# Patient Record
Sex: Male | Born: 1960
Health system: Southern US, Community
[De-identification: ages and names within clinical notes are randomized; demographics above are authoritative.]

## PROBLEM LIST (undated history)

## (undated) ENCOUNTER — Emergency Department (HOSPITAL_BASED_OUTPATIENT_CLINIC_OR_DEPARTMENT_OTHER): Payer: Self-pay | Source: Home / Self Care

## (undated) DIAGNOSIS — E119 Type 2 diabetes mellitus without complications: Secondary | ICD-10-CM

## (undated) DIAGNOSIS — I5022 Chronic systolic (congestive) heart failure: Secondary | ICD-10-CM

## (undated) DIAGNOSIS — I214 Non-ST elevation (NSTEMI) myocardial infarction: Secondary | ICD-10-CM

## (undated) DIAGNOSIS — K219 Gastro-esophageal reflux disease without esophagitis: Secondary | ICD-10-CM

## (undated) DIAGNOSIS — Z95810 Presence of automatic (implantable) cardiac defibrillator: Secondary | ICD-10-CM

## (undated) DIAGNOSIS — J189 Pneumonia, unspecified organism: Secondary | ICD-10-CM

## (undated) DIAGNOSIS — K8020 Calculus of gallbladder without cholecystitis without obstruction: Secondary | ICD-10-CM

## (undated) DIAGNOSIS — I10 Essential (primary) hypertension: Secondary | ICD-10-CM

## (undated) DIAGNOSIS — E7800 Pure hypercholesterolemia, unspecified: Secondary | ICD-10-CM

## (undated) DIAGNOSIS — I2510 Atherosclerotic heart disease of native coronary artery without angina pectoris: Secondary | ICD-10-CM

## (undated) DIAGNOSIS — N200 Calculus of kidney: Secondary | ICD-10-CM

---

## 1998-03-21 ENCOUNTER — Emergency Department (HOSPITAL_COMMUNITY): Admission: EM | Admit: 1998-03-21 | Discharge: 1998-03-21 | Payer: Self-pay | Admitting: Emergency Medicine

## 1998-03-22 ENCOUNTER — Emergency Department (HOSPITAL_COMMUNITY): Admission: EM | Admit: 1998-03-22 | Discharge: 1998-03-22 | Payer: Self-pay | Admitting: Emergency Medicine

## 1998-03-23 ENCOUNTER — Emergency Department (HOSPITAL_COMMUNITY): Admission: EM | Admit: 1998-03-23 | Discharge: 1998-03-23 | Payer: Self-pay | Admitting: Emergency Medicine

## 1999-10-04 ENCOUNTER — Emergency Department (HOSPITAL_COMMUNITY): Admission: EM | Admit: 1999-10-04 | Discharge: 1999-10-04 | Payer: Self-pay | Admitting: *Deleted

## 1999-10-13 ENCOUNTER — Emergency Department (HOSPITAL_COMMUNITY): Admission: EM | Admit: 1999-10-13 | Discharge: 1999-10-14 | Payer: Self-pay | Admitting: Emergency Medicine

## 1999-10-13 ENCOUNTER — Encounter: Payer: Self-pay | Admitting: Emergency Medicine

## 1999-10-27 ENCOUNTER — Encounter: Admission: RE | Admit: 1999-10-27 | Discharge: 1999-11-17 | Payer: Self-pay | Admitting: Family Medicine

## 2000-06-20 ENCOUNTER — Encounter: Payer: Self-pay | Admitting: Family Medicine

## 2000-06-20 ENCOUNTER — Ambulatory Visit (HOSPITAL_COMMUNITY): Admission: RE | Admit: 2000-06-20 | Discharge: 2000-06-20 | Payer: Self-pay | Admitting: Family Medicine

## 2000-08-02 DIAGNOSIS — J189 Pneumonia, unspecified organism: Secondary | ICD-10-CM

## 2000-08-02 HISTORY — DX: Pneumonia, unspecified organism: J18.9

## 2000-10-12 ENCOUNTER — Inpatient Hospital Stay (HOSPITAL_COMMUNITY): Admission: EM | Admit: 2000-10-12 | Discharge: 2000-10-14 | Payer: Self-pay | Admitting: Emergency Medicine

## 2000-10-12 ENCOUNTER — Encounter: Payer: Self-pay | Admitting: Emergency Medicine

## 2000-10-14 ENCOUNTER — Encounter: Payer: Self-pay | Admitting: Internal Medicine

## 2001-08-24 ENCOUNTER — Emergency Department (HOSPITAL_COMMUNITY): Admission: EM | Admit: 2001-08-24 | Discharge: 2001-08-24 | Payer: Self-pay

## 2003-04-21 ENCOUNTER — Emergency Department (HOSPITAL_COMMUNITY): Admission: EM | Admit: 2003-04-21 | Discharge: 2003-04-21 | Payer: Self-pay | Admitting: Emergency Medicine

## 2005-07-04 ENCOUNTER — Emergency Department (HOSPITAL_COMMUNITY): Admission: EM | Admit: 2005-07-04 | Discharge: 2005-07-04 | Payer: Self-pay | Admitting: Diagnostic Radiology

## 2008-05-21 ENCOUNTER — Emergency Department (HOSPITAL_COMMUNITY): Admission: EM | Admit: 2008-05-21 | Discharge: 2008-05-21 | Payer: Self-pay | Admitting: Emergency Medicine

## 2011-05-03 LAB — SEDIMENTATION RATE: Sed Rate: 3

## 2011-05-03 LAB — BASIC METABOLIC PANEL
BUN: 22
Chloride: 101
Creatinine, Ser: 0.85
GFR calc Af Amer: >60
GFR calc non Af Amer: >60
Potassium: 5.2 — ABNORMAL HIGH

## 2011-05-03 LAB — GLUCOSE, CAPILLARY
Glucose-Capillary: 251 — ABNORMAL HIGH
Glucose-Capillary: 261 — ABNORMAL HIGH

## 2012-12-30 ENCOUNTER — Emergency Department (HOSPITAL_BASED_OUTPATIENT_CLINIC_OR_DEPARTMENT_OTHER): Payer: Self-pay

## 2012-12-30 ENCOUNTER — Emergency Department (HOSPITAL_BASED_OUTPATIENT_CLINIC_OR_DEPARTMENT_OTHER)
Admission: EM | Admit: 2012-12-30 | Discharge: 2012-12-30 | Disposition: A | Discharge status: Home / Self Care | Payer: Self-pay | Attending: Emergency Medicine | Admitting: Emergency Medicine

## 2012-12-30 ENCOUNTER — Encounter (HOSPITAL_BASED_OUTPATIENT_CLINIC_OR_DEPARTMENT_OTHER): Payer: Self-pay | Admitting: Emergency Medicine

## 2012-12-30 DIAGNOSIS — J189 Pneumonia, unspecified organism: Secondary | ICD-10-CM

## 2012-12-30 DIAGNOSIS — R066 Hiccough: Secondary | ICD-10-CM

## 2012-12-30 DIAGNOSIS — R059 Cough, unspecified: Secondary | ICD-10-CM | POA: Insufficient documentation

## 2012-12-30 DIAGNOSIS — J3489 Other specified disorders of nose and nasal sinuses: Secondary | ICD-10-CM | POA: Insufficient documentation

## 2012-12-30 DIAGNOSIS — E119 Type 2 diabetes mellitus without complications: Secondary | ICD-10-CM | POA: Insufficient documentation

## 2012-12-30 DIAGNOSIS — J309 Allergic rhinitis, unspecified: Secondary | ICD-10-CM | POA: Insufficient documentation

## 2012-12-30 DIAGNOSIS — E7800 Pure hypercholesterolemia, unspecified: Secondary | ICD-10-CM | POA: Insufficient documentation

## 2012-12-30 DIAGNOSIS — I10 Essential (primary) hypertension: Secondary | ICD-10-CM | POA: Insufficient documentation

## 2012-12-30 DIAGNOSIS — R05 Cough: Secondary | ICD-10-CM | POA: Insufficient documentation

## 2012-12-30 DIAGNOSIS — Z79899 Other long term (current) drug therapy: Secondary | ICD-10-CM | POA: Insufficient documentation

## 2012-12-30 DIAGNOSIS — Z7982 Long term (current) use of aspirin: Secondary | ICD-10-CM | POA: Insufficient documentation

## 2012-12-30 HISTORY — DX: Pure hypercholesterolemia, unspecified: E78.00

## 2012-12-30 HISTORY — DX: Essential (primary) hypertension: I10

## 2012-12-30 MED ORDER — AZITHROMYCIN 250 MG PO TABS: 250.0000 mg | ORAL_TABLET | Freq: Every day | ORAL | Status: DC

## 2012-12-30 MED ORDER — CHLORPROMAZINE HCL 25 MG PO TABS: 25.0000 mg | ORAL_TABLET | Freq: Three times a day (TID) | ORAL | Status: DC

## 2012-12-30 NOTE — ED Notes (Signed)
Pt c/o hiccups x 4 days with episodes lasting 5+ hours at a time. Pt reports saw Dr. Lovell Sheehan and was told to come here for X-ray.

## 2012-12-30 NOTE — ED Provider Notes (Addendum)
History  This chart was scribed for Benjamin Lyons, MD by Ardelia Mems, ED Scribe. This patient was seen in room MH07/MH07 and the patient's care was started at 4:25 PM.   CSN: [id number]  Arrival date & time 12/30/12  1519      Chief Complaint  Patient presents with  . Hiccups     The history is provided by the patient. No language interpreter was used.    HPI Comments: Benjamin [name] is a 52 y.o. male with a h/o DM and HTN who presents to the Emergency Department complaining of 4 days of intermittent, moderate episodes of hiccups which have worsened in the past 24 hours. Episodes occur constantly for about 4-5 hours and go away for 6-7 hours. Pt states that the hiccups wake him in the morning on occasion. Pt states that eating and exercising have caused the hiccups to go away temporarily. Pt states that his food which he ate earlier today feels like it is still in his thoat. Pt states that he has also been having a moderate cough and seasonal allergies. Pt denies any past history of smoking or drinking. Pt reports saw Dr. Lovell Sheehan and was told to come here for X-ray.     Past Medical History  Diagnosis Date  . Hypertension   . Diabetes mellitus without complication   . High cholesterol     History reviewed. No pertinent past surgical history.  No family history on file.  History  Substance Use Topics  . Smoking status: Never Smoker   . Smokeless tobacco: Not on file  . Alcohol Use: No      Review of Systems  Constitutional: Negative for fever and chills.  HENT: Positive for congestion and rhinorrhea.   Respiratory: Positive for cough. Negative for choking and shortness of breath.   Cardiovascular: Negative for chest pain.  Gastrointestinal: Negative for vomiting and diarrhea.  All other systems reviewed and are negative.    Allergies  Review of patient's allergies indicates no known allergies.  Home Medications   Current Outpatient Rx  Name  Route  Sig  Dispense   Refill  . Albuterol (PROVENTIL IN)   Inhalation   Inhale into the lungs.         Marland Kitchen aspirin 81 MG tablet   Oral   Take 81 mg by mouth daily.         Marland Kitchen atorvastatin (LIPITOR) 20 MG tablet   Oral   Take 20 mg by mouth daily.         . carvedilol (COREG) 25 MG tablet   Oral   Take 25 mg by mouth 2 (two) times daily with a meal.         . desloratadine (CLARINEX) 5 MG tablet   Oral   Take 5 mg by mouth daily.         Marland Kitchen gabapentin (NEURONTIN) 300 MG capsule   Oral   Take 300 mg by mouth at bedtime.         Marland Kitchen ibuprofen (ADVIL,MOTRIN) 800 MG tablet   Oral   Take 800 mg by mouth every 8 (eight) hours as needed for pain.         . Investigational everolimus (RAD001) 5 MG tablet Novartis [id number]   Oral   Take 2 tablets by mouth daily. Take with a glass of water.         . valsartan (DIOVAN) 160 MG tablet   Oral   Take 160 mg by mouth  2 (two) times daily.           There were no vitals taken for this visit.  Physical Exam  Nursing note and vitals reviewed. Constitutional: He is oriented to person, place, and time. He appears well-developed and well-nourished.  HENT:  Head: Normocephalic and atraumatic.  Mouth/Throat: Oropharynx is clear and moist.  Eyes: EOM are normal. Pupils are equal, round, and reactive to light.  Neck: Normal range of motion. No tracheal deviation present.  Cardiovascular: Normal rate, regular rhythm and normal heart sounds.   Pulmonary/Chest: Effort normal and breath sounds normal. No respiratory distress.  Abdominal: Soft. There is no tenderness.  Musculoskeletal: Normal range of motion. He exhibits no tenderness.  Neurological: He is alert and oriented to person, place, and time.  Skin: Skin is warm. No rash noted.  Psychiatric: He has a normal mood and affect.    ED Course  Procedures (including critical care time)  DIAGNOSTIC STUDIES:  COORDINATION OF CARE: 4:31 PM- Pt advised of plan for treatment and pt  agrees.     Labs Reviewed - No data to display Dg Chest 2 View  12/30/2012   *RADIOLOGY REPORT*  Clinical Data: Hiccups  CHEST - 2 VIEW  Comparison: None.  Findings: Mild patchy opacity in the left mid lung, pneumonia not excluded.  No pleural effusion or pneumothorax.  Cardiomegaly.  Mild degenerative changes of the visualized thoracolumbar spine.  IMPRESSION: Mild patchy opacity in the left mid lung, pneumonia not excluded.   Original Report Authenticated By: Charline Bills, M.D.     1. Hiccups   2. Community acquired pneumonia       MDM  Patient with persistent hiccups for four days.  Chest xray is suggestive of pneumonia and will treat with antibiotics and trazodone.  If he does not improve he is to follow up with his pcp (Dr. Lovell Sheehan) to discuss a chest ct.  To return to the ED prn.          I personally performed the services described in this documentation, which was scribed in my presence. The recorded information has been reviewed and is accurate.      Benjamin Lyons, MD 12/31/12 [id number]  Benjamin Lyons, MD 12/31/12 [phone]

## 2013-04-09 ENCOUNTER — Ambulatory Visit: Payer: No Typology Code available for payment source | Attending: Family Medicine | Admitting: Internal Medicine

## 2013-04-09 ENCOUNTER — Encounter: Payer: Self-pay | Admitting: Internal Medicine

## 2013-04-09 VITALS — BP 166/108 | HR 88 | Temp 98.7°F | Resp 16 | Ht 67.72 in | Wt 246.0 lb

## 2013-04-09 DIAGNOSIS — E785 Hyperlipidemia, unspecified: Secondary | ICD-10-CM

## 2013-04-09 DIAGNOSIS — Z0000 Encounter for general adult medical examination without abnormal findings: Secondary | ICD-10-CM | POA: Insufficient documentation

## 2013-04-09 DIAGNOSIS — E119 Type 2 diabetes mellitus without complications: Secondary | ICD-10-CM | POA: Insufficient documentation

## 2013-04-09 DIAGNOSIS — K5900 Constipation, unspecified: Secondary | ICD-10-CM | POA: Insufficient documentation

## 2013-04-09 DIAGNOSIS — I10 Essential (primary) hypertension: Secondary | ICD-10-CM

## 2013-04-09 DIAGNOSIS — Z23 Encounter for immunization: Secondary | ICD-10-CM

## 2013-04-09 MED ORDER — SENNA 8.6 MG PO TABS: 2.0000 | ORAL_TABLET | Freq: Every day | ORAL | Status: DC

## 2013-04-09 MED ORDER — INSULIN ASPART 100 UNIT/ML ~~LOC~~ SOLN: 15.0000 [IU] | Freq: Two times a day (BID) | SUBCUTANEOUS | Status: DC

## 2013-04-09 MED ORDER — VALSARTAN 160 MG PO TABS: 320.0000 mg | ORAL_TABLET | Freq: Every day | ORAL | Status: DC

## 2013-04-09 MED ORDER — POLYETHYLENE GLYCOL 3350 17 GM/SCOOP PO POWD: 17.0000 g | Freq: Two times a day (BID) | ORAL | Status: DC | PRN

## 2013-04-09 MED ORDER — INSULIN GLARGINE 100 UNIT/ML ~~LOC~~ SOLN: 25.0000 [IU] | Freq: Every day | SUBCUTANEOUS | Status: DC

## 2013-04-09 MED ORDER — CARVEDILOL 25 MG PO TABS: 25.0000 mg | ORAL_TABLET | Freq: Two times a day (BID) | ORAL | Status: DC

## 2013-04-09 MED ORDER — GABAPENTIN 300 MG PO CAPS: 300.0000 mg | ORAL_CAPSULE | Freq: Every day | ORAL | Status: DC

## 2013-04-09 MED ORDER — ATORVASTATIN CALCIUM 20 MG PO TABS: 20.0000 mg | ORAL_TABLET | Freq: Every day | ORAL | Status: DC

## 2013-04-09 NOTE — Progress Notes (Signed)
Patient Demographics  Benjamin [name], is a 52 y.o. male  HQI:[id number]  XLK:[id number]  DOB - 06/11/1961  Chief Complaint  Patient presents with  . Establish Care        Subjective:   [name] today is here to establish primary care. Patient has a past medical history of hypertension, diabetes and dyslipidemia. He is here to establish medical care. His main complaint here today is of constipation but he has been having for the past 10 days. Currently patient has no other complaints complaints. Patient has also has No headache, No chest pain, No abdominal pain,No Nausea, No new weakness tingling or numbness, No Cough or SOB.  Objective:    Filed Vitals:   04/09/13 1747  BP: 166/108  Pulse: 88  Temp: 98.7 F (37.1 C)  TempSrc: Oral  Resp: 16  Height: 5' 7.72" (1.72 m)  Weight: 246 lb (111.585 kg)  SpO2: 96%     ALLERGIES:  No Known Allergies  PAST MEDICAL HISTORY: Past Medical History  Diagnosis Date  . Hypertension   . Diabetes mellitus without complication   . High cholesterol     PAST SURGICAL HISTORY: History reviewed. No pertinent past surgical history.  FAMILY HISTORY: No family history on file.  MEDICATIONS AT HOME: Prior to Admission medications   Medication Sig Start Date End Date Taking? Authorizing Provider  Albuterol (PROVENTIL IN) Inhale into the lungs.    Historical Provider, MD  aspirin 81 MG tablet Take 81 mg by mouth daily.    Historical Provider, MD  atorvastatin (LIPITOR) 20 MG tablet Take 1 tablet (20 mg total) by mouth daily. 04/09/13   Carrine Kroboth Levora Dredge, MD  azithromycin (ZITHROMAX) 250 MG tablet Take 1 tablet (250 mg total) by mouth daily. Take first 2 tablets together, then 1 every day until finished. 12/30/12   [name], MD  carvedilol (COREG) 25 MG tablet Take 1 tablet (25 mg total) by mouth 2 (two) times daily with a meal. 04/09/13   Maretta Bees, MD  gabapentin (NEURONTIN) 300 MG capsule Take 1 capsule (300 mg total) by mouth  at bedtime. 04/09/13   Carrson Lightcap Levora Dredge, MD  insulin aspart (NOVOLOG) 100 UNIT/ML injection Inject 15 Units into the skin 2 (two) times daily with a meal. 04/09/13   Maretta Bees, MD  insulin glargine (LANTUS) 100 UNIT/ML injection Inject 0.25 mLs (25 Units total) into the skin at bedtime. 04/09/13   Gailene Youkhana Levora Dredge, MD  valsartan (DIOVAN) 160 MG tablet Take 2 tablets (320 mg total) by mouth daily. 04/09/13   Shakeitha Umbaugh Levora Dredge, MD    SOCIAL HISTORY:   reports that he has never smoked. He does not have any smokeless tobacco history on file. He reports that he does not drink alcohol or use illicit drugs.  REVIEW OF SYSTEMS:  Constitutional:   No   Fevers, chills, fatigue.  HEENT:    No headaches, Sore throat,   Cardio-vascular: No chest pain,  Orthopnea, swelling in lower extremities, anasarca, palpitations  GI:  No abdominal pain, nausea, vomiting, diarrhea  Resp: No shortness of breath,  No coughing up of blood.No cough.No wheezing.  Skin:  no rash or lesions.  GU:  no dysuria, change in color of urine, no urgency or frequency.  No flank pain.  Musculoskeletal: No joint pain or swelling.  No decreased range of motion.  No back pain.  Psych: No change in mood or affect. No depression or anxiety.  No memory loss.   Exam  General appearance :Awake, alert, not in any distress. Speech Clear. Not toxic Looking HEENT: Atraumatic and Normocephalic, pupils equally reactive to light and accomodation Neck: supple, no JVD. No cervical lymphadenopathy.  Chest:Good air entry bilaterally, no added sounds  CVS: S1 S2 regular, no murmurs.  Abdomen: Bowel sounds present, Non tender and not distended with no gaurding, rigidity or rebound. Extremities: B/L Lower Ext shows no edema, both legs are warm to touch Neurology: Awake alert, and oriented X 3, CN II-XII intact, Non focal Skin:No Rash Wounds:N/A    Data Review   CBC No results found for this basename: WBC, HGB, HCT, PLT,  MCV, MCH, MCHC, RDW, NEUTRABS, LYMPHSABS, MONOABS, EOSABS, BASOSABS, BANDABS, BANDSABD,  in the last 168 hours  Chemistries   No results found for this basename: NA, K, CL, CO2, GLUCOSE, BUN, CREATININE, GFRCGP, CALCIUM, MG, AST, ALT, ALKPHOS, BILITOT,  in the last 168 hours ------------------------------------------------------------------------------------------------------------------ No results found for this basename: HGBA1C,  in the last 72 hours ------------------------------------------------------------------------------------------------------------------ No results found for this basename: CHOL, HDL, LDLCALC, TRIG, CHOLHDL, LDLDIRECT,  in the last 72 hours ------------------------------------------------------------------------------------------------------------------ No results found for this basename: TSH, T4TOTAL, FREET3, T3FREE, THYROIDAB,  in the last 72 hours ------------------------------------------------------------------------------------------------------------------ No results found for this basename: VITAMINB12, FOLATE, FERRITIN, TIBC, IRON, RETICCTPCT,  in the last 72 hours  Coagulation profile  No results found for this basename: INR, PROTIME,  in the last 168 hours    Assessment & Plan   Hypertension - Uncontrolled - Change Diovan to 320 mg daily, change to 25 mg twice a day, continue HCTZ 25 mg daily - Reassess at next visit  Diabetes - Continue with Lantus 25 units each bedtime, and 15 units twice a day with meals - Check A1c -  patient to bring CBG diary next visit  Dyslipidemia - Check lipid panel, continue Lipitor  Constipation - Senokot each bedtime, and MiraLax when necessary - Reassess next visit  Health Maintenance -Colonoscopy: Ordered today -Vaccinations: Flu vaccine today  Follow in 2 weeks-please followup labs-CBC, cemented, TSH, A1c and lipid panel  The patient was given clear instructions to go to ER or return to medical center  if symptoms don't improve, worsen or new problems develop. The patient verbalized understanding. The patient was told to call to get lab results if they haven't heard anything in the next week.

## 2013-04-09 NOTE — Progress Notes (Signed)
PT IS TO COME BACK IN A WEEK FOR LABS AND A WEEK AFTER TO REVIEW THE LAB RESULTS.

## 2013-04-09 NOTE — Progress Notes (Signed)
Pt is here to establish care. For 10 days pt is having trouble having bowl moments. Pt reports not having a feeling to go to the bathroom. When he thinks he needs to go he just passes gas. Pt is taking magnesium to help but does not help much.

## 2013-04-10 ENCOUNTER — Ambulatory Visit: Payer: No Typology Code available for payment source | Attending: Internal Medicine

## 2013-04-10 ENCOUNTER — Other Ambulatory Visit: Payer: No Typology Code available for payment source

## 2013-04-10 VITALS — BP 137/87 | HR 67 | Temp 98.4°F | Resp 16 | Ht 67.32 in | Wt 242.0 lb

## 2013-04-10 DIAGNOSIS — E119 Type 2 diabetes mellitus without complications: Secondary | ICD-10-CM | POA: Insufficient documentation

## 2013-04-10 LAB — CBC
MCH: 26.8 pg (ref 26.0–34.0)
MCHC: 34.7 g/dL (ref 30.0–36.0)
Platelets: 193 10*3/uL (ref 150–400)

## 2013-04-10 LAB — HEMOGLOBIN A1C
Hgb A1c MFr Bld: 8.7 % — ABNORMAL HIGH (ref <5.7)
Mean Plasma Glucose: 203 mg/dL — ABNORMAL HIGH (ref <117)

## 2013-04-10 NOTE — Progress Notes (Unsigned)
Pt is here for labs only.

## 2013-04-11 LAB — COMPREHENSIVE METABOLIC PANEL
ALT: 27 U/L (ref 0–53)
AST: 18 U/L (ref 0–37)
Albumin: 4.1 g/dL (ref 3.5–5.2)
CO2: 28 mEq/L (ref 19–32)
Calcium: 9.5 mg/dL (ref 8.4–10.5)
Chloride: 103 mEq/L (ref 96–112)
Creat: 1 mg/dL (ref 0.50–1.35)
Potassium: 4.7 mEq/L (ref 3.5–5.3)
Sodium: 141 mEq/L (ref 135–145)
Total Protein: 6.5 g/dL (ref 6.0–8.3)

## 2013-04-11 LAB — LIPID PANEL
Cholesterol: 150 mg/dL (ref 0–200)
Triglycerides: 64 mg/dL (ref <150)

## 2013-04-16 ENCOUNTER — Other Ambulatory Visit: Payer: No Typology Code available for payment source

## 2013-04-18 ENCOUNTER — Encounter: Payer: Self-pay | Admitting: Cardiology

## 2013-04-18 ENCOUNTER — Ambulatory Visit: Payer: No Typology Code available for payment source | Attending: Cardiology | Admitting: Cardiology

## 2013-04-18 VITALS — BP 153/93 | HR 74 | Temp 98.7°F | Resp 16 | Ht 67.0 in | Wt 243.0 lb

## 2013-04-18 DIAGNOSIS — I10 Essential (primary) hypertension: Secondary | ICD-10-CM | POA: Insufficient documentation

## 2013-04-18 DIAGNOSIS — E119 Type 2 diabetes mellitus without complications: Secondary | ICD-10-CM | POA: Insufficient documentation

## 2013-04-18 DIAGNOSIS — E785 Hyperlipidemia, unspecified: Secondary | ICD-10-CM | POA: Insufficient documentation

## 2013-04-18 DIAGNOSIS — K5900 Constipation, unspecified: Secondary | ICD-10-CM

## 2013-04-18 DIAGNOSIS — Z794 Long term (current) use of insulin: Secondary | ICD-10-CM | POA: Insufficient documentation

## 2013-04-18 DIAGNOSIS — Z79899 Other long term (current) drug therapy: Secondary | ICD-10-CM | POA: Insufficient documentation

## 2013-04-18 DIAGNOSIS — E1165 Type 2 diabetes mellitus with hyperglycemia: Secondary | ICD-10-CM | POA: Insufficient documentation

## 2013-04-18 MED ORDER — METFORMIN HCL 1000 MG PO TABS: 1000.0000 mg | ORAL_TABLET | Freq: Two times a day (BID) | ORAL | Status: DC

## 2013-04-18 NOTE — Assessment & Plan Note (Signed)
Continue stool softener. Continue exercise. Better blood sugar control will improve bowel function. We will schedule for screening colonoscopy since he's 52 years of age.

## 2013-04-18 NOTE — Progress Notes (Signed)
PT ORDERED METFORMIN  3 MNTH F/U WITH PCP GIVEN COLONOSCOPY SCHEDULED

## 2013-04-18 NOTE — Progress Notes (Signed)
Mr Cundari returns today for followup of his inadequately controlled blood pressure, insulin-dependent diabetes, hyperlipidemia. Please see previous note for adjustments made.  His blood work was remarkable for a hemoglobin A1c of 8.7%. Lipids were at goal. Other blood work was unremarkable.  He has been  taking his medications for blood pressure the way he wants to take them as opposed to whether prescribed. Specifically, he is dividing his Diovan dose into twice a day. He is also taking only a half a tablet of carvedilol instead of a full tablet. He is also taking his hydrochlorothiazide at night instead of the morning.  His diet is very high in carbohydrates with rice and bread. He does exercise 3 times a week. He denies any symptoms of angina or ischemia.  His physical examination is unchanged.  No other ancillary studies ordered today.

## 2013-04-18 NOTE — Progress Notes (Signed)
Patient here f/u HTN,DIABETES TAKING PRESCRIBED MEDS AND INSULIN DENIES CP OR SOB CONSTIPATION

## 2013-04-18 NOTE — Assessment & Plan Note (Signed)
I have reviewed at length how he should appropriately take his medications. His blood pressures better today but still not adequate control. I think this is because he's not taking them as prescribed. He will continue exercise 3 times a week and try to lose weight by decreasing his carbohydrate intake. This hopefully will also help his hemoglobin A1c which we'll recheck in 3 months.

## 2013-04-18 NOTE — Assessment & Plan Note (Signed)
Continue Lipitor as prescribed. Repeat labs in a year. He is at goal with his LDL.

## 2013-04-19 ENCOUNTER — Telehealth: Payer: Self-pay | Admitting: Emergency Medicine

## 2013-04-19 NOTE — Telephone Encounter (Signed)
PT LEFT MESSAGE ON VOICEMAIL THAT NORA WILL CALL WITH APPT FOR COLONOSCOPY.

## 2013-04-23 ENCOUNTER — Ambulatory Visit: Payer: No Typology Code available for payment source | Admitting: Internal Medicine

## 2013-04-27 ENCOUNTER — Telehealth: Payer: Self-pay | Admitting: Internal Medicine

## 2013-04-27 NOTE — Telephone Encounter (Signed)
Medication not on pt file. Will need to schedule appt.

## 2013-07-02 ENCOUNTER — Encounter: Payer: Self-pay | Admitting: Internal Medicine

## 2013-07-10 ENCOUNTER — Encounter: Payer: Self-pay | Admitting: Cardiology

## 2013-07-11 ENCOUNTER — Other Ambulatory Visit: Payer: Self-pay | Admitting: Gastroenterology

## 2013-07-11 NOTE — Addendum Note (Signed)
Addended by: Charlott Rakes on: 07/11/2013 04:06 PM   Modules accepted: Orders

## 2013-07-12 ENCOUNTER — Encounter (HOSPITAL_COMMUNITY)
Admission: RE | Disposition: A | Discharge status: Home / Self Care | Payer: Self-pay | Source: Ambulatory Visit | Attending: Gastroenterology

## 2013-07-12 ENCOUNTER — Encounter (HOSPITAL_COMMUNITY): Payer: Self-pay

## 2013-07-12 ENCOUNTER — Ambulatory Visit (HOSPITAL_COMMUNITY)
Admission: RE | Admit: 2013-07-12 | Discharge: 2013-07-12 | Disposition: A | Discharge status: Home / Self Care | Payer: No Typology Code available for payment source | Source: Ambulatory Visit | Attending: Gastroenterology | Admitting: Gastroenterology

## 2013-07-12 DIAGNOSIS — K648 Other hemorrhoids: Secondary | ICD-10-CM | POA: Insufficient documentation

## 2013-07-12 DIAGNOSIS — R194 Change in bowel habit: Secondary | ICD-10-CM | POA: Diagnosis present

## 2013-07-12 DIAGNOSIS — K5900 Constipation, unspecified: Secondary | ICD-10-CM | POA: Insufficient documentation

## 2013-07-12 DIAGNOSIS — R198 Other specified symptoms and signs involving the digestive system and abdomen: Secondary | ICD-10-CM | POA: Insufficient documentation

## 2013-07-12 HISTORY — PX: COLONOSCOPY: SHX5424

## 2013-07-12 LAB — GLUCOSE, CAPILLARY: Glucose-Capillary: 93 mg/dL (ref 70–99)

## 2013-07-12 SURGERY — COLONOSCOPY
Anesthesia: Moderate Sedation

## 2013-07-12 MED ORDER — MIDAZOLAM HCL 10 MG/2ML IJ SOLN
INTRAMUSCULAR | Status: DC | PRN
  Administered 2013-07-12 (×2): 2 mg via INTRAVENOUS

## 2013-07-12 MED ORDER — FENTANYL CITRATE 0.05 MG/ML IJ SOLN
INTRAMUSCULAR | Status: AC
  Filled 2013-07-12: qty 2

## 2013-07-12 MED ORDER — MIDAZOLAM HCL 10 MG/2ML IJ SOLN
INTRAMUSCULAR | Status: AC
  Filled 2013-07-12: qty 2

## 2013-07-12 MED ORDER — DIPHENHYDRAMINE HCL 50 MG/ML IJ SOLN
INTRAMUSCULAR | Status: AC
  Filled 2013-07-12: qty 1

## 2013-07-12 MED ORDER — SODIUM CHLORIDE 0.9 % IV SOLN
INTRAVENOUS | Status: DC
  Administered 2013-07-12: 09:00:00 via INTRAVENOUS

## 2013-07-12 MED ORDER — FENTANYL CITRATE 0.05 MG/ML IJ SOLN
INTRAMUSCULAR | Status: DC | PRN
  Administered 2013-07-12 (×2): 25 ug via INTRAVENOUS

## 2013-07-12 NOTE — Op Note (Signed)
[facility] [address] [place], [address]   COLONOSCOPY PROCEDURE REPORT  PATIENT: Benjamin, [name]  MR#: [id number] BIRTHDATE: 10-12-1960 , 52  yrs. old GENDER: Male ENDOSCOPIST: Charlott Rakes, MD REFERRED BY: PROCEDURE DATE:  07/12/2013 PROCEDURE:   Colonoscopy, diagnostic ASA CLASS:   Class II INDICATIONS:Change in bowel habits and Constipation. MEDICATIONS: Fentanyl 50 mcg IV and Versed 5 mg IV  DESCRIPTION OF PROCEDURE:   After the risks benefits and alternatives of the procedure were thoroughly explained, informed consent was obtained.  The Pentax Ped Colon [id number]  endoscope was introduced through the anus and advanced to the cecum, which was identified by both the appendix and ileocecal valve , limited by No adverse events experienced.   The quality of the prep was    .  The instrument was then slowly withdrawn as the colon was fully examined.     FINDINGS:  Rectal exam unremarkable.  Pediatric colonoscope inserted into the colon and advanced to the cecum, where the appendiceal orifice and ileocecal valve were identified.  The terminal ileum was intubated and was normal in appearance.  On careful withdrawal of the colonoscope no mucosal abnormalities were seen. Retroflexion revealed small internal hemorrhoids.  COMPLICATIONS: None  IMPRESSION:     Small internal hemorrhoids o/w normal colonoscopy   RECOMMENDATIONS: Repeat colonoscopy in 10 years; Miralax QD prn    ______________________________ eSignedCharlott Rakes, MD 07/12/2013 10:15 AM   CC:

## 2013-07-12 NOTE — Interval H&P Note (Signed)
History and Physical Interval Note:  07/12/2013 9:47 AM  Benjamin [name]  has presented today for surgery, with the diagnosis of change in bowel  The various methods of treatment have been discussed with the patient and family. After consideration of risks, benefits and other options for treatment, the patient has consented to  Procedure(s): COLONOSCOPY (N/A) as a surgical intervention .  The patient's history has been reviewed, patient examined, no change in status, stable for surgery.  I have reviewed the patient's chart and labs.  Questions were answered to the patient's satisfaction.     [name] C.

## 2013-07-12 NOTE — Interval H&P Note (Signed)
History and Physical Interval Note:  07/12/2013 9:47 AM  Benjamin [name]  has presented today for surgery, with the diagnosis of change in bowel  The various methods of treatment have been discussed with the patient and family. After consideration of risks, benefits and other options for treatment, the patient has consented to  Procedure(s): COLONOSCOPY (N/A) as a surgical intervention .  The patient's history has been reviewed, patient examined, no change in status, stable for surgery.  I have reviewed the patient's chart and labs.  Questions were answered to the patient's satisfaction.     King Pinzon C.   

## 2013-07-12 NOTE — H&P (Signed)
  Date of Initial H&P: 07/06/13  History reviewed, patient examined, no change in status, stable for surgery. 

## 2013-07-13 ENCOUNTER — Encounter (HOSPITAL_COMMUNITY): Payer: Self-pay | Admitting: Gastroenterology

## 2013-07-31 ENCOUNTER — Ambulatory Visit: Payer: No Typology Code available for payment source | Attending: Internal Medicine

## 2013-07-31 DIAGNOSIS — E1110 Type 2 diabetes mellitus with ketoacidosis without coma: Secondary | ICD-10-CM

## 2013-07-31 DIAGNOSIS — E1310 Other specified diabetes mellitus with ketoacidosis without coma: Secondary | ICD-10-CM

## 2013-07-31 LAB — LIPID PANEL
Cholesterol: 153 mg/dL (ref 0–200)
HDL: 32 mg/dL — ABNORMAL LOW (ref >39)
LDL Cholesterol: 99 mg/dL (ref 0–99)
VLDL: 22 mg/dL (ref 0–40)

## 2013-07-31 LAB — POCT GLYCOSYLATED HEMOGLOBIN (HGB A1C): Hemoglobin A1C: 7.2

## 2013-07-31 NOTE — Progress Notes (Signed)
Pt here for INR

## 2013-08-07 ENCOUNTER — Ambulatory Visit: Payer: No Typology Code available for payment source | Attending: Internal Medicine | Admitting: Internal Medicine

## 2013-08-07 ENCOUNTER — Encounter: Payer: Self-pay | Admitting: Internal Medicine

## 2013-08-07 VITALS — BP 124/82 | HR 74 | Temp 98.4°F | Resp 16 | Ht 68.0 in | Wt 241.0 lb

## 2013-08-07 DIAGNOSIS — E785 Hyperlipidemia, unspecified: Secondary | ICD-10-CM | POA: Insufficient documentation

## 2013-08-07 DIAGNOSIS — K219 Gastro-esophageal reflux disease without esophagitis: Secondary | ICD-10-CM

## 2013-08-07 DIAGNOSIS — E1110 Type 2 diabetes mellitus with ketoacidosis without coma: Secondary | ICD-10-CM | POA: Insufficient documentation

## 2013-08-07 DIAGNOSIS — I10 Essential (primary) hypertension: Secondary | ICD-10-CM | POA: Insufficient documentation

## 2013-08-07 DIAGNOSIS — E1310 Other specified diabetes mellitus with ketoacidosis without coma: Secondary | ICD-10-CM

## 2013-08-07 DIAGNOSIS — E119 Type 2 diabetes mellitus without complications: Secondary | ICD-10-CM | POA: Insufficient documentation

## 2013-08-07 LAB — GLUCOSE, POCT (MANUAL RESULT ENTRY): POC Glucose: 231 mg/dl — AB (ref 70–99)

## 2013-08-07 MED ORDER — ESOMEPRAZOLE MAGNESIUM 40 MG PO CPDR: 40.0000 mg | DELAYED_RELEASE_CAPSULE | Freq: Every day | ORAL | Status: DC

## 2013-08-07 NOTE — Progress Notes (Signed)
Patient ID: Benjamin [name], male   DOB: 1960-11-07, 53 y.o.   MRN: [id number] Patient Demographics  Benjamin [name], is a 53 y.o. male  FGH:[id number]  CVE:[id number]  DOB - 10/08/1960  Chief Complaint  Patient presents with  . Follow-up        Subjective:   Benjamin [name] is a 53 y.o. male here today for a follow up visit. Patient has no complaint. He has all his medications but he takes the medications the way he wants and not the way they're prescribed, for example he takes his Benicar half tablet in the morning and half in the evening, and carvedilol he takes occasionally once a day and sometimes he said he does not take it because it think he doesn't need it especially when his blood pressure is normal at home. His lipid profile is at goal, his blood pressure is controlled today, blood sugar is high above 200. Patient does not smoke cigarette, he does not drink alcohol. He claims he does regular exercise at least 3 times a week. Patient has No headache, No chest pain, No abdominal pain - No Nausea, No new weakness tingling or numbness, No Cough - SOB.  ALLERGIES: No Known Allergies  PAST MEDICAL HISTORY: Past Medical History  Diagnosis Date  . Hypertension   . Diabetes mellitus without complication   . High cholesterol     MEDICATIONS AT HOME: Prior to Admission medications   Medication Sig Start Date End Date Taking? Authorizing Provider  aspirin 81 MG tablet Take 81 mg by mouth daily.   Yes Historical Provider, MD  atorvastatin (LIPITOR) 20 MG tablet Take 1 tablet (20 mg total) by mouth daily. 04/09/13  Yes Shanker Levora Dredge, MD  carvedilol (COREG) 25 MG tablet Take 1 tablet (25 mg total) by mouth 2 (two) times daily with a meal. 04/09/13  Yes Shanker Levora Dredge, MD  gabapentin (NEURONTIN) 300 MG capsule Take 1 capsule (300 mg total) by mouth at bedtime. 04/09/13  Yes Shanker Levora Dredge, MD  insulin aspart (NOVOLOG) 100 UNIT/ML injection Inject 15 Units into the skin 2 (two) times daily with a  meal. 04/09/13  Yes Shanker Levora Dredge, MD  insulin glargine (LANTUS) 100 UNIT/ML injection Inject 0.25 mLs (25 Units total) into the skin at bedtime. 04/09/13  Yes Shanker Levora Dredge, MD  metFORMIN (GLUCOPHAGE) 1000 MG tablet Take 1 tablet (1,000 mg total) by mouth 2 (two) times daily with a meal. 04/18/13  Yes Gaylord Shih, MD  olmesartan (BENICAR) 40 MG tablet Take 40 mg by mouth daily.   Yes Historical Provider, MD  pregabalin (LYRICA) 50 MG capsule Take 50 mg by mouth 3 (three) times daily.   Yes Historical Provider, MD  Albuterol (PROVENTIL IN) Inhale into the lungs.    Historical Provider, MD  esomeprazole (NEXIUM) 40 MG capsule Take 1 capsule (40 mg total) by mouth daily. 08/07/13   Jeanann Lewandowsky, MD  polyethylene glycol powder (GLYCOLAX/MIRALAX) powder Take 17 g by mouth 2 (two) times daily as needed. 04/09/13   Shanker Levora Dredge, MD     Objective:   Filed Vitals:   08/07/13 1042  BP: 124/82  Pulse: 74  Temp: 98.4 F (36.9 C)  TempSrc: Oral  Resp: 16  Height: 5\' 8"  (1.727 m)  Weight: 241 lb (109.317 kg)  SpO2: 97%    Exam General appearance : Awake, alert, not in any distress. Speech Clear. Not toxic looking HEENT: Atraumatic and Normocephalic, pupils equally reactive to light and accomodation  Neck: supple, no JVD. No cervical lymphadenopathy.  Chest:Good air entry bilaterally, no added sounds  CVS: S1 S2 regular, no murmurs.  Abdomen: Bowel sounds present, Non tender and not distended with no gaurding, rigidity or rebound. Extremities: B/L Lower Ext shows no edema, both legs are warm to touch Neurology: Awake alert, and oriented X 3, CN II-XII intact, Non focal Skin:No Rash Wounds:N/A   Data Review   CBC No results found for this basename: WBC, HGB, HCT, PLT, MCV, MCH, MCHC, RDW, NEUTRABS, LYMPHSABS, MONOABS, EOSABS, BASOSABS, BANDABS, BANDSABD,  in the last 168 hours  Chemistries   No results found for this basename: NA, K, CL, CO2, GLUCOSE, BUN, CREATININE, GFRCGP,  CALCIUM, MG, AST, ALT, ALKPHOS, BILITOT,  in the last 168 hours ------------------------------------------------------------------------------------------------------------------ No results found for this basename: HGBA1C,  in the last 72 hours ------------------------------------------------------------------------------------------------------------------ No results found for this basename: CHOL, HDL, LDLCALC, TRIG, CHOLHDL, LDLDIRECT,  in the last 72 hours ------------------------------------------------------------------------------------------------------------------ No results found for this basename: TSH, T4TOTAL, FREET3, T3FREE, THYROIDAB,  in the last 72 hours ------------------------------------------------------------------------------------------------------------------ No results found for this basename: VITAMINB12, FOLATE, FERRITIN, TIBC, IRON, RETICCTPCT,  in the last 72 hours  Coagulation profile  No results found for this basename: INR, PROTIME,  in the last 168 hours    Assessment & Plan   1. Diabetes Hemoglobin A1c at next visit - Glucose (CBG)  2. DM (diabetes mellitus) type 2, uncontrolled, with ketoacidosis Continue metformin 1000 mg tablet by mouth twice a day Continue insulin regimen  3. Essential hypertension Continue carvedilol 25 mg tablet by mouth twice a day Continue Olmesartan 40 mg tablet by mouth daily  4. Dyslipidemia Patient was counseled extensively about nutrition and exercise Continue atorvastatin 20 mg tablet by mouth daily  5. GERD (gastroesophageal reflux disease) Refill - esomeprazole (NEXIUM) 40 MG capsule; Take 1 capsule (40 mg total) by mouth daily.  Dispense: 90 capsule; Refill: 3   Follow up in 3 months or when necessary   The patient was given clear instructions to go to ER or return to medical center if symptoms don't improve, worsen or new problems develop. The patient verbalized understanding. The patient was told to call to  get lab results if they haven't heard anything in the next week.    Jeanann LewandowskyJEGEDE, Allanna Bresee, MD, MHA, FACP, FAAP [facility] [place], [place] [phone]   08/07/2013, 5:27 PM

## 2013-08-07 NOTE — Progress Notes (Signed)
Pt is here following up on his HTN and diabetes. 

## 2013-09-05 ENCOUNTER — Ambulatory Visit: Payer: Self-pay | Attending: Internal Medicine

## 2013-09-05 ENCOUNTER — Telehealth: Payer: Self-pay | Admitting: Internal Medicine

## 2013-09-05 DIAGNOSIS — I10 Essential (primary) hypertension: Secondary | ICD-10-CM

## 2013-09-05 MED ORDER — OLMESARTAN MEDOXOMIL 40 MG PO TABS: 40.0000 mg | ORAL_TABLET | Freq: Every day | ORAL | Status: DC

## 2013-09-07 ENCOUNTER — Ambulatory Visit: Payer: No Typology Code available for payment source | Attending: Internal Medicine

## 2013-09-07 NOTE — Telephone Encounter (Signed)
Patient presented to clinic requesting a refill on his Benicar.  Also stated that he was feeling a little dizzy.  BP checked and was WNL (see vital signs).    I contacted Dr. Hyman Hopes who gave me a verbal order to refill it for 30 days.  Patient also stated that he was started on Coreg at his last OV and when he took the first dose, he experienced SOB on exertion.  Discussed this with Dr. Daleen Squibb who advised that he stop taking this medication.  Instructed patient to discontinue medication.

## 2013-10-02 ENCOUNTER — Other Ambulatory Visit: Payer: Self-pay | Admitting: Emergency Medicine

## 2013-10-02 DIAGNOSIS — I10 Essential (primary) hypertension: Secondary | ICD-10-CM

## 2013-10-02 MED ORDER — OLMESARTAN MEDOXOMIL 40 MG PO TABS
40.0000 mg | ORAL_TABLET | Freq: Every day | ORAL | Status: DC
Start: 2013-10-02 — End: 2013-10-03

## 2013-10-03 ENCOUNTER — Telehealth: Payer: Self-pay | Admitting: Emergency Medicine

## 2013-10-03 ENCOUNTER — Telehealth: Payer: Self-pay | Admitting: Internal Medicine

## 2013-10-03 DIAGNOSIS — I10 Essential (primary) hypertension: Secondary | ICD-10-CM

## 2013-10-03 MED ORDER — OLMESARTAN MEDOXOMIL 40 MG PO TABS: 40.0000 mg | ORAL_TABLET | Freq: Every day | ORAL | Status: DC

## 2013-10-03 NOTE — Telephone Encounter (Signed)
Pt aware medication faxed to [facility] for pick up

## 2013-10-03 NOTE — Telephone Encounter (Signed)
Pt called regarding a refill of his medication olmesartan (BENICAR) 40 MG, Please contact pt

## 2013-10-09 ENCOUNTER — Other Ambulatory Visit: Payer: Self-pay

## 2013-10-09 MED ORDER — INSULIN ASPART 100 UNIT/ML ~~LOC~~ SOLN: 15.0000 [IU] | Freq: Two times a day (BID) | SUBCUTANEOUS | Status: DC

## 2013-10-09 MED ORDER — INSULIN GLARGINE 100 UNIT/ML ~~LOC~~ SOLN: 25.0000 [IU] | Freq: Every day | SUBCUTANEOUS | Status: DC

## 2013-10-29 ENCOUNTER — Ambulatory Visit: Payer: No Typology Code available for payment source | Attending: Internal Medicine

## 2013-10-29 DIAGNOSIS — E119 Type 2 diabetes mellitus without complications: Secondary | ICD-10-CM

## 2013-10-29 LAB — CBC WITH DIFFERENTIAL/PLATELET
Basophils Absolute: 0.1 10*3/uL (ref 0.0–0.1)
Basophils Relative: 1 % (ref 0–1)
EOS ABS: 0.3 10*3/uL (ref 0.0–0.7)
Eosinophils Relative: 4 % (ref 0–5)
HCT: 43.7 % (ref 39.0–52.0)
HEMOGLOBIN: 14.6 g/dL (ref 13.0–17.0)
LYMPHS ABS: 2.2 10*3/uL (ref 0.7–4.0)
Lymphocytes Relative: 28 % (ref 12–46)
MCH: 26.1 pg (ref 26.0–34.0)
MCHC: 33.4 g/dL (ref 30.0–36.0)
MCV: 78 fL (ref 78.0–100.0)
MONOS PCT: 8 % (ref 3–12)
Monocytes Absolute: 0.6 10*3/uL (ref 0.1–1.0)
Neutro Abs: 4.7 10*3/uL (ref 1.7–7.7)
Neutrophils Relative %: 59 % (ref 43–77)
Platelets: 180 10*3/uL (ref 150–400)
RBC: 5.6 MIL/uL (ref 4.22–5.81)
RDW: 15.2 % (ref 11.5–15.5)
WBC: 8 10*3/uL (ref 4.0–10.5)

## 2013-10-29 LAB — COMPLETE METABOLIC PANEL WITH GFR
ALT: 27 U/L (ref 0–53)
AST: 22 U/L (ref 0–37)
Albumin: 4.2 g/dL (ref 3.5–5.2)
Alkaline Phosphatase: 49 U/L (ref 39–117)
BILIRUBIN TOTAL: 0.6 mg/dL (ref 0.2–1.2)
BUN: 21 mg/dL (ref 6–23)
CO2: 30 meq/L (ref 19–32)
CREATININE: 1.04 mg/dL (ref 0.50–1.35)
Calcium: 9.5 mg/dL (ref 8.4–10.5)
Chloride: 100 mEq/L (ref 96–112)
GFR, EST NON AFRICAN AMERICAN: 83 mL/min
GLUCOSE: 100 mg/dL — AB (ref 70–99)
Potassium: 4.8 mEq/L (ref 3.5–5.3)
Sodium: 138 mEq/L (ref 135–145)
Total Protein: 6.7 g/dL (ref 6.0–8.3)

## 2013-10-29 LAB — LIPID PANEL
Cholesterol: 177 mg/dL (ref 0–200)
HDL: 42 mg/dL (ref >39)
LDL Cholesterol: 118 mg/dL — ABNORMAL HIGH (ref 0–99)
TRIGLYCERIDES: 83 mg/dL (ref <150)
Total CHOL/HDL Ratio: 4.2 Ratio
VLDL: 17 mg/dL (ref 0–40)

## 2013-10-30 ENCOUNTER — Telehealth: Payer: Self-pay | Admitting: Emergency Medicine

## 2013-10-30 NOTE — Telephone Encounter (Signed)
Message copied by Darlis Loan on Tue Oct 30, 2013  6:30 PM ------      Message from: Quentin Angst      Created: Tue Oct 30, 2013  6:17 PM       Please inform patient that his laboratory tests results are within normal limit except for slightly high cholesterol level. Will encourage her to continue her atorvastatin and to engage in regular physical exercise as well as nutritional control. We recommend low cholesterol low fat diet ------

## 2013-10-30 NOTE — Telephone Encounter (Signed)
Pt given lab results states he stopped taking Atorvastatin due to diet/exercise control several months ago. LDH 118 I instructed pt to hold off from taking medication for now until discussed with Dr. Hyman Hopes

## 2013-11-05 ENCOUNTER — Ambulatory Visit: Payer: No Typology Code available for payment source | Attending: Internal Medicine | Admitting: Internal Medicine

## 2013-11-05 ENCOUNTER — Encounter: Payer: Self-pay | Admitting: Internal Medicine

## 2013-11-05 VITALS — BP 135/88 | HR 71 | Temp 98.7°F | Resp 16 | Ht 68.0 in | Wt 245.0 lb

## 2013-11-05 DIAGNOSIS — Z760 Encounter for issue of repeat prescription: Secondary | ICD-10-CM | POA: Insufficient documentation

## 2013-11-05 DIAGNOSIS — N521 Erectile dysfunction due to diseases classified elsewhere: Secondary | ICD-10-CM

## 2013-11-05 DIAGNOSIS — N529 Male erectile dysfunction, unspecified: Secondary | ICD-10-CM | POA: Insufficient documentation

## 2013-11-05 DIAGNOSIS — IMO0002 Reserved for concepts with insufficient information to code with codable children: Secondary | ICD-10-CM | POA: Insufficient documentation

## 2013-11-05 DIAGNOSIS — Z09 Encounter for follow-up examination after completed treatment for conditions other than malignant neoplasm: Secondary | ICD-10-CM | POA: Insufficient documentation

## 2013-11-05 DIAGNOSIS — Z794 Long term (current) use of insulin: Secondary | ICD-10-CM | POA: Insufficient documentation

## 2013-11-05 DIAGNOSIS — I10 Essential (primary) hypertension: Secondary | ICD-10-CM | POA: Insufficient documentation

## 2013-11-05 DIAGNOSIS — E1110 Type 2 diabetes mellitus with ketoacidosis without coma: Secondary | ICD-10-CM

## 2013-11-05 DIAGNOSIS — E1310 Other specified diabetes mellitus with ketoacidosis without coma: Secondary | ICD-10-CM | POA: Insufficient documentation

## 2013-11-05 DIAGNOSIS — J309 Allergic rhinitis, unspecified: Secondary | ICD-10-CM

## 2013-11-05 DIAGNOSIS — J301 Allergic rhinitis due to pollen: Secondary | ICD-10-CM | POA: Insufficient documentation

## 2013-11-05 DIAGNOSIS — E1169 Type 2 diabetes mellitus with other specified complication: Secondary | ICD-10-CM | POA: Insufficient documentation

## 2013-11-05 DIAGNOSIS — J302 Other seasonal allergic rhinitis: Secondary | ICD-10-CM | POA: Insufficient documentation

## 2013-11-05 DIAGNOSIS — E1165 Type 2 diabetes mellitus with hyperglycemia: Secondary | ICD-10-CM | POA: Insufficient documentation

## 2013-11-05 LAB — GLUCOSE, POCT (MANUAL RESULT ENTRY): POC Glucose: 169 mg/dl — AB (ref 70–99)

## 2013-11-05 LAB — POCT GLYCOSYLATED HEMOGLOBIN (HGB A1C): Hemoglobin A1C: 7.9

## 2013-11-05 MED ORDER — INSULIN GLARGINE 100 UNIT/ML ~~LOC~~ SOLN: 30.0000 [IU] | Freq: Every day | SUBCUTANEOUS | Status: DC

## 2013-11-05 MED ORDER — LORATADINE 10 MG PO TABS: 10.0000 mg | ORAL_TABLET | Freq: Every day | ORAL | Status: DC

## 2013-11-05 MED ORDER — TADALAFIL 20 MG PO TABS: 10.0000 mg | ORAL_TABLET | ORAL | Status: DC | PRN

## 2013-11-05 MED ORDER — OLMESARTAN MEDOXOMIL 40 MG PO TABS: 40.0000 mg | ORAL_TABLET | Freq: Every day | ORAL | Status: DC

## 2013-11-05 MED ORDER — HYDROCHLOROTHIAZIDE 25 MG PO TABS: 25.0000 mg | ORAL_TABLET | Freq: Every day | ORAL | Status: DC

## 2013-11-05 MED ORDER — INSULIN ASPART 100 UNIT/ML ~~LOC~~ SOLN: 10.0000 [IU] | Freq: Two times a day (BID) | SUBCUTANEOUS | Status: DC

## 2013-11-05 NOTE — Progress Notes (Signed)
Pt is here today following up on his diabetes.

## 2013-11-05 NOTE — Progress Notes (Signed)
Patient ID: Benjamin [name], male   DOB: 25-May-1961, 53 y.o.   MRN: [id number]   Ival Phegley, is a 53 y.o. male  [id number]  [id number]  DOB - 1961/06/01  Chief Complaint  Patient presents with  . Follow-up        Subjective:   Benjamin [name] is a 53 y.o. male here today for a follow up visit. Patient has history of hypertension, diabetes mellitus, dyslipidemia, and erectile dysfunction. He is here today for medication refill. She still taking his medications the way he likes to take them not following prescription. Blood pressure is controlled. No complaint today. He does not smoke cigarette. Patient has No headache, No chest pain, No abdominal pain - No Nausea, No new weakness tingling or numbness, No Cough - SOB.  Problem  Htn (Hypertension)  Erectile Dysfunction Associated With Type 2 Diabetes Mellitus  Seasonal Allergies    ALLERGIES: No Known Allergies  PAST MEDICAL HISTORY: Past Medical History  Diagnosis Date  . Hypertension   . Diabetes mellitus without complication   . High cholesterol     MEDICATIONS AT HOME: Prior to Admission medications   Medication Sig Start Date End Date Taking? Authorizing Provider  aspirin 81 MG tablet Take 81 mg by mouth daily.   Yes Historical Provider, MD  carvedilol (COREG) 25 MG tablet Take 1 tablet (25 mg total) by mouth 2 (two) times daily with a meal. 04/09/13  Yes Shanker Levora Dredge, MD  esomeprazole (NEXIUM) 40 MG capsule Take 1 capsule (40 mg total) by mouth daily. 08/07/13  Yes Jeanann Lewandowsky, MD  hydrochlorothiazide (HYDRODIURIL) 25 MG tablet Take 1 tablet (25 mg total) by mouth daily. 11/05/13  Yes Jeanann Lewandowsky, MD  insulin aspart (NOVOLOG) 100 UNIT/ML injection Inject 10 Units into the skin 2 (two) times daily with a meal. 11/05/13  Yes Jeanann Lewandowsky, MD  insulin glargine (LANTUS) 100 UNIT/ML injection Inject 0.3 mLs (30 Units total) into the skin at bedtime. 11/05/13  Yes Jeanann Lewandowsky, MD  metFORMIN (GLUCOPHAGE) 1000 MG  tablet Take 1 tablet (1,000 mg total) by mouth 2 (two) times daily with a meal. 04/18/13  Yes Gaylord Shih, MD  olmesartan (BENICAR) 40 MG tablet Take 1 tablet (40 mg total) by mouth daily. 11/05/13  Yes Jeanann Lewandowsky, MD  pregabalin (LYRICA) 50 MG capsule Take 50 mg by mouth 3 (three) times daily.   Yes Historical Provider, MD  Albuterol (PROVENTIL IN) Inhale into the lungs.    Historical Provider, MD  atorvastatin (LIPITOR) 20 MG tablet Take 1 tablet (20 mg total) by mouth daily. 04/09/13   Shanker Levora Dredge, MD  gabapentin (NEURONTIN) 300 MG capsule Take 1 capsule (300 mg total) by mouth at bedtime. 04/09/13   Shanker Levora Dredge, MD  loratadine (CLARITIN) 10 MG tablet Take 1 tablet (10 mg total) by mouth daily. 11/05/13   Jeanann Lewandowsky, MD  polyethylene glycol powder (GLYCOLAX/MIRALAX) powder Take 17 g by mouth 2 (two) times daily as needed. 04/09/13   Shanker Levora Dredge, MD  tadalafil (CIALIS) 20 MG tablet Take 0.5-1 tablets (10-20 mg total) by mouth every other day as needed for erectile dysfunction. 11/05/13   Jeanann Lewandowsky, MD     Objective:   Filed Vitals:   11/05/13 1205  BP: 135/88  Pulse: 71  Temp: 98.7 F (37.1 C)  TempSrc: Oral  Resp: 16  Height: 5\' 8"  (1.727 m)  Weight: 245 lb (111.131 kg)  SpO2: 96%    Exam General appearance : Awake,  alert, not in any distress. Speech Clear. Not toxic looking HEENT: Atraumatic and Normocephalic, pupils equally reactive to light and accomodation Neck: supple, no JVD. No cervical lymphadenopathy.  Chest:Good air entry bilaterally, no added sounds  CVS: S1 S2 regular, no murmurs.  Abdomen: Bowel sounds present, Non tender and not distended with no gaurding, rigidity or rebound. Extremities: B/L Lower Ext shows no edema, both legs are warm to touch Neurology: Awake alert, and oriented X 3, CN II-XII intact, Non focal Skin:No Rash Wounds:N/A  Data Review Lab Results  Component Value Date   HGBA1C 7.9 11/05/2013   HGBA1C 7.2  07/31/2013   HGBA1C 8.7* 04/10/2013     Assessment & Plan   1. DM (diabetes mellitus) type 2, uncontrolled, with ketoacidosis  - Glucose (CBG) - HgB A1c is 7.9% today - insulin glargine (LANTUS) 100 UNIT/ML injection; Inject 0.3 mLs (30 Units total) into the skin at bedtime.  Dispense: 3 vial; Refill: 3 - insulin aspart (NOVOLOG) 100 UNIT/ML injection; Inject 10 Units into the skin 2 (two) times daily with a meal.  Dispense: 3 vial; Refill: 3  2. HTN (hypertension)  - hydrochlorothiazide (HYDRODIURIL) 25 MG tablet; Take 1 tablet (25 mg total) by mouth daily.  Dispense: 90 tablet; Refill: 3 - olmesartan (BENICAR) 40 MG tablet; Take 1 tablet (40 mg total) by mouth daily.  Dispense: 90 tablet; Refill: 3  3. Erectile dysfunction associated with type 2 diabetes mellitus  - tadalafil (CIALIS) 20 MG tablet; Take 0.5-1 tablets (10-20 mg total) by mouth every other day as needed for erectile dysfunction.  Dispense: 90 tablet; Refill: 3  - Testosterone  4. Seasonal allergies - loratadine (CLARITIN) 10 MG tablet; Take 1 tablet (10 mg total) by mouth daily.  Dispense: 30 tablet; Refill: 3   Return in about 3 months (around 02/04/2014) for Hemoglobin A1C and Follow up, DM, Follow up HTN.  The patient was given clear instructions to go to ER or return to medical center if symptoms don't improve, worsen or new problems develop. The patient verbalized understanding. The patient was told to call to get lab results if they haven't heard anything in the next week.   This note has been created with Education officer, environmentalDragon speech recognition software and smart phrase technology. Any transcriptional errors are unintentional.    Jeanann LewandowskyJEGEDE, Deborahann Poteat, MD, MHA, FACP, FAAP [facility] [place], [place] [phone]   11/05/2013, 12:54 PM

## 2013-11-06 LAB — TESTOSTERONE: Testosterone: 362 ng/dL (ref 300–890)

## 2013-11-07 ENCOUNTER — Telehealth: Payer: Self-pay | Admitting: *Deleted

## 2013-11-07 NOTE — Telephone Encounter (Signed)
Pt is aware of his lab results. 

## 2014-01-07 ENCOUNTER — Telehealth: Payer: Self-pay | Admitting: Internal Medicine

## 2014-01-07 NOTE — Telephone Encounter (Signed)
Pt calling regarding refill for carvedilol (COREG) 25 MG tablet.  Pt says [facility] has sent over a refill request but have yet to receive response. Please f/u with pt when refill has been sent.

## 2014-01-08 ENCOUNTER — Other Ambulatory Visit: Payer: Self-pay | Admitting: Emergency Medicine

## 2014-01-08 ENCOUNTER — Telehealth: Payer: Self-pay | Admitting: Emergency Medicine

## 2014-01-08 MED ORDER — CARVEDILOL 25 MG PO TABS: 25.0000 mg | ORAL_TABLET | Freq: Two times a day (BID) | ORAL | Status: DC

## 2014-01-08 NOTE — Telephone Encounter (Signed)
Left message for pt to pick medication Coreg at [facility]

## 2014-02-11 ENCOUNTER — Ambulatory Visit: Payer: No Typology Code available for payment source | Admitting: Internal Medicine

## 2014-02-14 ENCOUNTER — Other Ambulatory Visit: Payer: Self-pay | Admitting: *Deleted

## 2014-04-16 ENCOUNTER — Telehealth: Payer: Self-pay | Admitting: Internal Medicine

## 2014-04-16 NOTE — Telephone Encounter (Signed)
Pt. Came in due to pain on foot, pt. Is a diabetic. We do not have appt's available in the next five days,pt. Was advised to go to urgent care and then f/u with our clinic.

## 2014-04-18 ENCOUNTER — Encounter: Payer: Self-pay | Admitting: Internal Medicine

## 2014-04-18 ENCOUNTER — Ambulatory Visit: Payer: Self-pay | Attending: Internal Medicine | Admitting: Internal Medicine

## 2014-04-18 VITALS — BP 122/86 | HR 85 | Temp 98.2°F | Resp 16 | Ht 67.0 in | Wt 250.0 lb

## 2014-04-18 DIAGNOSIS — L97409 Non-pressure chronic ulcer of unspecified heel and midfoot with unspecified severity: Secondary | ICD-10-CM | POA: Insufficient documentation

## 2014-04-18 DIAGNOSIS — E119 Type 2 diabetes mellitus without complications: Secondary | ICD-10-CM

## 2014-04-18 DIAGNOSIS — E1169 Type 2 diabetes mellitus with other specified complication: Secondary | ICD-10-CM

## 2014-04-18 DIAGNOSIS — E7800 Pure hypercholesterolemia, unspecified: Secondary | ICD-10-CM | POA: Insufficient documentation

## 2014-04-18 DIAGNOSIS — Z7982 Long term (current) use of aspirin: Secondary | ICD-10-CM | POA: Insufficient documentation

## 2014-04-18 DIAGNOSIS — E118 Type 2 diabetes mellitus with unspecified complications: Secondary | ICD-10-CM

## 2014-04-18 DIAGNOSIS — I10 Essential (primary) hypertension: Secondary | ICD-10-CM | POA: Insufficient documentation

## 2014-04-18 DIAGNOSIS — Z79899 Other long term (current) drug therapy: Secondary | ICD-10-CM | POA: Insufficient documentation

## 2014-04-18 DIAGNOSIS — Z794 Long term (current) use of insulin: Secondary | ICD-10-CM | POA: Insufficient documentation

## 2014-04-18 LAB — POCT GLYCOSYLATED HEMOGLOBIN (HGB A1C): HEMOGLOBIN A1C: 7.5

## 2014-04-18 MED ORDER — ACETAMINOPHEN-CODEINE #3 300-30 MG PO TABS: 1.0000 | ORAL_TABLET | ORAL | Status: DC | PRN

## 2014-04-18 MED ORDER — CIPROFLOXACIN HCL 500 MG PO TABS: 500.0000 mg | ORAL_TABLET | Freq: Two times a day (BID) | ORAL | Status: DC

## 2014-04-18 MED ORDER — CLINDAMYCIN HCL 300 MG PO CAPS: 300.0000 mg | ORAL_CAPSULE | Freq: Three times a day (TID) | ORAL | Status: DC

## 2014-04-18 NOTE — Progress Notes (Signed)
Pt is here following up on his HTN and diabetes. Pt injured his feet workout on a treadmill and he is now in pain when he walks.

## 2014-04-18 NOTE — Patient Instructions (Signed)
Diabetes and Foot Care Diabetes may cause you to have problems because of poor blood supply (circulation) to your feet and legs. This may cause the skin on your feet to become thinner, break easier, and heal more slowly. Your skin may become dry, and the skin may peel and crack. You may also have nerve damage in your legs and feet causing decreased feeling in them. You may not notice minor injuries to your feet that could lead to infections or more serious problems. Taking care of your feet is one of the most important things you can do for yourself.  HOME CARE INSTRUCTIONS  Wear shoes at all times, even in the house. Do not go barefoot. Bare feet are easily injured.  Check your feet daily for blisters, cuts, and redness. If you cannot see the bottom of your feet, use a mirror or ask someone for help.  Wash your feet with warm water (do not use hot water) and mild soap. Then pat your feet and the areas between your toes until they are completely dry. Do not soak your feet as this can dry your skin.  Apply a moisturizing lotion or petroleum jelly (that does not contain alcohol and is unscented) to the skin on your feet and to dry, brittle toenails. Do not apply lotion between your toes.  Trim your toenails straight across. Do not dig under them or around the cuticle. File the edges of your nails with an emery board or nail file.  Do not cut corns or calluses or try to remove them with medicine.  Wear clean socks or stockings every day. Make sure they are not too tight. Do not wear knee-high stockings since they may decrease blood flow to your legs.  Wear shoes that fit properly and have enough cushioning. To break in new shoes, wear them for just a few hours a day. This prevents you from injuring your feet. Always look in your shoes before you put them on to be sure there are no objects inside.  Do not cross your legs. This may decrease the blood flow to your feet.  If you find a minor scrape,  cut, or break in the skin on your feet, keep it and the skin around it clean and dry. These areas may be cleansed with mild soap and water. Do not cleanse the area with peroxide, alcohol, or iodine.  When you remove an adhesive bandage, be sure not to damage the skin around it.  If you have a wound, look at it several times a day to make sure it is healing.  Do not use heating pads or hot water bottles. They may burn your skin. If you have lost feeling in your feet or legs, you may not know it is happening until it is too late.  Make sure your health care provider performs a complete foot exam at least annually or more often if you have foot problems. Report any cuts, sores, or bruises to your health care provider immediately. SEEK MEDICAL CARE IF:   You have an injury that is not healing.  You have cuts or breaks in the skin.  You have an ingrown nail.  You notice redness on your legs or feet.  You feel burning or tingling in your legs or feet.  You have pain or cramps in your legs and feet.  Your legs or feet are numb.  Your feet always feel cold. SEEK IMMEDIATE MEDICAL CARE IF:   There is increasing redness,   swelling, or pain in or around a wound.  There is a red line that goes up your leg.  Pus is coming from a wound.  You develop a fever or as directed by your health care provider.  You notice a bad smell coming from an ulcer or wound. Document Released: 07/16/2000 Document Revised: 03/21/2013 Document Reviewed: 12/26/2012 ExitCare Patient Information 2015 [employer]. This information is not intended to replace advice given to you by your health care provider. Make sure you discuss any questions you have with your health care provider.  

## 2014-04-18 NOTE — Progress Notes (Signed)
Patient ID: Benjamin [name], male   DOB: [birth year], 53 y.o.   MRN: [id number]   Benjamin [name], is a 53 y.o. male  [id number]  [id number]  DOB - 11-Jan-1961  Chief Complaint  Patient presents with  . Follow-up        Subjective:   Benjamin [name] is a 53 y.o. male here today for a follow up visit. Patient has history of diabetes mellitus, hypertension, and dyslipidemia. He was riding a treadmill using a very tight tennis shoes about a week ago, 2 days later he discovered that both his plantar surface are sloughing, slightly swollen, and painful. He came in today to make sure he has no infection, and to know what to do next step. When asked why he stayed this long before coming to the clinic, he said he was trying to see if it will get better, he has been taking amoxicillin [place]. He has no fever. He still wears socks and shoes. No pus discharge. He claims blood sugar has been within acceptable range at home, blood pressure is controlled. He claims compliant with medications. Patient has No headache, No chest pain, No abdominal pain - No Nausea, No new weakness tingling or numbness, No Cough - SOB.  Problem  Diabetic Foot    ALLERGIES: No Known Allergies  PAST MEDICAL HISTORY: Past Medical History  Diagnosis Date  . Hypertension   . Diabetes mellitus without complication   . High cholesterol     MEDICATIONS AT HOME: Prior to Admission medications   Medication Sig Start Date End Date Taking? Authorizing Provider  Albuterol (PROVENTIL IN) Inhale into the lungs.   Yes Historical Provider, MD  aspirin 81 MG tablet Take 81 mg by mouth daily.   Yes Historical Provider, MD  atorvastatin (LIPITOR) 20 MG tablet Take 1 tablet (20 mg total) by mouth daily. 04/09/13  Yes Shanker Levora Dredge, MD  carvedilol (COREG) 25 MG tablet Take 1 tablet (25 mg total) by mouth 2 (two) times daily with a meal. 01/08/14  Yes [place] Folden E Hyman Hopes, MD  esomeprazole (NEXIUM) 40 MG capsule Take  1 capsule (40 mg total) by mouth daily. 08/07/13  Yes Quentin Angst, MD  gabapentin (NEURONTIN) 300 MG capsule Take 1 capsule (300 mg total) by mouth at bedtime. 04/09/13  Yes Shanker Levora Dredge, MD  hydrochlorothiazide (HYDRODIURIL) 25 MG tablet Take 1 tablet (25 mg total) by mouth daily. 11/05/13  Yes Quentin Angst, MD  insulin aspart (NOVOLOG) 100 UNIT/ML injection Inject 10 Units into the skin 2 (two) times daily with a meal. 11/05/13  Yes [name] E Hyman Hopes, MD  insulin glargine (LANTUS) 100 UNIT/ML injection Inject 0.3 mLs (30 Units total) into the skin at bedtime. 11/05/13  Yes Quentin Angst, MD  loratadine (CLARITIN) 10 MG tablet Take 1 tablet (10 mg total) by mouth daily. 11/05/13  Yes Quentin Angst, MD  metFORMIN (GLUCOPHAGE) 1000 MG tablet Take 1 tablet (1,000 mg total) by mouth 2 (two) times daily with a meal. 04/18/13  Yes Gaylord Shih, MD  olmesartan (BENICAR) 40 MG tablet Take 1 tablet (40 mg total) by mouth daily. 11/05/13  Yes Quentin Angst, MD  pregabalin (LYRICA) 50 MG capsule Take 50 mg by mouth 3 (three) times daily.   Yes Historical Provider, MD  acetaminophen-codeine (TYLENOL #3) 300-30 MG per tablet Take 1 tablet by mouth every 4 (four) hours as needed. 04/18/14   Quentin Angst, MD  ciprofloxacin (CIPRO) 500  MG tablet Take 1 tablet (500 mg total) by mouth 2 (two) times daily. 04/18/14   Quentin Angst, MD  clindamycin (CLEOCIN) 300 MG capsule Take 1 capsule (300 mg total) by mouth 3 (three) times daily. 04/18/14   Quentin Angst, MD  polyethylene glycol powder (GLYCOLAX/MIRALAX) powder Take 17 g by mouth 2 (two) times daily as needed. 04/09/13   Shanker Levora Dredge, MD  tadalafil (CIALIS) 20 MG tablet Take 0.5-1 tablets (10-20 mg total) by mouth every other day as needed for erectile dysfunction. 11/05/13   Quentin Angst, MD     Objective:   Filed Vitals:   04/18/14 0917  BP: 122/86  Pulse: 85  Temp: 98.2 F (36.8 C)  TempSrc: Oral  Resp:  16  Height: 5\' 7"  (1.702 m)  Weight: 250 lb (113.399 kg)  SpO2: 99%    Exam General appearance : Awake, alert, not in any distress. Speech Clear. Not toxic looking HEENT: Atraumatic and Normocephalic, pupils equally reactive to light and accomodation Neck: supple, no JVD. No cervical lymphadenopathy.  Chest:Good air entry bilaterally, no added sounds  CVS: S1 S2 regular, no murmurs.  Abdomen: Bowel sounds present, Non tender and not distended with no gaurding, rigidity or rebound. Extremities: B/L Lower Ext shows no edema, both legs are warm to touch Neurology: Awake alert, and oriented X 3, CN II-XII intact, Non focal Skin:No Rash Wounds: Bilateral plantar surface blister, no pus discharge, looks mostly like sloughing from overuse. No swelling, no redness, mild to moderate tenderness. No foul-smelling.  Data Review Lab Results  Component Value Date   HGBA1C 7.5 04/18/2014   HGBA1C 7.9 11/05/2013   HGBA1C 7.2 07/31/2013     Assessment & Plan   1. Type 2 diabetes mellitus without complication  - Glucose (CBG) - HgB A1c is 7.5% today, slightly better than some 0.9% in April  2. Diabetic foot  - ciprofloxacin (CIPRO) 500 MG tablet; Take 1 tablet (500 mg total) by mouth 2 (two) times daily.  Dispense: 30 tablet; Refill: 0 - clindamycin (CLEOCIN) 300 MG capsule; Take 1 capsule (300 mg total) by mouth 3 (three) times daily.  Dispense: 45 capsule; Refill: 0 - acetaminophen-codeine (TYLENOL #3) 300-30 MG per tablet; Take 1 tablet by mouth every 4 (four) hours as needed.  Dispense: 60 tablet; Refill: 0 - CBC with Differential - Basic Metabolic Panel - C-reactive protein - Sedimentation rate  Patient has been given extensive education about foot care, he was specifically instructed to go to the ER immediately if there is redness or swelling around the foot, wound discharge, excessive pain, or fever. Patient is also given instruction to start antibiotic immediately and to report back to  clinic in 4 days. He has been educated on how to dress the wound.  Return in about 4 days (around 04/22/2014), or if symptoms worsen or fail to improve, for Wound Care.  The patient was given clear instructions to go to ER or return to medical center if symptoms don't improve, worsen or new problems develop. The patient verbalized understanding. The patient was told to call to get lab results if they haven't heard anything in the next week.   This note has been created with Education officer, environmental. Any transcriptional errors are unintentional.    Jeanann Lewandowsky, MD, MHA, FACP, FAAP [facility] [place], [place] [phone]   04/18/2014, 10:12 AM

## 2014-04-19 ENCOUNTER — Ambulatory Visit: Payer: Self-pay | Attending: Internal Medicine

## 2014-04-19 LAB — CBC WITH DIFFERENTIAL/PLATELET
Basophils Absolute: 0.1 10*3/uL (ref 0.0–0.1)
Basophils Relative: 1 % (ref 0–1)
Eosinophils Absolute: 0.4 10*3/uL (ref 0.0–0.7)
Eosinophils Relative: 4 % (ref 0–5)
HEMATOCRIT: 47.4 % (ref 39.0–52.0)
HEMOGLOBIN: 16 g/dL (ref 13.0–17.0)
Lymphocytes Relative: 25 % (ref 12–46)
Lymphs Abs: 2.4 10*3/uL (ref 0.7–4.0)
MCH: 26 pg (ref 26.0–34.0)
MCHC: 33.8 g/dL (ref 30.0–36.0)
MCV: 76.9 fL — ABNORMAL LOW (ref 78.0–100.0)
MONO ABS: 1 10*3/uL (ref 0.1–1.0)
MONOS PCT: 11 % (ref 3–12)
NEUTROS ABS: 5.6 10*3/uL (ref 1.7–7.7)
Neutrophils Relative %: 59 % (ref 43–77)
Platelets: 233 10*3/uL (ref 150–400)
RBC: 6.16 MIL/uL — ABNORMAL HIGH (ref 4.22–5.81)
RDW: 14.8 % (ref 11.5–15.5)
WBC: 9.5 10*3/uL (ref 4.0–10.5)

## 2014-04-19 LAB — BASIC METABOLIC PANEL
BUN: 21 mg/dL (ref 6–23)
CO2: 24 mEq/L (ref 19–32)
Calcium: 10 mg/dL (ref 8.4–10.5)
Chloride: 100 mEq/L (ref 96–112)
Creat: 1.02 mg/dL (ref 0.50–1.35)
Glucose, Bld: 149 mg/dL — ABNORMAL HIGH (ref 70–99)
POTASSIUM: 5.3 meq/L (ref 3.5–5.3)
SODIUM: 138 meq/L (ref 135–145)

## 2014-04-19 LAB — C-REACTIVE PROTEIN: CRP: 0.8 mg/dL — AB (ref <0.60)

## 2014-04-20 LAB — SEDIMENTATION RATE: SED RATE: 1 mm/h (ref 0–16)

## 2014-04-22 ENCOUNTER — Ambulatory Visit: Payer: Self-pay | Attending: Internal Medicine | Admitting: Internal Medicine

## 2014-04-25 ENCOUNTER — Telehealth: Payer: Self-pay | Admitting: Emergency Medicine

## 2014-04-25 NOTE — Telephone Encounter (Signed)
Pt given lab results with instructions to continue monitoring carbohydrates,with taking medications. Pt also informed to continue prescribed antibiotics and return to clinic if s/o infections

## 2014-04-25 NOTE — Telephone Encounter (Signed)
Message copied by Darlis Loan on Thu Apr 25, 2014  5:22 PM ------      Message from: Jeanann Lewandowsky E      Created: Mon Apr 22, 2014  5:51 PM       Please inform patient that his laboratory tests results are mostly within normal limit except for his blood sugar. Encouraged patient to continue blood sugar control, continue antibiotics, return to the clinic if there is any evidence of infection in the foot such as redness, swelling, discharge, fever, excessive pain. In case the clinic is closed, patient should immediately go to the ED if any of these symptoms develop ------

## 2014-04-29 ENCOUNTER — Other Ambulatory Visit: Payer: Self-pay | Admitting: Internal Medicine

## 2014-04-29 ENCOUNTER — Telehealth: Payer: Self-pay | Admitting: Internal Medicine

## 2014-04-29 DIAGNOSIS — E118 Type 2 diabetes mellitus with unspecified complications: Secondary | ICD-10-CM

## 2014-04-29 MED ORDER — CIPRO 500 MG PO TABS: 500.0000 mg | ORAL_TABLET | Freq: Two times a day (BID) | ORAL | Status: DC

## 2014-04-29 MED ORDER — CLINDAMYCIN HCL 300 MG PO CAPS: 300.0000 mg | ORAL_CAPSULE | Freq: Three times a day (TID) | ORAL | Status: DC

## 2014-04-29 NOTE — Telephone Encounter (Signed)
Patient has come in today saying that he was told by PCP that he could walk in for an appointment to see about his foot; patient is in the lobby, please f/u with patient

## 2014-05-06 ENCOUNTER — Telehealth: Payer: Self-pay | Admitting: *Deleted

## 2014-05-06 NOTE — Telephone Encounter (Signed)
I'm supposed to get an appointment for my foot.  I'm being referred by the [facility].  Could you please give me a call to schedule an appointment.

## 2014-05-14 ENCOUNTER — Other Ambulatory Visit: Payer: Self-pay | Admitting: Emergency Medicine

## 2014-05-14 MED ORDER — METFORMIN HCL 1000 MG PO TABS: 1000.0000 mg | ORAL_TABLET | Freq: Two times a day (BID) | ORAL | Status: DC

## 2014-05-15 ENCOUNTER — Ambulatory Visit: Payer: Self-pay

## 2014-06-29 ENCOUNTER — Emergency Department (HOSPITAL_BASED_OUTPATIENT_CLINIC_OR_DEPARTMENT_OTHER): Payer: Self-pay

## 2014-06-29 ENCOUNTER — Emergency Department (HOSPITAL_BASED_OUTPATIENT_CLINIC_OR_DEPARTMENT_OTHER)
Admission: EM | Admit: 2014-06-29 | Discharge: 2014-06-29 | Disposition: A | Discharge status: Home / Self Care | Payer: Self-pay | Attending: Emergency Medicine | Admitting: Emergency Medicine

## 2014-06-29 ENCOUNTER — Encounter (HOSPITAL_BASED_OUTPATIENT_CLINIC_OR_DEPARTMENT_OTHER): Payer: Self-pay

## 2014-06-29 DIAGNOSIS — I10 Essential (primary) hypertension: Secondary | ICD-10-CM | POA: Insufficient documentation

## 2014-06-29 DIAGNOSIS — Z792 Long term (current) use of antibiotics: Secondary | ICD-10-CM | POA: Insufficient documentation

## 2014-06-29 DIAGNOSIS — Z7982 Long term (current) use of aspirin: Secondary | ICD-10-CM | POA: Insufficient documentation

## 2014-06-29 DIAGNOSIS — Z794 Long term (current) use of insulin: Secondary | ICD-10-CM | POA: Insufficient documentation

## 2014-06-29 DIAGNOSIS — M545 Low back pain: Secondary | ICD-10-CM | POA: Insufficient documentation

## 2014-06-29 DIAGNOSIS — E780 Pure hypercholesterolemia: Secondary | ICD-10-CM | POA: Insufficient documentation

## 2014-06-29 DIAGNOSIS — Z79891 Long term (current) use of opiate analgesic: Secondary | ICD-10-CM | POA: Insufficient documentation

## 2014-06-29 DIAGNOSIS — Z79899 Other long term (current) drug therapy: Secondary | ICD-10-CM | POA: Insufficient documentation

## 2014-06-29 DIAGNOSIS — N200 Calculus of kidney: Secondary | ICD-10-CM | POA: Insufficient documentation

## 2014-06-29 DIAGNOSIS — E119 Type 2 diabetes mellitus without complications: Secondary | ICD-10-CM | POA: Insufficient documentation

## 2014-06-29 HISTORY — DX: Calculus of kidney: N20.0

## 2014-06-29 LAB — BASIC METABOLIC PANEL
Anion gap: 14 (ref 5–15)
BUN: 26 mg/dL — ABNORMAL HIGH (ref 6–23)
CO2: 25 mEq/L (ref 19–32)
Calcium: 9.3 mg/dL (ref 8.4–10.5)
Chloride: 102 mEq/L (ref 96–112)
Creatinine, Ser: 1.1 mg/dL (ref 0.50–1.35)
GFR calc Af Amer: 87 mL/min — ABNORMAL LOW (ref >90)
GFR calc non Af Amer: 75 mL/min — ABNORMAL LOW (ref >90)
Glucose, Bld: 144 mg/dL — ABNORMAL HIGH (ref 70–99)
Potassium: 4.4 mEq/L (ref 3.7–5.3)
Sodium: 141 mEq/L (ref 137–147)

## 2014-06-29 LAB — URINALYSIS, ROUTINE W REFLEX MICROSCOPIC
BILIRUBIN URINE: NEGATIVE
Glucose, UA: NEGATIVE mg/dL
Ketones, ur: NEGATIVE mg/dL
LEUKOCYTES UA: NEGATIVE
NITRITE: NEGATIVE
Protein, ur: 30 mg/dL — AB
SPECIFIC GRAVITY, URINE: 1.019 (ref 1.005–1.030)
UROBILINOGEN UA: 0.2 mg/dL (ref 0.0–1.0)
pH: 5 (ref 5.0–8.0)

## 2014-06-29 LAB — URINE MICROSCOPIC-ADD ON

## 2014-06-29 MED ORDER — FENTANYL CITRATE 0.05 MG/ML IJ SOLN
50.0000 ug | Freq: Once | INTRAMUSCULAR | Status: AC
Start: 2014-06-29 — End: 2014-06-29
  Administered 2014-06-29: 50 ug via INTRAVENOUS
  Filled 2014-06-29: qty 2

## 2014-06-29 MED ORDER — OXYCODONE-ACETAMINOPHEN 5-325 MG PO TABS: 1.0000 | ORAL_TABLET | ORAL | Status: DC | PRN

## 2014-06-29 MED ORDER — ONDANSETRON HCL 4 MG/2ML IJ SOLN
4.0000 mg | Freq: Once | INTRAMUSCULAR | Status: AC
  Administered 2014-06-29: 4 mg via INTRAVENOUS
  Filled 2014-06-29: qty 2

## 2014-06-29 MED ORDER — ONDANSETRON 4 MG PO TBDP: ORAL_TABLET | ORAL | Status: DC

## 2014-06-29 MED ORDER — TAMSULOSIN HCL 0.4 MG PO CAPS: 0.4000 mg | ORAL_CAPSULE | Freq: Every day | ORAL | Status: DC

## 2014-06-29 NOTE — ED Notes (Signed)
Patient here with left flank pain x 2 days that has worsened the past 2 hours, pale on assessment, no nausea or vomiting. Remote hx of stones

## 2014-06-29 NOTE — ED Provider Notes (Signed)
CSN: [id number]     Arrival date & time 06/29/14  1044 History   First MD Initiated Contact with Patient 06/29/14 1149     Chief Complaint  Patient presents with  . Flank Pain     (Consider location/radiation/quality/duration/timing/severity/associated sxs/prior Treatment) HPI Comments: Patient presents with left flank pain. He states over the last 2 days he's had some intermittent pain to his left back. He states this morning it was more intense and persistent. It is radiating to his left midabdomen. He has no testicular pain. He has no difficulty urinating. He denies any nausea or vomiting. There is no fevers or chills. He has a remote history of kidney stones with the last one being about 22 years ago. He does not have a urologist. [facility].  Patient is a 53 y.o. male presenting with flank pain.  Flank Pain Associated symptoms include abdominal pain. Pertinent negatives include no chest pain, no headaches and no shortness of breath.    Past Medical History  Diagnosis Date  . Hypertension   . Diabetes mellitus without complication   . High cholesterol   . Kidney stone    Past Surgical History  Procedure Laterality Date  . Colonoscopy N/A 07/12/2013    Procedure: COLONOSCOPY;  Surgeon: Shirley FriarVincent C. Schooler, MD;  Location: [facility];  Service: Endoscopy;  Laterality: N/A;   No family history on file. History  Substance Use Topics  . Smoking status: Never Smoker   . Smokeless tobacco: Not on file  . Alcohol Use: No    Review of Systems  Constitutional: Negative for fever, chills, diaphoresis and fatigue.  HENT: Negative for congestion, rhinorrhea and sneezing.   Eyes: Negative.   Respiratory: Negative for cough, chest tightness and shortness of breath.   Cardiovascular: Negative for chest pain and leg swelling.  Gastrointestinal: Positive for abdominal pain. Negative for nausea, vomiting, diarrhea and blood in  stool.  Genitourinary: Positive for flank pain. Negative for frequency, hematuria and difficulty urinating.  Musculoskeletal: Positive for back pain. Negative for arthralgias.  Skin: Negative for rash.  Neurological: Negative for dizziness, speech difficulty, weakness, numbness and headaches.      Allergies  Review of patient's allergies indicates no known allergies.  Home Medications   Prior to Admission medications   Medication Sig Start Date End Date Taking? Authorizing Provider  acetaminophen-codeine (TYLENOL #3) 300-30 MG per tablet Take 1 tablet by mouth every 4 (four) hours as needed. 04/18/14   Quentin Angstlugbemiga E Jegede, MD  Albuterol (PROVENTIL IN) Inhale into the lungs.    Historical Provider, MD  aspirin 81 MG tablet Take 81 mg by mouth daily.    Historical Provider, MD  atorvastatin (LIPITOR) 20 MG tablet Take 1 tablet (20 mg total) by mouth daily. 04/09/13   Shanker Levora DredgeM Ghimire, MD  carvedilol (COREG) 25 MG tablet Take 1 tablet (25 mg total) by mouth 2 (two) times daily with a meal. 01/08/14   Quentin Angstlugbemiga E Jegede, MD  clindamycin (CLEOCIN) 300 MG capsule Take 1 capsule (300 mg total) by mouth 3 (three) times daily. 04/29/14   Quentin Angstlugbemiga E Jegede, MD  esomeprazole (NEXIUM) 40 MG capsule Take 1 capsule (40 mg total) by mouth daily. 08/07/13   Quentin Angstlugbemiga E Jegede, MD  gabapentin (NEURONTIN) 300 MG capsule Take 1 capsule (300 mg total) by mouth at bedtime. 04/09/13   Shanker Levora DredgeM Ghimire, MD  hydrochlorothiazide (HYDRODIURIL) 25 MG tablet Take 1 tablet (25 mg total) by  mouth daily. 11/05/13   Quentin Angst, MD  insulin aspart (NOVOLOG) 100 UNIT/ML injection Inject 10 Units into the skin 2 (two) times daily with a meal. 11/05/13   Olugbemiga E Hyman Hopes, MD  insulin glargine (LANTUS) 100 UNIT/ML injection Inject 0.3 mLs (30 Units total) into the skin at bedtime. 11/05/13   Quentin Angst, MD  loratadine (CLARITIN) 10 MG tablet Take 1 tablet (10 mg total) by mouth daily. 11/05/13   Quentin Angst,  MD  metFORMIN (GLUCOPHAGE) 1000 MG tablet Take 1 tablet (1,000 mg total) by mouth 2 (two) times daily with a meal. 05/14/14   Olugbemiga E Hyman Hopes, MD  olmesartan (BENICAR) 40 MG tablet Take 1 tablet (40 mg total) by mouth daily. 11/05/13   Quentin Angst, MD  ondansetron (ZOFRAN ODT) 4 MG disintegrating tablet 4mg  ODT q4 hours prn nausea/vomit 06/29/14   Rolan Bucco, MD  oxyCODONE-acetaminophen (PERCOCET) 5-325 MG per tablet Take 1-2 tablets by mouth every 4 (four) hours as needed. 06/29/14   Rolan Bucco, MD  polyethylene glycol powder (GLYCOLAX/MIRALAX) powder Take 17 g by mouth 2 (two) times daily as needed. 04/09/13   Shanker Levora Dredge, MD  pregabalin (LYRICA) 50 MG capsule Take 50 mg by mouth 3 (three) times daily.    Historical Provider, MD  tadalafil (CIALIS) 20 MG tablet Take 0.5-1 tablets (10-20 mg total) by mouth every other day as needed for erectile dysfunction. 11/05/13   Quentin Angst, MD  tamsulosin (FLOMAX) 0.4 MG CAPS capsule Take 1 capsule (0.4 mg total) by mouth daily. 06/29/14   Rolan Bucco, MD   BP 131/79 mmHg  Pulse 80  Temp(Src) 98.1 F (36.7 C) (Oral)  Resp 20  Ht 5\' 7"  (1.702 m)  Wt 240 lb (108.863 kg)  BMI 37.58 kg/m2  SpO2 95% Physical Exam  Constitutional: He is oriented to person, place, and time. He appears well-developed and well-nourished.  HENT:  Head: Normocephalic and atraumatic.  Eyes: Pupils are equal, round, and reactive to light.  Neck: Normal range of motion. Neck supple.  Cardiovascular: Normal rate, regular rhythm and normal heart sounds.   Pulmonary/Chest: Effort normal and breath sounds normal. No respiratory distress. He has no wheezes. He has no rales. He exhibits no tenderness.  Abdominal: Soft. Bowel sounds are normal. There is no tenderness. There is no rebound and no guarding.  I examined patient after he had received pain medication he is nontender on exam  Musculoskeletal: Normal range of motion. He exhibits no edema.   Lymphadenopathy:    He has no cervical adenopathy.  Neurological: He is alert and oriented to person, place, and time.  Skin: Skin is warm and dry. No rash noted.  Psychiatric: He has a normal mood and affect.    ED Course  Procedures (including critical care time) Labs Review Labs Reviewed  URINALYSIS, ROUTINE W REFLEX MICROSCOPIC - Abnormal; Notable for the following:    Hgb urine dipstick MODERATE (*)    Protein, ur 30 (*)    All other components within normal limits  BASIC METABOLIC PANEL - Abnormal; Notable for the following:    Glucose, Bld 144 (*)    BUN 26 (*)    GFR calc non Af Amer 75 (*)    GFR calc Af Amer 87 (*)    All other components within normal limits  URINE MICROSCOPIC-ADD ON    Imaging Review Ct Renal Stone Study  06/29/2014   CLINICAL DATA:  Left flank pain for the past  2 days, worse today. History of nephrolithiasis.  EXAM: CT ABDOMEN AND PELVIS WITHOUT CONTRAST  TECHNIQUE: Multidetector CT imaging of the abdomen and pelvis was performed following the standard protocol without IV contrast.  COMPARISON:  Abdomen radiographs dated 07/04/2005.  FINDINGS: Diffusely enlarged heart. Dense atheromatous coronary artery calcifications. 1.1 cm gallstone in the gallbladder neck without gallbladder dilatation or pericholecystic fluid. Mild gallbladder wall thickening.  11 mm lower pole right renal calculus. 5 mm lower pole left renal calculus. Mild dilatation of the left renal collecting system and ureter to the level of an 8 mm distal left ureteral calculus 1.5 cm proximal to the ureterovesical junction. This is not visible on the scout image. No bladder or right ureteral calculi and no hydronephrosis on the right.  Normal-sized prostate gland. Small bilateral inguinal hernias containing fat. No gastrointestinal abnormalities or enlarged lymph nodes. Normal appearing appendix. Unremarkable non contrasted appearance of the liver, spleen, pancreas and adrenal glands. Small  amount of linear atelectasis or scarring at the left lung base. Lumbar and lower thoracic spine degenerative changes. Bilateral L5 pars interarticularis defects without anterolisthesis.  IMPRESSION: 1. 8 mm distal left ureteral calculus causing mild left hydronephrosis and hydroureter. 2. Bilateral nonobstructing renal calculi. 3. Dense atheromatous coronary artery calcifications. 4. Cardiomegaly. 5. Small bilateral inguinal hernias containing fat. 6. Bilateral L5 spondylolysis.   Electronically Signed   By: Gordan Payment M.D.   On: 06/29/2014 13:22     EKG Interpretation None      MDM   Final diagnoses:  Kidney stone    Patient is currently pain-free. He has a large distal left ureteral stone. His creatinine is normal. He was discharged home in good condition with a prescription for Percocet Zofran and Flomax. He was given referral to follow-up with [facility]. He was given return precautions.    Rolan Bucco, MD 06/29/14 [phone]

## 2014-06-29 NOTE — Discharge Instructions (Signed)

## 2014-07-01 ENCOUNTER — Telehealth: Payer: Self-pay | Admitting: Internal Medicine

## 2014-07-01 NOTE — Telephone Encounter (Signed)
Patient has come in to the clinic requesting a referral to urology for a kidney stone; patient is following up from the ED and is requesting a referral before his next OV on 12/17 with Dr. Hyman Hopes; please f/u with patient to coordinate the best plan of care

## 2014-07-09 ENCOUNTER — Encounter: Payer: Self-pay | Admitting: Internal Medicine

## 2014-07-09 ENCOUNTER — Ambulatory Visit: Payer: Self-pay | Attending: Internal Medicine | Admitting: Internal Medicine

## 2014-07-09 VITALS — BP 133/87 | HR 80 | Temp 97.8°F | Resp 20 | Ht 67.5 in | Wt 251.8 lb

## 2014-07-09 DIAGNOSIS — N521 Erectile dysfunction due to diseases classified elsewhere: Secondary | ICD-10-CM | POA: Insufficient documentation

## 2014-07-09 DIAGNOSIS — E1169 Type 2 diabetes mellitus with other specified complication: Secondary | ICD-10-CM | POA: Insufficient documentation

## 2014-07-09 DIAGNOSIS — Z23 Encounter for immunization: Secondary | ICD-10-CM | POA: Insufficient documentation

## 2014-07-09 NOTE — Progress Notes (Signed)
Patient ID: Benjamin [name], male   DOB: 1961-07-01, 53 y.o.   MRN: [id number]   Benjamin [name], is a 53 y.o. male  EKC:[id number]  [id number]:[id number]  DOB - 05-05-1961  Chief Complaint  Patient presents with  . Nephrolithiasis    Urology referral        Subjective:   Benjamin [name] is a 53 y.o. male here today for a follow up visit. Patient has hypertension, diabetes mellitus without complication, dyslipidemia and kidney stone. Patient is here today requesting a urology referral for erectile dysfunction and for his kidney stones, he claimed he passed his kidney stone 5 days ago. His erectile dysfunction is multifactorial. He never smoked cigarettes. He is also requesting flu vaccination. His blood pressure is under control today. Patient has No headache, No chest pain, No abdominal pain - No Nausea, No new weakness tingling or numbness, No Cough - SOB.  No problems updated.  ALLERGIES: No Known Allergies  PAST MEDICAL HISTORY: Past Medical History  Diagnosis Date  . Hypertension   . Diabetes mellitus without complication   . High cholesterol   . Kidney stone     MEDICATIONS AT HOME: Prior to Admission medications   Medication Sig Start Date End Date Taking? Authorizing Provider  aspirin 81 MG tablet Take 81 mg by mouth daily.   Yes Historical Provider, MD  atorvastatin (LIPITOR) 20 MG tablet Take 1 tablet (20 mg total) by mouth daily. 04/09/13  Yes Shanker Levora Dredge, MD  carvedilol (COREG) 25 MG tablet Take 1 tablet (25 mg total) by mouth 2 (two) times daily with a meal. 01/08/14  Yes Rosi Secrist E Hyman Hopes, MD  esomeprazole (NEXIUM) 40 MG capsule Take 1 capsule (40 mg total) by mouth daily. 08/07/13  Yes Quentin Angst, MD  gabapentin (NEURONTIN) 300 MG capsule Take 1 capsule (300 mg total) by mouth at bedtime. 04/09/13  Yes Shanker Levora Dredge, MD  hydrochlorothiazide (HYDRODIURIL) 25 MG tablet Take 1 tablet (25 mg total) by mouth daily. 11/05/13  Yes Quentin Angst, MD  insulin aspart (NOVOLOG)  100 UNIT/ML injection Inject 10 Units into the skin 2 (two) times daily with a meal. 11/05/13  Yes Destyne Goodreau E Hyman Hopes, MD  insulin glargine (LANTUS) 100 UNIT/ML injection Inject 0.3 mLs (30 Units total) into the skin at bedtime. 11/05/13  Yes Quentin Angst, MD  loratadine (CLARITIN) 10 MG tablet Take 1 tablet (10 mg total) by mouth daily. 11/05/13  Yes Quentin Angst, MD  metFORMIN (GLUCOPHAGE) 1000 MG tablet Take 1 tablet (1,000 mg total) by mouth 2 (two) times daily with a meal. 05/14/14  Yes Aleesia Henney E Hyman Hopes, MD  olmesartan (BENICAR) 40 MG tablet Take 1 tablet (40 mg total) by mouth daily. 11/05/13  Yes Quentin Angst, MD  acetaminophen-codeine (TYLENOL #3) 300-30 MG per tablet Take 1 tablet by mouth every 4 (four) hours as needed. Patient not taking: Reported on 07/09/2014 04/18/14   Quentin Angst, MD  Albuterol (PROVENTIL IN) Inhale into the lungs.    Historical Provider, MD  clindamycin (CLEOCIN) 300 MG capsule Take 1 capsule (300 mg total) by mouth 3 (three) times daily. Patient not taking: Reported on 07/09/2014 04/29/14   Quentin Angst, MD  ondansetron (ZOFRAN ODT) 4 MG disintegrating tablet 4mg  ODT q4 hours prn nausea/vomit 06/29/14   Rolan Bucco, MD  oxyCODONE-acetaminophen (PERCOCET) 5-325 MG per tablet Take 1-2 tablets by mouth every 4 (four) hours as needed. 06/29/14   Rolan Bucco, MD  polyethylene glycol powder (GLYCOLAX/MIRALAX)  powder Take 17 g by mouth 2 (two) times daily as needed. 04/09/13   Shanker Levora DredgeM Ghimire, MD  pregabalin (LYRICA) 50 MG capsule Take 50 mg by mouth 3 (three) times daily.    Historical Provider, MD  tadalafil (CIALIS) 20 MG tablet Take 0.5-1 tablets (10-20 mg total) by mouth every other day as needed for erectile dysfunction. 11/05/13   Quentin Angstlugbemiga E Jashley Yellin, MD  tamsulosin (FLOMAX) 0.4 MG CAPS capsule Take 1 capsule (0.4 mg total) by mouth daily. 06/29/14   Rolan BuccoMelanie Belfi, MD     Objective:   Filed Vitals:   07/09/14 1211  BP: 133/87    Pulse: 80  Temp: 97.8 F (36.6 C)  TempSrc: Oral  Resp: 20  Height: 5' 7.5" (1.715 m)  Weight: 251 lb 12.8 oz (114.216 kg)  SpO2: 98%    Exam General appearance : Awake, alert, not in any distress. Speech Clear. Not toxic looking HEENT: Atraumatic and Normocephalic, pupils equally reactive to light and accomodation Neck: supple, no JVD. No cervical lymphadenopathy.  Chest:Good air entry bilaterally, no added sounds  CVS: S1 S2 regular, no murmurs.  Abdomen: Bowel sounds present, Non tender and not distended with no gaurding, rigidity or rebound. Extremities: B/L Lower Ext shows no edema, both legs are warm to touch Neurology: Awake alert, and oriented X 3, CN II-XII intact, Non focal Skin:No Rash Wounds:N/A  Data Review Lab Results  Component Value Date   HGBA1C 7.5 04/18/2014   HGBA1C 7.9 11/05/2013   HGBA1C 7.2 07/31/2013     Assessment & Plan   1. Need for prophylactic vaccination and inoculation against influenza Flu vaccine given  2. Erectile dysfunction associated with type 2 diabetes mellitus  - Ambulatory referral to Urology  Return in about 4 weeks (around 08/06/2014) for Hemoglobin A1C and Follow up, DM, Follow up HTN.  The patient was given clear instructions to go to ER or return to medical center if symptoms don't improve, worsen or new problems develop. The patient verbalized understanding. The patient was told to call to get lab results if they haven't heard anything in the next week.   This note has been created with Education officer, environmentalDragon speech recognition software and smart phrase technology. Any transcriptional errors are unintentional.    Jeanann LewandowskyJEGEDE, Lorita Forinash, MD, MHA, FACP, FAAP [facility] [place], [place] [phone]   07/09/2014, 12:50 PM

## 2014-07-09 NOTE — Patient Instructions (Signed)
Basic Carbohydrate Counting for Diabetes Mellitus Carbohydrate counting is a method for keeping track of the amount of carbohydrates you eat. Eating carbohydrates naturally increases the level of sugar (glucose) in your blood, so it is important for you to know the amount that is okay for you to have in every meal. Carbohydrate counting helps keep the level of glucose in your blood within normal limits. The amount of carbohydrates allowed is different for every person. A dietitian can help you calculate the amount that is right for you. Once you know the amount of carbohydrates you can have, you can count the carbohydrates in the foods you want to eat. Carbohydrates are found in the following foods:  Grains, such as breads and cereals.  Dried beans and soy products.  Starchy vegetables, such as potatoes, peas, and corn.  Fruit and fruit juices.  Milk and yogurt.  Sweets and snack foods, such as cake, cookies, candy, chips, soft drinks, and fruit drinks. CARBOHYDRATE COUNTING There are two ways to count the carbohydrates in your food. You can use either of the methods or a combination of both. Reading the "Nutrition Facts" on Packaged Food The "Nutrition Facts" is an area that is included on the labels of almost all packaged food and beverages in the [place]. It includes the serving size of that food or beverage and information about the nutrients in each serving of the food, including the grams (g) of carbohydrate per serving.  Decide the number of servings of this food or beverage that you will be able to eat or drink. Multiply that number of servings by the number of grams of carbohydrate that is listed on the label for that serving. The total will be the amount of carbohydrates you will be having when you eat or drink this food or beverage. Learning Standard Serving Sizes of Food When you eat food that is not packaged or does not include "Nutrition Facts" on the label, you need to  measure the servings in order to count the amount of carbohydrates.A serving of most carbohydrate-rich foods contains about 15 g of carbohydrates. The following list includes serving sizes of carbohydrate-rich foods that provide 15 g ofcarbohydrate per serving:   1 slice of bread (1 oz) or 1 six-inch tortilla.    of a hamburger bun or English muffin.  4-6 crackers.   cup unsweetened dry cereal.    cup hot cereal.   cup rice or pasta.    cup mashed potatoes or  of a large baked potato.  1 cup fresh fruit or one small piece of fruit.    cup canned or frozen fruit or fruit juice.  1 cup milk.   cup plain fat-free yogurt or yogurt sweetened with artificial sweeteners.   cup cooked dried beans or starchy vegetable, such as peas, corn, or potatoes.  Decide the number of standard-size servings that you will eat. Multiply that number of servings by 15 (the grams of carbohydrates in that serving). For example, if you eat 2 cups of strawberries, you will have eaten 2 servings and 30 g of carbohydrates (2 servings x 15 g = 30 g). For foods such as soups and casseroles, in which more than one food is mixed in, you will need to count the carbohydrates in each food that is included. EXAMPLE OF CARBOHYDRATE COUNTING Sample Dinner  3 oz chicken breast.   cup of brown rice.   cup of corn.  1 cup milk.   1 cup strawberries with   sugar-free whipped topping.  Carbohydrate Calculation Step 1: Identify the foods that contain carbohydrates:   Rice.   Corn.   Milk.   Strawberries. Step 2:Calculate the number of servings eaten of each:   2 servings of rice.   1 serving of corn.   1 serving of milk.   1 serving of strawberries. Step 3: Multiply each of those number of servings by 15 g:   2 servings of rice x 15 g = 30 g.   1 serving of corn x 15 g = 15 g.   1 serving of milk x 15 g = 15 g.   1 serving of strawberries x 15 g = 15 g. Step 4: Add  together all of the amounts to find the total grams of carbohydrates eaten: 30 g + 15 g + 15 g + 15 g = 75 g. Document Released: 07/19/2005 Document Revised: 12/03/2013 Document Reviewed: 06/15/2013 ExitCare Patient Information 2015 [employer]. This information is not intended to replace advice given to you by your health care provider. Make sure you discuss any questions you have with your health care provider. Diabetes and Exercise Exercising regularly is important. It is not just about losing weight. It has many health benefits, such as:  Improving your overall fitness, flexibility, and endurance.  Increasing your bone density.  Helping with weight control.  Decreasing your body fat.  Increasing your muscle strength.  Reducing stress and tension.  Improving your overall health. People with diabetes who exercise gain additional benefits because exercise:  Reduces appetite.  Improves the body's use of blood sugar (glucose).  Helps lower or control blood glucose.  Decreases blood pressure.  Helps control blood lipids (such as cholesterol and triglycerides).  Improves the body's use of the hormone insulin by:  Increasing the body's insulin sensitivity.  Reducing the body's insulin needs.  Decreases the risk for heart disease because exercising:  Lowers cholesterol and triglycerides levels.  Increases the levels of good cholesterol (such as high-density lipoproteins [HDL]) in the body.  Lowers blood glucose levels. YOUR ACTIVITY PLAN  Choose an activity that you enjoy and set realistic goals. Your health care provider or diabetes educator can help you make an activity plan that works for you. Exercise regularly as directed by your health care provider. This includes:  Performing resistance training twice a week such as push-ups, sit-ups, lifting weights, or using resistance bands.  Performing 150 minutes of cardio exercises each week such as walking, running, or  playing sports.  Staying active and spending no more than 90 minutes at one time being inactive. Even short bursts of exercise are good for you. Three 10-minute sessions spread throughout the day are just as beneficial as a single 30-minute session. Some exercise ideas include:  Taking the dog for a walk.  Taking the stairs instead of the elevator.  Dancing to your favorite song.  Doing an exercise video.  Doing your favorite exercise with a friend. RECOMMENDATIONS FOR EXERCISING WITH TYPE 1 OR TYPE 2 DIABETES   Check your blood glucose before exercising. If blood glucose levels are greater than 240 mg/dL, check for urine ketones. Do not exercise if ketones are present.  Avoid injecting insulin into areas of the body that are going to be exercised. For example, avoid injecting insulin into:  The arms when playing tennis.  The legs when jogging.  Keep a record of:  Food intake before and after you exercise.  Expected peak times of insulin action.  Blood   glucose levels before and after you exercise.  The type and amount of exercise you have done.  Review your records with your health care provider. Your health care provider will help you to develop guidelines for adjusting food intake and insulin amounts before and after exercising.  If you take insulin or oral hypoglycemic agents, watch for signs and symptoms of hypoglycemia. They include:  Dizziness.  Shaking.  Sweating.  Chills.  Confusion.  Drink plenty of water while you exercise to prevent dehydration or heat stroke. Body water is lost during exercise and must be replaced.  Talk to your health care provider before starting an exercise program to make sure it is safe for you. Remember, almost any type of activity is better than none. Document Released: 10/09/2003 Document Revised: 12/03/2013 Document Reviewed: 12/26/2012 ExitCare Patient Information 2015 [employer]. This information is not intended to  replace advice given to you by your health care provider. Make sure you discuss any questions you have with your health care provider.  

## 2014-07-09 NOTE — Progress Notes (Signed)
Patient presents for urology referral following passage of kidney stone 5 days ago Never smoked Reports fasting CBG this AM 110 Flu vaccine given  Taking benicar 20 mg bid Taking Novolog insulin 10 units with breakfast and 15 units with lunch  BP 133/87

## 2014-07-18 ENCOUNTER — Ambulatory Visit: Payer: Self-pay | Admitting: Internal Medicine

## 2014-07-22 ENCOUNTER — Encounter: Payer: Self-pay | Admitting: Internal Medicine

## 2014-07-22 ENCOUNTER — Ambulatory Visit: Payer: Self-pay | Attending: Internal Medicine | Admitting: Internal Medicine

## 2014-07-22 ENCOUNTER — Ambulatory Visit (HOSPITAL_COMMUNITY)
Admission: RE | Admit: 2014-07-22 | Discharge: 2014-07-22 | Disposition: A | Discharge status: Home / Self Care | Payer: Self-pay | Source: Ambulatory Visit | Attending: Internal Medicine | Admitting: Internal Medicine

## 2014-07-22 VITALS — BP 148/99 | HR 85 | Temp 98.0°F | Resp 16 | Ht 67.0 in | Wt 250.0 lb

## 2014-07-22 DIAGNOSIS — S91302A Unspecified open wound, left foot, initial encounter: Secondary | ICD-10-CM | POA: Insufficient documentation

## 2014-07-22 DIAGNOSIS — I10 Essential (primary) hypertension: Secondary | ICD-10-CM | POA: Insufficient documentation

## 2014-07-22 DIAGNOSIS — Z9114 Patient's other noncompliance with medication regimen: Secondary | ICD-10-CM | POA: Insufficient documentation

## 2014-07-22 DIAGNOSIS — R0600 Dyspnea, unspecified: Secondary | ICD-10-CM | POA: Insufficient documentation

## 2014-07-22 DIAGNOSIS — R0602 Shortness of breath: Secondary | ICD-10-CM | POA: Insufficient documentation

## 2014-07-22 DIAGNOSIS — E780 Pure hypercholesterolemia: Secondary | ICD-10-CM | POA: Insufficient documentation

## 2014-07-22 DIAGNOSIS — J984 Other disorders of lung: Secondary | ICD-10-CM | POA: Insufficient documentation

## 2014-07-22 DIAGNOSIS — Z794 Long term (current) use of insulin: Secondary | ICD-10-CM | POA: Insufficient documentation

## 2014-07-22 DIAGNOSIS — Y93A1 Activity, exercise machines primarily for cardiorespiratory conditioning: Secondary | ICD-10-CM | POA: Insufficient documentation

## 2014-07-22 DIAGNOSIS — E119 Type 2 diabetes mellitus without complications: Secondary | ICD-10-CM | POA: Insufficient documentation

## 2014-07-22 DIAGNOSIS — I2510 Atherosclerotic heart disease of native coronary artery without angina pectoris: Secondary | ICD-10-CM | POA: Insufficient documentation

## 2014-07-22 DIAGNOSIS — Z79899 Other long term (current) drug therapy: Secondary | ICD-10-CM | POA: Insufficient documentation

## 2014-07-22 DIAGNOSIS — I517 Cardiomegaly: Secondary | ICD-10-CM | POA: Insufficient documentation

## 2014-07-22 DIAGNOSIS — S91301A Unspecified open wound, right foot, initial encounter: Secondary | ICD-10-CM | POA: Insufficient documentation

## 2014-07-22 LAB — POCT GLYCOSYLATED HEMOGLOBIN (HGB A1C): Hemoglobin A1C: 7.4

## 2014-07-22 NOTE — Progress Notes (Signed)
Patient ID: Benjamin [name], male   DOB: June 30, 1961, 53 y.o.   MRN: [id number]   Vesta MixerHatem [name], is a 53 y.o. male  [id number]  [id number]  DOB - June 30, 1961  Chief Complaint  Patient presents with  . Follow-up        Subjective:   Benjamin [name] is a 53 y.o. male here today for a follow up visit. Patient has history of diabetes mellitus, hypertension, dyslipidemia, recently treated for bilateral foot ulcers from treadmill injury. Patient is here today for routine follow-up. His major complaint is ongoing recently developed shortness of breath and occasional cough. He does not have history of COPD or asthma. He also described a possible paroxysmal nocturnal dyspnea but denies orthopnea. He said he has been told about 7 years ago that he needs to see a cardiologist [place] but because he was feeling good he never bothered. He has no personal history of congestive heart failure but has long-standing history of hypertension. Patient is on Benicar 40 mg tablet by mouth daily, hydrochlorothiazide 25 mg tablet by mouth daily and carvedilol 25 mg tablet by mouth twice a day. Patient has not been compliant with medications as prescribed, he told me he takes these medications based on his body symptoms, some days when he feels dizzy or some unusual feelings he will take either half of the tablet or not at all. He does not smoke cigarette, he does not drink alcohol. Patient has No headache, No chest pain, No abdominal pain - No Nausea, No new weakness tingling or numbness  Problem  Dyspnea    ALLERGIES: No Known Allergies  PAST MEDICAL HISTORY: Past Medical History  Diagnosis Date  . Hypertension   . Diabetes mellitus without complication   . High cholesterol   . Kidney stone     MEDICATIONS AT HOME: Prior to Admission medications   Medication Sig Start Date End Date Taking? Authorizing Provider  Albuterol (PROVENTIL IN) Inhale into the lungs.   Yes Historical Provider, MD    aspirin 81 MG tablet Take 81 mg by mouth daily.   Yes Historical Provider, MD  atorvastatin (LIPITOR) 20 MG tablet Take 1 tablet (20 mg total) by mouth daily. 04/09/13  Yes Shanker Levora DredgeM Ghimire, MD  carvedilol (COREG) 25 MG tablet Take 1 tablet (25 mg total) by mouth 2 (two) times daily with a meal. 01/08/14  Yes [place] Retana E Hyman HopesJegede, MD  esomeprazole (NEXIUM) 40 MG capsule Take 1 capsule (40 mg total) by mouth daily. 08/07/13  Yes Quentin Angstlugbemiga E Shuayb Schepers, MD  gabapentin (NEURONTIN) 300 MG capsule Take 1 capsule (300 mg total) by mouth at bedtime. 04/09/13  Yes Shanker Levora DredgeM Ghimire, MD  hydrochlorothiazide (HYDRODIURIL) 25 MG tablet Take 1 tablet (25 mg total) by mouth daily. 11/05/13  Yes Quentin Angstlugbemiga E Okley Magnussen, MD  insulin aspart (NOVOLOG) 100 UNIT/ML injection Inject 10 Units into the skin 2 (two) times daily with a meal. 11/05/13  Yes Exavior Kimmons E Hyman HopesJegede, MD  insulin glargine (LANTUS) 100 UNIT/ML injection Inject 0.3 mLs (30 Units total) into the skin at bedtime. 11/05/13  Yes Quentin Angstlugbemiga E Lesbia Ottaway, MD  loratadine (CLARITIN) 10 MG tablet Take 1 tablet (10 mg total) by mouth daily. 11/05/13  Yes Quentin Angstlugbemiga E Telitha Plath, MD  metFORMIN (GLUCOPHAGE) 1000 MG tablet Take 1 tablet (1,000 mg total) by mouth 2 (two) times daily with a meal. 05/14/14  Yes Rebecca Cairns E Hyman HopesJegede, MD  olmesartan (BENICAR) 40 MG tablet Take 1 tablet (40 mg total) by mouth daily. 11/05/13  Yes Quentin Angst, MD  ondansetron (ZOFRAN ODT) 4 MG disintegrating tablet 4mg  ODT q4 hours prn nausea/vomit 06/29/14  Yes Rolan Bucco, MD  pregabalin (LYRICA) 50 MG capsule Take 50 mg by mouth 3 (three) times daily.   Yes Historical Provider, MD  acetaminophen-codeine (TYLENOL #3) 300-30 MG per tablet Take 1 tablet by mouth every 4 (four) hours as needed. Patient not taking: Reported on 07/09/2014 04/18/14   Quentin Angst, MD  clindamycin (CLEOCIN) 300 MG capsule Take 1 capsule (300 mg total) by mouth 3 (three) times daily. Patient not taking: Reported on 07/09/2014  04/29/14   Quentin Angst, MD  oxyCODONE-acetaminophen (PERCOCET) 5-325 MG per tablet Take 1-2 tablets by mouth every 4 (four) hours as needed. Patient not taking: Reported on 07/22/2014 06/29/14   Rolan Bucco, MD  polyethylene glycol powder (GLYCOLAX/MIRALAX) powder Take 17 g by mouth 2 (two) times daily as needed. Patient not taking: Reported on 07/22/2014 04/09/13   Maretta Bees, MD  tadalafil (CIALIS) 20 MG tablet Take 0.5-1 tablets (10-20 mg total) by mouth every other day as needed for erectile dysfunction. Patient not taking: Reported on 07/22/2014 11/05/13   Quentin Angst, MD  tamsulosin (FLOMAX) 0.4 MG CAPS capsule Take 1 capsule (0.4 mg total) by mouth daily. Patient not taking: Reported on 07/22/2014 06/29/14   Rolan Bucco, MD     Objective:   Filed Vitals:   07/22/14 1534  BP: 148/99  Pulse: 85  Temp: 98 F (36.7 C)  TempSrc: Oral  Resp: 16  Height: 5\' 7"  (1.702 m)  Weight: 250 lb (113.399 kg)  SpO2: 98%    Exam General appearance : Awake, alert, not in any distress. Speech Clear. Not toxic looking, obese  HEENT: Atraumatic and Normocephalic, pupils equally reactive to light and accomodation Neck: supple, no JVD. No cervical lymphadenopathy.  Chest:Good air entry bilaterally, no added sounds  CVS: S1 S2 regular, no murmurs.  Abdomen: Bowel sounds present, Non tender and not distended with no gaurding, rigidity or rebound. Extremities: B/L Lower Ext shows no edema, both legs are warm to touch Neurology: Awake alert, and oriented X 3, CN II-XII intact, Non focal Skin:No Rash Wounds:N/A  Data Review Lab Results  Component Value Date   HGBA1C 7.4 07/22/2014   HGBA1C 7.5 04/18/2014   HGBA1C 7.9 11/05/2013     Assessment & Plan   1. Type 2 diabetes mellitus without complication  - Glucose (CBG) - HgB A1c is 7.4% today, 7.5% 3 months ago. - Continue current diabetic regimen  2. Essential hypertension - Patient is noncompliant with  prescribed medications - We have again talked about blood pressure goal, complications of high blood pressure discussed the consequences of noncompliant with medications also emphasized. - DASH diet  3. Dyspnea  - 2D Echocardiogram with contrast; Future - DG Chest 2 View; Future  Patient has been counseled extensively about nutrition and exercise.  We will see patient back as a last echocardiogram and chest x-ray are done.  Return if symptoms worsen or fail to improve.  The patient was given clear instructions to go to ER or return to medical center if symptoms don't improve, worsen or new problems develop. The patient verbalized understanding. The patient was told to call to get lab results if they haven't heard anything in the next week.   This note has been created with Education officer, environmental. Any transcriptional errors are unintentional.    Dior Stepter, MD, MHA, FACP,  [facility] [place], [place] [phone]   07/22/2014, 4:07 PM

## 2014-07-22 NOTE — Progress Notes (Signed)
Pt is here to check his A!C. Pt reports that lately he get SOB when walking up hills.

## 2014-07-22 NOTE — Patient Instructions (Signed)
Basic Carbohydrate Counting for Diabetes Mellitus Carbohydrate counting is a method for keeping track of the amount of carbohydrates you eat. Eating carbohydrates naturally increases the level of sugar (glucose) in your blood, so it is important for you to know the amount that is okay for you to have in every meal. Carbohydrate counting helps keep the level of glucose in your blood within normal limits. The amount of carbohydrates allowed is different for every person. A dietitian can help you calculate the amount that is right for you. Once you know the amount of carbohydrates you can have, you can count the carbohydrates in the foods you want to eat. Carbohydrates are found in the following foods:  Grains, such as breads and cereals.  Dried beans and soy products.  Starchy vegetables, such as potatoes, peas, and corn.  Fruit and fruit juices.  Milk and yogurt.  Sweets and snack foods, such as cake, cookies, candy, chips, soft drinks, and fruit drinks. CARBOHYDRATE COUNTING There are two ways to count the carbohydrates in your food. You can use either of the methods or a combination of both. Reading the "Nutrition Facts" on Packaged Food The "Nutrition Facts" is an area that is included on the labels of almost all packaged food and beverages in the [place]. It includes the serving size of that food or beverage and information about the nutrients in each serving of the food, including the grams (g) of carbohydrate per serving.  Decide the number of servings of this food or beverage that you will be able to eat or drink. Multiply that number of servings by the number of grams of carbohydrate that is listed on the label for that serving. The total will be the amount of carbohydrates you will be having when you eat or drink this food or beverage. Learning Standard Serving Sizes of Food When you eat food that is not packaged or does not include "Nutrition Facts" on the label, you need to  measure the servings in order to count the amount of carbohydrates.A serving of most carbohydrate-rich foods contains about 15 g of carbohydrates. The following list includes serving sizes of carbohydrate-rich foods that provide 15 g ofcarbohydrate per serving:   1 slice of bread (1 oz) or 1 six-inch tortilla.    of a hamburger bun or English muffin.  4-6 crackers.   cup unsweetened dry cereal.    cup hot cereal.   cup rice or pasta.    cup mashed potatoes or  of a large baked potato.  1 cup fresh fruit or one small piece of fruit.    cup canned or frozen fruit or fruit juice.  1 cup milk.   cup plain fat-free yogurt or yogurt sweetened with artificial sweeteners.   cup cooked dried beans or starchy vegetable, such as peas, corn, or potatoes.  Decide the number of standard-size servings that you will eat. Multiply that number of servings by 15 (the grams of carbohydrates in that serving). For example, if you eat 2 cups of strawberries, you will have eaten 2 servings and 30 g of carbohydrates (2 servings x 15 g = 30 g). For foods such as soups and casseroles, in which more than one food is mixed in, you will need to count the carbohydrates in each food that is included. EXAMPLE OF CARBOHYDRATE COUNTING Sample Dinner  3 oz chicken breast.   cup of brown rice.   cup of corn.  1 cup milk.   1 cup strawberries with   sugar-free whipped topping.  Carbohydrate Calculation Step 1: Identify the foods that contain carbohydrates:   Rice.   Corn.   Milk.   Strawberries. Step 2:Calculate the number of servings eaten of each:   2 servings of rice.   1 serving of corn.   1 serving of milk.   1 serving of strawberries. Step 3: Multiply each of those number of servings by 15 g:   2 servings of rice x 15 g = 30 g.   1 serving of corn x 15 g = 15 g.   1 serving of milk x 15 g = 15 g.   1 serving of strawberries x 15 g = 15 g. Step 4: Add  together all of the amounts to find the total grams of carbohydrates eaten: 30 g + 15 g + 15 g + 15 g = 75 g. Document Released: 07/19/2005 Document Revised: 12/03/2013 Document Reviewed: 06/15/2013 ExitCare Patient Information 2015 [employer]. This information is not intended to replace advice given to you by your health care provider. Make sure you discuss any questions you have with your health care provider. DASH Eating Plan DASH stands for "Dietary Approaches to Stop Hypertension." The DASH eating plan is a healthy eating plan that has been shown to reduce high blood pressure (hypertension). Additional health benefits may include reducing the risk of type 2 diabetes mellitus, heart disease, and stroke. The DASH eating plan may also help with weight loss. WHAT DO I NEED TO KNOW ABOUT THE DASH EATING PLAN? For the DASH eating plan, you will follow these general guidelines:  Choose foods with a percent daily value for sodium of less than 5% (as listed on the food label).  Use salt-free seasonings or herbs instead of table salt or sea salt.  Check with your health care provider or pharmacist before using salt substitutes.  Eat lower-sodium products, often labeled as "lower sodium" or "no salt added."  Eat fresh foods.  Eat more vegetables, fruits, and low-fat dairy products.  Choose whole grains. Look for the word "whole" as the first word in the ingredient list.  Choose fish and skinless chicken or turkey more often than red meat. Limit fish, poultry, and meat to 6 oz (170 g) each day.  Limit sweets, desserts, sugars, and sugary drinks.  Choose heart-healthy fats.  Limit cheese to 1 oz (28 g) per day.  Eat more home-cooked food and less restaurant, buffet, and fast food.  Limit fried foods.  Cook foods using methods other than frying.  Limit canned vegetables. If you do use them, rinse them well to decrease the sodium.  When eating at a restaurant, ask that your food be  prepared with less salt, or no salt if possible. WHAT FOODS CAN I EAT? Seek help from a dietitian for individual calorie needs. Grains Whole grain or whole wheat bread. Brown rice. Whole grain or whole wheat pasta. Quinoa, bulgur, and whole grain cereals. Low-sodium cereals. Corn or whole wheat flour tortillas. Whole grain cornbread. Whole grain crackers. Low-sodium crackers. Vegetables Fresh or frozen vegetables (raw, steamed, roasted, or grilled). Low-sodium or reduced-sodium tomato and vegetable juices. Low-sodium or reduced-sodium tomato sauce and paste. Low-sodium or reduced-sodium canned vegetables.  Fruits All fresh, canned (in natural juice), or frozen fruits. Meat and Other Protein Products Ground beef (85% or leaner), grass-fed beef, or beef trimmed of fat. Skinless chicken or turkey. Ground chicken or turkey. Pork trimmed of fat. All fish and seafood. Eggs. Dried beans, peas, or lentils.   Unsalted nuts and seeds. Unsalted canned beans. Dairy Low-fat dairy products, such as skim or 1% milk, 2% or reduced-fat cheeses, low-fat ricotta or cottage cheese, or plain low-fat yogurt. Low-sodium or reduced-sodium cheeses. Fats and Oils Tub margarines without trans fats. Light or reduced-fat mayonnaise and salad dressings (reduced sodium). Avocado. Safflower, olive, or canola oils. Natural peanut or almond butter. Other Unsalted popcorn and pretzels. The items listed above may not be a complete list of recommended foods or beverages. Contact your dietitian for more options. WHAT FOODS ARE NOT RECOMMENDED? Grains White bread. White pasta. White rice. Refined cornbread. Bagels and croissants. Crackers that contain trans fat. Vegetables Creamed or fried vegetables. Vegetables in a cheese sauce. Regular canned vegetables. Regular canned tomato sauce and paste. Regular tomato and vegetable juices. Fruits Dried fruits. Canned fruit in light or heavy syrup. Fruit juice. Meat and Other Protein  Products Fatty cuts of meat. Ribs, chicken wings, bacon, sausage, bologna, salami, chitterlings, fatback, hot dogs, bratwurst, and packaged luncheon meats. Salted nuts and seeds. Canned beans with salt. Dairy Whole or 2% milk, cream, half-and-half, and cream cheese. Whole-fat or sweetened yogurt. Full-fat cheeses or blue cheese. Nondairy creamers and whipped toppings. Processed cheese, cheese spreads, or cheese curds. Condiments Onion and garlic salt, seasoned salt, table salt, and sea salt. Canned and packaged gravies. Worcestershire sauce. Tartar sauce. Barbecue sauce. Teriyaki sauce. Soy sauce, including reduced sodium. Steak sauce. Fish sauce. Oyster sauce. Cocktail sauce. Horseradish. Ketchup and mustard. Meat flavorings and tenderizers. Bouillon cubes. Hot sauce. Tabasco sauce. Marinades. Taco seasonings. Relishes. Fats and Oils Butter, stick margarine, lard, shortening, ghee, and bacon fat. Coconut, palm kernel, or palm oils. Regular salad dressings. Other Pickles and olives. Salted popcorn and pretzels. The items listed above may not be a complete list of foods and beverages to avoid. Contact your dietitian for more information. WHERE CAN I FIND MORE INFORMATION? National Heart, Lung, and Blood Institute: www.nhlbi.nih.gov/health/health-topics/topics/dash/ Document Released: 07/08/2011 Document Revised: 12/03/2013 Document Reviewed: 05/23/2013 ExitCare Patient Information 2015 [employer]. This information is not intended to replace advice given to you by your health care provider. Make sure you discuss any questions you have with your health care provider. Hypertension Hypertension, commonly called high blood pressure, is when the force of blood pumping through your arteries is too strong. Your arteries are the blood vessels that carry blood from your heart throughout your body. A blood pressure reading consists of a higher number over a lower number, such as 110/72. The higher number  (systolic) is the pressure inside your arteries when your heart pumps. The lower number (diastolic) is the pressure inside your arteries when your heart relaxes. Ideally you want your blood pressure below 120/80. Hypertension forces your heart to work harder to pump blood. Your arteries may become narrow or stiff. Having hypertension puts you at risk for heart disease, stroke, and other problems.  RISK FACTORS Some risk factors for high blood pressure are controllable. Others are not.  Risk factors you cannot control include:   Race. You may be at higher risk if you are African American.  Age. Risk increases with age.  Gender. Men are at higher risk than women before age 45 years. After age 65, women are at higher risk than men. Risk factors you can control include:  Not getting enough exercise or physical activity.  Being overweight.  Getting too much fat, sugar, calories, or salt in your diet.  Drinking too much alcohol. SIGNS AND SYMPTOMS Hypertension does not usually cause signs   or symptoms. Extremely high blood pressure (hypertensive crisis) may cause headache, anxiety, shortness of breath, and nosebleed. DIAGNOSIS  To check if you have hypertension, your health care provider will measure your blood pressure while you are seated, with your arm held at the level of your heart. It should be measured at least twice using the same arm. Certain conditions can cause a difference in blood pressure between your right and left arms. A blood pressure reading that is higher than normal on one occasion does not mean that you need treatment. If one blood pressure reading is high, ask your health care provider about having it checked again. TREATMENT  Treating high blood pressure includes making lifestyle changes and possibly taking medicine. Living a healthy lifestyle can help lower high blood pressure. You may need to change some of your habits. Lifestyle changes may include:  Following the DASH  diet. This diet is high in fruits, vegetables, and whole grains. It is low in salt, red meat, and added sugars.  Getting at least 2 hours of brisk physical activity every week.  Losing weight if necessary.  Not smoking.  Limiting alcoholic beverages.  Learning ways to reduce stress. If lifestyle changes are not enough to get your blood pressure under control, your health care provider may prescribe medicine. You may need to take more than one. Work closely with your health care provider to understand the risks and benefits. HOME CARE INSTRUCTIONS  Have your blood pressure rechecked as directed by your health care provider.   Take medicines only as directed by your health care provider. Follow the directions carefully. Blood pressure medicines must be taken as prescribed. The medicine does not work as well when you skip doses. Skipping doses also puts you at risk for problems.   Do not smoke.   Monitor your blood pressure at home as directed by your health care provider. SEEK MEDICAL CARE IF:   You think you are having a reaction to medicines taken.  You have recurrent headaches or feel dizzy.  You have swelling in your ankles.  You have trouble with your vision. SEEK IMMEDIATE MEDICAL CARE IF:  You develop a severe headache or confusion.  You have unusual weakness, numbness, or feel faint.  You have severe chest or abdominal pain.  You vomit repeatedly.  You have trouble breathing. MAKE SURE YOU:   Understand these instructions.  Will watch your condition.  Will get help right away if you are not doing well or get worse. Document Released: 07/19/2005 Document Revised: 12/03/2013 Document Reviewed: 05/11/2013 ExitCare Patient Information 2015 [employer]. This information is not intended to replace advice given to you by your health care provider. Make sure you discuss any questions you have with your health care provider.  

## 2014-08-06 ENCOUNTER — Ambulatory Visit (HOSPITAL_COMMUNITY)
Admission: RE | Admit: 2014-08-06 | Discharge: 2014-08-06 | Disposition: A | Discharge status: Home / Self Care | Payer: Self-pay | Source: Ambulatory Visit | Attending: Internal Medicine | Admitting: Internal Medicine

## 2014-08-06 DIAGNOSIS — E669 Obesity, unspecified: Secondary | ICD-10-CM | POA: Insufficient documentation

## 2014-08-06 DIAGNOSIS — R0600 Dyspnea, unspecified: Secondary | ICD-10-CM | POA: Insufficient documentation

## 2014-08-06 DIAGNOSIS — I10 Essential (primary) hypertension: Secondary | ICD-10-CM | POA: Insufficient documentation

## 2014-08-06 DIAGNOSIS — E785 Hyperlipidemia, unspecified: Secondary | ICD-10-CM | POA: Insufficient documentation

## 2014-08-06 DIAGNOSIS — I080 Rheumatic disorders of both mitral and aortic valves: Secondary | ICD-10-CM | POA: Insufficient documentation

## 2014-08-06 DIAGNOSIS — E119 Type 2 diabetes mellitus without complications: Secondary | ICD-10-CM | POA: Insufficient documentation

## 2014-08-06 DIAGNOSIS — I059 Rheumatic mitral valve disease, unspecified: Secondary | ICD-10-CM

## 2014-08-06 NOTE — Progress Notes (Signed)
  Echocardiogram 2D Echocardiogram has been performed.  Benjamin [name] 08/06/2014, 11:13 AM

## 2014-08-07 ENCOUNTER — Other Ambulatory Visit: Payer: Self-pay | Admitting: Internal Medicine

## 2014-08-07 DIAGNOSIS — I5082 Biventricular heart failure: Secondary | ICD-10-CM

## 2014-08-08 ENCOUNTER — Telehealth: Payer: Self-pay | Admitting: Emergency Medicine

## 2014-08-08 NOTE — Telephone Encounter (Signed)
-----   Message from Quentin Angst, MD sent at 07/28/2014  2:51 PM EST ----- Please informed patient that his chest x-ray shows enlarged heart, but no active disease. There is also thickening of the blood vessels around the heart. We are still waiting for the report of echocardiogram/heart ultrasound.

## 2014-08-08 NOTE — Telephone Encounter (Signed)
Left message for pt to return call for test results and to schedule Cardiology appt with Dr. Daleen Squibb

## 2014-08-10 ENCOUNTER — Emergency Department (HOSPITAL_BASED_OUTPATIENT_CLINIC_OR_DEPARTMENT_OTHER): Payer: Self-pay

## 2014-08-10 ENCOUNTER — Emergency Department (HOSPITAL_BASED_OUTPATIENT_CLINIC_OR_DEPARTMENT_OTHER)
Admission: EM | Admit: 2014-08-10 | Discharge: 2014-08-10 | Disposition: A | Discharge status: Home / Self Care | Payer: Self-pay | Attending: Emergency Medicine | Admitting: Emergency Medicine

## 2014-08-10 ENCOUNTER — Encounter (HOSPITAL_BASED_OUTPATIENT_CLINIC_OR_DEPARTMENT_OTHER): Payer: Self-pay | Admitting: Emergency Medicine

## 2014-08-10 ENCOUNTER — Ambulatory Visit (HOSPITAL_BASED_OUTPATIENT_CLINIC_OR_DEPARTMENT_OTHER)
Admission: EM | Admit: 2014-08-10 | Discharge: 2014-08-10 | Disposition: A | Discharge status: Home / Self Care | Payer: Self-pay | Source: Home / Self Care | Attending: Emergency Medicine | Admitting: Emergency Medicine

## 2014-08-10 DIAGNOSIS — K8020 Calculus of gallbladder without cholecystitis without obstruction: Secondary | ICD-10-CM | POA: Insufficient documentation

## 2014-08-10 DIAGNOSIS — I10 Essential (primary) hypertension: Secondary | ICD-10-CM | POA: Insufficient documentation

## 2014-08-10 DIAGNOSIS — E119 Type 2 diabetes mellitus without complications: Secondary | ICD-10-CM | POA: Insufficient documentation

## 2014-08-10 DIAGNOSIS — E780 Pure hypercholesterolemia: Secondary | ICD-10-CM | POA: Insufficient documentation

## 2014-08-10 DIAGNOSIS — Z79899 Other long term (current) drug therapy: Secondary | ICD-10-CM | POA: Insufficient documentation

## 2014-08-10 DIAGNOSIS — N200 Calculus of kidney: Secondary | ICD-10-CM | POA: Insufficient documentation

## 2014-08-10 DIAGNOSIS — R1011 Right upper quadrant pain: Secondary | ICD-10-CM

## 2014-08-10 DIAGNOSIS — Z7982 Long term (current) use of aspirin: Secondary | ICD-10-CM | POA: Insufficient documentation

## 2014-08-10 DIAGNOSIS — Z794 Long term (current) use of insulin: Secondary | ICD-10-CM | POA: Insufficient documentation

## 2014-08-10 LAB — URINALYSIS, ROUTINE W REFLEX MICROSCOPIC
Bilirubin Urine: NEGATIVE
GLUCOSE, UA: NEGATIVE mg/dL
Ketones, ur: NEGATIVE mg/dL
LEUKOCYTES UA: NEGATIVE
NITRITE: NEGATIVE
PROTEIN: 100 mg/dL — AB
SPECIFIC GRAVITY, URINE: 1.024 (ref 1.005–1.030)
UROBILINOGEN UA: 0.2 mg/dL (ref 0.0–1.0)
pH: 5.5 (ref 5.0–8.0)

## 2014-08-10 LAB — CBC WITH DIFFERENTIAL/PLATELET
Basophils Absolute: 0 10*3/uL (ref 0.0–0.1)
Basophils Relative: 0 % (ref 0–1)
Eosinophils Absolute: 0.3 10*3/uL (ref 0.0–0.7)
Eosinophils Relative: 3 % (ref 0–5)
HCT: 46 % (ref 39.0–52.0)
Hemoglobin: 15 g/dL (ref 13.0–17.0)
LYMPHS ABS: 1 10*3/uL (ref 0.7–4.0)
LYMPHS PCT: 10 % — AB (ref 12–46)
MCH: 24.8 pg — ABNORMAL LOW (ref 26.0–34.0)
MCHC: 32.6 g/dL (ref 30.0–36.0)
MCV: 76 fL — ABNORMAL LOW (ref 78.0–100.0)
MONOS PCT: 6 % (ref 3–12)
Monocytes Absolute: 0.6 10*3/uL (ref 0.1–1.0)
NEUTROS PCT: 80 % — AB (ref 43–77)
Neutro Abs: 7.5 10*3/uL (ref 1.7–7.7)
PLATELETS: 199 10*3/uL (ref 150–400)
RBC: 6.05 MIL/uL — ABNORMAL HIGH (ref 4.22–5.81)
RDW: 17.4 % — ABNORMAL HIGH (ref 11.5–15.5)
WBC: 9.4 10*3/uL (ref 4.0–10.5)

## 2014-08-10 LAB — BASIC METABOLIC PANEL
Anion gap: 9 (ref 5–15)
BUN: 25 mg/dL — AB (ref 6–23)
CHLORIDE: 105 meq/L (ref 96–112)
CO2: 23 mmol/L (ref 19–32)
Calcium: 9.5 mg/dL (ref 8.4–10.5)
Creatinine, Ser: 1.12 mg/dL (ref 0.50–1.35)
GFR calc Af Amer: 86 mL/min — ABNORMAL LOW (ref >90)
GFR, EST NON AFRICAN AMERICAN: 74 mL/min — AB (ref >90)
Glucose, Bld: 160 mg/dL — ABNORMAL HIGH (ref 70–99)
Potassium: 4.2 mmol/L (ref 3.5–5.1)
Sodium: 137 mmol/L (ref 135–145)

## 2014-08-10 LAB — URINE MICROSCOPIC-ADD ON

## 2014-08-10 LAB — CBG MONITORING, ED: Glucose-Capillary: 117 mg/dL — ABNORMAL HIGH (ref 70–99)

## 2014-08-10 LAB — HEPATIC FUNCTION PANEL
ALBUMIN: 4.1 g/dL (ref 3.5–5.2)
ALT: 21 U/L (ref 0–53)
AST: 24 U/L (ref 0–37)
Alkaline Phosphatase: 54 U/L (ref 39–117)
Bilirubin, Direct: 0.1 mg/dL (ref 0.0–0.3)
Indirect Bilirubin: 0.5 mg/dL (ref 0.3–0.9)
Total Bilirubin: 0.6 mg/dL (ref 0.3–1.2)
Total Protein: 7.1 g/dL (ref 6.0–8.3)

## 2014-08-10 LAB — LIPASE, BLOOD: LIPASE: 28 U/L (ref 11–59)

## 2014-08-10 MED ORDER — SODIUM CHLORIDE 0.9 % IV BOLUS (SEPSIS)
1000.0000 mL | Freq: Once | INTRAVENOUS | Status: AC
  Administered 2014-08-10: 1000 mL via INTRAVENOUS

## 2014-08-10 MED ORDER — FENTANYL CITRATE 0.05 MG/ML IJ SOLN
50.0000 ug | Freq: Once | INTRAMUSCULAR | Status: AC
  Administered 2014-08-10: 50 ug via INTRAVENOUS
  Filled 2014-08-10: qty 2

## 2014-08-10 MED ORDER — ONDANSETRON HCL 4 MG/2ML IJ SOLN
4.0000 mg | Freq: Once | INTRAMUSCULAR | Status: AC
  Administered 2014-08-10: 4 mg via INTRAVENOUS
  Filled 2014-08-10: qty 2

## 2014-08-10 MED ORDER — ONDANSETRON 4 MG PO TBDP: ORAL_TABLET | ORAL | Status: DC

## 2014-08-10 MED ORDER — OXYCODONE-ACETAMINOPHEN 5-325 MG PO TABS: 2.0000 | ORAL_TABLET | ORAL | Status: DC | PRN

## 2014-08-10 NOTE — ED Notes (Signed)
Right flank pain when he woke up this am.

## 2014-08-10 NOTE — ED Provider Notes (Signed)
CSN: [id number]     Arrival date & time 08/10/14  0617 History   First MD Initiated Contact with Patient 08/10/14 [phone]     No chief complaint on file.    (Consider location/radiation/quality/duration/timing/severity/associated sxs/prior Treatment) HPI Comments: Patient with a history of diabetes and hypertension presents with right upper back pain. He states he woke up with pain in his right upper back it's been constant since he woke up this morning. He states it's intents and constant. It's nonradiating. He has associated nausea but no vomiting. He feels dizzy and weak. He has a history of kidney stones but says this doesn't feel entirely the same. He denies any urinary symptoms. He did take a Percocet prior to arrival. He denies a fevers or chills. He denies any chest pain. He denies any shortness of breath.   Past Medical History  Diagnosis Date  . Hypertension   . Diabetes mellitus without complication   . High cholesterol   . Kidney stone    Past Surgical History  Procedure Laterality Date  . Colonoscopy N/A 07/12/2013    Procedure: COLONOSCOPY;  Surgeon: Shirley Friar, MD;  Location: [facility];  Service: Endoscopy;  Laterality: N/A;   Family History  Problem Relation Age of Onset  . Diabetes Mother   . Hypertension Mother   . Hyperlipidemia Mother   . Stroke Mother   . Diabetes Father   . Hypertension Father   . Stroke Father 33   History  Substance Use Topics  . Smoking status: Never Smoker   . Smokeless tobacco: Not on file  . Alcohol Use: No    Review of Systems  Constitutional: Negative for fever, chills, diaphoresis and fatigue.  HENT: Negative for congestion, rhinorrhea and sneezing.   Eyes: Negative.   Respiratory: Negative for cough, chest tightness and shortness of breath.   Cardiovascular: Negative for chest pain and leg swelling.  Gastrointestinal: Negative for nausea, vomiting, abdominal pain, diarrhea and blood in stool.  Genitourinary:  Negative for frequency, hematuria, flank pain and difficulty urinating.  Musculoskeletal: Positive for back pain. Negative for arthralgias.  Skin: Negative for rash.  Neurological: Negative for dizziness, speech difficulty, weakness, numbness and headaches.      Allergies  Review of patient's allergies indicates no known allergies.  Home Medications   Prior to Admission medications   Medication Sig Start Date End Date Taking? Authorizing Provider  acetaminophen-codeine (TYLENOL #3) 300-30 MG per tablet Take 1 tablet by mouth every 4 (four) hours as needed. Patient not taking: Reported on 07/09/2014 04/18/14   Quentin Angst, MD  Albuterol (PROVENTIL IN) Inhale into the lungs.    Historical Provider, MD  aspirin 81 MG tablet Take 81 mg by mouth daily.    Historical Provider, MD  atorvastatin (LIPITOR) 20 MG tablet Take 1 tablet (20 mg total) by mouth daily. 04/09/13   Shanker Levora Dredge, MD  carvedilol (COREG) 25 MG tablet Take 1 tablet (25 mg total) by mouth 2 (two) times daily with a meal. 01/08/14   Quentin Angst, MD  clindamycin (CLEOCIN) 300 MG capsule Take 1 capsule (300 mg total) by mouth 3 (three) times daily. Patient not taking: Reported on 07/09/2014 04/29/14   Quentin Angst, MD  esomeprazole (NEXIUM) 40 MG capsule Take 1 capsule (40 mg total) by mouth daily. 08/07/13   Quentin Angst, MD  gabapentin (NEURONTIN) 300 MG capsule Take 1 capsule (300 mg total) by mouth at bedtime. 04/09/13   Shanker Levora Dredge, MD  hydrochlorothiazide (HYDRODIURIL) 25 MG tablet Take 1 tablet (25 mg total) by mouth daily. 11/05/13   Quentin Angst, MD  insulin aspart (NOVOLOG) 100 UNIT/ML injection Inject 10 Units into the skin 2 (two) times daily with a meal. 11/05/13   Olugbemiga E Hyman Hopes, MD  insulin glargine (LANTUS) 100 UNIT/ML injection Inject 0.3 mLs (30 Units total) into the skin at bedtime. 11/05/13   Quentin Angst, MD  loratadine (CLARITIN) 10 MG tablet Take 1 tablet (10 mg  total) by mouth daily. 11/05/13   Quentin Angst, MD  metFORMIN (GLUCOPHAGE) 1000 MG tablet Take 1 tablet (1,000 mg total) by mouth 2 (two) times daily with a meal. 05/14/14   Olugbemiga E Hyman Hopes, MD  olmesartan (BENICAR) 40 MG tablet Take 1 tablet (40 mg total) by mouth daily. 11/05/13   Quentin Angst, MD  ondansetron (ZOFRAN ODT) 4 MG disintegrating tablet 4mg  ODT q4 hours prn nausea/vomit 08/10/14   Rolan Bucco, MD  oxyCODONE-acetaminophen (PERCOCET) 5-325 MG per tablet Take 2 tablets by mouth every 4 (four) hours as needed. 08/10/14   Rolan Bucco, MD  polyethylene glycol powder (GLYCOLAX/MIRALAX) powder Take 17 g by mouth 2 (two) times daily as needed. Patient not taking: Reported on 07/22/2014 04/09/13   Maretta Bees, MD  pregabalin (LYRICA) 50 MG capsule Take 50 mg by mouth 3 (three) times daily.    Historical Provider, MD  tadalafil (CIALIS) 20 MG tablet Take 0.5-1 tablets (10-20 mg total) by mouth every other day as needed for erectile dysfunction. Patient not taking: Reported on 07/22/2014 11/05/13   Quentin Angst, MD  tamsulosin (FLOMAX) 0.4 MG CAPS capsule Take 1 capsule (0.4 mg total) by mouth daily. Patient not taking: Reported on 07/22/2014 06/29/14   Rolan Bucco, MD   BP 103/71 mmHg  Pulse 66  Temp(Src) 98.1 F (36.7 C) (Oral)  Resp 19  Ht 5\' 8"  (1.727 m)  Wt 250 lb (113.399 kg)  BMI 38.02 kg/m2  SpO2 95% Physical Exam  Constitutional: He is oriented to person, place, and time. He appears well-developed and well-nourished.  HENT:  Head: Normocephalic and atraumatic.  Eyes: Pupils are equal, round, and reactive to light.  Neck: Normal range of motion. Neck supple.  Cardiovascular: Normal rate, regular rhythm and normal heart sounds.   Pulmonary/Chest: Effort normal and breath sounds normal. No respiratory distress. He has no wheezes. He has no rales. He exhibits no tenderness.  Abdominal: Soft. Bowel sounds are normal. There is no tenderness. There is no  rebound and no guarding.  Right CVA tenderness  Musculoskeletal: Normal range of motion. He exhibits no edema.  Lymphadenopathy:    He has no cervical adenopathy.  Neurological: He is alert and oriented to person, place, and time.  Skin: Skin is warm and dry. No rash noted.  Psychiatric: He has a normal mood and affect.    ED Course  Procedures (including critical care time) Labs Review Labs Reviewed  URINALYSIS, ROUTINE W REFLEX MICROSCOPIC - Abnormal; Notable for the following:    Hgb urine dipstick MODERATE (*)    Protein, ur 100 (*)    All other components within normal limits  BASIC METABOLIC PANEL - Abnormal; Notable for the following:    Glucose, Bld 160 (*)    BUN 25 (*)    GFR calc non Af Amer 74 (*)    GFR calc Af Amer 86 (*)    All other components within normal limits  CBC WITH DIFFERENTIAL - Abnormal; Notable  for the following:    RBC 6.05 (*)    MCV 76.0 (*)    MCH 24.8 (*)    RDW 17.4 (*)    Neutrophils Relative % 80 (*)    Lymphocytes Relative 10 (*)    All other components within normal limits  CBG MONITORING, ED - Abnormal; Notable for the following:    Glucose-Capillary 117 (*)    All other components within normal limits  URINE MICROSCOPIC-ADD ON  HEPATIC FUNCTION PANEL  LIPASE, BLOOD    Imaging Review Ct Renal Stone Study  08/10/2014   CLINICAL DATA:  Upper abdominal and back pain for 1 day.  Nausea  EXAM: CT ABDOMEN AND PELVIS WITHOUT CONTRAST  TECHNIQUE: Multidetector CT imaging of the abdomen and pelvis was performed following the standard protocol without oral or intravenous contrast material administration.  COMPARISON:  June 29, 2014  FINDINGS: Lung bases are clear. There is again noted cardiomegaly with multiple foci of coronary artery calcification. Visualized pericardium is not thickened.  No focal liver lesions are identified on this noncontrast enhanced study. There is again noted a gallstone in the neck of the gallbladder. The gallbladder  is borderline distended without wall thickening or pericholecystic fluid demonstrable. There is no appreciable biliary duct dilatation.  Spleen, pancreas, and adrenals appear normal.  There is a nonobstructing calculus in the lower pole of the right kidney measuring 8 x 8 mm. No other intrarenal calculus seen on the right. There is no renal mass or hydronephrosis on the right. There is atherosclerotic calcification scattered throughout the right renal artery. There is no right-sided ureteral calculus.  There is a 2 mm nonobstructing calculus in the lower pole left kidney. No other intrarenal calculus seen on the left. There is no left-sided renal mass or hydronephrosis. There is no appreciable ureteral calculus on the left.  In the pelvis, there is fat in each inguinal ring. The urinary bladder is largely decompressed. There is no pelvic mass or pelvic fluid collection. Appendix appears normal.  There is a small ventral hernia containing only fat.  There is no bowel obstruction. No free air or portal venous air. There is no appreciable ascites, adenopathy, or abscess in the abdomen or pelvis. There is atherosclerotic change in the aorta and iliac arteries as well as in the major mesenteric vessels proximally. No aneurysm seen. There is no evidence of intramural air to suggest bowel ischemia currently.  There is degenerative change in the lumbar spine. There are no blastic or lytic bone lesions. Pars defects at L5 are noted bilaterally, stable.  IMPRESSION: Nonobstructing calculi in each kidney. No hydronephrosis or ureteral calculus on either side.  Cholelithiasis.  Cardiomegaly with extensive coronary artery calcification. Note that there is extensive atherosclerotic change throughout the major abdominal arterial vessels.  No bowel obstruction.  No abscess.  Appendix appears normal.   Electronically Signed   By: Bretta Bang M.D.   On: 08/10/2014 07:57     EKG Interpretation None      MDM   Final  diagnoses:  Kidney stone  Cholelithiasis without cholecystitis    Patient presents with right upper back pain. His pain is controlled dose of pain medication in the ED. His blood pressure is stable. On reexam he has some mild tenderness in his right upper quadrant. It's unclear whether this pain is caused from his large renal stone or gallstone. He does have hematuria but no evidence of infection. He has a gallstone as well. No evidence of the  cystitis on CT. His lab work is normal. He's afebrile. We do not have ultrasound available this morning but I will schedule him to have a gallbladder ultrasound for later today. He was discharged home in good condition with instructions on a low-fat diet. He was given a prescription for Percocet and Zofran. Here he has an appointment to follow-up with [facility] although it's not until March 1. I encouraged him to try to schedule a sooner appointment. I also gave him referral to follow-up with surgery. Return precautions were given.    Rolan Bucco, MD 08/10/14 [phone]

## 2014-08-10 NOTE — Discharge Instructions (Signed)
Abdominal Pain  Many things can cause abdominal pain. Usually, abdominal pain is not caused by a disease and will improve without treatment. It can often be observed and treated at home. Your health care provider will do a physical exam and possibly order blood tests and X-rays to help determine the seriousness of your pain. However, in many cases, more time must pass before a clear cause of the pain can be found. Before that point, your health care provider may not know if you need more testing or further treatment.  HOME CARE INSTRUCTIONS   Monitor your abdominal pain for any changes. The following actions may help to alleviate any discomfort you are experiencing:   Only take over-the-counter or prescription medicines as directed by your health care provider.   Do not take laxatives unless directed to do so by your health care provider.   Try a clear liquid diet (broth, tea, or water) as directed by your health care provider. Slowly move to a bland diet as tolerated.  SEEK MEDICAL CARE IF:   You have unexplained abdominal pain.   You have abdominal pain associated with nausea or diarrhea.   You have pain when you urinate or have a bowel movement.   You experience abdominal pain that wakes you in the night.   You have abdominal pain that is worsened or improved by eating food.   You have abdominal pain that is worsened with eating fatty foods.   You have a fever.  SEEK IMMEDIATE MEDICAL CARE IF:    Your pain does not go away within 2 hours.   You keep throwing up (vomiting).   Your pain is felt only in portions of the abdomen, such as the right side or the left lower portion of the abdomen.   You pass bloody or black tarry stools.  MAKE SURE YOU:   Understand these instructions.    Will watch your condition.    Will get help right away if you are not doing well or get worse.   Document Released: 04/28/2005 Document Revised: 07/24/2013 Document Reviewed: 03/28/2013  [employer] Patient Information  2015 [employer]. This information is not intended to replace advice given to you by your health care provider. Make sure you discuss any questions you have with your health care provider.          Biliary Colic   Biliary colic is a steady or irregular pain in the upper abdomen. It is usually under the right side of the rib cage. It happens when gallstones interfere with the normal flow of bile from the gallbladder. Bile is a liquid that helps to digest fats. Bile is made in the liver and stored in the gallbladder. When you eat a meal, bile passes from the gallbladder through the cystic duct and the common bile duct into the small intestine. There, it mixes with partially digested food. If a gallstone blocks either of these ducts, the normal flow of bile is blocked. The muscle cells in the bile duct contract forcefully to try to move the stone. This causes the pain of biliary colic.   SYMPTOMS    A person with biliary colic usually complains of pain in the upper abdomen. This pain can be:   In the center of the upper abdomen just below the breastbone.   In the upper-right part of the abdomen, near the gallbladder and liver.   Spread back toward the right shoulder blade.   Nausea and vomiting.   The pain   usually occurs after eating.   Biliary colic is usually triggered by the digestive system's demand for bile. The demand for bile is high after fatty meals. Symptoms can also occur when a person who has been fasting suddenly eats a very large meal. Most episodes of biliary colic pass after 1 to 5 hours. After the most intense pain passes, your abdomen may continue to ache mildly for about 24 hours.  DIAGNOSIS   After you describe your symptoms, your caregiver will perform a physical exam. He or she will pay attention to the upper right portion of your belly (abdomen). This is the area of your liver and gallbladder. An ultrasound will help your caregiver look for gallstones. Specialized scans of the  gallbladder may also be done. Blood tests may be done, especially if you have fever or if your pain persists.  PREVENTION   Biliary colic can be prevented by controlling the risk factors for gallstones. Some of these risk factors, such as heredity, increasing age, and pregnancy are a normal part of life. Obesity and a high-fat diet are risk factors you can change through a healthy lifestyle. Women going through menopause who take hormone replacement therapy (estrogen) are also more likely to develop biliary colic.  TREATMENT    Pain medication may be prescribed.   You may be encouraged to eat a fat-free diet.   If the first episode of biliary colic is severe, or episodes of colic keep retuning, surgery to remove the gallbladder (cholecystectomy) is usually recommended. This procedure can be done through small incisions using an instrument called a laparoscope. The procedure often requires a brief stay in the hospital. Some people can leave the hospital the same day. It is the most widely used treatment in people troubled by painful gallstones. It is effective and safe, with no complications in more than 90% of cases.   If surgery cannot be done, medication that dissolves gallstones may be used. This medication is expensive and can take months or years to work. Only small stones will dissolve.   Rarely, medication to dissolve gallstones is combined with a procedure called shock-wave lithotripsy. This procedure uses carefully aimed shock waves to break up gallstones. In many people treated with this procedure, gallstones form again within a few years.  PROGNOSIS   If gallstones block your cystic duct or common bile duct, you are at risk for repeated episodes of biliary colic. There is also a 25% chance that you will develop a gallbladder infection(acute cholecystitis), or some other complication of gallstones within 10 to 20 years. If you have surgery, schedule it at a time that is convenient for you and at a  time when you are not sick.  HOME CARE INSTRUCTIONS    Drink plenty of clear fluids.   Avoid fatty, greasy or fried foods, or any foods that make your pain worse.   Take medications as directed.  SEEK MEDICAL CARE IF:    You develop a fever over 100.5 F (38.1 C).   Your pain gets worse over time.   You develop nausea that prevents you from eating and drinking.   You develop vomiting.  SEEK IMMEDIATE MEDICAL CARE IF:    You have continuous or severe belly (abdominal) pain which is not relieved with medications.   You develop nausea and vomiting which is not relieved with medications.   You have symptoms of biliary colic and you suddenly develop a fever and shaking chills. This may signal cholecystitis. Call your caregiver   immediately.   You develop a yellow color to your skin or the white part of your eyes (jaundice).  Document Released: 12/20/2005 Document Revised: 10/11/2011 Document Reviewed: 02/29/2008  [employer] Patient Information 2015 [employer]. This information is not intended to replace advice given to you by your health care provider. Make sure you discuss any questions you have with your health care provider.

## 2014-08-16 ENCOUNTER — Telehealth: Payer: Self-pay | Admitting: Emergency Medicine

## 2014-08-16 NOTE — Telephone Encounter (Signed)
-----   Message from Quentin Angst, MD sent at 08/07/2014  8:35 AM EST ----- Please inform patient that her heart ultrasound shows severely reduced heart function as a pattern of heart failure. We will refer patient to cardiologist, he may need further testing and possible defibrillator insertion. Advised patient to be compliant with medications to prevent further heart complications. Please make appointment for patient with Dr. Vern Claude cardiology clinic, I have placed the referral.

## 2014-08-16 NOTE — Telephone Encounter (Signed)
Pt given Echo results with scheduled f/u appointment with Dr. Daleen Squibb 08/21/14 @ 1030 am Pt instructed to bring all heart medications Also states BP at home reading has been normal reading 100/76 70-80

## 2014-08-21 ENCOUNTER — Ambulatory Visit (HOSPITAL_COMMUNITY)
Admission: RE | Admit: 2014-08-21 | Discharge: 2014-08-21 | Disposition: A | Discharge status: Home / Self Care | Payer: Self-pay | Source: Ambulatory Visit | Attending: Internal Medicine | Admitting: Internal Medicine

## 2014-08-21 ENCOUNTER — Ambulatory Visit: Payer: Self-pay | Attending: Cardiology | Admitting: Cardiology

## 2014-08-21 ENCOUNTER — Encounter: Payer: Self-pay | Admitting: Cardiology

## 2014-08-21 VITALS — BP 133/89 | HR 77 | Temp 97.7°F | Resp 20 | Ht 68.0 in | Wt 246.0 lb

## 2014-08-21 DIAGNOSIS — Z794 Long term (current) use of insulin: Secondary | ICD-10-CM | POA: Insufficient documentation

## 2014-08-21 DIAGNOSIS — Z9114 Patient's other noncompliance with medication regimen: Secondary | ICD-10-CM | POA: Insufficient documentation

## 2014-08-21 DIAGNOSIS — I110 Hypertensive heart disease with heart failure: Secondary | ICD-10-CM

## 2014-08-21 DIAGNOSIS — I517 Cardiomegaly: Secondary | ICD-10-CM | POA: Insufficient documentation

## 2014-08-21 DIAGNOSIS — E785 Hyperlipidemia, unspecified: Secondary | ICD-10-CM | POA: Insufficient documentation

## 2014-08-21 DIAGNOSIS — I509 Heart failure, unspecified: Secondary | ICD-10-CM

## 2014-08-21 DIAGNOSIS — I5022 Chronic systolic (congestive) heart failure: Secondary | ICD-10-CM

## 2014-08-21 DIAGNOSIS — I119 Hypertensive heart disease without heart failure: Secondary | ICD-10-CM | POA: Insufficient documentation

## 2014-08-21 DIAGNOSIS — E663 Overweight: Secondary | ICD-10-CM | POA: Insufficient documentation

## 2014-08-21 DIAGNOSIS — G629 Polyneuropathy, unspecified: Secondary | ICD-10-CM | POA: Insufficient documentation

## 2014-08-21 DIAGNOSIS — I10 Essential (primary) hypertension: Secondary | ICD-10-CM

## 2014-08-21 DIAGNOSIS — E1151 Type 2 diabetes mellitus with diabetic peripheral angiopathy without gangrene: Secondary | ICD-10-CM | POA: Insufficient documentation

## 2014-08-21 MED ORDER — SPIRONOLACTONE 25 MG PO TABS: 25.0000 mg | ORAL_TABLET | Freq: Every day | ORAL | Status: DC

## 2014-08-21 MED ORDER — CARVEDILOL 25 MG PO TABS: 25.0000 mg | ORAL_TABLET | Freq: Two times a day (BID) | ORAL | Status: DC

## 2014-08-21 NOTE — Assessment & Plan Note (Signed)
Long-standing hypertension is the cause of his congestive heart failure. Please see under congestive heart failure treatment strategy. He understands clearly that this isn't absolute if he is going to continue to be relatively asymptomatic and do well as possible.

## 2014-08-21 NOTE — Assessment & Plan Note (Signed)
He has biventricular enlargement and chronic systolic heart failure with an ejection fraction of 25% and mild LVH from untreated hypertension for almost 30 years. He most likely has diabetic cardiomyopathy as well and may indeed have some coronary disease which is currently asymptomatic. I have had a long talk with him today about this and answered all his questions. I have strongly encouraged him to be compliant with his medical regimen and explained how this will reduce his risk of progressive symptoms of congestive heart failure not to mention longevity. I've added spironolactone 25 mg every morning. He will continue with his ARB, carvedilol, and hydrochlorothiazide. He's been encouraged to continue his atorvastatin. I've advised him to exercise on a treadmill at 3.4 miles per hour but to stop if he gets short of breath. I've also advised him to lose substantial weight to reduce her heart from obesity.  I will see him back in a couple weeks. We'll check a metabolic profile to follow-up on his potassium and renal function next week. I'll also check an EKG today which has not been done.  He shows total medical compliance, we'll repeat his echocardiogram in 6 months to a year. I would not advise any invasive procedures including cardiac catheterization since he is asymptomatic and would not recommend a defibrillator at this point until he shows he will take his health seriously and to be accountable for taking his medications.

## 2014-08-21 NOTE — Progress Notes (Signed)
[name] is a 54 year old male referred by Dr.Jegede for the evaluation and treatment of congestive heart failure.  He was told in the past he had an enlarged heart and was advised to see a cardiologist but did not because he did not feel bad. He has a long-standing history of hypertension, insulin-dependent diabetes, and hyperlipidemia. He has been noncompliant with his hypertensive meds in the past. His last hemoglobin A1c was 7.4.  His workup to this point include a chest x-ray which shows chronic cardiomegaly, echocardiogram showing biventricular enlargement with a left ventricular ejection fraction of 25%. He has global hypokinesia with mild LVH. In addition he has severe right and left atrial enlargement. He does not have tricuspid regurgitation and no echocardiographic signs of pulmonary hypertension. He has mild mitral regurgitation and mild aortic insufficiency. He has not an electrocardiogram.  He was diagnosed with hypertension when he was 54 years old. He admits to not taking the physician seriously at the time and did not treat it. He has not been compliant with his blood pressure medicines even up to recently.  He does have dyspnea on exertion if he walks on his treadmill faster than 3.4 miles per hour. He denies any true orthopnea and has no peripheral edema. He denies any palpitations, presyncope or syncope.  He does have insulin-dependent diabetes mellitus with peripheral neuropathy and peripheral vascular disease. There is no history of stroke or coronary artery disease. He is having no claudication and no angina.  His exam today shows him to be an overweight male in no acute distress. There is no JVD and no thyromegaly. Carotid upstrokes are equal bilaterally without bruits. Heart exam reveals a poorly appreciated PMI. He has a normal S1 and split S2. No gallop is heard. Lungs are clear to auscultation percussion. Abdominal exam is soft with good bowel sounds. His extremities  reveal no edema. Pulses are barely palpable. Capillary refill is reduced. Ulcers are healed.

## 2014-08-21 NOTE — Patient Instructions (Signed)
Please begin taking spironolactone 25 mg each morning. Prescription was sent to [facility] and [facility]. Please return to clinic for labwork on Monday 08/26/14. Please return to see Dr. Daleen Squibb in 2 weeks.

## 2014-08-21 NOTE — Progress Notes (Signed)
Patient referred by Dr. Hyman Hopes for evaluation of congestive heart failure Patient denies chest pain, swelling, headaches, dizziness.  Indicates he has intermittent shortness of breath Patient indicates he has been exercising 3x/week and trying to eat a healthy diet

## 2014-08-26 ENCOUNTER — Other Ambulatory Visit: Payer: Self-pay

## 2014-08-27 ENCOUNTER — Ambulatory Visit: Payer: Self-pay | Attending: Cardiology

## 2014-08-27 DIAGNOSIS — I5022 Chronic systolic (congestive) heart failure: Secondary | ICD-10-CM

## 2014-08-27 LAB — BASIC METABOLIC PANEL
BUN: 29 mg/dL — ABNORMAL HIGH (ref 6–23)
CO2: 25 meq/L (ref 19–32)
CREATININE: 0.98 mg/dL (ref 0.50–1.35)
Calcium: 9.6 mg/dL (ref 8.4–10.5)
Chloride: 103 mEq/L (ref 96–112)
GLUCOSE: 164 mg/dL — AB (ref 70–99)
Potassium: 5.4 mEq/L — ABNORMAL HIGH (ref 3.5–5.3)
Sodium: 139 mEq/L (ref 135–145)

## 2014-08-28 ENCOUNTER — Telehealth: Payer: Self-pay

## 2014-08-28 NOTE — Telephone Encounter (Signed)
Dr. Daleen Squibb gave verbal order that if K+ elevated, spironolactone will need to be stopped.  K+ 5.4 on 08/27/14, K+ 4.2 on 08/10/14.  Patient called and instructed to stop spironolactone due to elevated potassium.  Patient verbalized understanding.

## 2014-09-04 ENCOUNTER — Ambulatory Visit: Payer: Self-pay | Admitting: Cardiology

## 2014-09-04 ENCOUNTER — Ambulatory Visit: Payer: Self-pay | Attending: Internal Medicine

## 2014-09-04 ENCOUNTER — Telehealth: Payer: Self-pay

## 2014-09-04 DIAGNOSIS — E875 Hyperkalemia: Secondary | ICD-10-CM

## 2014-09-04 NOTE — Telephone Encounter (Signed)
-----   Message from Gaylord Shih, MD sent at 09/04/2014  8:55 AM EST ----- Make sure he is not taking potassium. Repeat BMET tomorrow.

## 2014-09-04 NOTE — Telephone Encounter (Signed)
OK. Check BMET this week.

## 2014-09-04 NOTE — Telephone Encounter (Signed)
Patient called and instructed that an additional BMET needed. Patient indicates he is able to come today for labwork. Order entered. Patient indicates he stopped taking spironolactone last week and is not taking a Potassium supplement.  Dr. Daleen Squibb indicates patient should follow-up with him in 6 months. Patient informed and verbalized understanding.

## 2014-09-05 LAB — BASIC METABOLIC PANEL
BUN: 26 mg/dL — AB (ref 6–23)
CALCIUM: 9.3 mg/dL (ref 8.4–10.5)
CHLORIDE: 100 meq/L (ref 96–112)
CO2: 25 mEq/L (ref 19–32)
Creat: 0.92 mg/dL (ref 0.50–1.35)
Glucose, Bld: 276 mg/dL — ABNORMAL HIGH (ref 70–99)
POTASSIUM: 4.8 meq/L (ref 3.5–5.3)
Sodium: 136 mEq/L (ref 135–145)

## 2014-09-06 ENCOUNTER — Telehealth: Payer: Self-pay | Admitting: Internal Medicine

## 2014-09-06 ENCOUNTER — Institutional Professional Consult (permissible substitution): Payer: Self-pay | Admitting: Internal Medicine

## 2014-09-06 NOTE — Telephone Encounter (Signed)
Jasmine December from cardiology at [facility] called stating that the referral for this patient was put in to see a cardiologist at their facility but when the referral was put in it they located it to [facility] and noted that patient is to see Dr. Daleen Squibb. Jasmine December also mentioned that where it says "referred dept to" it says [facility] but if we put dr walls name they would have seen this and they would have not scheduled the patient. The patient went to his appointment today and they had to send him back home. They will close referral and they wanted to let us know about this mix up so that we can avoid this in the future. Please follow up with Jasmine December if you need more details.

## 2014-09-06 NOTE — Telephone Encounter (Signed)
Spoke with Benjamin [name] from [facility], pt was seen already by Dr. Daleen Squibb last week for heart care. States, if patients are to f/u with Dr. Daleen Squibb, please don't a place referral. Pt went to scheduled appt today and was sent home.

## 2014-09-11 ENCOUNTER — Telehealth: Payer: Self-pay

## 2014-09-11 NOTE — Telephone Encounter (Signed)
Spoke with Dr. Daleen Squibb. Patient's potassium level now within normal limits. Patient is to remain off of spironolactone.  No changes to medication regimen. Patient updated and verbalized understanding.  Patient aware Dr. Daleen Squibb would like him to follow-up in 6 months or earlier if he has any problems.  Patient verbalized understanding.

## 2014-10-01 ENCOUNTER — Ambulatory Visit: Payer: No Typology Code available for payment source | Admitting: Urology

## 2014-10-14 ENCOUNTER — Other Ambulatory Visit: Payer: Self-pay | Admitting: Internal Medicine

## 2014-10-15 NOTE — Progress Notes (Signed)
Erroneous Encounter, please disregard

## 2014-10-17 ENCOUNTER — Other Ambulatory Visit: Payer: Self-pay | Admitting: Internal Medicine

## 2014-10-17 ENCOUNTER — Other Ambulatory Visit: Payer: Self-pay | Admitting: Emergency Medicine

## 2014-10-17 MED ORDER — CARVEDILOL 25 MG PO TABS: 25.0000 mg | ORAL_TABLET | Freq: Two times a day (BID) | ORAL | Status: DC

## 2014-10-18 ENCOUNTER — Other Ambulatory Visit: Payer: Self-pay

## 2014-10-18 DIAGNOSIS — E1110 Type 2 diabetes mellitus with ketoacidosis without coma: Secondary | ICD-10-CM

## 2014-10-18 DIAGNOSIS — E118 Type 2 diabetes mellitus with unspecified complications: Secondary | ICD-10-CM

## 2014-10-18 MED ORDER — INSULIN GLARGINE 100 UNIT/ML ~~LOC~~ SOLN: 30.0000 [IU] | Freq: Every day | SUBCUTANEOUS | Status: DC

## 2014-10-18 MED ORDER — INSULIN ASPART 100 UNIT/ML ~~LOC~~ SOLN: 10.0000 [IU] | Freq: Two times a day (BID) | SUBCUTANEOUS | Status: DC

## 2014-11-13 ENCOUNTER — Ambulatory Visit: Payer: No Typology Code available for payment source | Attending: Internal Medicine

## 2014-12-24 ENCOUNTER — Telehealth: Payer: Self-pay | Admitting: Internal Medicine

## 2014-12-24 DIAGNOSIS — I10 Essential (primary) hypertension: Secondary | ICD-10-CM

## 2014-12-24 NOTE — Telephone Encounter (Signed)
Patient has come in today to request a medication refill for olmesartan (BENICAR) 40 MG tablet; patient states he would like medication filled through the [facility]; please f/u with patient about this request;

## 2015-01-07 MED ORDER — OLMESARTAN MEDOXOMIL 40 MG PO TABS: 40.0000 mg | ORAL_TABLET | Freq: Every day | ORAL | Status: DC

## 2015-01-07 NOTE — Telephone Encounter (Signed)
Sent refill to [facility]

## 2015-01-27 ENCOUNTER — Other Ambulatory Visit: Payer: Self-pay

## 2015-03-27 ENCOUNTER — Encounter: Payer: Self-pay | Admitting: Internal Medicine

## 2015-03-27 ENCOUNTER — Ambulatory Visit: Payer: Self-pay | Attending: Internal Medicine | Admitting: Internal Medicine

## 2015-03-27 VITALS — BP 102/70 | HR 66 | Temp 98.6°F | Resp 18 | Wt 248.8 lb

## 2015-03-27 DIAGNOSIS — E1110 Type 2 diabetes mellitus with ketoacidosis without coma: Secondary | ICD-10-CM

## 2015-03-27 DIAGNOSIS — E118 Type 2 diabetes mellitus with unspecified complications: Secondary | ICD-10-CM

## 2015-03-27 DIAGNOSIS — I10 Essential (primary) hypertension: Secondary | ICD-10-CM

## 2015-03-27 DIAGNOSIS — J302 Other seasonal allergic rhinitis: Secondary | ICD-10-CM

## 2015-03-27 DIAGNOSIS — K2100 Gastro-esophageal reflux disease with esophagitis, without bleeding: Secondary | ICD-10-CM

## 2015-03-27 DIAGNOSIS — E1310 Other specified diabetes mellitus with ketoacidosis without coma: Secondary | ICD-10-CM

## 2015-03-27 DIAGNOSIS — E11621 Type 2 diabetes mellitus with foot ulcer: Secondary | ICD-10-CM

## 2015-03-27 LAB — LIPID PANEL
CHOL/HDL RATIO: 4.8 ratio (ref <=5.0)
CHOLESTEROL: 172 mg/dL (ref 125–200)
HDL: 36 mg/dL — ABNORMAL LOW (ref >=40)
LDL CALC: 120 mg/dL (ref <130)
Triglycerides: 81 mg/dL (ref <150)
VLDL: 16 mg/dL (ref <30)

## 2015-03-27 LAB — BASIC METABOLIC PANEL
BUN: 20 mg/dL (ref 7–25)
CHLORIDE: 101 mmol/L (ref 98–110)
CO2: 27 mmol/L (ref 20–31)
Calcium: 9.4 mg/dL (ref 8.6–10.3)
Creat: 1.11 mg/dL (ref 0.70–1.33)
GLUCOSE: 133 mg/dL — AB (ref 65–99)
POTASSIUM: 4.9 mmol/L (ref 3.5–5.3)
SODIUM: 138 mmol/L (ref 135–146)

## 2015-03-27 LAB — GLUCOSE, POCT (MANUAL RESULT ENTRY): POC GLUCOSE: 146 mg/dL — AB (ref 70–99)

## 2015-03-27 LAB — POCT GLYCOSYLATED HEMOGLOBIN (HGB A1C): HEMOGLOBIN A1C: 8

## 2015-03-27 MED ORDER — OLMESARTAN MEDOXOMIL 40 MG PO TABS: 40.0000 mg | ORAL_TABLET | Freq: Every day | ORAL | Status: DC

## 2015-03-27 MED ORDER — GABAPENTIN 300 MG PO CAPS: 300.0000 mg | ORAL_CAPSULE | Freq: Every day | ORAL | Status: DC

## 2015-03-27 MED ORDER — CARVEDILOL 25 MG PO TABS: 25.0000 mg | ORAL_TABLET | Freq: Two times a day (BID) | ORAL | Status: DC

## 2015-03-27 MED ORDER — INSULIN ASPART 100 UNIT/ML ~~LOC~~ SOLN: 10.0000 [IU] | Freq: Two times a day (BID) | SUBCUTANEOUS | Status: DC

## 2015-03-27 MED ORDER — INSULIN GLARGINE 100 UNIT/ML ~~LOC~~ SOLN: 30.0000 [IU] | Freq: Every day | SUBCUTANEOUS | Status: DC

## 2015-03-27 MED ORDER — ATORVASTATIN CALCIUM 20 MG PO TABS: 20.0000 mg | ORAL_TABLET | Freq: Every day | ORAL | Status: DC

## 2015-03-27 MED ORDER — LORATADINE 10 MG PO TABS: 10.0000 mg | ORAL_TABLET | Freq: Every day | ORAL | Status: DC

## 2015-03-27 MED ORDER — METFORMIN HCL 1000 MG PO TABS: 1000.0000 mg | ORAL_TABLET | Freq: Two times a day (BID) | ORAL | Status: DC

## 2015-03-27 MED ORDER — ACETAMINOPHEN-CODEINE #3 300-30 MG PO TABS: 1.0000 | ORAL_TABLET | ORAL | Status: DC | PRN

## 2015-03-27 MED ORDER — HYDROCHLOROTHIAZIDE 25 MG PO TABS: 25.0000 mg | ORAL_TABLET | Freq: Every day | ORAL | Status: DC

## 2015-03-27 MED ORDER — ESOMEPRAZOLE MAGNESIUM 40 MG PO CPDR: 40.0000 mg | DELAYED_RELEASE_CAPSULE | Freq: Every day | ORAL | Status: DC

## 2015-03-27 NOTE — Progress Notes (Signed)
Patient ID: Benjamin [name], male   DOB: 03/02/1961, 54 y.o.   MRN: [id number]   Benjamin [name], is a 54 y.o. male  [id number]  [id number]  DOB - [birth year]  Chief Complaint  Patient presents with  . Follow-up        Subjective:   Benjamin [name] is a 54 y.o. male here today for a follow up visit. Patient has history of diabetes mellitus, hypertension, dyslipidemia, recently diagnosed with congestive heart failure with ejection fraction of 25% and biventricular enlargement in January 2016. Major risk factors include diabetes with possible diabetic cardiomyopathy as well as long-standing hypertension with poor compliance with medications and dietary regimen. Patient saw the cardiologist and the plan was to maximize medical treatment, spironolactone 25 mg every morning was added, patient to continue ARB, carvedilol and hydrochlorothiazide. He is here today for routine follow-up, he has no new complaints, he endorses improvement in his exercise tolerance although still short of breath after 3-4 blocks of brisk walking. He has no cough, he denies orthopnea, minimal paroxysmal nocturnal dyspnea. Leg swelling has resolved. Patient traveled home recently and was out of all his medications, including his insulin. He has not been totally compliant despite all counseling and education. Patient has appointment coming up with cardiologist. Patient states he is applying for disability because of exercise and activity intolerance. Patient has No headache, No chest pain, No abdominal pain - No Nausea, No new weakness tingling or numbness, No Cough - SOB.  No problems updated.  ALLERGIES: No Known Allergies  PAST MEDICAL HISTORY: Past Medical History  Diagnosis Date  . Hypertension   . Diabetes mellitus without complication   . High cholesterol   . Kidney stone     MEDICATIONS AT HOME: Prior to Admission medications   Medication Sig Start Date End Date Taking? Authorizing Provider  acetaminophen-codeine  (TYLENOL #3) 300-30 MG per tablet Take 1 tablet by mouth every 4 (four) hours as needed. 03/27/15  Yes Quentin Angst, MD  Albuterol (PROVENTIL IN) Inhale 2 puffs into the lungs.   Yes Historical Provider, MD  aspirin 81 MG tablet Take 81 mg by mouth daily.   Yes Historical Provider, MD  atorvastatin (LIPITOR) 20 MG tablet Take 1 tablet (20 mg total) by mouth daily. 03/27/15  Yes Quentin Angst, MD  carvedilol (COREG) 25 MG tablet Take 1 tablet (25 mg total) by mouth 2 (two) times daily with a meal. 03/27/15  Yes Quentin Angst, MD  esomeprazole (NEXIUM) 40 MG capsule Take 1 capsule (40 mg total) by mouth daily. 03/27/15  Yes Quentin Angst, MD  gabapentin (NEURONTIN) 300 MG capsule Take 1 capsule (300 mg total) by mouth at bedtime. 03/27/15  Yes Quentin Angst, MD  hydrochlorothiazide (HYDRODIURIL) 25 MG tablet Take 1 tablet (25 mg total) by mouth daily. 03/27/15  Yes Quentin Angst, MD  insulin aspart (NOVOLOG) 100 UNIT/ML injection Inject 10 Units into the skin 2 (two) times daily with a meal. 03/27/15  Yes Kaiea Esselman E Hyman Hopes, MD  insulin glargine (LANTUS) 100 UNIT/ML injection Inject 0.3 mLs (30 Units total) into the skin at bedtime. 03/27/15  Yes Quentin Angst, MD  loratadine (CLARITIN) 10 MG tablet Take 1 tablet (10 mg total) by mouth daily. 03/27/15  Yes Quentin Angst, MD  metFORMIN (GLUCOPHAGE) 1000 MG tablet Take 1 tablet (1,000 mg total) by mouth 2 (two) times daily with a meal. 03/27/15  Yes Quentin Angst, MD  olmesartan (BENICAR) 40 MG tablet Take  1 tablet (40 mg total) by mouth daily. 03/27/15  Yes Quentin Angst, MD  pregabalin (LYRICA) 50 MG capsule Take 50 mg by mouth 3 (three) times daily.   Yes Historical Provider, MD  tadalafil (CIALIS) 20 MG tablet Take 0.5-1 tablets (10-20 mg total) by mouth every other day as needed for erectile dysfunction. 11/05/13  Yes Quentin Angst, MD  tamsulosin (FLOMAX) 0.4 MG CAPS capsule Take 1 capsule (0.4  mg total) by mouth daily. 06/29/14  Yes Rolan Bucco, MD  ondansetron (ZOFRAN ODT) 4 MG disintegrating tablet 4mg  ODT q4 hours prn nausea/vomit Patient not taking: Reported on 03/27/2015 08/10/14   Rolan Bucco, MD  oxyCODONE-acetaminophen (PERCOCET) 5-325 MG per tablet Take 2 tablets by mouth every 4 (four) hours as needed. Patient not taking: Reported on 03/27/2015 08/10/14   Rolan Bucco, MD  polyethylene glycol powder (GLYCOLAX/MIRALAX) powder Take 17 g by mouth 2 (two) times daily as needed. Patient not taking: Reported on 03/27/2015 04/09/13   Maretta Bees, MD     Objective:   Filed Vitals:   03/27/15 1204  BP: 102/70  Pulse: 66  Temp: 98.6 F (37 C)  TempSrc: Oral  Resp: 18  Weight: 248 lb 12.8 oz (112.855 kg)  SpO2: 97%    Exam General appearance : Awake, alert, not in any distress. Speech Clear. Not toxic looking, obese HEENT: Atraumatic and Normocephalic, pupils equally reactive to light and accomodation Neck: supple, no JVD. No cervical lymphadenopathy.  Chest:Good air entry bilaterally, no added sounds  CVS: S1 S2 regular, no murmurs.  Abdomen: Bowel sounds present, Non tender and not distended with no gaurding, rigidity or rebound. Extremities: B/L Lower Ext shows no edema, both legs are warm to touch Neurology: Awake alert, and oriented X 3, CN II-XII intact, Non focal Skin:No Rash  Data Review Lab Results  Component Value Date   HGBA1C 8.0 03/27/2015   HGBA1C 7.4 07/22/2014   HGBA1C 7.5 04/18/2014     Assessment & Plan   1. DM (diabetes mellitus) type 2, uncontrolled, with ketoacidosis  - Glucose (CBG) - POCT A1C has gone up to 8% from 7.4%, patient has not been compliant with insulin regimen Patient has been advised to need to be compliant, dietary advise regular physical exercise as tolerated.  - insulin aspart (NOVOLOG) 100 UNIT/ML injection; Inject 10 Units into the skin 2 (two) times daily with a meal.  Dispense: 3 vial; Refill: 3 - insulin  glargine (LANTUS) 100 UNIT/ML injection; Inject 0.3 mLs (30 Units total) into the skin at bedtime.  Dispense: 3 vial; Refill: 3   Aim for 30 minutes of exercise most days. Rethink what you drink. Water is great! Aim for 2-3 Carb Choices per meal (30-45 grams) +/- 1 either way  Aim for 0-15 Carbs per snack if hungry  Include protein in moderation with your meals and snacks  Consider reading food labels for Total Carbohydrate and Fat Grams of foods  Consider checking BG at alternate times per day  Continue taking medication as directed Be mindful about how much sugar you are adding to beverages and other foods. Fruit Punch - find one with no sugar  Measure and decrease portions of carbohydrate foods  Make your plate and don't go back for seconds   2. Diabetic foot: Improved  - acetaminophen-codeine (TYLENOL #3) 300-30 MG per tablet; Take 1 tablet by mouth every 4 (four) hours as needed.  Dispense: 60 tablet; Refill: 0  3. Gastroesophageal reflux disease with esophagitis  Refill - esomeprazole (NEXIUM) 40 MG capsule; Take 1 capsule (40 mg total) by mouth daily.  Dispense: 90 capsule; Refill: 3  4. Essential hypertension: At goal  - hydrochlorothiazide (HYDRODIURIL) 25 MG tablet; Take 1 tablet (25 mg total) by mouth daily.  Dispense: 90 tablet; Refill: 3 - olmesartan (BENICAR) 40 MG tablet; Take 1 tablet (40 mg total) by mouth daily.  Dispense: 90 tablet; Refill: 3 - Basic Metabolic Panel - Lipid panel  - Echocardiogram: Repeat to reevaluate systolic function and wall motion after 7 months of medical management.  5. Seasonal allergies  Refill - loratadine (CLARITIN) 10 MG tablet; Take 1 tablet (10 mg total) by mouth daily.  Dispense: 30 tablet; Refill: 3  Patient have been counseled extensively about nutrition and exercise  Return in about 3 months (around 06/27/2015) for Follow up Pain and comorbidities, Heart Failure and Hypertension.  The patient was given clear  instructions to go to ER or return to medical center if symptoms don't improve, worsen or new problems develop. The patient verbalized understanding. The patient was told to call to get lab results if they haven't heard anything in the next week.   This note has been created with Education officer, environmental. Any transcriptional errors are unintentional.    Jeanann Lewandowsky, MD, MHA, FACP, FAAP, CPE [facility] and [facility], [place] [phone]   03/27/2015, 12:30 PM

## 2015-03-27 NOTE — Progress Notes (Signed)
Here for 6 month follow up Has appt next week with dr. Daleen Squibb States he is applying for disability  [facility]

## 2015-03-27 NOTE — Patient Instructions (Signed)
Diabetes and Exercise Exercising regularly is important. It is not just about losing weight. It has many health benefits, such as:  Improving your overall fitness, flexibility, and endurance.  Increasing your bone density.  Helping with weight control.  Decreasing your body fat.  Increasing your muscle strength.  Reducing stress and tension.  Improving your overall health. People with diabetes who exercise gain additional benefits because exercise:  Reduces appetite.  Improves the body's use of blood sugar (glucose).  Helps lower or control blood glucose.  Decreases blood pressure.  Helps control blood lipids (such as cholesterol and triglycerides).  Improves the body's use of the hormone insulin by:  Increasing the body's insulin sensitivity.  Reducing the body's insulin needs.  Decreases the risk for heart disease because exercising:  Lowers cholesterol and triglycerides levels.  Increases the levels of good cholesterol (such as high-density lipoproteins [HDL]) in the body.  Lowers blood glucose levels. YOUR ACTIVITY PLAN  Choose an activity that you enjoy and set realistic goals. Your health care provider or diabetes educator can help you make an activity plan that works for you. Exercise regularly as directed by your health care provider. This includes:  Performing resistance training twice a week such as push-ups, sit-ups, lifting weights, or using resistance bands.  Performing 150 minutes of cardio exercises each week such as walking, running, or playing sports.  Staying active and spending no more than 90 minutes at one time being inactive. Even short bursts of exercise are good for you. Three 10-minute sessions spread throughout the day are just as beneficial as a single 30-minute session. Some exercise ideas include:  Taking the dog for a walk.  Taking the stairs instead of the elevator.  Dancing to your favorite song.  Doing an exercise  video.  Doing your favorite exercise with a friend. RECOMMENDATIONS FOR EXERCISING WITH TYPE 1 OR TYPE 2 DIABETES   Check your blood glucose before exercising. If blood glucose levels are greater than 240 mg/dL, check for urine ketones. Do not exercise if ketones are present.  Avoid injecting insulin into areas of the body that are going to be exercised. For example, avoid injecting insulin into:  The arms when playing tennis.  The legs when jogging.  Keep a record of:  Food intake before and after you exercise.  Expected peak times of insulin action.  Blood glucose levels before and after you exercise.  The type and amount of exercise you have done.  Review your records with your health care provider. Your health care provider will help you to develop guidelines for adjusting food intake and insulin amounts before and after exercising.  If you take insulin or oral hypoglycemic agents, watch for signs and symptoms of hypoglycemia. They include:  Dizziness.  Shaking.  Sweating.  Chills.  Confusion.  Drink plenty of water while you exercise to prevent dehydration or heat stroke. Body water is lost during exercise and must be replaced.  Talk to your health care provider before starting an exercise program to make sure it is safe for you. Remember, almost any type of activity is better than none. Document Released: 10/09/2003 Document Revised: 12/03/2013 Document Reviewed: 12/26/2012 ExitCare Patient Information 2015 [employer]. This information is not intended to replace advice given to you by your health care provider. Make sure you discuss any questions you have with your health care provider. Basic Carbohydrate Counting for Diabetes Mellitus Carbohydrate counting is a method for keeping track of the amount of carbohydrates you eat.   Eating carbohydrates naturally increases the level of sugar (glucose) in your blood, so it is important for you to know the amount that is  okay for you to have in every meal. Carbohydrate counting helps keep the level of glucose in your blood within normal limits. The amount of carbohydrates allowed is different for every person. A dietitian can help you calculate the amount that is right for you. Once you know the amount of carbohydrates you can have, you can count the carbohydrates in the foods you want to eat. Carbohydrates are found in the following foods:  Grains, such as breads and cereals.  Dried beans and soy products.  Starchy vegetables, such as potatoes, peas, and corn.  Fruit and fruit juices.  Milk and yogurt.  Sweets and snack foods, such as cake, cookies, candy, chips, soft drinks, and fruit drinks. CARBOHYDRATE COUNTING There are two ways to count the carbohydrates in your food. You can use either of the methods or a combination of both. Reading the "Nutrition Facts" on Packaged Food The "Nutrition Facts" is an area that is included on the labels of almost all packaged food and beverages in the [place]. It includes the serving size of that food or beverage and information about the nutrients in each serving of the food, including the grams (g) of carbohydrate per serving.  Decide the number of servings of this food or beverage that you will be able to eat or drink. Multiply that number of servings by the number of grams of carbohydrate that is listed on the label for that serving. The total will be the amount of carbohydrates you will be having when you eat or drink this food or beverage. Learning Standard Serving Sizes of Food When you eat food that is not packaged or does not include "Nutrition Facts" on the label, you need to measure the servings in order to count the amount of carbohydrates.A serving of most carbohydrate-rich foods contains about 15 g of carbohydrates. The following list includes serving sizes of carbohydrate-rich foods that provide 15 g ofcarbohydrate per serving:   1 slice of bread  (1 oz) or 1 six-inch tortilla.    of a hamburger bun or English muffin.  4-6 crackers.   cup unsweetened dry cereal.    cup hot cereal.   cup rice or pasta.    cup mashed potatoes or  of a large baked potato.  1 cup fresh fruit or one small piece of fruit.    cup canned or frozen fruit or fruit juice.  1 cup milk.   cup plain fat-free yogurt or yogurt sweetened with artificial sweeteners.   cup cooked dried beans or starchy vegetable, such as peas, corn, or potatoes.  Decide the number of standard-size servings that you will eat. Multiply that number of servings by 15 (the grams of carbohydrates in that serving). For example, if you eat 2 cups of strawberries, you will have eaten 2 servings and 30 g of carbohydrates (2 servings x 15 g = 30 g). For foods such as soups and casseroles, in which more than one food is mixed in, you will need to count the carbohydrates in each food that is included. EXAMPLE OF CARBOHYDRATE COUNTING Sample Dinner  3 oz chicken breast.   cup of brown rice.   cup of corn.  1 cup milk.   1 cup strawberries with sugar-free whipped topping.  Carbohydrate Calculation Step 1: Identify the foods that contain carbohydrates:   Rice.   Corn.     Milk.   Strawberries. Step 2:Calculate the number of servings eaten of each:   2 servings of rice.   1 serving of corn.   1 serving of milk.   1 serving of strawberries. Step 3: Multiply each of those number of servings by 15 g:   2 servings of rice x 15 g = 30 g.   1 serving of corn x 15 g = 15 g.   1 serving of milk x 15 g = 15 g.   1 serving of strawberries x 15 g = 15 g. Step 4: Add together all of the amounts to find the total grams of carbohydrates eaten: 30 g + 15 g + 15 g + 15 g = 75 g. Document Released: 07/19/2005 Document Revised: 12/03/2013 Document Reviewed: 06/15/2013 ExitCare Patient Information 2015 [employer]. This information is not intended to  replace advice given to you by your health care provider. Make sure you discuss any questions you have with your health care provider.  

## 2015-03-28 ENCOUNTER — Ambulatory Visit (HOSPITAL_COMMUNITY)
Admission: RE | Admit: 2015-03-28 | Discharge: 2015-03-28 | Disposition: A | Discharge status: Home / Self Care | Payer: Self-pay | Source: Ambulatory Visit | Attending: Internal Medicine | Admitting: Internal Medicine

## 2015-03-28 DIAGNOSIS — I509 Heart failure, unspecified: Secondary | ICD-10-CM | POA: Insufficient documentation

## 2015-03-28 DIAGNOSIS — E785 Hyperlipidemia, unspecified: Secondary | ICD-10-CM | POA: Insufficient documentation

## 2015-03-28 DIAGNOSIS — I340 Nonrheumatic mitral (valve) insufficiency: Secondary | ICD-10-CM | POA: Insufficient documentation

## 2015-03-28 DIAGNOSIS — I517 Cardiomegaly: Secondary | ICD-10-CM | POA: Insufficient documentation

## 2015-03-28 DIAGNOSIS — E119 Type 2 diabetes mellitus without complications: Secondary | ICD-10-CM | POA: Insufficient documentation

## 2015-03-28 DIAGNOSIS — I10 Essential (primary) hypertension: Secondary | ICD-10-CM | POA: Insufficient documentation

## 2015-03-28 NOTE — Progress Notes (Signed)
  Echocardiogram 2D Echocardiogram has been performed.  Benjamin [name] 03/28/2015, 2:01 PM

## 2015-03-31 ENCOUNTER — Telehealth: Payer: Self-pay | Admitting: *Deleted

## 2015-03-31 NOTE — Telephone Encounter (Signed)
-----   Message from Quentin Angst, MD sent at 03/28/2015  6:04 PM EDT ----- Please inform patient that his laboratory test results are mostly within normal limits. Continue blood pressure and blood sugar control as previously discussed.

## 2015-04-01 ENCOUNTER — Telehealth: Payer: Self-pay | Admitting: *Deleted

## 2015-04-01 NOTE — Telephone Encounter (Signed)
-----   Message from Quentin Angst, MD sent at 03/28/2015  6:03 PM EDT ----- Please inform patient that his echocardiogram shows the same reduced activity as it was in January 2016. We will continue medical management, advised patient to follow-up with Dr. Daleen Squibb as scheduled.

## 2015-04-01 NOTE — Telephone Encounter (Signed)
Verified name and date of birth and told patient that his echocardiogram shows the same reduced activity as it was in January 2016. We will continue medical management, advised patient to follow-up with Dr. Daleen Squibb as scheduled.  Reminded patient that his appointment to see Dr. Daleen Squibb was tomorrow at 3:45.  Patient verbalized understanding.

## 2015-04-02 ENCOUNTER — Ambulatory Visit: Payer: Self-pay | Attending: Cardiology | Admitting: Cardiology

## 2015-04-02 ENCOUNTER — Encounter: Payer: Self-pay | Admitting: Cardiology

## 2015-04-02 VITALS — BP 115/81 | HR 70 | Temp 98.6°F | Resp 18 | Ht 68.0 in | Wt 243.0 lb

## 2015-04-02 DIAGNOSIS — I10 Essential (primary) hypertension: Secondary | ICD-10-CM

## 2015-04-02 DIAGNOSIS — Z6836 Body mass index (BMI) 36.0-36.9, adult: Secondary | ICD-10-CM | POA: Insufficient documentation

## 2015-04-02 DIAGNOSIS — E669 Obesity, unspecified: Secondary | ICD-10-CM | POA: Insufficient documentation

## 2015-04-02 DIAGNOSIS — I5022 Chronic systolic (congestive) heart failure: Secondary | ICD-10-CM | POA: Insufficient documentation

## 2015-04-02 MED ORDER — OLMESARTAN MEDOXOMIL 20 MG PO TABS: 20.0000 mg | ORAL_TABLET | Freq: Every day | ORAL | Status: DC

## 2015-04-02 NOTE — Assessment & Plan Note (Signed)
He is stable on current medical therapy that he is running a little bit hypotensive. He's very compliant with his diet. I've reduced his Benicar to 20 mg once a day and stopped his hydrochlorothiazide. No change in his carvedilol. Since his EF does not improve with maximal medical therapy, I'm referring to Dr. Ladona Ridgel for consideration of a defibrillator. I spent a long time talking to the patient about this and he is educated and looking into it. All questions were answered. I'll see him back in 6 months.

## 2015-04-02 NOTE — Progress Notes (Signed)
Mr Cartee returns today for evaluation and management of his chronic systolic heart failure. This is felt to be secondary to poor control of his high blood pressure for number of years. A lot of this is been noncompliance. He's also had social barriers before he came to [place].  His blood pressures been running low. He is not taking his medications in 2 days. His blood pressure the morning was 99 systolic. Heart rate is been normal.  He denies any syncope or presyncope. He's had no palpitations. He denies chest pain. He denies orthopnea, PND or edema. We had to stop his spironolactone because of hyperkalemia.  Repeat echocardiogram shows no improvement in LV systolic function with an ejection fraction of 20%.  Exam today is in no acute distress. He is obese. Vital signs are stable. Resting heart rate 70. Neck shows no JVD. Lungs are clear to auscultation. Heart exam reveals a displaced PMI with normal S1-S2 with no gallop. Abdomen is slightly distended but good bowel sounds and is soft. Extremities reveal no edema. Pulses are intact.

## 2015-04-02 NOTE — Patient Instructions (Addendum)
Thank you for coming to see Dr. Daleen Squibb today. Please stop taking hydrochlorothiazide. Benicar has been decreased to 20mg  once a day. Please return to see Dr. Daleen Squibb in 6 months.  You will get a call from Dr. Lewayne Bunting for a cardiology appointment.

## 2015-04-02 NOTE — Progress Notes (Signed)
Patient here for follow up.  Patient reports no pain today. Patient had ECHO 8/26. Patient has been measuring blood pressure for 2 days and had low BP. Patient has not had BP meds for 2 days.  BP this morning 101/71, yesterday BP was 99/68.

## 2015-04-03 ENCOUNTER — Other Ambulatory Visit: Payer: Self-pay | Admitting: Family Medicine

## 2015-04-03 DIAGNOSIS — I10 Essential (primary) hypertension: Secondary | ICD-10-CM

## 2015-04-03 MED ORDER — OLMESARTAN MEDOXOMIL 20 MG PO TABS: 20.0000 mg | ORAL_TABLET | Freq: Every day | ORAL | Status: DC

## 2015-04-03 NOTE — Telephone Encounter (Signed)
Phoned in Rx for benicar to [facility] pharmacy. Found Rx near printer.

## 2015-04-04 ENCOUNTER — Telehealth: Payer: Self-pay

## 2015-04-04 NOTE — Telephone Encounter (Signed)
Please have patient to switch to equivalent doses

## 2015-04-04 NOTE — Telephone Encounter (Signed)
Dawn from health department called about this patient Wants to know if we can change his meds from lipitor to crestor nexium to dexalant  So that the patient can get the medications for free Can we switch these for this patient

## 2015-04-23 ENCOUNTER — Ambulatory Visit (INDEPENDENT_AMBULATORY_CARE_PROVIDER_SITE_OTHER): Payer: Self-pay | Admitting: Internal Medicine

## 2015-04-23 ENCOUNTER — Encounter: Payer: Self-pay | Admitting: *Deleted

## 2015-04-23 ENCOUNTER — Encounter: Payer: Self-pay | Admitting: Internal Medicine

## 2015-04-23 VITALS — BP 140/76 | HR 70 | Ht 67.0 in | Wt 248.4 lb

## 2015-04-23 DIAGNOSIS — I5022 Chronic systolic (congestive) heart failure: Secondary | ICD-10-CM

## 2015-04-23 DIAGNOSIS — I10 Essential (primary) hypertension: Secondary | ICD-10-CM

## 2015-04-23 DIAGNOSIS — E785 Hyperlipidemia, unspecified: Secondary | ICD-10-CM

## 2015-04-23 NOTE — Progress Notes (Signed)
HPI Mr. Woitas is referred by Dr. Daleen Squibb for consideration for ICD implant. He is a pleasant middle aged man with chronic systolic heart failure dating back years, who has had worsening symptoms over the past 18 months. He has never had syncope. He gets sob walking up any incline.  He can walk very slowly on level ground. He cannot lift anything without becoming sob. He developed fatigue on Benicar but after his dose was decreased, he felt better.  No Known Allergies   Current Outpatient Prescriptions  Medication Sig Dispense Refill  . aspirin 81 MG tablet Take 81 mg by mouth daily.    Marland Kitchen atorvastatin (LIPITOR) 20 MG tablet Take 20 mg by mouth 2 (two) times a week.    . carvedilol (COREG) 25 MG tablet Take 1 tablet (25 mg total) by mouth 2 (two) times daily with a meal. 180 tablet 3  . esomeprazole (NEXIUM) 40 MG capsule Take 40 mg by mouth daily as needed (heartburn).    . gabapentin (NEURONTIN) 300 MG capsule Take 300 mg by mouth daily as needed (nerve pain & when he is out of lyrica.).    Marland Kitchen insulin aspart (NOVOLOG) 100 UNIT/ML injection Inject 10 Units into the skin 2 (two) times daily with a meal. 3 vial 3  . insulin glargine (LANTUS) 100 UNIT/ML injection Inject 0.3 mLs (30 Units total) into the skin at bedtime. 3 vial 3  . loratadine (CLARITIN) 10 MG tablet Take 10 mg by mouth daily as needed for allergies.    . metFORMIN (GLUCOPHAGE) 1000 MG tablet Take 1 tablet (1,000 mg total) by mouth 2 (two) times daily with a meal. 180 tablet 3  . olmesartan (BENICAR) 20 MG tablet Take 1 tablet (20 mg total) by mouth daily. 30 tablet 11  . pregabalin (LYRICA) 50 MG capsule Take 50 mg by mouth 3 (three) times daily as needed (for nerve pain.).      No current facility-administered medications for this visit.     Past Medical History  Diagnosis Date  . Hypertension   . Diabetes mellitus without complication   . High cholesterol   . Kidney stone     ROS:   All systems reviewed and  negative except as noted in the HPI.   Past Surgical History  Procedure Laterality Date  . Colonoscopy N/A 07/12/2013    Procedure: COLONOSCOPY;  Surgeon: Shirley Friar, MD;  Location: [facility];  Service: Endoscopy;  Laterality: N/A;     Family History  Problem Relation Age of Onset  . Diabetes Mother   . Hypertension Mother   . Hyperlipidemia Mother   . Stroke Mother   . Diabetes Father   . Hypertension Father   . Stroke Father 34     Social History   Social History  . Marital Status: Married    Spouse Name: N/A  . Number of Children: N/A  . Years of Education: N/A   Occupational History  . Not on file.   Social History Main Topics  . Smoking status: Never Smoker   . Smokeless tobacco: Not on file  . Alcohol Use: No  . Drug Use: No  . Sexual Activity: Not on file   Other Topics Concern  . Not on file   Social History Narrative     BP 140/76 mmHg  Pulse 70  Ht 5\' 7"  (1.702 m)  Wt 248 lb 6.4 oz (112.674 kg)  BMI 38.90 kg/m2  Physical Exam:  Well  appearing 54 yo man, obese, NAD HEENT: Unremarkable Neck:  6 cm JVD, no thyromegally Lymphatics:  No adenopathy Back:  No CVA tenderness Lungs:  Clear with no wheezes HEART:  Regular rate rhythm, no murmurs, no rubs, no clicks Abd:  soft, positive bowel sounds, no organomegally, no rebound, no guarding Ext:  2 plus pulses, no edema, no cyanosis, no clubbing Skin:  No rashes no nodules Neuro:  CN II through XII intact, motor grossly intact  EKG - nsr with LBBB/IVCD   Assess/Plan:

## 2015-04-23 NOTE — Assessment & Plan Note (Signed)
He has class 3 symptoms and is on maximal medical therapy. He has LBBB/IVCD and a QRS duration of 135 ms. He has first degree AV block. Will plan to proceed with BiV ICD for secondary prevention. His EF is 20% by echo both in January and in August.

## 2015-04-23 NOTE — Assessment & Plan Note (Signed)
His blood pressure is controlled. Will follow.  

## 2015-04-23 NOTE — Assessment & Plan Note (Signed)
He will continue his statin. He is encouraged to lose weight.

## 2015-04-23 NOTE — Patient Instructions (Signed)
Medication Instructions: - no changes  Labwork: - see pre-procedure instruction sheet  Procedures/Testing: - Your physician has recommended that you have a defibrillator inserted. An implantable cardioverter defibrillator (ICD) is a small device that is placed in your chest or, in rare cases, your abdomen. This device uses electrical pulses or shocks to help control life-threatening, irregular heartbeats that could lead the heart to suddenly stop beating (sudden cardiac arrest). Leads are attached to the ICD that goes into your heart. This is done in the hospital and usually requires an overnight stay. Please see the instruction sheet given to you today for more information.  Follow-Up: - Your physician recommends that you schedule a follow-up appointment in: 10 days (from 05/09/15)- device clinic- wound check appointment.  Any Additional Special Instructions Will Be Listed Below (If Applicable). - none

## 2015-05-02 ENCOUNTER — Other Ambulatory Visit (INDEPENDENT_AMBULATORY_CARE_PROVIDER_SITE_OTHER): Payer: No Typology Code available for payment source | Admitting: *Deleted

## 2015-05-02 DIAGNOSIS — I5022 Chronic systolic (congestive) heart failure: Secondary | ICD-10-CM

## 2015-05-02 LAB — PROTIME-INR
INR: 1.1 ratio — AB (ref 0.8–1.0)
PROTHROMBIN TIME: 12.5 s (ref 9.6–13.1)

## 2015-05-02 LAB — CBC WITH DIFFERENTIAL/PLATELET
BASOS ABS: 0 10*3/uL (ref 0.0–0.1)
Basophils Relative: 0.5 % (ref 0.0–3.0)
EOS ABS: 0.3 10*3/uL (ref 0.0–0.7)
Eosinophils Relative: 4.1 % (ref 0.0–5.0)
HCT: 43.2 % (ref 39.0–52.0)
Hemoglobin: 13.9 g/dL (ref 13.0–17.0)
LYMPHS ABS: 1.4 10*3/uL (ref 0.7–4.0)
Lymphocytes Relative: 20.7 % (ref 12.0–46.0)
MCHC: 32.2 g/dL (ref 30.0–36.0)
MCV: 82.9 fl (ref 78.0–100.0)
MONOS PCT: 8.3 % (ref 3.0–12.0)
Monocytes Absolute: 0.5 10*3/uL (ref 0.1–1.0)
NEUTROS PCT: 66.4 % (ref 43.0–77.0)
Neutro Abs: 4.3 10*3/uL (ref 1.4–7.7)
Platelets: 160 10*3/uL (ref 150.0–400.0)
RBC: 5.21 Mil/uL (ref 4.22–5.81)
RDW: 16.8 % — ABNORMAL HIGH (ref 11.5–15.5)
WBC: 6.5 10*3/uL (ref 4.0–10.5)

## 2015-05-02 LAB — BASIC METABOLIC PANEL
BUN: 21 mg/dL (ref 6–23)
CALCIUM: 8.9 mg/dL (ref 8.4–10.5)
CO2: 29 mEq/L (ref 19–32)
Chloride: 104 mEq/L (ref 96–112)
Creatinine, Ser: 1.04 mg/dL (ref 0.40–1.50)
GFR: 79.31 mL/min (ref >60.00)
GLUCOSE: 185 mg/dL — AB (ref 70–99)
Potassium: 4.7 mEq/L (ref 3.5–5.1)
SODIUM: 139 meq/L (ref 135–145)

## 2015-05-09 ENCOUNTER — Encounter (HOSPITAL_COMMUNITY): Payer: Self-pay | Admitting: Cardiology

## 2015-05-09 ENCOUNTER — Ambulatory Visit (HOSPITAL_COMMUNITY)
Admission: RE | Admit: 2015-05-09 | Discharge: 2015-05-10 | Disposition: A | Discharge status: Home / Self Care | Payer: Self-pay | Source: Ambulatory Visit | Attending: Internal Medicine | Admitting: Internal Medicine

## 2015-05-09 ENCOUNTER — Encounter (HOSPITAL_COMMUNITY)
Admission: RE | Disposition: A | Discharge status: Home / Self Care | Payer: No Typology Code available for payment source | Source: Ambulatory Visit | Attending: Internal Medicine

## 2015-05-09 DIAGNOSIS — E7800 Pure hypercholesterolemia, unspecified: Secondary | ICD-10-CM | POA: Insufficient documentation

## 2015-05-09 DIAGNOSIS — I5022 Chronic systolic (congestive) heart failure: Secondary | ICD-10-CM | POA: Diagnosis present

## 2015-05-09 DIAGNOSIS — I428 Other cardiomyopathies: Secondary | ICD-10-CM | POA: Insufficient documentation

## 2015-05-09 DIAGNOSIS — I110 Hypertensive heart disease with heart failure: Secondary | ICD-10-CM | POA: Insufficient documentation

## 2015-05-09 DIAGNOSIS — E119 Type 2 diabetes mellitus without complications: Secondary | ICD-10-CM | POA: Insufficient documentation

## 2015-05-09 DIAGNOSIS — I447 Left bundle-branch block, unspecified: Secondary | ICD-10-CM | POA: Insufficient documentation

## 2015-05-09 DIAGNOSIS — Z23 Encounter for immunization: Secondary | ICD-10-CM | POA: Insufficient documentation

## 2015-05-09 DIAGNOSIS — Z95810 Presence of automatic (implantable) cardiac defibrillator: Secondary | ICD-10-CM | POA: Diagnosis present

## 2015-05-09 HISTORY — PX: EP IMPLANTABLE DEVICE: SHX172B

## 2015-05-09 LAB — GLUCOSE, CAPILLARY
GLUCOSE-CAPILLARY: 112 mg/dL — AB (ref 65–99)
GLUCOSE-CAPILLARY: 197 mg/dL — AB (ref 65–99)
Glucose-Capillary: 133 mg/dL — ABNORMAL HIGH (ref 65–99)
Glucose-Capillary: 192 mg/dL — ABNORMAL HIGH (ref 65–99)

## 2015-05-09 LAB — SURGICAL PCR SCREEN
MRSA, PCR: NEGATIVE
STAPHYLOCOCCUS AUREUS: NEGATIVE

## 2015-05-09 SURGERY — BIV ICD INSERTION CRT-D
Anesthesia: LOCAL

## 2015-05-09 MED ORDER — ONDANSETRON HCL 4 MG/2ML IJ SOLN: 4.0000 mg | Freq: Four times a day (QID) | INTRAMUSCULAR | Status: DC | PRN

## 2015-05-09 MED ORDER — MIDAZOLAM HCL 5 MG/5ML IJ SOLN
INTRAMUSCULAR | Status: AC
  Filled 2015-05-09: qty 25

## 2015-05-09 MED ORDER — SODIUM CHLORIDE 0.9 % IV SOLN
INTRAVENOUS | Status: DC
  Administered 2015-05-09: 11:00:00 via INTRAVENOUS

## 2015-05-09 MED ORDER — INSULIN ASPART 100 UNIT/ML FLEXPEN: 10.0000 [IU] | PEN_INJECTOR | Freq: Two times a day (BID) | SUBCUTANEOUS | Status: DC

## 2015-05-09 MED ORDER — LIDOCAINE HCL (PF) 1 % IJ SOLN
INTRAMUSCULAR | Status: AC
  Filled 2015-05-09: qty 60

## 2015-05-09 MED ORDER — LIDOCAINE HCL (PF) 1 % IJ SOLN
INTRAMUSCULAR | Status: DC | PRN
  Administered 2015-05-09: 30 mL via SUBCUTANEOUS

## 2015-05-09 MED ORDER — IRBESARTAN 75 MG PO TABS
37.5000 mg | ORAL_TABLET | Freq: Every day | ORAL | Status: DC
  Administered 2015-05-09 – 2015-05-10 (×2): 37.5 mg via ORAL
  Filled 2015-05-09 (×2): qty 0.5

## 2015-05-09 MED ORDER — SODIUM CHLORIDE 0.9 % IR SOLN
80.0000 mg | Status: DC
  Filled 2015-05-09: qty 2

## 2015-05-09 MED ORDER — CEFAZOLIN SODIUM-DEXTROSE 2-3 GM-% IV SOLR
INTRAVENOUS | Status: AC
  Filled 2015-05-09: qty 50

## 2015-05-09 MED ORDER — CHLORHEXIDINE GLUCONATE 4 % EX LIQD
60.0000 mL | Freq: Once | CUTANEOUS | Status: DC
  Filled 2015-05-09: qty 60

## 2015-05-09 MED ORDER — ACETAMINOPHEN 325 MG PO TABS: 325.0000 mg | ORAL_TABLET | ORAL | Status: DC | PRN

## 2015-05-09 MED ORDER — CARVEDILOL 25 MG PO TABS
25.0000 mg | ORAL_TABLET | Freq: Two times a day (BID) | ORAL | Status: DC
  Administered 2015-05-09 – 2015-05-10 (×2): 25 mg via ORAL
  Filled 2015-05-09 (×2): qty 1

## 2015-05-09 MED ORDER — ASPIRIN EC 81 MG PO TBEC
81.0000 mg | DELAYED_RELEASE_TABLET | Freq: Every day | ORAL | Status: DC
  Administered 2015-05-09 – 2015-05-10 (×2): 81 mg via ORAL
  Filled 2015-05-09 (×2): qty 1

## 2015-05-09 MED ORDER — CEFAZOLIN SODIUM 1-5 GM-% IV SOLN
1.0000 g | Freq: Four times a day (QID) | INTRAVENOUS | Status: AC
  Administered 2015-05-09 – 2015-05-10 (×3): 1 g via INTRAVENOUS
  Filled 2015-05-09 (×4): qty 50

## 2015-05-09 MED ORDER — CEFAZOLIN SODIUM 1-5 GM-% IV SOLN: 1.0000 g | Freq: Four times a day (QID) | INTRAVENOUS | Status: DC

## 2015-05-09 MED ORDER — HEPARIN (PORCINE) IN NACL 2-0.9 UNIT/ML-% IJ SOLN
INTRAMUSCULAR | Status: AC
  Filled 2015-05-09: qty 1000

## 2015-05-09 MED ORDER — PREGABALIN 75 MG PO CAPS
150.0000 mg | ORAL_CAPSULE | Freq: Every day | ORAL | Status: DC
  Administered 2015-05-09: 150 mg via ORAL
  Filled 2015-05-09: qty 2

## 2015-05-09 MED ORDER — MUPIROCIN 2 % EX OINT
TOPICAL_OINTMENT | CUTANEOUS | Status: AC
  Administered 2015-05-09: 1
  Filled 2015-05-09: qty 22

## 2015-05-09 MED ORDER — VANCOMYCIN HCL IN DEXTROSE 1-5 GM/200ML-% IV SOLN
1000.0000 mg | INTRAVENOUS | Status: DC
  Filled 2015-05-09: qty 200

## 2015-05-09 MED ORDER — FENTANYL CITRATE (PF) 100 MCG/2ML IJ SOLN
INTRAMUSCULAR | Status: DC | PRN
  Administered 2015-05-09: 12.5 ug via INTRAVENOUS
  Administered 2015-05-09: 25 ug via INTRAVENOUS
  Administered 2015-05-09 (×4): 12.5 ug via INTRAVENOUS

## 2015-05-09 MED ORDER — MORPHINE SULFATE (PF) 2 MG/ML IV SOLN
2.0000 mg | INTRAVENOUS | Status: DC | PRN
  Administered 2015-05-09 – 2015-05-10 (×3): 2 mg via INTRAVENOUS
  Filled 2015-05-09 (×3): qty 1

## 2015-05-09 MED ORDER — ROSUVASTATIN CALCIUM 20 MG PO TABS
20.0000 mg | ORAL_TABLET | Freq: Every day | ORAL | Status: DC
  Administered 2015-05-09: 20 mg via ORAL
  Filled 2015-05-09: qty 1

## 2015-05-09 MED ORDER — INFLUENZA VAC SPLIT QUAD 0.5 ML IM SUSY
0.5000 mL | PREFILLED_SYRINGE | INTRAMUSCULAR | Status: AC
  Administered 2015-05-10: 0.5 mL via INTRAMUSCULAR
  Filled 2015-05-09: qty 0.5

## 2015-05-09 MED ORDER — TRAMADOL HCL 50 MG PO TABS
50.0000 mg | ORAL_TABLET | Freq: Four times a day (QID) | ORAL | Status: DC | PRN
Start: 2015-05-09 — End: 2015-05-10
  Administered 2015-05-09: 50 mg via ORAL
  Filled 2015-05-09: qty 1

## 2015-05-09 MED ORDER — IOHEXOL 350 MG/ML SOLN
INTRAVENOUS | Status: DC | PRN
  Administered 2015-05-09: 60 mL via INTRAVENOUS

## 2015-05-09 MED ORDER — INSULIN ASPART 100 UNIT/ML ~~LOC~~ SOLN
10.0000 [IU] | Freq: Two times a day (BID) | SUBCUTANEOUS | Status: DC
  Administered 2015-05-09 – 2015-05-10 (×2): 10 [IU] via SUBCUTANEOUS

## 2015-05-09 MED ORDER — FENTANYL CITRATE (PF) 100 MCG/2ML IJ SOLN
INTRAMUSCULAR | Status: AC
  Filled 2015-05-09: qty 4

## 2015-05-09 MED ORDER — INSULIN GLARGINE 100 UNITS/ML SOLOSTAR PEN: 30.0000 [IU] | PEN_INJECTOR | Freq: Every day | SUBCUTANEOUS | Status: DC

## 2015-05-09 MED ORDER — CEFAZOLIN SODIUM-DEXTROSE 2-3 GM-% IV SOLR
INTRAVENOUS | Status: DC | PRN
  Administered 2015-05-09: 2 g via INTRAVENOUS

## 2015-05-09 MED ORDER — SODIUM CHLORIDE 0.9 % IR SOLN
Status: AC
  Filled 2015-05-09: qty 2

## 2015-05-09 MED ORDER — INSULIN GLARGINE 100 UNIT/ML ~~LOC~~ SOLN
30.0000 [IU] | Freq: Every day | SUBCUTANEOUS | Status: DC
  Administered 2015-05-09: 30 [IU] via SUBCUTANEOUS
  Filled 2015-05-09 (×2): qty 0.3

## 2015-05-09 MED ORDER — MIDAZOLAM HCL 5 MG/5ML IJ SOLN
INTRAMUSCULAR | Status: DC | PRN
  Administered 2015-05-09 (×7): 1 mg via INTRAVENOUS

## 2015-05-09 SURGICAL SUPPLY — 19 items
ASSURA CRTD CD3369-40C (ICD Generator) ×2 IMPLANT
CABLE SURGICAL S-101-97-12 (CABLE) ×1 IMPLANT
CATH CPS DIRECT 135 DS2C020 (CATHETERS) ×1 IMPLANT
CATH CPS QUART SUB DS2N028-65 (CATHETERS) ×1 IMPLANT
CATH HEX JOSEPH 2-5-2 65CM 6F (CATHETERS) ×1 IMPLANT
CPS IMPLANT KIT 410190 (MISCELLANEOUS) ×1 IMPLANT
DEFIB ASSURA CRT-D (ICD Generator) IMPLANT
KIT ESSENTIALS PG (KITS) ×1 IMPLANT
LEAD DURATA 7122-65CM (Lead) ×1 IMPLANT
LEAD QUARTET 1458Q-86CM (Lead) ×1 IMPLANT
LEAD TENDRIL SDX 1688TC-52CM (Lead) ×1 IMPLANT
PAD DEFIB LIFELINK (PAD) ×1 IMPLANT
SHEATH CLASSIC 7F (SHEATH) ×2 IMPLANT
SHEATH WORLEY 9FR 62CM (SHEATH) ×1 IMPLANT
SLITTER UNIVERSAL DS2A003 (MISCELLANEOUS) ×1 IMPLANT
TRAY PACEMAKER INSERTION (CUSTOM PROCEDURE TRAY) ×1 IMPLANT
WIRE ACUITY WHISPER EDS 4648 (WIRE) ×1 IMPLANT
WIRE LUGE 182CM (WIRE) ×1 IMPLANT
WIRE MAILMAN 182CM (WIRE) ×1 IMPLANT

## 2015-05-09 NOTE — H&P (Signed)
ICD Criteria  Current LVEF:25%. Within 12 months prior to implant: Yes   Heart failure history: Yes, Class II  Cardiomyopathy history: Yes, Non-Ischemic Cardiomyopathy.  Atrial Fibrillation/Atrial Flutter: No.  Ventricular tachycardia history: No.  Cardiac arrest history: No.  History of syndromes with risk of sudden death: No.  Previous ICD: No.  Current ICD indication: Primary  PPM indication: No.   Class I or II Bradycardia indication present: No  Beta Blocker therapy for 3 or more months: Yes, prescribed.   Ace Inhibitor/ARB therapy for 3 or more months: Yes, prescribed.    

## 2015-05-09 NOTE — Progress Notes (Signed)
Orthopedic Tech Progress Note Patient Details:  Benjamin [name] May 07, 1961 [id number]  Ortho Devices Type of Ortho Device: Arm sling Ortho Device/Splint Location: LUE Ortho Device/Splint Interventions: Ordered, Application   Jennye Moccasin 05/09/2015, 5:01 PM

## 2015-05-09 NOTE — Discharge Instructions (Signed)
° ° °  Supplemental Discharge Instructions for  °Pacemaker/Defibrillator Patients ° °Activity °No heavy lifting or vigorous activity with your left/right arm for 6 to 8 weeks.  Do not raise your left/right arm above your head for one week.  Gradually raise your affected arm as drawn below. ° °        ° °__      05-13-15                 05-14-15               05-15-15               05-16-15 ° °NO DRIVING for 1 week    ; you may begin driving on   05-16-15  . ° °WOUND CARE °- Keep the wound area clean and dry.  Do not get this area wet for one week. No showers for one week; you may shower on   05-16-15  . °- The tape/steri-strips on your wound will fall off; do not pull them off.  No bandage is needed on the site.  DO  NOT apply any creams, oils, or ointments to the wound area. °- If you notice any drainage or discharge from the wound, any swelling or bruising at the site, or you develop a fever > 101? F after you are discharged home, call the office at once. ° °Special Instructions °- You are still able to use cellular telephones; use the ear opposite the side where you have your pacemaker/defibrillator.  Avoid carrying your cellular phone near your device. °- When traveling through airports, show security personnel your identification card to avoid being screened in the metal detectors.  Ask the security personnel to use the hand wand. °- Avoid arc welding equipment, MRI testing (magnetic resonance imaging), TENS units (transcutaneous nerve stimulators).  Call the office for questions about other devices. °- Avoid electrical appliances that are in poor condition or are not properly grounded. °- Microwave ovens are safe to be near or to operate. ° °Additional information for defibrillator patients should your device go off: °- If your device goes off ONCE and you feel fine afterward, notify the device clinic nurses. °- If your device goes off ONCE and you do not feel well afterward, call 911. °- If your device goes  off TWICE, call 911. °- If your device goes off THREE times in one day, call 911. ° °DO NOT DRIVE YOURSELF OR A FAMILY MEMBER °WITH A DEFIBRILLATOR TO THE HOSPITAL--CALL 911. ° °

## 2015-05-10 ENCOUNTER — Ambulatory Visit (HOSPITAL_COMMUNITY): Payer: No Typology Code available for payment source

## 2015-05-10 DIAGNOSIS — I5022 Chronic systolic (congestive) heart failure: Secondary | ICD-10-CM

## 2015-05-10 DIAGNOSIS — I429 Cardiomyopathy, unspecified: Secondary | ICD-10-CM

## 2015-05-10 LAB — GLUCOSE, CAPILLARY: GLUCOSE-CAPILLARY: 226 mg/dL — AB (ref 65–99)

## 2015-05-10 MED ORDER — TRAMADOL HCL 50 MG PO TABS: 50.0000 mg | ORAL_TABLET | Freq: Four times a day (QID) | ORAL | Status: DC | PRN

## 2015-05-10 NOTE — Discharge Summary (Signed)
Physician Discharge Summary  Patient ID: Benjamin [name] MRN: [id number] DOB/AGE: 02/27/1961 54 y.o.   Primary Cardiologist: Dr. Daleen Squibb  Electrophysiologist: Dr. Ladona Ridgel  Admit date: 05/09/2015 Discharge date: 05/10/2015  Admission Diagnoses: Chronic Systolic CHF + LBBB  Discharge Diagnoses:  Active Problems:   Chronic systolic congestive heart failure (HCC)   S/P ICD (internal cardiac defibrillator) procedure   Discharged Condition: stable  Hospital Course: 54 y.o. male with a nonischemic CM (EF 25%), NYHA Class III CHF, and LBBB QRS morphology, meeting MADIT II/ SCD-HeFT criteria for ICD implantation for primary prevention of sudden death, admitted for elective ICD implantation. Given his LBBB, it was decided to implant a biventricular ICD. The procedure was performed by Dr. Graciela Husbands on 10/7//16. He underwent successful implantation of a [employer] ICD. He tolerated the procedure well and left the EP lab in stable condition. He had no post op complications. Post operative CXR showed proper lead placement and no evidence of pneumothorax. Device interrogation revealed normal functioning. He was last seen and examined by Dr. Graciela Husbands, who determined he was stable for discharge home. He was continued on his HF medications. Wound check f/u is scheduled for 05/19/15.   Consults: None  Significant Diagnostic Studies:   Treatments: See Hospital Course  Discharge Exam: Blood pressure 123/83, pulse 70, temperature 98.8 F (37.1 C), temperature source Oral, resp. rate 20, height 5\' 7"  (1.702 m), weight 256 lb 8 oz (116.348 kg), SpO2 99 %.   Disposition: 01-Home or Self Care      Discharge Instructions    Diet - low sodium heart healthy    Complete by:  As directed      Increase activity slowly    Complete by:  As directed             Medication List    TAKE these medications        aspirin EC 81 MG tablet  Take 81 mg by mouth daily.     carvedilol 25 MG tablet  Commonly known as:  COREG   Take 1 tablet (25 mg total) by mouth 2 (two) times daily with a meal.     NOVOLOG FLEXPEN 100 UNIT/ML FlexPen  Generic drug:  insulin aspart  Inject 10 Units into the skin 2 (two) times daily with a meal.     insulin aspart 100 UNIT/ML injection  Commonly known as:  novoLOG  Inject 10 Units into the skin 2 (two) times daily with a meal.     insulin glargine 100 unit/mL Sopn  Commonly known as:  LANTUS  Inject 30 Units into the skin at bedtime.     insulin glargine 100 UNIT/ML injection  Commonly known as:  LANTUS  Inject 0.3 mLs (30 Units total) into the skin at bedtime.     metFORMIN 1000 MG tablet  Commonly known as:  GLUCOPHAGE  Take 1 tablet (1,000 mg total) by mouth 2 (two) times daily with a meal.     olmesartan 20 MG tablet  Commonly known as:  BENICAR  Take 1 tablet (20 mg total) by mouth daily.     pregabalin 75 MG capsule  Commonly known as:  LYRICA  Take 150 mg by mouth at bedtime.     rosuvastatin 20 MG tablet  Commonly known as:  CRESTOR  Take 20 mg by mouth daily.       Follow-up Information    Follow up with [facility] On 05/19/2015.   Why:  at 11:30AM for wound  check   Contact information:   [address] [place] [address]      TIME SPENT ON DISCHARGE, INCLUDING PHYSICIAN TIME: >30 MINUTES  Signed: Robbie Lis 05/10/2015, 2:59 PM

## 2015-05-10 NOTE — Progress Notes (Signed)
Pt given discharge instructions, verbalized understanding. IV and tele removed. Taken out via wheelchair. Questions answered.

## 2015-05-10 NOTE — Progress Notes (Signed)
12 lead EKG done per order, MD made aware . MD said okay to d/c patient. Patient d/c home per order.

## 2015-05-10 NOTE — Progress Notes (Signed)
Patient Name: Benjamin [name]      SUBJECTIVE: without sob but pain at site  Past Medical History  Diagnosis Date  . Hypertension   . Diabetes mellitus without complication (HCC)   . High cholesterol   . Kidney stone     Scheduled Meds:  Scheduled Meds: . aspirin EC  81 mg Oral Daily  . carvedilol  25 mg Oral BID WC  . insulin aspart  10 Units Subcutaneous BID WC  . insulin glargine  30 Units Subcutaneous QHS  . irbesartan  37.5 mg Oral Daily  . pregabalin  150 mg Oral QHS  . rosuvastatin  20 mg Oral q1800   Continuous Infusions:  acetaminophen, morphine injection, ondansetron (ZOFRAN) IV, traMADol    PHYSICAL EXAM Filed Vitals:   05/10/15 0526 05/10/15 1022 05/10/15 1217 05/10/15 1219  BP: 120/72 141/95 113/52 123/83  Pulse: 70 77 70 70  Temp: 98.9 F (37.2 C)  98 F (36.7 C) 98.8 F (37.1 C)  TempSrc: Oral  Oral Oral  Resp: Height:      Weight: 256 lb 8 oz (116.348 kg)     SpO2: 96% 90% 94% 99%    Well developed and nourished in no acute distress HENT normal Neck supple with JVP-flat Clear Pocket without  hematoma, swelling or tenderness  Regular rate and rhythm, no murmurs or gallops Abd-soft with active BS No Clubbing cyanosis edema Skin-warm and dry A & Oriented  Grossly normal sensory and motor function   TELEMETRY: Reviewed telemetry pt in .P-synchronous/ AV  pacing :    Intake/Output Summary (Last 24 hours) at 05/10/15 1311 Last data filed at 05/10/15 0956  Gross per 24 hour  Intake    730 ml  Output      0 ml  Net    730 ml    LABS: Basic Metabolic Panel: No results for input(s): NA, K, CL, CO2, GLUCOSE, BUN, CREATININE, CALCIUM, MG, PHOS in the last 168 hours. Cardiac Enzymes: No results for input(s): CKTOTAL, CKMB, CKMBINDEX, TROPONINI in the last 72 hours. CBC: No results for input(s): WBC, NEUTROABS, HGB, HCT, MCV, PLT in the last 168 hours. PROTIME: No results for input(s): LABPROT, INR in the last 72  hours. Liver Function Tests: No results for input(s): AST, ALT, ALKPHOS, BILITOT, PROT, ALBUMIN in the last 72 hours. No results for input(s): LIPASE, AMYLASE in the last 72 hours. BNP: BNP (last 3 results) No results for input(s): BNP in the last 8760 hours.  ProBNP (last 3 results) No results for input(s): PROBNP in the last 8760 hours.  D-Dimer: No results for input(s): DDIMER in the last 72 hours. Hemoglobin A1C: No results for input(s): HGBA1C in the last 72 hours. Fasting Lipid Panel: No results for input(s): CHOL, HDL, LDLCALC, TRIG, CHOLHDL, LDLDIRECT in the last 72 hours. Thyroid Function Tests: No results for input(s): TSH, T4TOTAL, T3FREE, THYROIDAB in the last 72 hours.  Invalid input(s): FREET3 Anemia Panel: No results for input(s): VITAMINB12, FOLATE, FERRITIN, TIBC, IRON, RETICCTPCT in the last 72 hours.  CXR suggests  Device Interrogation: normal device funciton   ASSESSMENT AND PLAN:  Active Problems:   Chronic systolic congestive heart failure (HCC)   S/P ICD (internal cardiac defibrillator) procedure  There appears to be significant interval dis location of the LV lead based on the description from the operative note.He will need chest x-ray follow-up.  Discharge today with pain meds    Will need  ECG at followup  Will let GT know  Signed, Sherryl Manges MD  05/10/2015

## 2015-05-12 ENCOUNTER — Encounter (HOSPITAL_COMMUNITY): Payer: Self-pay | Admitting: Internal Medicine

## 2015-05-13 MED FILL — Heparin Sodium (Porcine) 2 Unit/ML in Sodium Chloride 0.9%: INTRAMUSCULAR | Qty: 1000 | Status: AC

## 2015-05-15 ENCOUNTER — Encounter (HOSPITAL_COMMUNITY): Payer: Self-pay | Admitting: Internal Medicine

## 2015-05-19 ENCOUNTER — Ambulatory Visit (HOSPITAL_COMMUNITY)
Admission: RE | Admit: 2015-05-19 | Discharge: 2015-05-19 | Disposition: A | Discharge status: Home / Self Care | Payer: No Typology Code available for payment source | Source: Ambulatory Visit | Attending: Internal Medicine | Admitting: Internal Medicine

## 2015-05-19 ENCOUNTER — Encounter: Payer: Self-pay | Admitting: Internal Medicine

## 2015-05-19 ENCOUNTER — Ambulatory Visit (INDEPENDENT_AMBULATORY_CARE_PROVIDER_SITE_OTHER): Payer: No Typology Code available for payment source | Admitting: *Deleted

## 2015-05-19 DIAGNOSIS — I5022 Chronic systolic (congestive) heart failure: Secondary | ICD-10-CM

## 2015-05-19 DIAGNOSIS — R0989 Other specified symptoms and signs involving the circulatory and respiratory systems: Secondary | ICD-10-CM | POA: Insufficient documentation

## 2015-05-19 DIAGNOSIS — Z95810 Presence of automatic (implantable) cardiac defibrillator: Secondary | ICD-10-CM | POA: Insufficient documentation

## 2015-05-19 DIAGNOSIS — I517 Cardiomegaly: Secondary | ICD-10-CM | POA: Insufficient documentation

## 2015-05-21 LAB — CUP PACEART INCLINIC DEVICE CHECK
Battery Remaining Longevity: 51.6
Brady Statistic RV Percent Paced: 96 %
Date Time Interrogation Session: 20161017154941
HIGH POWER IMPEDANCE MEASURED VALUE: 54 Ohm
Implantable Lead Implant Date: 20161007
Implantable Lead Location: 753858
Implantable Lead Location: 753860
Lead Channel Impedance Value: 425 Ohm
Lead Channel Pacing Threshold Amplitude: 0.75 V
Lead Channel Pacing Threshold Amplitude: 0.75 V
Lead Channel Pacing Threshold Amplitude: 2.5 V
Lead Channel Pacing Threshold Pulse Width: 0.5 ms
Lead Channel Pacing Threshold Pulse Width: 0.5 ms
Lead Channel Pacing Threshold Pulse Width: 0.5 ms
Lead Channel Sensing Intrinsic Amplitude: 12 mV
Lead Channel Sensing Intrinsic Amplitude: 5 mV
Lead Channel Setting Pacing Amplitude: 3 V
Lead Channel Setting Pacing Amplitude: 3.5 V
Lead Channel Setting Pacing Pulse Width: 0.5 ms
MDC IDC LEAD IMPLANT DT: 20161007
MDC IDC LEAD IMPLANT DT: 20161007
MDC IDC LEAD LOCATION: 753859
MDC IDC LEAD MODEL: 7122
MDC IDC MSMT LEADCHNL LV IMPEDANCE VALUE: 462.5 Ohm
MDC IDC MSMT LEADCHNL LV PACING THRESHOLD AMPLITUDE: 2.5 V
MDC IDC MSMT LEADCHNL LV PACING THRESHOLD PULSEWIDTH: 0.5 ms
MDC IDC MSMT LEADCHNL RA PACING THRESHOLD AMPLITUDE: 0.75 V
MDC IDC MSMT LEADCHNL RA PACING THRESHOLD AMPLITUDE: 0.75 V
MDC IDC MSMT LEADCHNL RA PACING THRESHOLD PULSEWIDTH: 0.5 ms
MDC IDC MSMT LEADCHNL RA PACING THRESHOLD PULSEWIDTH: 0.5 ms
MDC IDC MSMT LEADCHNL RV IMPEDANCE VALUE: 550 Ohm
MDC IDC PG SERIAL: 7294919
MDC IDC SET LEADCHNL RA PACING AMPLITUDE: 3.5 V
MDC IDC SET LEADCHNL RV PACING PULSEWIDTH: 0.5 ms
MDC IDC SET LEADCHNL RV SENSING SENSITIVITY: 0.5 mV
MDC IDC STAT BRADY RA PERCENT PACED: 4.9 %

## 2015-05-21 NOTE — Progress Notes (Signed)
Wound check appointment. Steri-strips removed. Wound without redness or edema. Incision edges approximated, wound well healed. Normal device function. RA/RV/LV2 thresholds, sensing, and impedances consistent with implant measurements. LV1 threshold increase noted---now 2.5V @ 0.57ms (was 1.0V@0 .22ms on 05/10/15)---output increased to 3.0V (+0.5V safety margin). Device programmed at 3.5V for extra safety margin until 3 month visit.  CXR ordered, EKG performed per SK order from hospital. Histogram distribution appropriate for patient and level of activity. No mode switches or ventricular arrhythmias noted. Patient educated about wound care, arm mobility, lifting restrictions, shock plan. ROV in 6 weeks w/AS and in 3 months w/GT.

## 2015-05-26 ENCOUNTER — Ambulatory Visit: Payer: No Typology Code available for payment source | Attending: Internal Medicine

## 2015-06-03 ENCOUNTER — Telehealth: Payer: Self-pay | Admitting: Internal Medicine

## 2015-06-03 NOTE — Telephone Encounter (Signed)
metFORMIN (GLUCOPHAGE) 1000 MG tablet ° °

## 2015-06-04 ENCOUNTER — Other Ambulatory Visit: Payer: Self-pay | Admitting: *Deleted

## 2015-06-04 ENCOUNTER — Encounter: Payer: Self-pay | Admitting: Internal Medicine

## 2015-06-04 MED ORDER — METFORMIN HCL 1000 MG PO TABS: 1000.0000 mg | ORAL_TABLET | Freq: Two times a day (BID) | ORAL | Status: DC

## 2015-06-04 NOTE — Telephone Encounter (Signed)
Patients refill on Metformin has been placed.

## 2015-06-11 ENCOUNTER — Other Ambulatory Visit: Payer: Self-pay | Admitting: *Deleted

## 2015-06-11 NOTE — Telephone Encounter (Signed)
Patients metformin was filled [facility] instead of [facility].

## 2015-06-30 ENCOUNTER — Ambulatory Visit (INDEPENDENT_AMBULATORY_CARE_PROVIDER_SITE_OTHER): Payer: No Typology Code available for payment source | Admitting: Nurse Practitioner

## 2015-06-30 ENCOUNTER — Encounter: Payer: Self-pay | Admitting: Nurse Practitioner

## 2015-06-30 VITALS — BP 124/78 | HR 76 | Ht 67.0 in | Wt 242.6 lb

## 2015-06-30 DIAGNOSIS — I10 Essential (primary) hypertension: Secondary | ICD-10-CM

## 2015-06-30 DIAGNOSIS — I429 Cardiomyopathy, unspecified: Secondary | ICD-10-CM

## 2015-06-30 DIAGNOSIS — I428 Other cardiomyopathies: Secondary | ICD-10-CM

## 2015-06-30 DIAGNOSIS — I5022 Chronic systolic (congestive) heart failure: Secondary | ICD-10-CM

## 2015-06-30 LAB — CUP PACEART INCLINIC DEVICE CHECK
Date Time Interrogation Session: 20161128140546
Implantable Lead Implant Date: 20161007
Implantable Lead Implant Date: 20161007
Implantable Lead Location: 753859
Implantable Lead Location: 753860
Lead Channel Impedance Value: 490 Ohm
Lead Channel Impedance Value: 700 Ohm
Lead Channel Pacing Threshold Amplitude: 1 V
Lead Channel Pacing Threshold Pulse Width: 0.5 ms
Lead Channel Sensing Intrinsic Amplitude: 4.3 mV
Lead Channel Setting Sensing Sensitivity: 0.5 mV
MDC IDC LEAD IMPLANT DT: 20161007
MDC IDC LEAD LOCATION: 753858
MDC IDC LEAD MODEL: 7122
MDC IDC MSMT LEADCHNL LV PACING THRESHOLD AMPLITUDE: 1 V
MDC IDC MSMT LEADCHNL LV PACING THRESHOLD PULSEWIDTH: 0.5 ms
MDC IDC MSMT LEADCHNL RA IMPEDANCE VALUE: 430 Ohm
MDC IDC MSMT LEADCHNL RV PACING THRESHOLD AMPLITUDE: 1 V
MDC IDC MSMT LEADCHNL RV PACING THRESHOLD PULSEWIDTH: 0.5 ms
MDC IDC MSMT LEADCHNL RV SENSING INTR AMPL: 12 mV
MDC IDC SET LEADCHNL LV PACING AMPLITUDE: 3 V
MDC IDC SET LEADCHNL LV PACING PULSEWIDTH: 0.5 ms
MDC IDC SET LEADCHNL RA PACING AMPLITUDE: 3.5 V
MDC IDC SET LEADCHNL RV PACING AMPLITUDE: 3.5 V
MDC IDC SET LEADCHNL RV PACING PULSEWIDTH: 0.5 ms
Pulse Gen Serial Number: 7294919

## 2015-06-30 LAB — BASIC METABOLIC PANEL
BUN: 20 mg/dL (ref 7–25)
CHLORIDE: 102 mmol/L (ref 98–110)
CO2: 26 mmol/L (ref 20–31)
Calcium: 9 mg/dL (ref 8.6–10.3)
Creat: 1 mg/dL (ref 0.70–1.33)
Glucose, Bld: 213 mg/dL — ABNORMAL HIGH (ref 65–99)
POTASSIUM: 4.7 mmol/L (ref 3.5–5.3)
Sodium: 135 mmol/L (ref 135–146)

## 2015-06-30 NOTE — Patient Instructions (Addendum)
Medication Instructions:   START TAKING HCTZ 25 MG ONCE A DAY     If you need a refill on your cardiac medications before your next appointment, please call your pharmacy.  Labwork: BMET TODAY     Testing/Procedures:  Your physician has requested that you have an echocardiogram. IN April 2017.Marland KitchenEchocardiography is a painless test that uses sound waves to create images of your heart. It provides your doctor with information about the size and shape of your heart and how well your heart's chambers and valves are working. This procedure takes approximately one hour. There are no restrictions for this procedure.     Follow-Up:    IN 6 WEEKS WITH DR Ladona Ridgel    Any Other Special Instructions Will Be Listed Below (If Applicable).

## 2015-06-30 NOTE — Progress Notes (Signed)
Electrophysiology Office Note Date: 06/30/2015  ID:  Benjamin [name], DOB 09/03/1960, MRN [id number]  PCP: Jeanann Lewandowsky, MD Primary Cardiologist: Daleen Squibb Electrophysiologist: Ladona Ridgel  CC: 6 week post CRT follow up  Benjamin [name] is a 54 y.o. male seen today for Dr Ladona Ridgel.  He presents today for post CRT followup.  Since discharge, the patient reports doing reasonably well. He has persistent orthopnea and dyspnea on exertion (improved some from implant) but denies chest pain, palpitations, dyspnea, PND, orthopnea, nausea, vomiting, dizziness, syncope, edema, weight gain, or early satiety.  He has not had ICD shocks.   Device History: STJ CRTD implanted 2016 for NICM, CHF, LBBB History of appropriate therapy: No History of AAD therapy: No   Past Medical History  Diagnosis Date  . Hypertension   . Diabetes mellitus without complication (HCC)   . High cholesterol   . Kidney stone    Past Surgical History  Procedure Laterality Date  . Colonoscopy N/A 07/12/2013    Procedure: COLONOSCOPY;  Surgeon: Shirley Friar, MD;  Location: [facility];  Service: Endoscopy;  Laterality: N/A;  . Ep implantable device N/A 05/09/2015    Procedure: BiV ICD Insertion CRT-D;  Surgeon: Marinus Maw, MD;  Location: [facility] INVASIVE CV LAB;  Service: Cardiovascular;  Laterality: N/A;    Current Outpatient Prescriptions  Medication Sig Dispense Refill  . aspirin EC 81 MG tablet Take 81 mg by mouth daily.    . carvedilol (COREG) 25 MG tablet Take 1 tablet (25 mg total) by mouth 2 (two) times daily with a meal. 180 tablet 3  . insulin aspart (NOVOLOG FLEXPEN) 100 UNIT/ML FlexPen Inject 10 Units into the skin 2 (two) times daily with a meal.    . insulin aspart (NOVOLOG) 100 UNIT/ML injection Inject 10 Units into the skin 2 (two) times daily with a meal. 3 vial 3  . insulin glargine (LANTUS) 100 UNIT/ML injection Inject 0.3 mLs (30 Units total) into the skin at bedtime. 3 vial 3  . insulin glargine  (LANTUS) 100 unit/mL SOPN Inject 30 Units into the skin at bedtime.    . metFORMIN (GLUCOPHAGE) 1000 MG tablet Take 1 tablet (1,000 mg total) by mouth 2 (two) times daily with a meal. 180 tablet 3  . olmesartan (BENICAR) 20 MG tablet Take 1 tablet (20 mg total) by mouth daily. 30 tablet 11  . pregabalin (LYRICA) 75 MG capsule Take 150 mg by mouth at bedtime.    . rosuvastatin (CRESTOR) 20 MG tablet Take 20 mg by mouth daily.    . traMADol (ULTRAM) 50 MG tablet Take 1 tablet (50 mg total) by mouth every 6 (six) hours as needed for moderate pain or severe pain. 30 tablet 0   No current facility-administered medications for this visit.    Allergies:   Review of patient's allergies indicates no known allergies.   Social History: Social History   Social History  . Marital Status: Married    Spouse Name: N/A  . Number of Children: N/A  . Years of Education: N/A   Occupational History  . Not on file.   Social History Main Topics  . Smoking status: Never Smoker   . Smokeless tobacco: Not on file  . Alcohol Use: No  . Drug Use: No  . Sexual Activity: Not on file   Other Topics Concern  . Not on file   Social History Narrative    Family History: Family History  Problem Relation Age of Onset  .  Diabetes Mother   . Hypertension Mother   . Hyperlipidemia Mother   . Stroke Mother   . Diabetes Father   . Hypertension Father   . Stroke Father 30    Review of Systems: All other systems reviewed and are otherwise negative except as noted above.   Physical Exam: VS:  BP 124/78 mmHg  Pulse 76  Ht  (1.702 m)  Wt 242 lb 9.6 oz (110.043 kg)  BMI 37.99 kg/m2 , BMI Body mass index is 37.99 kg/(m^2).  GEN- The patient is obese appearing, alert and oriented x 3 today.   HEENT: normocephalic, atraumatic; sclera clear, conjunctiva pink; hearing intact; oropharynx clear; neck supple  Lungs- Decreased breath sounds throughout, normal work of breathing.  No wheezes, rales,  rhonchi Heart- Regular rate and rhythm (paced) GI- soft, non-tender, non-distended, bowel sounds present  Extremities- no clubbing, cyanosis, or edema; DP/PT/radial pulses 2+ bilaterally MS- no significant deformity or atrophy Skin- warm and dry, no rash or lesion; ICD pocket well healed Psych- euthymic mood, full affect Neuro- strength and sensation are intact  ICD interrogation- reviewed in detail today,  See PACEART report  EKG:  EKG is ordered today. The ekg ordered today shows sinus rhythm with V pacing  Recent Labs: 08/10/2014: ALT 21 05/02/2015: BUN 21; Creatinine, Ser 1.04; Hemoglobin 13.9; Platelets 160.0; Potassium 4.7; Sodium 139   Wt Readings from Last 3 Encounters:  06/30/15 242 lb 9.6 oz (110.043 kg)  05/10/15 256 lb 8 oz (116.348 kg)  04/23/15 248 lb 6.4 oz (112.674 kg)     Other studies Reviewed: Additional studies/ records that were reviewed today include; Dr Lubertha Basque office notes  Assessment and Plan:  1.  Chronic systolic dysfunction Slightly volume overloaded on exam today Add HCTZ  daily (pt has this medication at home and has been on it before with good results) Stable on an appropriate medical regimen Normal CRTD function See Arita Miss Art report No changes today Repeat echo 11/2015 to assess response to CRT BMET today  2.  HTN Stable No change required today   Current medicines are reviewed at length with the patient today.   The patient does not have concerns regarding his medicines.  The following changes were made today:  none  Labs/ tests ordered today include: BMET, echo 11/2015    Disposition:   Follow up with Dr Ladona Ridgel in 6 weeks, Dr Daleen Squibb as scheduled   Signed, Gypsy Balsam, NP 06/30/2015 11:13 AM  [facility] [address] [place] [address] [phone] (office) [phone] (fax)

## 2015-07-09 ENCOUNTER — Telehealth: Payer: Self-pay | Admitting: *Deleted

## 2015-07-09 NOTE — Telephone Encounter (Signed)
-----   Message from Marily Lente, NP sent at 07/01/2015  7:01 AM EST ----- Please notify patient of stable labs. Ask him to weigh daily and call next week with weights and update on shortness of breath. Thanks!

## 2015-07-14 ENCOUNTER — Emergency Department (INDEPENDENT_AMBULATORY_CARE_PROVIDER_SITE_OTHER): Payer: No Typology Code available for payment source

## 2015-07-14 ENCOUNTER — Emergency Department (INDEPENDENT_AMBULATORY_CARE_PROVIDER_SITE_OTHER)
Admission: EM | Admit: 2015-07-14 | Discharge: 2015-07-14 | Disposition: A | Discharge status: Home / Self Care | Payer: No Typology Code available for payment source | Source: Home / Self Care

## 2015-07-14 ENCOUNTER — Encounter (HOSPITAL_COMMUNITY): Payer: Self-pay | Admitting: Emergency Medicine

## 2015-07-14 DIAGNOSIS — J40 Bronchitis, not specified as acute or chronic: Secondary | ICD-10-CM

## 2015-07-14 MED ORDER — AZITHROMYCIN 250 MG PO TABS
250.0000 mg | ORAL_TABLET | Freq: Every day | ORAL | Status: DC
Start: 2015-07-14 — End: 2015-09-18

## 2015-07-14 MED ORDER — IPRATROPIUM-ALBUTEROL 0.5-2.5 (3) MG/3ML IN SOLN
3.0000 mL | Freq: Once | RESPIRATORY_TRACT | Status: AC
  Administered 2015-07-14: 3 mL via RESPIRATORY_TRACT

## 2015-07-14 MED ORDER — IPRATROPIUM BROMIDE 0.02 % IN SOLN
RESPIRATORY_TRACT | Status: AC
  Filled 2015-07-14: qty 2.5

## 2015-07-14 MED ORDER — BENZONATATE 100 MG PO CAPS: 100.0000 mg | ORAL_CAPSULE | Freq: Three times a day (TID) | ORAL | Status: DC

## 2015-07-14 NOTE — ED Notes (Addendum)
Pt here with c/o constant dry cough that started intermit 3 weeks ago. States sx's worsened with unable to sleep and discomfort Denies fever,chills,n,v Not taking medication for cough Hx Defibrillator, denies chest pain or SOB

## 2015-07-14 NOTE — ED Provider Notes (Signed)
CSN: [id number]     Arrival date & time 07/14/15  1402 History   None    Chief Complaint  Patient presents with  . Cough   (Consider location/radiation/quality/duration/timing/severity/associated sxs/prior Treatment) HPI  History obtained from patient:   LOCATION:chest SEVERITY: DURATION:3 weeks CONTEXT:sudden onset cough QUALITY:dry non  productive MODIFYING FACTORS:sits in recliner chair ASSOCIATED SYMPTOMS: TIMING:constant but worse at night OCCUPATION:   Past Medical History  Diagnosis Date  . Hypertension   . Diabetes mellitus without complication (HCC)   . High cholesterol   . Kidney stone    Past Surgical History  Procedure Laterality Date  . Colonoscopy N/A 07/12/2013    Procedure: COLONOSCOPY;  Surgeon: Shirley Friar, MD;  Location: [facility];  Service: Endoscopy;  Laterality: N/A;  . Ep implantable device N/A 05/09/2015    Procedure: BiV ICD Insertion CRT-D;  Surgeon: Marinus Maw, MD;  Location: [facility] INVASIVE CV LAB;  Service: Cardiovascular;  Laterality: N/A;   Family History  Problem Relation Age of Onset  . Diabetes Mother   . Hypertension Mother   . Hyperlipidemia Mother   . Stroke Mother   . Diabetes Father   . Hypertension Father   . Stroke Father 52   Social History  Substance Use Topics  . Smoking status: Never Smoker   . Smokeless tobacco: None  . Alcohol Use: No    Review of Systems  Constitutional: Negative.   HENT: Negative.   Eyes: Negative.   Respiratory: Positive for cough.   Genitourinary: Negative.     Allergies  Review of patient's allergies indicates no known allergies.  Home Medications   Prior to Admission medications   Medication Sig Start Date End Date Taking? Authorizing Provider  aspirin EC 81 MG tablet Take 81 mg by mouth daily.    Historical Provider, MD  carvedilol (COREG) 25 MG tablet Take 1 tablet (25 mg total) by mouth 2 (two) times daily with a meal. 03/27/15   Quentin Angst, MD   hydrochlorothiazide (HYDRODIURIL) 25 MG tablet Take 25 mg by mouth daily.    Historical Provider, MD  insulin aspart (NOVOLOG FLEXPEN) 100 UNIT/ML FlexPen Inject 10 Units into the skin 2 (two) times daily with a meal.    Historical Provider, MD  insulin aspart (NOVOLOG) 100 UNIT/ML injection Inject 10 Units into the skin 2 (two) times daily with a meal. 03/27/15   Quentin Angst, MD  insulin glargine (LANTUS) 100 UNIT/ML injection Inject 0.3 mLs (30 Units total) into the skin at bedtime. 03/27/15   Quentin Angst, MD  insulin glargine (LANTUS) 100 unit/mL SOPN Inject 30 Units into the skin at bedtime.    Historical Provider, MD  metFORMIN (GLUCOPHAGE) 1000 MG tablet Take 1 tablet (1,000 mg total) by mouth 2 (two) times daily with a meal. 06/04/15   Olugbemiga E Hyman Hopes, MD  olmesartan (BENICAR) 20 MG tablet Take 1 tablet (20 mg total) by mouth daily. 04/03/15   Josalyn Funches, MD  pregabalin (LYRICA) 75 MG capsule Take 150 mg by mouth at bedtime.    Historical Provider, MD  rosuvastatin (CRESTOR) 20 MG tablet Take 20 mg by mouth daily.    Historical Provider, MD  traMADol (ULTRAM) 50 MG tablet Take 1 tablet (50 mg total) by mouth every 6 (six) hours as needed for moderate pain or severe pain. 05/10/15   Brittainy Sherlynn Carbon, PA-C   Meds Ordered and Administered this Visit  Medications - No data to display  BP 137/85 mmHg  Pulse 93  Temp(Src) 98.9 F (37.2 C) (Oral)  SpO2 95% No data found.   Physical Exam  Constitutional: He appears well-developed and well-nourished.  HENT:  Head: Normocephalic and atraumatic.  Right Ear: External ear normal.  Eyes: Conjunctivae are normal.  Neck: Normal range of motion. Neck supple.  Pulmonary/Chest: Effort normal. He has wheezes.  Neurological: He is alert.  Skin: Skin is warm and dry.  Psychiatric: He has a normal mood and affect. His behavior is normal. Judgment and thought content normal.  Nursing note and vitals reviewed.   ED Course   Procedures (including critical care time)  Labs Review Labs Reviewed - No data to display  Imaging Review Dg Chest 2 View  07/14/2015  CLINICAL DATA:  Cough, chest congestion, and flu symptoms for 3 weeks. EXAM: CHEST  2 VIEW COMPARISON:  05/19/2015 FINDINGS: Moderate cardiomegaly remains stable. AICD remains in appropriate position. Mild scarring in left midlung is unchanged. No evidence of acute infiltrate or pulmonary edema. No evidence of pleural effusion. IMPRESSION: Stable cardiomegaly.  No active lung disease. Electronically Signed   By: Myles Rosenthal M.D.   On: 07/14/2015 16:51     Visual Acuity Review  Right Eye Distance:   Left Eye Distance:   Bilateral Distance:    Right Eye Near:   Left Eye Near:    Bilateral Near:         MDM   1. Bronchitis    discussion with patient results of chest x-ray. Prescription for azithromycin and Tessalon Perles provided. Instructions of care provided discharged home in stable condition.  THIS NOTE WAS GENERATED USING A VOICE RECOGNITION SOFTWARE PROGRAM. ALL REASONABLE EFFORTS  WERE MADE TO PROOFREAD THIS DOCUMENT FOR ACCURACY.     Tharon Aquas, PA 07/14/15 1755

## 2015-07-14 NOTE — Discharge Instructions (Signed)
Upper Respiratory Infection, Adult °Most upper respiratory infections (URIs) are caused by a virus. A URI affects the nose, throat, and upper air passages. The most common type of URI is often called "the common cold." °HOME CARE  °· Take medicines only as told by your doctor. °· Gargle warm saltwater or take cough drops to comfort your throat as told by your doctor. °· Use a warm mist humidifier or inhale steam from a shower to increase air moisture. This may make it easier to breathe. °· Drink enough fluid to keep your pee (urine) clear or pale yellow. °· Eat soups and other clear broths. °· Have a healthy diet. °· Rest as needed. °· Go back to work when your fever is gone or your doctor says it is okay. °¨ You may need to stay home longer to avoid giving your URI to others. °¨ You can also wear a face mask and wash your hands often to prevent spread of the virus. °· Use your inhaler more if you have asthma. °· Do not use any tobacco products, including cigarettes, chewing tobacco, or electronic cigarettes. If you need help quitting, ask your doctor. °GET HELP IF: °· You are getting worse, not better. °· Your symptoms are not helped by medicine. °· You have chills. °· You are getting more short of breath. °· You have brown or red mucus. °· You have yellow or brown discharge from your nose. °· You have pain in your face, especially when you bend forward. °· You have a fever. °· You have puffy (swollen) neck glands. °· You have pain while swallowing. °· You have white areas in the back of your throat. °GET HELP RIGHT AWAY IF:  °· You have very bad or constant: °· Headache. °· Ear pain. °· Pain in your forehead, behind your eyes, and over your cheekbones (sinus pain). °· Chest pain. °· You have long-lasting (chronic) lung disease and any of the following: °· Wheezing. °· Long-lasting cough. °· Coughing up blood. °· A change in your usual mucus. °· You have a stiff neck. °· You have changes in  your: °· Vision. °· Hearing. °· Thinking. °· Mood. °MAKE SURE YOU:  °· Understand these instructions. °· Will watch your condition. °· Will get help right away if you are not doing well or get worse. °  °This information is not intended to replace advice given to you by your health care provider. Make sure you discuss any questions you have with your health care provider. °  °Document Released: 01/05/2008 Document Revised: 12/03/2014 Document Reviewed: 10/24/2013 °Elsevier Interactive Patient Education ©2016 [employer] °Acute Bronchitis °Bronchitis is when the airways that extend from the windpipe into the lungs get red, puffy, and painful (inflamed). Bronchitis often causes thick spit (mucus) to develop. This leads to a cough. A cough is the most common symptom of bronchitis. °In acute bronchitis, the condition usually begins suddenly and goes away over time (usually in 2 weeks). Smoking, allergies, and asthma can make bronchitis worse. Repeated episodes of bronchitis may cause more lung problems. °HOME CARE °· Rest. °· Drink enough fluids to keep your pee (urine) clear or pale yellow (unless you need to limit fluids as told by your doctor). °· Only take over-the-counter or prescription medicines as told by your doctor. °· Avoid smoking and secondhand smoke. These can make bronchitis worse. If you are a smoker, think about using nicotine gum or skin patches. Quitting smoking will help your lungs heal faster. °· Reduce the   chance of getting bronchitis again by: °¨ Washing your hands often. °¨ Avoiding people with cold symptoms. °¨ Trying not to touch your hands to your mouth, nose, or eyes. °· Follow up with your doctor as told. °GET HELP IF: °Your symptoms do not improve after 1 week of treatment. Symptoms include: °· Cough. °· Fever. °· Coughing up thick spit. °· Body aches. °· Chest congestion. °· Chills. °· Shortness of breath. °· Sore throat. °GET HELP RIGHT AWAY IF:  °· You have an increased fever. °· You  have chills. °· You have severe shortness of breath. °· You have bloody thick spit (sputum). °· You throw up (vomit) often. °· You lose too much body fluid (dehydration). °· You have a severe headache. °· You faint. °MAKE SURE YOU:  °· Understand these instructions. °· Will watch your condition. °· Will get help right away if you are not doing well or get worse. °  °This information is not intended to replace advice given to you by your health care provider. Make sure you discuss any questions you have with your health care provider. °  °Document Released: 01/05/2008 Document Revised: 03/21/2013 Document Reviewed: 01/09/2013 °Elsevier Interactive Patient Education ©2016 Elsevier Inc. ° °

## 2015-07-23 ENCOUNTER — Telehealth: Payer: Self-pay | Admitting: Internal Medicine

## 2015-07-23 NOTE — Telephone Encounter (Signed)
Received fax with this statement: Note from Feb 2016 states pt was A.d to Atorvastatin - pt states he has only been taking crestor. (note date last filled) - requested we fill crestor - pls advise-

## 2015-08-01 ENCOUNTER — Encounter: Payer: Self-pay | Admitting: Internal Medicine

## 2015-08-12 ENCOUNTER — Encounter: Payer: No Typology Code available for payment source | Admitting: Internal Medicine

## 2015-08-12 ENCOUNTER — Ambulatory Visit: Payer: No Typology Code available for payment source | Admitting: Internal Medicine

## 2015-08-22 ENCOUNTER — Other Ambulatory Visit: Payer: Self-pay | Admitting: Internal Medicine

## 2015-08-22 MED ORDER — ROSUVASTATIN CALCIUM 20 MG PO TABS: 20.0000 mg | ORAL_TABLET | Freq: Every day | ORAL | Status: DC

## 2015-09-18 ENCOUNTER — Encounter: Payer: Self-pay | Admitting: Internal Medicine

## 2015-09-18 ENCOUNTER — Ambulatory Visit (INDEPENDENT_AMBULATORY_CARE_PROVIDER_SITE_OTHER): Payer: No Typology Code available for payment source | Admitting: Internal Medicine

## 2015-09-18 VITALS — BP 142/76 | HR 73 | Ht 67.0 in | Wt 244.2 lb

## 2015-09-18 DIAGNOSIS — Z95810 Presence of automatic (implantable) cardiac defibrillator: Secondary | ICD-10-CM

## 2015-09-18 DIAGNOSIS — I5022 Chronic systolic (congestive) heart failure: Secondary | ICD-10-CM

## 2015-09-18 LAB — CUP PACEART INCLINIC DEVICE CHECK
Brady Statistic RV Percent Paced: 96 %
HighPow Impedance: 61.875
Implantable Lead Implant Date: 20161007
Implantable Lead Implant Date: 20161007
Implantable Lead Implant Date: 20161007
Implantable Lead Location: 753860
Implantable Lead Model: 7122
Lead Channel Impedance Value: 537.5 Ohm
Lead Channel Impedance Value: 775 Ohm
Lead Channel Pacing Threshold Amplitude: 0.75 V
Lead Channel Pacing Threshold Amplitude: 1.5 V
Lead Channel Pacing Threshold Pulse Width: 0.5 ms
Lead Channel Pacing Threshold Pulse Width: 0.5 ms
Lead Channel Pacing Threshold Pulse Width: 0.5 ms
Lead Channel Pacing Threshold Pulse Width: 0.5 ms
Lead Channel Setting Pacing Amplitude: 2 V
Lead Channel Setting Pacing Amplitude: 2 V
Lead Channel Setting Pacing Amplitude: 2.5 V
Lead Channel Setting Pacing Pulse Width: 0.5 ms
MDC IDC LEAD LOCATION: 753858
MDC IDC LEAD LOCATION: 753859
MDC IDC MSMT BATTERY REMAINING LONGEVITY: 75.6
MDC IDC MSMT LEADCHNL LV PACING THRESHOLD AMPLITUDE: 0.75 V
MDC IDC MSMT LEADCHNL LV PACING THRESHOLD AMPLITUDE: 1.5 V
MDC IDC MSMT LEADCHNL LV PACING THRESHOLD PULSEWIDTH: 0.5 ms
MDC IDC MSMT LEADCHNL LV PACING THRESHOLD PULSEWIDTH: 0.5 ms
MDC IDC MSMT LEADCHNL RA IMPEDANCE VALUE: 437.5 Ohm
MDC IDC MSMT LEADCHNL RA PACING THRESHOLD AMPLITUDE: 0.75 V
MDC IDC MSMT LEADCHNL RA SENSING INTR AMPL: 4.6 mV
MDC IDC MSMT LEADCHNL RV PACING THRESHOLD AMPLITUDE: 0.75 V
MDC IDC MSMT LEADCHNL RV PACING THRESHOLD AMPLITUDE: 0.75 V
MDC IDC MSMT LEADCHNL RV PACING THRESHOLD PULSEWIDTH: 0.5 ms
MDC IDC MSMT LEADCHNL RV SENSING INTR AMPL: 12 mV
MDC IDC PG SERIAL: 7294919
MDC IDC SESS DTM: 20170216132137
MDC IDC SET LEADCHNL LV PACING PULSEWIDTH: 0.5 ms
MDC IDC SET LEADCHNL RV SENSING SENSITIVITY: 0.5 mV
MDC IDC STAT BRADY RA PERCENT PACED: 3.2 %

## 2015-09-18 MED ORDER — FUROSEMIDE 20 MG PO TABS: 20.0000 mg | ORAL_TABLET | Freq: Every day | ORAL | Status: DC

## 2015-09-18 MED FILL — ?FUROSEMIDE 20 MG TABLET: 20 | 30 days supply | Qty: 30 | Fill #0

## 2015-09-18 NOTE — Patient Instructions (Signed)
Medication Instructions:  Your physician has recommended you make the following change in your medication:  1) Stop HCTZ 2) Start Furosemide 20 mg daily    Labwork: Your physician recommends that you return for lab work in 1 week: BMP    Testing/Procedures: None ordered   Follow-Up:   Your physician recommends that you schedule a follow-up appointment in: 8 weeks with Dr Ladona Ridgel  Remote monitoring is used to monitor your ICD from home. This monitoring reduces the number of office visits required to check your device to one time per year. It allows [place] to keep an eye on the functioning of your device to ensure it is working properly. You are scheduled for a device check from home on 12/18/15. You may send your transmission at any time that day. If you have a wireless device, the transmission will be sent automatically. After your physician reviews your transmission, you will receive a postcard with your next transmission date.       Any Other Special Instructions Will Be Listed Below (If Applicable).     If you need a refill on your cardiac medications before your next appointment, please call your pharmacy.

## 2015-09-18 NOTE — Progress Notes (Signed)
HPI Benjamin [name] returns for followup. He is a pleasant 55 yo man with chronic systolic heart failure and LBBB, s/p BiV ICD implant. In the interim, he has had trouble with sob. He admits to both dietary and medical non-compliance. [place] sob [place]. No ICD shocks. He is not on lasix and has asked about Entresto. No Known Allergies   Current Outpatient Prescriptions  Medication Sig Dispense Refill  . aspirin EC 81 MG tablet Take 81 mg by mouth daily.    . benzonatate (TESSALON) 100 MG capsule Take 1 capsule (100 mg total) by mouth every 8 (eight) hours. 21 capsule 0  . carvedilol (COREG) 25 MG tablet Take 1 tablet (25 mg total) by mouth 2 (two) times daily with a meal. 180 tablet 3  . insulin aspart (NOVOLOG FLEXPEN) 100 UNIT/ML FlexPen Inject 10 Units into the skin 2 (two) times daily with a meal.    . insulin glargine (LANTUS) 100 UNIT/ML injection Inject 0.3 mLs (30 Units total) into the skin at bedtime. 3 vial 3  . insulin glargine (LANTUS) 100 unit/mL SOPN Inject 30 Units into the skin at bedtime.    . metFORMIN (GLUCOPHAGE) 1000 MG tablet Take 1 tablet (1,000 mg total) by mouth 2 (two) times daily with a meal. 180 tablet 3  . olmesartan (BENICAR) 20 MG tablet Take 1 tablet (20 mg total) by mouth daily. 30 tablet 11  . pregabalin (LYRICA) 75 MG capsule Take 150 mg by mouth at bedtime.    . rosuvastatin (CRESTOR) 20 MG tablet Take 1 tablet (20 mg total) by mouth daily. 90 tablet 3  . furosemide (LASIX) 20 MG tablet Take 1 tablet (20 mg total) by mouth daily. 90 tablet 3   No current facility-administered medications for this visit.     Past Medical History  Diagnosis Date  . Hypertension   . Diabetes mellitus without complication (HCC)   . High cholesterol   . Kidney stone     ROS:   All systems reviewed and negative except as noted in the HPI.   Past Surgical History  Procedure Laterality Date  . Colonoscopy N/A 07/12/2013    Procedure: COLONOSCOPY;  Surgeon: Shirley Friar, MD;  Location: [facility];  Service: Endoscopy;  Laterality: N/A;  . Ep implantable device N/A 05/09/2015    Procedure: BiV ICD Insertion CRT-D;  Surgeon: Marinus Maw, MD;  Location[facility] INVASIVE CV LAB;  Service: Cardiovascular;  Laterality: N/A;     Family History  Problem Relation Age of Onset  . Diabetes Mother   . Hypertension Mother   . Hyperlipidemia Mother   . Stroke Mother   . Diabetes Father   . Hypertension Father   . Stroke Father 57     Social History   Social History  . Marital Status: Married    Spouse Name: N/A  . Number of Children: N/A  . Years of Education: N/A   Occupational History  . Not on file.   Social History Main Topics  . Smoking status: Never Smoker   . Smokeless tobacco: Not on file  . Alcohol Use: No  . Drug Use: No  . Sexual Activity: Not on file   Other Topics Concern  . Not on file   Social History Narrative     BP 142/76 mmHg  Pulse 73  Ht  (1.702 m)  Wt 244 lb 3.2 oz (110.768 kg)  BMI  38.24 kg/m2  Physical Exam:  Well appearing 55 yo man, obese, NAD HEENT: Unremarkable Neck:  6 cm JVD, no thyromegally Lymphatics:  No adenopathy Back:  No CVA tenderness Lungs:  Clear with no wheezes HEART:  Regular rate rhythm, no murmurs, no rubs, no clicks Abd:  soft, positive bowel sounds, no organomegally, no rebound, no guarding Ext:  2 plus pulses, no edema, no cyanosis, no clubbing Skin:  No rashes no nodules Neuro:  CN II through XII intact, motor grossly intact  EKG - nsr with LBBB/IVCD ICD check - normal device (St.Jude BiV ICD) function.  Assess/Plan: 1. Chronic systolic heart failure - he admits to non-compliance and I have stessed the importance. He will start lasix and stop HCTZ. I will see him back in 2 months and we will consider adding Entresto. 2. ICD - his St. Jude device is working normally with BiV pacing from multiple sites. Will recheck in  several months.  3. Obesity - he admits to dietary indiscretion. Will follow.  Leonia Reeves.D.

## 2015-09-26 ENCOUNTER — Other Ambulatory Visit (INDEPENDENT_AMBULATORY_CARE_PROVIDER_SITE_OTHER): Payer: No Typology Code available for payment source | Admitting: *Deleted

## 2015-09-26 DIAGNOSIS — I5022 Chronic systolic (congestive) heart failure: Secondary | ICD-10-CM

## 2015-09-26 LAB — BASIC METABOLIC PANEL
BUN: 21 mg/dL (ref 7–25)
CALCIUM: 9.2 mg/dL (ref 8.6–10.3)
CHLORIDE: 104 mmol/L (ref 98–110)
CO2: 25 mmol/L (ref 20–31)
CREATININE: 1.01 mg/dL (ref 0.70–1.33)
Glucose, Bld: 66 mg/dL (ref 65–99)
Potassium: 4.8 mmol/L (ref 3.5–5.3)
Sodium: 141 mmol/L (ref 135–146)

## 2015-09-26 NOTE — Addendum Note (Signed)
Addended by: Tonita Phoenix on: 09/26/2015 11:44 AM   Modules accepted: Orders

## 2015-10-06 ENCOUNTER — Emergency Department (HOSPITAL_BASED_OUTPATIENT_CLINIC_OR_DEPARTMENT_OTHER): Payer: Self-pay

## 2015-10-06 ENCOUNTER — Inpatient Hospital Stay (HOSPITAL_BASED_OUTPATIENT_CLINIC_OR_DEPARTMENT_OTHER)
Admission: EM | Admit: 2015-10-06 | Discharge: 2015-10-11 | DRG: 417 | Disposition: A | Discharge status: Home / Self Care | Payer: Medicaid Other | Attending: Internal Medicine | Admitting: Internal Medicine

## 2015-10-06 ENCOUNTER — Inpatient Hospital Stay (HOSPITAL_BASED_OUTPATIENT_CLINIC_OR_DEPARTMENT_OTHER): Payer: Self-pay

## 2015-10-06 ENCOUNTER — Encounter (HOSPITAL_BASED_OUTPATIENT_CLINIC_OR_DEPARTMENT_OTHER): Payer: Self-pay | Admitting: Emergency Medicine

## 2015-10-06 DIAGNOSIS — R1011 Right upper quadrant pain: Secondary | ICD-10-CM

## 2015-10-06 DIAGNOSIS — Z794 Long term (current) use of insulin: Secondary | ICD-10-CM

## 2015-10-06 DIAGNOSIS — Z01810 Encounter for preprocedural cardiovascular examination: Secondary | ICD-10-CM

## 2015-10-06 DIAGNOSIS — I5023 Acute on chronic systolic (congestive) heart failure: Secondary | ICD-10-CM | POA: Diagnosis present

## 2015-10-06 DIAGNOSIS — I5022 Chronic systolic (congestive) heart failure: Secondary | ICD-10-CM

## 2015-10-06 DIAGNOSIS — Z833 Family history of diabetes mellitus: Secondary | ICD-10-CM

## 2015-10-06 DIAGNOSIS — I10 Essential (primary) hypertension: Secondary | ICD-10-CM

## 2015-10-06 DIAGNOSIS — N179 Acute kidney failure, unspecified: Secondary | ICD-10-CM | POA: Diagnosis not present

## 2015-10-06 DIAGNOSIS — K819 Cholecystitis, unspecified: Secondary | ICD-10-CM | POA: Diagnosis not present

## 2015-10-06 DIAGNOSIS — I2510 Atherosclerotic heart disease of native coronary artery without angina pectoris: Secondary | ICD-10-CM | POA: Diagnosis present

## 2015-10-06 DIAGNOSIS — E7800 Pure hypercholesterolemia, unspecified: Secondary | ICD-10-CM | POA: Diagnosis present

## 2015-10-06 DIAGNOSIS — E785 Hyperlipidemia, unspecified: Secondary | ICD-10-CM | POA: Diagnosis present

## 2015-10-06 DIAGNOSIS — I429 Cardiomyopathy, unspecified: Secondary | ICD-10-CM

## 2015-10-06 DIAGNOSIS — Z7982 Long term (current) use of aspirin: Secondary | ICD-10-CM

## 2015-10-06 DIAGNOSIS — Z8249 Family history of ischemic heart disease and other diseases of the circulatory system: Secondary | ICD-10-CM

## 2015-10-06 DIAGNOSIS — G629 Polyneuropathy, unspecified: Secondary | ICD-10-CM

## 2015-10-06 DIAGNOSIS — E1140 Type 2 diabetes mellitus with diabetic neuropathy, unspecified: Secondary | ICD-10-CM | POA: Diagnosis present

## 2015-10-06 DIAGNOSIS — Z95810 Presence of automatic (implantable) cardiac defibrillator: Secondary | ICD-10-CM

## 2015-10-06 DIAGNOSIS — Z79899 Other long term (current) drug therapy: Secondary | ICD-10-CM

## 2015-10-06 DIAGNOSIS — K8000 Calculus of gallbladder with acute cholecystitis without obstruction: Principal | ICD-10-CM | POA: Diagnosis present

## 2015-10-06 DIAGNOSIS — E1110 Type 2 diabetes mellitus with ketoacidosis without coma: Secondary | ICD-10-CM | POA: Diagnosis present

## 2015-10-06 DIAGNOSIS — K219 Gastro-esophageal reflux disease without esophagitis: Secondary | ICD-10-CM | POA: Diagnosis present

## 2015-10-06 DIAGNOSIS — Z01818 Encounter for other preprocedural examination: Secondary | ICD-10-CM

## 2015-10-06 HISTORY — DX: Atherosclerotic heart disease of native coronary artery without angina pectoris: I25.10

## 2015-10-06 HISTORY — DX: Gastro-esophageal reflux disease without esophagitis: K21.9

## 2015-10-06 HISTORY — DX: Calculus of gallbladder without cholecystitis without obstruction: K80.20

## 2015-10-06 HISTORY — DX: Chronic systolic (congestive) heart failure: I50.22

## 2015-10-06 HISTORY — DX: Type 2 diabetes mellitus without complications: E11.9

## 2015-10-06 HISTORY — DX: Presence of automatic (implantable) cardiac defibrillator: Z95.810

## 2015-10-06 HISTORY — DX: Pneumonia, unspecified organism: J18.9

## 2015-10-06 LAB — PHOSPHORUS: PHOSPHORUS: 4.7 mg/dL — AB (ref 2.5–4.6)

## 2015-10-06 LAB — CBC WITH DIFFERENTIAL/PLATELET
BASOS PCT: 0 %
Basophils Absolute: 0 10*3/uL (ref 0.0–0.1)
EOS PCT: 3 %
Eosinophils Absolute: 0.3 10*3/uL (ref 0.0–0.7)
HEMATOCRIT: 46.4 % (ref 39.0–52.0)
HEMOGLOBIN: 15 g/dL (ref 13.0–17.0)
LYMPHS ABS: 0.8 10*3/uL (ref 0.7–4.0)
Lymphocytes Relative: 9 %
MCH: 24.6 pg — ABNORMAL LOW (ref 26.0–34.0)
MCHC: 32.3 g/dL (ref 30.0–36.0)
MCV: 76.1 fL — AB (ref 78.0–100.0)
MONO ABS: 0.5 10*3/uL (ref 0.1–1.0)
Monocytes Relative: 6 %
NEUTROS PCT: 82 %
Neutro Abs: 6.8 10*3/uL (ref 1.7–7.7)
Platelets: 175 10*3/uL (ref 150–400)
RBC: 6.1 MIL/uL — ABNORMAL HIGH (ref 4.22–5.81)
RDW: 20.1 % — ABNORMAL HIGH (ref 11.5–15.5)
WBC: 8.4 10*3/uL (ref 4.0–10.5)

## 2015-10-06 LAB — COMPREHENSIVE METABOLIC PANEL
ALBUMIN: 4 g/dL (ref 3.5–5.0)
ALK PHOS: 53 U/L (ref 38–126)
ALT: 22 U/L (ref 17–63)
AST: 23 U/L (ref 15–41)
Anion gap: 12 (ref 5–15)
BUN: 28 mg/dL — ABNORMAL HIGH (ref 6–20)
CALCIUM: 9.3 mg/dL (ref 8.9–10.3)
CO2: 25 mmol/L (ref 22–32)
CREATININE: 1.08 mg/dL (ref 0.61–1.24)
Chloride: 104 mmol/L (ref 101–111)
GFR calc Af Amer: >60 mL/min (ref >60)
GFR calc non Af Amer: >60 mL/min (ref >60)
GLUCOSE: 173 mg/dL — AB (ref 65–99)
Potassium: 5 mmol/L (ref 3.5–5.1)
SODIUM: 141 mmol/L (ref 135–145)
Total Bilirubin: 1.2 mg/dL (ref 0.3–1.2)
Total Protein: 6.8 g/dL (ref 6.5–8.1)

## 2015-10-06 LAB — URINALYSIS, ROUTINE W REFLEX MICROSCOPIC
BILIRUBIN URINE: NEGATIVE
Glucose, UA: NEGATIVE mg/dL
KETONES UR: 15 mg/dL — AB
LEUKOCYTES UA: NEGATIVE
NITRITE: NEGATIVE
PH: 6 (ref 5.0–8.0)
Specific Gravity, Urine: 1.029 (ref 1.005–1.030)

## 2015-10-06 LAB — GLUCOSE, CAPILLARY
GLUCOSE-CAPILLARY: 163 mg/dL — AB (ref 65–99)
GLUCOSE-CAPILLARY: 143 mg/dL — AB (ref 65–99)

## 2015-10-06 LAB — MAGNESIUM: MAGNESIUM: 1.7 mg/dL (ref 1.7–2.4)

## 2015-10-06 LAB — URINE MICROSCOPIC-ADD ON

## 2015-10-06 LAB — CBG MONITORING, ED: GLUCOSE-CAPILLARY: 170 mg/dL — AB (ref 65–99)

## 2015-10-06 LAB — BRAIN NATRIURETIC PEPTIDE: B NATRIURETIC PEPTIDE 5: 1374.4 pg/mL — AB (ref 0.0–100.0)

## 2015-10-06 LAB — LIPASE, BLOOD: Lipase: 22 U/L (ref 11–51)

## 2015-10-06 MED ORDER — FENTANYL CITRATE (PF) 100 MCG/2ML IJ SOLN
100.0000 ug | Freq: Once | INTRAMUSCULAR | Status: AC
  Administered 2015-10-06: 100 ug via INTRAVENOUS
  Filled 2015-10-06: qty 2

## 2015-10-06 MED ORDER — ASPIRIN EC 81 MG PO TBEC
81.0000 mg | DELAYED_RELEASE_TABLET | Freq: Every day | ORAL | Status: DC
Start: 2015-10-06 — End: 2015-10-07
  Administered 2015-10-06: 81 mg via ORAL
  Filled 2015-10-06: qty 1

## 2015-10-06 MED ORDER — SODIUM CHLORIDE 0.9 % IV SOLN
INTRAVENOUS | Status: DC
  Administered 2015-10-06 (×2): via INTRAVENOUS

## 2015-10-06 MED ORDER — FENTANYL CITRATE (PF) 100 MCG/2ML IJ SOLN
50.0000 ug | Freq: Once | INTRAMUSCULAR | Status: AC
  Administered 2015-10-06: 50 ug via INTRAVENOUS
  Filled 2015-10-06: qty 2

## 2015-10-06 MED ORDER — ONDANSETRON HCL 4 MG PO TABS
4.0000 mg | ORAL_TABLET | Freq: Four times a day (QID) | ORAL | Status: DC | PRN
Start: 2015-10-06 — End: 2015-10-09

## 2015-10-06 MED ORDER — FUROSEMIDE 10 MG/ML IJ SOLN
40.0000 mg | Freq: Once | INTRAMUSCULAR | Status: AC
  Administered 2015-10-06: 40 mg via INTRAVENOUS
  Filled 2015-10-06: qty 4

## 2015-10-06 MED ORDER — ONDANSETRON HCL 4 MG/2ML IJ SOLN: 4.0000 mg | Freq: Four times a day (QID) | INTRAMUSCULAR | Status: DC | PRN

## 2015-10-06 MED ORDER — ONDANSETRON HCL 4 MG/2ML IJ SOLN
4.0000 mg | Freq: Once | INTRAMUSCULAR | Status: AC
  Administered 2015-10-06: 4 mg via INTRAVENOUS
  Filled 2015-10-06: qty 2

## 2015-10-06 MED ORDER — MORPHINE SULFATE (PF) 2 MG/ML IV SOLN
2.0000 mg | INTRAVENOUS | Status: DC | PRN
  Administered 2015-10-07 – 2015-10-09 (×3): 2 mg via INTRAVENOUS
  Administered 2015-10-09: 4 mg via INTRAVENOUS
  Administered 2015-10-10: 2 mg via INTRAVENOUS
  Filled 2015-10-06: qty 1
  Filled 2015-10-06: qty 2
  Filled 2015-10-06 (×3): qty 1

## 2015-10-06 MED ORDER — SODIUM CHLORIDE 0.9% FLUSH
3.0000 mL | Freq: Two times a day (BID) | INTRAVENOUS | Status: DC
  Administered 2015-10-07 – 2015-10-11 (×4): 3 mL via INTRAVENOUS

## 2015-10-06 MED ORDER — PREGABALIN 50 MG PO CAPS
150.0000 mg | ORAL_CAPSULE | Freq: Every day | ORAL | Status: DC
  Administered 2015-10-06 – 2015-10-10 (×5): 150 mg via ORAL
  Filled 2015-10-06 (×3): qty 3
  Filled 2015-10-06: qty 2
  Filled 2015-10-06: qty 3

## 2015-10-06 MED ORDER — INSULIN ASPART 100 UNIT/ML ~~LOC~~ SOLN
0.0000 [IU] | Freq: Every day | SUBCUTANEOUS | Status: DC
  Administered 2015-10-07: 3 [IU] via SUBCUTANEOUS

## 2015-10-06 MED ORDER — CARVEDILOL 12.5 MG PO TABS
25.0000 mg | ORAL_TABLET | Freq: Two times a day (BID) | ORAL | Status: DC
  Administered 2015-10-06: 25 mg via ORAL
  Filled 2015-10-06: qty 1

## 2015-10-06 MED ORDER — DEXTROSE 5 % IV SOLN
2.0000 g | Freq: Once | INTRAVENOUS | Status: AC
  Administered 2015-10-06: 2 g via INTRAVENOUS
  Filled 2015-10-06: qty 2

## 2015-10-06 MED ORDER — IRBESARTAN 75 MG PO TABS
150.0000 mg | ORAL_TABLET | Freq: Every day | ORAL | Status: DC
  Administered 2015-10-06: 150 mg via ORAL
  Filled 2015-10-06: qty 1

## 2015-10-06 MED ORDER — FUROSEMIDE 20 MG PO TABS: 20.0000 mg | ORAL_TABLET | Freq: Every day | ORAL | Status: DC

## 2015-10-06 MED ORDER — ONDANSETRON HCL 4 MG/2ML IJ SOLN
INTRAMUSCULAR | Status: AC
  Filled 2015-10-06: qty 2

## 2015-10-06 MED ORDER — SODIUM CHLORIDE 0.9 % IV SOLN
INTRAVENOUS | Status: DC
  Administered 2015-10-06: 07:00:00 via INTRAVENOUS

## 2015-10-06 MED ORDER — ACETAMINOPHEN 325 MG PO TABS: 650.0000 mg | ORAL_TABLET | Freq: Four times a day (QID) | ORAL | Status: DC | PRN

## 2015-10-06 MED ORDER — INSULIN ASPART 100 UNIT/ML ~~LOC~~ SOLN
0.0000 [IU] | Freq: Three times a day (TID) | SUBCUTANEOUS | Status: DC
  Administered 2015-10-06: 4 [IU] via SUBCUTANEOUS
  Administered 2015-10-07 (×2): 7 [IU] via SUBCUTANEOUS
  Administered 2015-10-08: 3 [IU] via SUBCUTANEOUS
  Administered 2015-10-08: 4 [IU] via SUBCUTANEOUS
  Administered 2015-10-08: 3 [IU] via SUBCUTANEOUS
  Administered 2015-10-10 (×3): 4 [IU] via SUBCUTANEOUS
  Administered 2015-10-11: 3 [IU] via SUBCUTANEOUS

## 2015-10-06 MED ORDER — ACETAMINOPHEN 650 MG RE SUPP: 650.0000 mg | Freq: Four times a day (QID) | RECTAL | Status: DC | PRN

## 2015-10-06 NOTE — ED Notes (Addendum)
EDP present on arrival/ during triage, pt here from home with wife, here for RUQ "gall bladder pain", h/o same, onset 5-6 hrs ago, also nausea. (denies: CP, sob, vd, fever, dizziness or other sx), describes pain as "hard pain", no relief with oxycodone, nor tramadol PTA, has not seen a surgeon for same, pt has cardiac hx, pacemaker present, pt of Dr. Rosette Reveal (EP/card). Pt alert, NAD, calm, mildly restless, grimacing, unable to find position of comfort, no dyspnea noted, skin W&D.

## 2015-10-06 NOTE — ED Notes (Signed)
Pt states "My mouth is dry, I need something to drink..." explained npo status to pt and his wife, both verbalize understanding, cool wet washcloth provided to wet his lips.

## 2015-10-06 NOTE — Progress Notes (Signed)
Pending admission  Patient seen at [facility] [place] and diagnosed with acute cholecystitis. EDP, Dr Manus Gunning, consulted Gen. Surgery, has agreed to see the patient upon arrival. Of note patient with significant heart failure with an EF of 20% and AICD in place. Cardiology consult will need to be placed after arrival for cardiac clearance for surgery. Patient currently nothing by mouth, AF VSS. Greatly appreciate emergency room workup and coordination of care prior to patient's transfer. Patient has been accepted [facility] on telemetry.  Shelly Flatten, MD [facility] 10/06/2015, 10:13 AM

## 2015-10-06 NOTE — ED Notes (Signed)
Dr. Molpus at BS with US 

## 2015-10-06 NOTE — ED Notes (Signed)
Pt returns from [place], states that his pain is returning. Dr. Tollie Pizza informed.

## 2015-10-06 NOTE — Progress Notes (Signed)
Benjamin [name] [id number] Admission Data: 10/06/2015 5:21 PM Attending Provider: No att. providers found  AJG:OTLXBW, Keane Scrape, MD Consults/ Treatment Team: Treatment Team:  Rounding Lbcardiology, MD Md Ccs, MD  Benjamin [name] is a 55 y.o. male patient admitted from ED awake, alert  & orientated  X 3,  Full Code, VSS - Blood pressure 133/79, pulse 78, temperature 99.3 F (37.4 C), temperature source Oral, resp. rate 18, height 5\' 7"  (1.702 m), weight 107.956 kg (238 lb), SpO2 95 %., O2    2 L nasal cannular, no c/o shortness of breath, no c/o chest pain, no distress noted. Tele # [id number] placed and pt is currently running:normal sinus rhythm.   IV site WDL:  antecubital left, condition no redness with a transparent dsg that's clean dry and intact.  Allergies:  No Known Allergies   Past Medical History  Diagnosis Date  . Hypertension   . Diabetes mellitus without complication (HCC)   . High cholesterol   . Kidney stone   . Cholelithiasis   . Chronic systolic heart failure (HCC)     History:  obtained from chart review. Tobacco/alcohol: denied none  Pt orientation to unit, room and routine. Information packet given to patient/family and safety video watched.  Admission INP armband ID verified with patient/family, and in place. SR up x 2, fall risk assessment complete with Patient and family verbalizing understanding of risks associated with falls. Pt verbalizes an understanding of how to use the call bell and to call for help before getting out of bed.  Skin, clean-dry- intact without evidence of bruising, or skin tears.   No evidence of skin break down noted on exam. color normal, vascularity normal, no rashes or suspicious lesions, no evidence of bleeding or bruising, no lesions noted, no rash, temperature normal, texture normal, mobility and turgor normal    Will cont to monitor and assist as needed.  Benjamin Flaming, RN 10/06/2015 5:21 PM

## 2015-10-06 NOTE — ED Provider Notes (Signed)
CSN: [id number]     Arrival date & time 10/06/15  0611 History   None    Chief Complaint  Patient presents with  . Abdominal Pain     (Consider location/radiation/quality/duration/timing/severity/associated sxs/prior Treatment) HPI  This is a 55 year old male with a history of nephrolithiasis and cholelithiasis. He has had episodes of biliary colic for about the last year. He is here with 5 hour history of pain in the epigastrium and right upper quadrant of the abdomen. He says the pain is a "hard pain" and like previous episodes of biliary colic. He has taken oxycodone and tramadol without relief. Pain is somewhat worse with movement. He has had nausea but no vomiting or diarrhea. He is afebrile.   Past Medical History  Diagnosis Date  . Hypertension   . Diabetes mellitus without complication (HCC)   . High cholesterol   . Kidney stone   . Cholelithiasis   . Chronic systolic heart failure [facility])    Past Surgical History  Procedure Laterality Date  . Colonoscopy N/A 07/12/2013    Procedure: COLONOSCOPY;  Surgeon: Shirley Friar, MD;  Location: [facility];  Service: Endoscopy;  Laterality: N/A;  . Ep implantable device N/A 05/09/2015    Procedure: BiV ICD Insertion CRT-D;  Surgeon: Marinus Maw, MD;  Location: [facility] INVASIVE CV LAB;  Service: Cardiovascular;  Laterality: N/A;   Family History  Problem Relation Age of Onset  . Diabetes Mother   . Hypertension Mother   . Hyperlipidemia Mother   . Stroke Mother   . Diabetes Father   . Hypertension Father   . Stroke Father 37   Social History  Substance Use Topics  . Smoking status: Never Smoker   . Smokeless tobacco: None  . Alcohol Use: No    Review of Systems  All other systems reviewed and are negative.   Allergies  Review of patient's allergies indicates no known allergies.  Home Medications   Prior to Admission medications   Medication Sig Start Date End Date Taking? Authorizing Provider  aspirin EC 81 MG  tablet Take 81 mg by mouth daily.    Historical Provider, MD  benzonatate (TESSALON) 100 MG capsule Take 1 capsule (100 mg total) by mouth every 8 (eight) hours. 07/14/15   Tharon Aquas, PA  carvedilol (COREG) 25 MG tablet Take 1 tablet (25 mg total) by mouth 2 (two) times daily with a meal. 03/27/15   Quentin Angst, MD  furosemide (LASIX) 20 MG tablet Take 1 tablet (20 mg total) by mouth daily. 09/18/15   Marinus Maw, MD  insulin aspart (NOVOLOG FLEXPEN) 100 UNIT/ML FlexPen Inject 10 Units into the skin 2 (two) times daily with a meal.    Historical Provider, MD  insulin glargine (LANTUS) 100 UNIT/ML injection Inject 0.3 mLs (30 Units total) into the skin at bedtime. 03/27/15   Quentin Angst, MD  insulin glargine (LANTUS) 100 unit/mL SOPN Inject 30 Units into the skin at bedtime.    Historical Provider, MD  metFORMIN (GLUCOPHAGE) 1000 MG tablet Take 1 tablet (1,000 mg total) by mouth 2 (two) times daily with a meal. 06/04/15   Olugbemiga E Hyman Hopes, MD  olmesartan (BENICAR) 20 MG tablet Take 1 tablet (20 mg total) by mouth daily. 04/03/15   Josalyn Funches, MD  pregabalin (LYRICA) 75 MG capsule Take 150 mg by mouth at bedtime.    Historical Provider, MD  rosuvastatin (CRESTOR) 20 MG tablet Take 1 tablet (20 mg total) by mouth daily.  08/22/15   Quentin Angst, MD   BP 117/72 mmHg  Pulse 82  Temp(Src) 100 F (37.8 C) (Oral)  Resp 16  Ht  (1.702 m)  Wt 238 lb (107.956 kg)  BMI 37.27 kg/m2  SpO2 100%   Physical Exam  General: Well-developed, well-nourished male in no acute distress; appearance consistent with age of record HENT: normocephalic; atraumatic Eyes: pupils equal, round and reactive to light; extraocular muscles intact Neck: supple Heart: regular rate and rhythm with occasional ectopic beats; fixed split S2 at left upper sternal border Lungs: clear to auscultation bilaterally Chest: Pacemaker/defibrillator left upper chest Abdomen: soft; nondistended; mild  epigastric and right upper quadrant tenderness; no masses or hepatosplenomegaly; bowel sounds present Extremities: No deformity; full range of motion; pulses normal Neurologic: Awake, alert and oriented; motor function intact in all extremities and symmetric; no facial droop Skin: Warm and dry Psychiatric: Affect    ED Course  Procedures (including critical care time)   MDM   Nursing notes and vitals signs, including pulse oximetry, reviewed.  Summary of this visit's results, reviewed by myself:   EKG Interpretation  Date/Time:  Monday October 06 2015 07:00:14 EST Ventricular Rate:  65 PR Interval:  248 QRS Duration: 92 QT Interval:  460 QTC Calculation: 478 R Axis:   -129 Text Interpretation:  Atrial-sensed ventricular-paced rhythm with prolonged AV conduction Biventricular pacemaker detected Abnormal ECG No significant change was found (except no ectopy seen) Confirmed by Verdine Grenfell  MD, Jonny [name] ([id number]) on 10/06/2015 7:08:21 AM       Labs:  Results for orders placed or performed during the hospital encounter of 10/06/15 (from the past 24 hour(s))  Comprehensive metabolic panel     Status: Abnormal   Collection Time: 10/06/15  6:50 AM  Result Value Ref Range   Sodium 141 135 - 145 mmol/L   Potassium 5.0 3.5 - 5.1 mmol/L   Chloride 104 101 - 111 mmol/L   CO2 25 22 - 32 mmol/L   Glucose, Bld 173 (H) 65 - 99 mg/dL   BUN 28 (H) 6 - 20 mg/dL   Creatinine, Ser 6.04 0.61 - 1.24 mg/dL   Calcium 9.3 8.9 - 54.0 mg/dL   Total Protein 6.8 6.5 - 8.1 g/dL   Albumin 4.0 3.5 - 5.0 g/dL   AST 23 15 - 41 U/L   ALT 22 17 - 63 U/L   Alkaline Phosphatase 53 38 - 126 U/L   Total Bilirubin 1.2 0.3 - 1.2 mg/dL   GFR calc non Af Amer >60 >60 mL/min   GFR calc Af Amer >60 >60 mL/min   Anion gap 12 5 - 15  Lipase, blood     Status: None   Collection Time: 10/06/15  6:50 AM  Result Value Ref Range   Lipase 22 11 - 51 U/L  CBC with Differential/Platelet     Status: Abnormal   Collection Time:  10/06/15  6:50 AM  Result Value Ref Range   WBC 8.4 4.0 - 10.5 K/uL   RBC 6.10 (H) 4.22 - 5.81 MIL/uL   Hemoglobin 15.0 13.0 - 17.0 g/dL   HCT 98.1 19.1 - 47.8 %   MCV 76.1 (L) 78.0 - 100.0 fL   MCH 24.6 (L) 26.0 - 34.0 pg   MCHC 32.3 30.0 - 36.0 g/dL   RDW 29.5 (H) 62.1 - 30.8 %   Platelets 175 150 - 400 K/uL   Neutrophils Relative % 82 %   Lymphocytes Relative 9 %   Monocytes  Relative 6 %   Eosinophils Relative 3 %   Basophils Relative 0 %   Neutro Abs 6.8 1.7 - 7.7 K/uL   Lymphs Abs 0.8 0.7 - 4.0 K/uL   Monocytes Absolute 0.5 0.1 - 1.0 K/uL   Eosinophils Absolute 0.3 0.0 - 0.7 K/uL   Basophils Absolute 0.0 0.0 - 0.1 K/uL   RBC Morphology ELLIPTOCYTES    Smear Review LARGE PLATELETS PRESENT   Urinalysis, Routine w reflex microscopic (not at [facility])     Status: Abnormal   Collection Time: 10/06/15  9:20 AM  Result Value Ref Range   Color, Urine AMBER (A) YELLOW   APPearance CLEAR CLEAR   Specific Gravity, Urine 1.029 1.005 - 1.030   pH 6.0 5.0 - 8.0   Glucose, UA NEGATIVE NEGATIVE mg/dL   Hgb urine dipstick TRACE (A) NEGATIVE   Bilirubin Urine NEGATIVE NEGATIVE   Ketones, ur 15 (A) NEGATIVE mg/dL   Protein, ur >161 (A) NEGATIVE mg/dL   Nitrite NEGATIVE NEGATIVE   Leukocytes, UA NEGATIVE NEGATIVE  Urine microscopic-add on     Status: Abnormal   Collection Time: 10/06/15  9:20 AM  Result Value Ref Range   Squamous Epithelial / LPF 0-5 (A) NONE SEEN   WBC, UA 0-5 0 - 5 WBC/hpf   RBC / HPF 0-5 0 - 5 RBC/hpf   Bacteria, UA RARE (A) NONE SEEN   Urine-Other MUCOUS PRESENT   CBG monitoring, ED     Status: Abnormal   Collection Time: 10/06/15  1:09 PM  Result Value Ref Range   Glucose-Capillary 170 (H) 65 - 99 mg/dL  Magnesium     Status: None   Collection Time: 10/06/15  5:27 PM  Result Value Ref Range   Magnesium 1.7 1.7 - 2.4 mg/dL  Phosphorus     Status: Abnormal   Collection Time: 10/06/15  5:27 PM  Result Value Ref Range   Phosphorus 4.7 (H) 2.5 - 4.6 mg/dL   Glucose, capillary     Status: Abnormal   Collection Time: 10/06/15  5:29 PM  Result Value Ref Range   Glucose-Capillary 163 (H) 65 - 99 mg/dL  Brain natriuretic peptide     Status: Abnormal   Collection Time: 10/06/15  7:39 PM  Result Value Ref Range   B Natriuretic Peptide 1374.4 (H) 0.0 - 100.0 pg/mL  Glucose, capillary     Status: Abnormal   Collection Time: 10/06/15  9:43 PM  Result Value Ref Range   Glucose-Capillary 143 (H) 65 - 99 mg/dL   Comment 1 Notify RN    Comment 2 Document in Chart     Imaging Studies: Dg Chest 2 View  10/06/2015  CLINICAL DATA:  Preop for cholelithiasis EXAM: CHEST  2 VIEW COMPARISON:  07/14/2015 FINDINGS: Cardiomegaly again noted. Central mild vascular congestion without convincing pulmonary edema. 3 leads cardiac pacemaker is unchanged in position. Mild basilar atelectasis. No segmental infiltrate. IMPRESSION: Cardiomegaly. Three leads cardiac pacemaker in place. Central mild vascular congestion without pulmonary edema. Mild basilar atelectasis. Electronically Signed   By: Natasha Mead M.D.   On: 10/06/2015 13:23   US Abdomen Limited Ruq  10/06/2015  CLINICAL DATA:  Right upper quadrant pain for 1 day EXAM: US ABDOMEN LIMITED - RIGHT UPPER QUADRANT COMPARISON:  08/10/2014 FINDINGS: Gallbladder: Stable gallstone is again identified. Mild wall thickening is noted to 4 mm. A positive sonographic Eulah Pont sign is seen. Common bile duct: Diameter: 4 mm Liver: No focal lesion identified. Within normal limits in parenchymal echogenicity.  Incidental note is again made of a right renal calculus measuring 9 mm. It is nonobstructive in nature. IMPRESSION: Cholelithiasis with positive sonographic Murphy's sign and mild wall thickening. Right renal stone. Electronically Signed   By: Alcide Clever M.D.   On: 10/06/2015 08:52     7:27 AM Awaiting ultrasound to evaluate gallbladder. Dr. Manus Gunning will follow up and make disposition.     Paula Libra, MD 10/06/15 [id number]

## 2015-10-06 NOTE — ED Notes (Signed)
Patient transported to Ultrasound 

## 2015-10-06 NOTE — ED Provider Notes (Signed)
Care assumed from Dr. Read Drivers. LFTs and lipase normal.  [place] with gallstones and thickened gallbladder wall.   D/w Dr. Rowan Blase.  She requests medical admission to [facility] and will evaluate.  D/w Dr. Konrad Dolores who accepts patient to [facility]  Dr Derrell Lolling [facility] surgery) is in the OR and message left with his nurse.  Glynn Octave, MD 10/06/15 [phone]

## 2015-10-06 NOTE — ED Notes (Signed)
Pt states his pain has increased, requests more pain medication. Dr. Tollie Pizza informed.

## 2015-10-06 NOTE — Consult Note (Signed)
Reason for Consult:likely cholecystitis Referring Physician: Dr Payton Emerald  Benjamin [name] is an 55 y.o. male.  HPI: 32 yom with a couple prior episodes of ruq pain. Was told before should see a Psychologist, sport and exercise but didn't.  This episode started less than 24 hours ago. Tried oxycodone and tramadol without relief.  Some n/v, no fevers, not going away.  He has trouble with any activity requiring walking including going his second floor bedroom.  He underwent US with possible cholecystitis and was transferred here  Past Medical History  Diagnosis Date  . Hypertension   . Diabetes mellitus without complication ([place])   . High cholesterol   . Kidney stone   . Cholelithiasis   . Chronic systolic heart failure [facility])     Past Surgical History  Procedure Laterality Date  . Colonoscopy N/A 07/12/2013    Procedure: COLONOSCOPY;  Surgeon: Lear Ng, MD;  Location: [facility] ENDOSCOPY;  Service: Endoscopy;  Laterality: N/A;  . Ep implantable device N/A 05/09/2015    Procedure: BiV ICD Insertion CRT-D;  Surgeon: Evans Lance, MD;  Location: [facility] CV LAB;  Service: Cardiovascular;  Laterality: N/A;    Family History  Problem Relation Age of Onset  . Diabetes Mother   . Hypertension Mother   . Hyperlipidemia Mother   . Stroke Mother   . Diabetes Father   . Hypertension Father   . Stroke Father 84    Social History:  reports that he has never smoked. He does not have any smokeless tobacco history on file. He reports that he does not drink alcohol or use illicit drugs.  Allergies: No Known Allergies  Medications: I have reviewed the patient's current medications.  Results for orders placed or performed during the hospital encounter of 10/06/15 (from the past 48 hour(s))  Comprehensive metabolic panel     Status: Abnormal   Collection Time: 10/06/15  6:50 AM  Result Value Ref Range   Sodium 141 135 - 145 mmol/L   Potassium 5.0 3.5 - 5.1 mmol/L   Chloride 104 101 - 111 mmol/L   CO2 25 22  - 32 mmol/L   Glucose, Bld 173 (H) 65 - 99 mg/dL   BUN 28 (H) 6 - 20 mg/dL   Creatinine, Ser 1.08 0.61 - 1.24 mg/dL   Calcium 9.3 8.9 - 10.3 mg/dL   Total Protein 6.8 6.5 - 8.1 g/dL   Albumin 4.0 3.5 - 5.0 g/dL   AST 23 15 - 41 U/L   ALT 22 17 - 63 U/L   Alkaline Phosphatase 53 38 - 126 U/L   Total Bilirubin 1.2 0.3 - 1.2 mg/dL   GFR calc non Af Amer >60 >60 mL/min   GFR calc Af Amer >60 >60 mL/min    Comment: (NOTE) The eGFR has been calculated using the CKD EPI equation. This calculation has not been validated in all clinical situations. eGFR's persistently <60 mL/min signify possible Chronic Kidney Disease.    Anion gap 12 5 - 15  Lipase, blood     Status: None   Collection Time: 10/06/15  6:50 AM  Result Value Ref Range   Lipase 22 11 - 51 U/L  CBC with Differential/Platelet     Status: Abnormal   Collection Time: 10/06/15  6:50 AM  Result Value Ref Range   WBC 8.4 4.0 - 10.5 K/uL   RBC 6.10 (H) 4.22 - 5.81 MIL/uL   Hemoglobin 15.0 13.0 - 17.0 g/dL   HCT 46.4 39.0 - 52.0 %  MCV 76.1 (L) 78.0 - 100.0 fL   MCH 24.6 (L) 26.0 - 34.0 pg   MCHC 32.3 30.0 - 36.0 g/dL   RDW 91.1 (H) 53.5 - 14.8 %   Platelets 175 150 - 400 K/uL    Comment: SPECIMEN CHECKED FOR CLOTS PLATELET COUNT CONFIRMED BY SMEAR    Neutrophils Relative % 82 %   Lymphocytes Relative 9 %   Monocytes Relative 6 %   Eosinophils Relative 3 %   Basophils Relative 0 %   Neutro Abs 6.8 1.7 - 7.7 K/uL   Lymphs Abs 0.8 0.7 - 4.0 K/uL   Monocytes Absolute 0.5 0.1 - 1.0 K/uL   Eosinophils Absolute 0.3 0.0 - 0.7 K/uL   Basophils Absolute 0.0 0.0 - 0.1 K/uL   RBC Morphology ELLIPTOCYTES     Comment: BURR CELLS   Smear Review LARGE PLATELETS PRESENT     Comment: PLATELETS APPEAR ADEQUATE  Urinalysis, Routine w reflex microscopic (not at [facility])     Status: Abnormal   Collection Time: 10/06/15  9:20 AM  Result Value Ref Range   Color, Urine AMBER (A) YELLOW    Comment: BIOCHEMICALS MAY BE AFFECTED BY COLOR    APPearance CLEAR CLEAR   Specific Gravity, Urine 1.029 1.005 - 1.030   pH 6.0 5.0 - 8.0   Glucose, UA NEGATIVE NEGATIVE mg/dL   Hgb urine dipstick TRACE (A) NEGATIVE   Bilirubin Urine NEGATIVE NEGATIVE   Ketones, ur 15 (A) NEGATIVE mg/dL   Protein, ur >259 (A) NEGATIVE mg/dL   Nitrite NEGATIVE NEGATIVE   Leukocytes, UA NEGATIVE NEGATIVE  Urine microscopic-add on     Status: Abnormal   Collection Time: 10/06/15  9:20 AM  Result Value Ref Range   Squamous Epithelial / LPF 0-5 (A) NONE SEEN   WBC, UA 0-5 0 - 5 WBC/hpf   RBC / HPF 0-5 0 - 5 RBC/hpf   Bacteria, UA RARE (A) NONE SEEN   Urine-Other MUCOUS PRESENT   CBG monitoring, ED     Status: Abnormal   Collection Time: 10/06/15  1:09 PM  Result Value Ref Range   Glucose-Capillary 170 (H) 65 - 99 mg/dL  Glucose, capillary     Status: Abnormal   Collection Time: 10/06/15  5:29 PM  Result Value Ref Range   Glucose-Capillary 163 (H) 65 - 99 mg/dL    Dg Chest 2 View  03/04/7888  CLINICAL DATA:  Preop for cholelithiasis EXAM: CHEST  2 VIEW COMPARISON:  07/14/2015 FINDINGS: Cardiomegaly again noted. Central mild vascular congestion without convincing pulmonary edema. 3 leads cardiac pacemaker is unchanged in position. Mild basilar atelectasis. No segmental infiltrate. IMPRESSION: Cardiomegaly. Three leads cardiac pacemaker in place. Central mild vascular congestion without pulmonary edema. Mild basilar atelectasis. Electronically Signed   By: Natasha Mead M.D.   On: 10/06/2015 13:23   US Abdomen Limited Ruq  10/06/2015  CLINICAL DATA:  Right upper quadrant pain for 1 day EXAM: US ABDOMEN LIMITED - RIGHT UPPER QUADRANT COMPARISON:  08/10/2014 FINDINGS: Gallbladder: Stable gallstone is again identified. Mild wall thickening is noted to 4 mm. A positive sonographic Eulah Pont sign is seen. Common bile duct: Diameter: 4 mm Liver: No focal lesion identified. Within normal limits in parenchymal echogenicity. Incidental note is again made of a right renal  calculus measuring 9 mm. It is nonobstructive in nature. IMPRESSION: Cholelithiasis with positive sonographic Murphy's sign and mild wall thickening. Right renal stone. Electronically Signed   By: Alcide Clever M.D.   On: 10/06/2015 08:52  Review of Systems  Constitutional: Negative for fever and chills.  Respiratory: Positive for shortness of breath.   Cardiovascular: Negative for chest pain.  Gastrointestinal: Positive for nausea, vomiting and abdominal pain. Negative for constipation.  Genitourinary: Negative for dysuria.   Blood pressure 133/79, pulse 78, temperature 99.3 F (37.4 C), temperature source Oral, resp. rate 18, height '5\' 7"'$  (1.702 m), weight 107.956 kg (238 lb), SpO2 95 %. Physical Exam  Vitals reviewed. Constitutional: He is oriented to person, place, and time. He appears well-developed and well-nourished.  Eyes: No scleral icterus.  Neck: Neck supple.  Cardiovascular: Normal rate, regular rhythm and normal heart sounds.   Respiratory: Effort normal and breath sounds normal.  GI: Soft. He exhibits no distension. There is tenderness in the right upper quadrant.  Neurological: He is alert and oriented to person, place, and time.    Assessment/Plan: Cholecystitis  Plan lap chole after cards evaluation and recs if ok for surgery now  [facility] 10/06/2015, 5:46 PM

## 2015-10-06 NOTE — H&P (Signed)
[facility] History and Physical  Benjamin [name] ZOX:[id number] DOB: Jun 20, 1961 DOA: 10/06/2015  Referring physician: Emergency Department PCP: Jeanann Lewandowsky, MD   CHIEF COMPLAINT:                   HPI: Benjamin [name] is a 55 y.o. male with type II diabetes ,HTN, chronic systolic heart failure with ICD. Followed by Dr. Lewayne Bunting. Patient seen today at [facility] today with nausea, vomiting and upper abdominal pain. The pain started around 6 AM this morning, it was located in the right upper quadrant. Radiation through to the back . Patient had similar pain about 6 months ago, told it was his gallbladder but nothing needed to be done unless "it became infected ". Right upper quadrant pain was severe, constant, unrelated to meals . Patient took some oxycodone he had at home for history of kidney stones but obtained no relief. He followed that with Ultram which did not help either . In ED, ultrasound reveals cholelithiasis and mild gallbladder wall thickening. EDP at [facility] spoke with surgeon Dr. Donell Beers who requested transfer here.        ED COURSE:           Labs:   BUN 28, creatinine 1.08, glucose 173 Normal transaminases, normal alkaline phosphatase and bilirubin. Lipase normal at 22.  Urinalysis:  Clear, rare bacteria, negative leukocytes, WBC 0-5               CXR:    Cardiomegaly. Central mild vascular congestion without pulmonary edema.        EKG:    Atrial-sensed ventricular-paced rhythm with prolonged AV conduction Biventricular pacemaker detected Abnormal ECG No significant change was found (except no ectopy seen) Confirmed by Read Drivers MD, Jonny [name] ([id number]) on 10/06/2015 7:08:21 AM                    Medications  0.9 %  sodium chloride infusion ( Intravenous Stopped 10/06/15 1203)  ondansetron (ZOFRAN) injection 4 mg (4 mg Intravenous Given 10/06/15 0658)  fentaNYL (SUBLIMAZE) injection 100 mcg (100 mcg Intravenous Given 10/06/15 0658)  fentaNYL (SUBLIMAZE)  injection 50 mcg (50 mcg Intravenous Given 10/06/15 0904)  cefTRIAXone (ROCEPHIN) 2 g in dextrose 5 % 50 mL IVPB (0 g Intravenous Stopped 10/06/15 1015)  fentaNYL (SUBLIMAZE) injection 50 mcg (50 mcg Intravenous Given 10/06/15 1201)    Review of Systems  Constitutional: Negative.   HENT: Negative.   Eyes: Negative.   Respiratory: Positive for shortness of breath.   Cardiovascular: Negative.   Gastrointestinal: Positive for nausea, vomiting and abdominal pain.  Genitourinary: Negative.   Musculoskeletal: Negative.   Skin: Negative.   Neurological: Negative.   Endo/Heme/Allergies: Negative.   Psychiatric/Behavioral: Negative.     Past Medical History  Diagnosis Date  . Hypertension   . Diabetes mellitus without complication (HCC)   . High cholesterol   . Kidney stone   . Cholelithiasis   . Chronic systolic heart failure [facility])    Past Surgical History  Procedure Laterality Date  . Colonoscopy N/A 07/12/2013    Procedure: COLONOSCOPY;  Surgeon: Shirley Friar, MD;  Location: [facility] ENDOSCOPY;  Service: Endoscopy;  Laterality: N/A;  . Ep implantable device N/A 05/09/2015    Procedure: BiV ICD Insertion CRT-D;  Surgeon: Marinus Maw, MD;  Location: [facility] INVASIVE CV LAB;  Service: Cardiovascular;  Laterality: N/A;    SOCIAL HISTORY:  reports that he has never smoked. He does not have  any smokeless tobacco history on file. He reports that he does not drink alcohol or use illicit drugs. Lives: at home with family   Assistive devices:   None needed for ambulation.   No Known Allergies  Family History  Problem Relation Age of Onset  . Diabetes Mother   . Hypertension Mother   . Hyperlipidemia Mother   . Stroke Mother   . Diabetes Father   . Hypertension Father   . Stroke Father 58    Prior to Admission medications   Medication Sig Start Date End Date Taking? Authorizing Provider  aspirin EC 81 MG tablet Take 81 mg by mouth daily.    Historical Provider, MD  benzonatate  (TESSALON) 100 MG capsule Take 1 capsule (100 mg total) by mouth every 8 (eight) hours. 07/14/15   Tharon Aquas, PA  carvedilol (COREG) 25 MG tablet Take 1 tablet (25 mg total) by mouth 2 (two) times daily with a meal. 03/27/15   Quentin Angst, MD  furosemide (LASIX) 20 MG tablet Take 1 tablet (20 mg total) by mouth daily. 09/18/15   Marinus Maw, MD  insulin aspart (NOVOLOG FLEXPEN) 100 UNIT/ML FlexPen Inject 10 Units into the skin 2 (two) times daily with a meal.    Historical Provider, MD  insulin glargine (LANTUS) 100 UNIT/ML injection Inject 0.3 mLs (30 Units total) into the skin at bedtime. 03/27/15   Quentin Angst, MD  insulin glargine (LANTUS) 100 unit/mL SOPN Inject 30 Units into the skin at bedtime.    Historical Provider, MD  metFORMIN (GLUCOPHAGE) 1000 MG tablet Take 1 tablet (1,000 mg total) by mouth 2 (two) times daily with a meal. 06/04/15   Olugbemiga E Hyman Hopes, MD  olmesartan (BENICAR) 20 MG tablet Take 1 tablet (20 mg total) by mouth daily. 04/03/15   Josalyn Funches, MD  pregabalin (LYRICA) 75 MG capsule Take 150 mg by mouth at bedtime.    Historical Provider, MD  rosuvastatin (CRESTOR) 20 MG tablet Take 1 tablet (20 mg total) by mouth daily. 08/22/15   Quentin Angst, MD   PHYSICAL EXAM: Filed Vitals:   10/06/15 1230 10/06/15 1300 10/06/15 1421 10/06/15 1430  BP: 145/100 143/93 149/99 146/94  Pulse:   80 78  Temp:   98.4 F (36.9 C)   TempSrc:   Oral   Resp:   18   Height:      Weight:      SpO2:   98% 97%    Wt Readings from Last 3 Encounters:  10/06/15 107.956 kg (238 lb)  09/18/15 110.768 kg (244 lb 3.2 oz)  06/30/15 110.043 kg (242 lb 9.6 oz)    General:  Pleasant obese male, just finished with prayers. Appears calm and comfortable Eyes: PER, normal lids, irises & conjunctiva ENT: grossly normal hearing, lips & tongue Neck: no LAD, no masses Cardiovascular: RRR, no murmurs. No LE edema.  Respiratory: Respirations even and unlabored. Normal  respiratory effort. Lungs CTA bilaterally, no wheezes / rales .   Abdomen: soft, non-distended, Mild RUQ tenderness, active bowel sounds. No obvious masses.  Skin: no rash seen on limited exam Musculoskeletal: grossly normal tone BUE/BLE Psychiatric: grossly normal mood and affect, speech fluent and appropriate Neurologic: grossly non-focal.         LABS ON ADMISSION:    Basic Metabolic Panel:  Recent Labs Lab 10/06/15 0650  NA 141  K 5.0  CL 104  CO2 25  GLUCOSE 173*  BUN 28*  CREATININE 1.08  CALCIUM 9.3   Liver Function Tests:  Recent Labs Lab 10/06/15 0650  AST 23  ALT 22  ALKPHOS 53  BILITOT 1.2  PROT 6.8  ALBUMIN 4.0    CBC:  Recent Labs Lab 10/06/15 0650  WBC 8.4  NEUTROABS 6.8  HGB 15.0  HCT 46.4  MCV 76.1*  PLT 175   CREATININE: 1.08 (10/06/15 0650) Estimated creatinine clearance - 92.8 mL/min  Radiological Exams on Admission: Dg Chest 2 View  10/06/2015  CLINICAL DATA:  Preop for cholelithiasis EXAM: CHEST  2 VIEW COMPARISON:  07/14/2015 FINDINGS: Cardiomegaly again noted. Central mild vascular congestion without convincing pulmonary edema. 3 leads cardiac pacemaker is unchanged in position. Mild basilar atelectasis. No segmental infiltrate. IMPRESSION: Cardiomegaly. Three leads cardiac pacemaker in place. Central mild vascular congestion without pulmonary edema. Mild basilar atelectasis. Electronically Signed   By: Natasha Mead M.D.   On: 10/06/2015 13:23   US Abdomen Limited Ruq  10/06/2015  CLINICAL DATA:  Right upper quadrant pain for 1 day EXAM: US ABDOMEN LIMITED - RIGHT UPPER QUADRANT COMPARISON:  08/10/2014 FINDINGS: Gallbladder: Stable gallstone is again identified. Mild wall thickening is noted to 4 mm. A positive sonographic Eulah Pont sign is seen. Common bile duct: Diameter: 4 mm Liver: No focal lesion identified. Within normal limits in parenchymal echogenicity. Incidental note is again made of a right renal calculus measuring 9 mm. It is  nonobstructive in nature. IMPRESSION: Cholelithiasis with positive sonographic Murphy's sign and mild wall thickening. Right renal stone. Electronically Signed   By: Alcide Clever M.D.   On: 10/06/2015 08:52    ASSESSMENT / PLAN    Cholelithiasis/cholecystitis. Transferred from [facility] today.  -admit to Medical bed -keep NPO until seen by Surgery. Consult called to Dr. Derrell Lolling -Gentle hydration with IVF while NPO. -Cardiology called for pre-op clearance  Chronic systolic heart failure / left bundle branch block, s/p BiV ICD.  No pulmonary edema on CXR. No edema. Chronic shortness of breath (unchanged). -continue home lasix, coreg, ACEI -daily weights, I and O.   Type 2 diabetes. Hgb a1c in Epic late August was 8.9. -obtain hgb a1c -Hold home metformin and home basal insulin -Monitor CBGs, sliding scale insulin - resistant  -resume Lantus when taking PO  Hypertension. Elevated BP today.  -Continue home ACEI, Beta blocker   Hyperlipidemia. -Hold home statin for now  Neuropathy. -continue home Lyrica  CONSULTANTS:    [facility]  Code Status: full code DVT Prophylaxis: SCDs Family Communication:  Patient alert, oriented and understands plan of care.  Disposition Plan: Discharge to home in 24-48 hours   Time spent: 60 minutes Willette Cluster  NP [facility] Pager [phone]

## 2015-10-06 NOTE — ED Notes (Signed)
Patient transported to radiology department via stretcher. 

## 2015-10-06 NOTE — Consult Note (Signed)
  Cardiologist:  Taylor.  Previously seen by Dr. Wall Reason for Consult: Pre-op clearance Referring Physician:  Merrell  Benjamin [name] is an 54 y.o. male.  HPI:   Patient is a 54-year-old male with history of nonischemic cardiomyopathy hypertension, diabetes mellitus, hyperlipidemia, chronic systolic heart failure. He had a St. Jude bi-V ICD implanted October 2016.  His last echocardiogram was August 2016 ejection fraction was 20% with diffuse hypokinesis. Mild LVH. Mild MR. Left atrium severely dilated.  He was seen on February 16 Dr. Taylor at that time he admitted to both dietary and medical noncompliance. His HCTZ was stopped and he was started on Lasix.   His St. Jude device was working normally at that time.   Patient was admitted with acute cholecystitis.  He is A sensing V pacing on his EKG.  Patient reports developing right upper quadrant abdominal pain early this morning with nausea, vomiting. He has a history of cholelithiasis and figured it was his gallbladder.  Patient sleeps on 3 pillows because he breathes much better.   He gets short of breath with minimal exertion. He does not weigh himself on a regular basis.  His weight in the office was 244 pounds and is currently 238.  The patient currently denies  fever, chest pain, dizziness, cough, congestion, abdominal pain, hematochezia, melena, lower extremity edema, claudication.   Past Medical History  Diagnosis Date  . Hypertension   . Diabetes mellitus without complication (HCC)   . High cholesterol   . Kidney stone   . Cholelithiasis   . Chronic systolic heart failure (HCC)     Past Surgical History  Procedure Laterality Date  . Colonoscopy N/A 07/12/2013    Procedure: COLONOSCOPY;  Surgeon: Vincent C. Schooler, MD;  Location: [facility];  Service: Endoscopy;  Laterality: N/A;  . Ep implantable device N/A 05/09/2015    Procedure: BiV ICD Insertion CRT-D;  Surgeon: Gregg W Taylor, MD;  Location: [facility] INVASIVE CV LAB;   Service: Cardiovascular;  Laterality: N/A;    Family History  Problem Relation Age of Onset  . Diabetes Mother   . Hypertension Mother   . Hyperlipidemia Mother   . Stroke Mother   . Diabetes Father   . Hypertension Father   . Stroke Father 61    Social History:  reports that he has never smoked. He does not have any smokeless tobacco history on file. He reports that he does not drink alcohol or use illicit drugs.  Allergies: No Known Allergies  Medications: Medication Sig  aspirin EC 81 MG tablet Take 81 mg by mouth daily.  benzonatate (TESSALON) 100 MG capsule Take 1 capsule (100 mg total) by mouth every 8 (eight) hours.  carvedilol (COREG) 25 MG tablet Take 1 tablet (25 mg total) by mouth 2 (two) times daily with a meal.  furosemide (LASIX) 20 MG tablet Take 1 tablet (20 mg total) by mouth daily.  insulin aspart (NOVOLOG FLEXPEN) 100 UNIT/ML FlexPen Inject 10 Units into the skin 2 (two) times daily with a meal.  insulin glargine (LANTUS) 100 UNIT/ML injection Inject 0.3 mLs (30 Units total) into the skin at bedtime.  insulin glargine (LANTUS) 100 unit/mL SOPN Inject 30 Units into the skin at bedtime.  metFORMIN (GLUCOPHAGE) 1000 MG tablet Take 1 tablet (1,000 mg total) by mouth 2 (two) times daily with a meal.  olmesartan (BENICAR) 20 MG tablet Take 1 tablet (20 mg total) by mouth daily.  pregabalin (LYRICA) 75 MG capsule Take 150 mg by   mouth at bedtime.  rosuvastatin (CRESTOR) 20 MG tablet Take 1 tablet (20 mg total) by mouth daily.     Results for orders placed or performed during the hospital encounter of 10/06/15 (from the past 48 hour(s))  Comprehensive metabolic panel     Status: Abnormal   Collection Time: 10/06/15  6:50 AM  Result Value Ref Range   Sodium 141 135 - 145 mmol/L   Potassium 5.0 3.5 - 5.1 mmol/L   Chloride 104 101 - 111 mmol/L   CO2 25 22 - 32 mmol/L   Glucose, Bld 173 (H) 65 - 99 mg/dL   BUN 28 (H) 6 - 20 mg/dL   Creatinine, Ser 1.08 0.61 - 1.24  mg/dL   Calcium 9.3 8.9 - 10.3 mg/dL   Total Protein 6.8 6.5 - 8.1 g/dL   Albumin 4.0 3.5 - 5.0 g/dL   AST 23 15 - 41 U/L   ALT 22 17 - 63 U/L   Alkaline Phosphatase 53 38 - 126 U/L   Total Bilirubin 1.2 0.3 - 1.2 mg/dL   GFR calc non Af Amer >60 >60 mL/min   GFR calc Af Amer >60 >60 mL/min    Comment: (NOTE) The eGFR has been calculated using the CKD EPI equation. This calculation has not been validated in all clinical situations. eGFR's persistently <60 mL/min signify possible Chronic Kidney Disease.    Anion gap 12 5 - 15  Lipase, blood     Status: None   Collection Time: 10/06/15  6:50 AM  Result Value Ref Range   Lipase 22 11 - 51 U/L  CBC with Differential/Platelet     Status: Abnormal   Collection Time: 10/06/15  6:50 AM  Result Value Ref Range   WBC 8.4 4.0 - 10.5 K/uL   RBC 6.10 (H) 4.22 - 5.81 MIL/uL   Hemoglobin 15.0 13.0 - 17.0 g/dL   HCT 46.4 39.0 - 52.0 %   MCV 76.1 (L) 78.0 - 100.0 fL   MCH 24.6 (L) 26.0 - 34.0 pg   MCHC 32.3 30.0 - 36.0 g/dL   RDW 20.1 (H) 11.5 - 15.5 %   Platelets 175 150 - 400 K/uL    Comment: SPECIMEN CHECKED FOR CLOTS PLATELET COUNT CONFIRMED BY SMEAR    Neutrophils Relative % 82 %   Lymphocytes Relative 9 %   Monocytes Relative 6 %   Eosinophils Relative 3 %   Basophils Relative 0 %   Neutro Abs 6.8 1.7 - 7.7 K/uL   Lymphs Abs 0.8 0.7 - 4.0 K/uL   Monocytes Absolute 0.5 0.1 - 1.0 K/uL   Eosinophils Absolute 0.3 0.0 - 0.7 K/uL   Basophils Absolute 0.0 0.0 - 0.1 K/uL   RBC Morphology ELLIPTOCYTES     Comment: BURR CELLS   Smear Review LARGE PLATELETS PRESENT     Comment: PLATELETS APPEAR ADEQUATE  Urinalysis, Routine w reflex microscopic (not at [facility])     Status: Abnormal   Collection Time: 10/06/15  9:20 AM  Result Value Ref Range   Color, Urine AMBER (A) YELLOW    Comment: BIOCHEMICALS MAY BE AFFECTED BY COLOR   APPearance CLEAR CLEAR   Specific Gravity, Urine 1.029 1.005 - 1.030   pH 6.0 5.0 - 8.0   Glucose, UA NEGATIVE  NEGATIVE mg/dL   Hgb urine dipstick TRACE (A) NEGATIVE   Bilirubin Urine NEGATIVE NEGATIVE   Ketones, ur 15 (A) NEGATIVE mg/dL   Protein, ur >300 (A) NEGATIVE mg/dL   Nitrite NEGATIVE NEGATIVE     Leukocytes, UA NEGATIVE NEGATIVE  Urine microscopic-add on     Status: Abnormal   Collection Time: 10/06/15  9:20 AM  Result Value Ref Range   Squamous Epithelial / LPF 0-5 (A) NONE SEEN   WBC, UA 0-5 0 - 5 WBC/hpf   RBC / HPF 0-5 0 - 5 RBC/hpf   Bacteria, UA RARE (A) NONE SEEN   Urine-Other MUCOUS PRESENT   CBG monitoring, ED     Status: Abnormal   Collection Time: 10/06/15  1:09 PM  Result Value Ref Range   Glucose-Capillary 170 (H) 65 - 99 mg/dL  Glucose, capillary     Status: Abnormal   Collection Time: 10/06/15  5:29 PM  Result Value Ref Range   Glucose-Capillary 163 (H) 65 - 99 mg/dL    Dg Chest 2 View  10/06/2015  CLINICAL DATA:  Preop for cholelithiasis EXAM: CHEST  2 VIEW COMPARISON:  07/14/2015 FINDINGS: Cardiomegaly again noted. Central mild vascular congestion without convincing pulmonary edema. 3 leads cardiac pacemaker is unchanged in position. Mild basilar atelectasis. No segmental infiltrate. IMPRESSION: Cardiomegaly. Three leads cardiac pacemaker in place. Central mild vascular congestion without pulmonary edema. Mild basilar atelectasis. Electronically Signed   By: Liviu  Pop M.D.   On: 10/06/2015 13:23   Us Abdomen Limited Ruq  10/06/2015  CLINICAL DATA:  Right upper quadrant pain for 1 day EXAM: US ABDOMEN LIMITED - RIGHT UPPER QUADRANT COMPARISON:  08/10/2014 FINDINGS: Gallbladder: Stable gallstone is again identified. Mild wall thickening is noted to 4 mm. A positive sonographic Murphy sign is seen. Common bile duct: Diameter: 4 mm Liver: No focal lesion identified. Within normal limits in parenchymal echogenicity. Incidental note is again made of a right renal calculus measuring 9 mm. It is nonobstructive in nature. IMPRESSION: Cholelithiasis with positive sonographic  Murphy's sign and mild wall thickening. Right renal stone. Electronically Signed   By: Mark  Lukens M.D.   On: 10/06/2015 08:52    Review of Systems  Constitutional: Negative for fever and chills.  HENT: Negative for congestion.   Respiratory: Positive for shortness of breath (With exertion ). Negative for cough.   Cardiovascular: Positive for orthopnea and PND. Negative for chest pain and leg swelling.  Gastrointestinal: Positive for nausea, vomiting and abdominal pain.  Genitourinary: Negative for hematuria.  Musculoskeletal: Negative for myalgias.  Neurological: Negative for dizziness.  All other systems reviewed and are negative.  Blood pressure 133/79, pulse 78, temperature 99.3 F (37.4 C), temperature source Oral, resp. rate 18, height 5' 7" (1.702 m), weight 238 lb (107.956 kg), SpO2 95 %. Physical Exam  Nursing note and vitals reviewed. Constitutional: He is oriented to person, place, and time. He appears well-developed. No distress.  Obese  HENT:  Head: Normocephalic and atraumatic.  Eyes: EOM are normal. Pupils are equal, round, and reactive to light. No scleral icterus.  Neck: Normal range of motion. Neck supple. JVD present.  Cardiovascular: Normal rate, regular rhythm, S1 normal and S2 normal.  Exam reveals gallop and S3.   No murmur heard. Pulses:      Radial pulses are 2+ on the right side, and 2+ on the left side.       Dorsalis pedis pulses are 2+ on the right side, and 2+ on the left side.  Respiratory: Effort normal. He has no wheezes. He has rales.  GI: Soft. Bowel sounds are normal.  Musculoskeletal: He exhibits edema.  Trace LEE   Lymphadenopathy:    He has no cervical adenopathy.  Neurological: He   is alert and oriented to person, place, and time. He exhibits normal muscle tone.  Skin: Skin is warm and dry.  Psychiatric: He has a normal mood and affect.    Assessment/Plan: Active Problems:   Essential hypertension   DM (diabetes mellitus) type 2,  uncontrolled, with ketoacidosis (HCC)   Cholecystitis   Neuropathy (HCC)   Acute on chronic systolic heart failure (HCC)   Preoperative clearance  54-year-old male with history of nonischemic cardiomyopathy hypertension, diabetes mellitus, hyperlipidemia, chronic systolic heart failure. He had a St. Jude bi-V ICD implanted October 2016.  His last echocardiogram was August 2016 ejection fraction was 20% with diffuse hypokinesis.  Patient presents with abdominal pain over the last couple of weeks and he has been struggling with orthopnea. His hydrochlorothiazide was recently discontinued and he was started on 20 mg of Lasix. He appears to be acutely volume overloaded right now based on his chest x-ray and exam although not significantly.   I ordered a BNP.   I'll give him a dose of IV Lasix 40 mg.    We are also seeing him for preoperative clearance for cholecystectomy.  MD to opinion to follow.      HAGER, BRYAN, PAC 10/06/2015, 5:44 PM  As above, patient seen and examined. Briefly he is a 54-year-old male with past medical history of cardiomyopathy, congestive heart failure, hypertension, diabetes mellitus, hyperlipidemia, biventricular ICD for preoperative evaluation prior to cholecystectomy. Patient has a long history of cardiomyopathy but has never had an ischemia evaluation since development of LV dysfunction. His last echocardiogram in August 2016 showed an ejection fraction of 20% and mild mitral regurgitation. Patient has chronic dyspnea on exertion. He cannot climb one flight of stairs without having dyspnea. There is mild orthopnea but no PND, pedal edema, exertional chest pain or syncope. He has developed cholecystitis and required cholecystectomy. Cardiology asked to evaluate preoperatively. Electrocardiogram shows sinus rhythm with ventricular pacing. 1 cardiomyopathy-continue beta blocker and ARB. The etiology of his cardiomyopathy is not clear. He has not had an ischemia evaluation since  it was diagnosed. He has greater than 20 years of diabetes mellitus, hypertension, hyperlipidemia and a family history of coronary disease. I think definitive evaluation is warranted. We will arrange a cardiac catheterization. The risks and benefits were discussed and he agrees to proceed. Add aspirin 81 mg daily. 2 congestive heart failure-patient appears to be euvolemic. Continue present dose of Lasix. He is being hydrated since he is NPO. Watch volume status closely.  3 preoperative evaluation prior to cholecystectomy-as outlined above I think the patient needs definitive evaluation for his cardiomyopathy. He has significant dyspnea on exertion which could also be an anginal equivalent. Plan to proceed with cardiac catheterization. If no significant coronary disease he may proceed with cholecystectomy afterwards. 4 hypertension-continue present medications. 5 hyperlipidemia-Resume statin at discharge. Justine Cossin     

## 2015-10-07 ENCOUNTER — Encounter (HOSPITAL_COMMUNITY): Payer: Self-pay | Admitting: Interventional Cardiology

## 2015-10-07 ENCOUNTER — Encounter (HOSPITAL_COMMUNITY)
Admission: EM | Disposition: A | Discharge status: Home / Self Care | Payer: Self-pay | Source: Home / Self Care | Attending: Internal Medicine

## 2015-10-07 ENCOUNTER — Inpatient Hospital Stay (HOSPITAL_COMMUNITY): Payer: Self-pay

## 2015-10-07 DIAGNOSIS — I5023 Acute on chronic systolic (congestive) heart failure: Secondary | ICD-10-CM

## 2015-10-07 DIAGNOSIS — I10 Essential (primary) hypertension: Secondary | ICD-10-CM | POA: Diagnosis not present

## 2015-10-07 DIAGNOSIS — I2510 Atherosclerotic heart disease of native coronary artery without angina pectoris: Secondary | ICD-10-CM | POA: Diagnosis not present

## 2015-10-07 DIAGNOSIS — E1310 Other specified diabetes mellitus with ketoacidosis without coma: Secondary | ICD-10-CM | POA: Diagnosis not present

## 2015-10-07 DIAGNOSIS — K819 Cholecystitis, unspecified: Secondary | ICD-10-CM | POA: Diagnosis not present

## 2015-10-07 HISTORY — PX: CARDIAC CATHETERIZATION: SHX172

## 2015-10-07 LAB — COMPREHENSIVE METABOLIC PANEL
ALT: 17 U/L (ref 17–63)
ANION GAP: 12 (ref 5–15)
AST: 25 U/L (ref 15–41)
Albumin: 3.3 g/dL — ABNORMAL LOW (ref 3.5–5.0)
Alkaline Phosphatase: 48 U/L (ref 38–126)
BUN: 27 mg/dL — AB (ref 6–20)
CHLORIDE: 104 mmol/L (ref 101–111)
CO2: 23 mmol/L (ref 22–32)
Calcium: 8.8 mg/dL — ABNORMAL LOW (ref 8.9–10.3)
Creatinine, Ser: 1.42 mg/dL — ABNORMAL HIGH (ref 0.61–1.24)
GFR, EST NON AFRICAN AMERICAN: 55 mL/min — AB (ref >60)
GLUCOSE: 140 mg/dL — AB (ref 65–99)
POTASSIUM: 4.3 mmol/L (ref 3.5–5.1)
SODIUM: 139 mmol/L (ref 135–145)
TOTAL PROTEIN: 6.3 g/dL — AB (ref 6.5–8.1)
Total Bilirubin: 1.4 mg/dL — ABNORMAL HIGH (ref 0.3–1.2)

## 2015-10-07 LAB — CBC
HCT: 48 % (ref 39.0–52.0)
Hemoglobin: 15.5 g/dL (ref 13.0–17.0)
MCH: 25 pg — AB (ref 26.0–34.0)
MCHC: 32.3 g/dL (ref 30.0–36.0)
MCV: 77.5 fL — AB (ref 78.0–100.0)
PLATELETS: 185 10*3/uL (ref 150–400)
RBC: 6.19 MIL/uL — ABNORMAL HIGH (ref 4.22–5.81)
RDW: 18.8 % — AB (ref 11.5–15.5)
WBC: 13.5 10*3/uL — ABNORMAL HIGH (ref 4.0–10.5)

## 2015-10-07 LAB — PROTIME-INR
INR: 1.43 (ref 0.00–1.49)
Prothrombin Time: 17.5 seconds — ABNORMAL HIGH (ref 11.6–15.2)

## 2015-10-07 LAB — GLUCOSE, CAPILLARY
Glucose-Capillary: 137 mg/dL — ABNORMAL HIGH (ref 65–99)
Glucose-Capillary: 248 mg/dL — ABNORMAL HIGH (ref 65–99)
Glucose-Capillary: 215 mg/dL — ABNORMAL HIGH (ref 65–99)

## 2015-10-07 LAB — HEMOGLOBIN A1C
Hgb A1c MFr Bld: 7.3 % — ABNORMAL HIGH (ref 4.8–5.6)
MEAN PLASMA GLUCOSE: 163 mg/dL

## 2015-10-07 LAB — LIPASE, BLOOD: LIPASE: 20 U/L (ref 11–51)

## 2015-10-07 SURGERY — LEFT HEART CATH AND CORONARY ANGIOGRAPHY
Anesthesia: LOCAL

## 2015-10-07 MED ORDER — SODIUM CHLORIDE 0.9% FLUSH: 3.0000 mL | Freq: Two times a day (BID) | INTRAVENOUS | Status: DC

## 2015-10-07 MED ORDER — ONDANSETRON HCL 4 MG/2ML IJ SOLN: 4.0000 mg | Freq: Four times a day (QID) | INTRAMUSCULAR | Status: DC | PRN

## 2015-10-07 MED ORDER — SODIUM CHLORIDE 0.9 % IV SOLN: INTRAVENOUS | Status: DC

## 2015-10-07 MED ORDER — SODIUM CHLORIDE 0.9 % IV SOLN
INTRAVENOUS | Status: DC | PRN
  Administered 2015-10-07: 10 mL/h via INTRAVENOUS

## 2015-10-07 MED ORDER — HEPARIN SODIUM (PORCINE) 1000 UNIT/ML IJ SOLN
INTRAMUSCULAR | Status: AC
  Filled 2015-10-07: qty 1

## 2015-10-07 MED ORDER — ASPIRIN 81 MG PO CHEW
81.0000 mg | CHEWABLE_TABLET | ORAL | Status: AC
  Administered 2015-10-07: 81 mg via ORAL
  Filled 2015-10-07: qty 1

## 2015-10-07 MED ORDER — SODIUM CHLORIDE 0.9% FLUSH
3.0000 mL | Freq: Two times a day (BID) | INTRAVENOUS | Status: DC
  Administered 2015-10-08 – 2015-10-09 (×3): 3 mL via INTRAVENOUS

## 2015-10-07 MED ORDER — LIDOCAINE HCL (PF) 1 % IJ SOLN
INTRAMUSCULAR | Status: AC
  Filled 2015-10-07: qty 30

## 2015-10-07 MED ORDER — SODIUM CHLORIDE 0.9% FLUSH: 3.0000 mL | INTRAVENOUS | Status: DC | PRN

## 2015-10-07 MED ORDER — SODIUM CHLORIDE 0.9% FLUSH: 10.0000 mL | INTRAVENOUS | Status: DC | PRN

## 2015-10-07 MED ORDER — MAGNESIUM SULFATE 2 GM/50ML IV SOLN
2.0000 g | Freq: Once | INTRAVENOUS | Status: AC
  Administered 2015-10-07: 2 g via INTRAVENOUS
  Filled 2015-10-07: qty 50

## 2015-10-07 MED ORDER — HEPARIN (PORCINE) IN NACL 2-0.9 UNIT/ML-% IJ SOLN
INTRAMUSCULAR | Status: DC | PRN
  Administered 2015-10-07: 1500 mL

## 2015-10-07 MED ORDER — HEPARIN SODIUM (PORCINE) 1000 UNIT/ML IJ SOLN
INTRAMUSCULAR | Status: DC | PRN
  Administered 2015-10-07: 5000 [IU] via INTRAVENOUS

## 2015-10-07 MED ORDER — LIDOCAINE HCL (PF) 1 % IJ SOLN
INTRAMUSCULAR | Status: DC | PRN
  Administered 2015-10-07: 2 mL via INTRADERMAL

## 2015-10-07 MED ORDER — CARVEDILOL 12.5 MG PO TABS
12.5000 mg | ORAL_TABLET | Freq: Two times a day (BID) | ORAL | Status: DC
  Administered 2015-10-07 – 2015-10-11 (×9): 12.5 mg via ORAL
  Filled 2015-10-07 (×9): qty 1

## 2015-10-07 MED ORDER — FUROSEMIDE 10 MG/ML IJ SOLN
80.0000 mg | Freq: Two times a day (BID) | INTRAMUSCULAR | Status: DC
  Administered 2015-10-07 (×2): 80 mg via INTRAVENOUS
  Filled 2015-10-07 (×2): qty 8

## 2015-10-07 MED ORDER — SODIUM CHLORIDE 0.9 % IV SOLN: 250.0000 mL | INTRAVENOUS | Status: DC | PRN

## 2015-10-07 MED ORDER — HEPARIN SODIUM (PORCINE) 5000 UNIT/ML IJ SOLN
5000.0000 [IU] | Freq: Three times a day (TID) | INTRAMUSCULAR | Status: DC
  Administered 2015-10-07 – 2015-10-09 (×5): 5000 [IU] via SUBCUTANEOUS
  Filled 2015-10-07 (×5): qty 1

## 2015-10-07 MED ORDER — SODIUM CHLORIDE 0.9% FLUSH
10.0000 mL | Freq: Two times a day (BID) | INTRAVENOUS | Status: DC
  Administered 2015-10-07 – 2015-10-10 (×5): 10 mL

## 2015-10-07 MED ORDER — IOHEXOL 350 MG/ML SOLN
INTRAVENOUS | Status: DC | PRN
  Administered 2015-10-07: 80 mL via INTRA_ARTERIAL

## 2015-10-07 MED ORDER — ACETAMINOPHEN 325 MG PO TABS
650.0000 mg | ORAL_TABLET | ORAL | Status: DC | PRN
Start: 2015-10-07 — End: 2015-10-11

## 2015-10-07 MED ORDER — ROSUVASTATIN CALCIUM 20 MG PO TABS
40.0000 mg | ORAL_TABLET | Freq: Every day | ORAL | Status: DC
  Administered 2015-10-07 – 2015-10-08 (×2): 40 mg via ORAL
  Filled 2015-10-07 (×3): qty 2

## 2015-10-07 MED ORDER — SODIUM CHLORIDE 0.9 % WEIGHT BASED INFUSION
1.0000 mL/kg/h | INTRAVENOUS | Status: DC
  Administered 2015-10-07: 09:00:00 1 mL/kg/h via INTRAVENOUS

## 2015-10-07 MED ORDER — VERAPAMIL HCL 2.5 MG/ML IV SOLN
INTRAVENOUS | Status: AC
  Filled 2015-10-07: qty 2

## 2015-10-07 MED ORDER — OXYCODONE-ACETAMINOPHEN 5-325 MG PO TABS
1.0000 | ORAL_TABLET | ORAL | Status: DC | PRN
  Administered 2015-10-10 – 2015-10-11 (×5): 2 via ORAL
  Filled 2015-10-07 (×5): qty 2

## 2015-10-07 MED ORDER — ASPIRIN 81 MG PO CHEW
81.0000 mg | CHEWABLE_TABLET | Freq: Every day | ORAL | Status: DC
  Administered 2015-10-08 – 2015-10-09 (×2): 81 mg via ORAL
  Filled 2015-10-07 (×2): qty 1

## 2015-10-07 MED ORDER — HEPARIN (PORCINE) IN NACL 2-0.9 UNIT/ML-% IJ SOLN
INTRAMUSCULAR | Status: AC
  Filled 2015-10-07: qty 1500

## 2015-10-07 SURGICAL SUPPLY — 9 items
CATH INFINITI 5 FR JL3.5 (CATHETERS) ×1 IMPLANT
CATH INFINITI JR4 5F (CATHETERS) ×1 IMPLANT
DEVICE RAD COMP TR BAND LRG (VASCULAR PRODUCTS) ×2 IMPLANT
GLIDESHEATH SLEND A-KIT 6F 22G (SHEATH) ×2 IMPLANT
KIT HEART LEFT (KITS) ×2 IMPLANT
PACK CARDIAC CATHETERIZATION (CUSTOM PROCEDURE TRAY) ×2 IMPLANT
TRANSDUCER W/STOPCOCK (MISCELLANEOUS) ×2 IMPLANT
TUBING CIL FLEX 10 FLL-RA (TUBING) ×2 IMPLANT
WIRE SAFE-T 1.5MM-J .035X260CM (WIRE) ×1 IMPLANT

## 2015-10-07 NOTE — Care Management Note (Signed)
Case Management Note  Patient Details  Name: Benjamin [name] MRN: [id number] Date of Birth: 10/12/1960  Subjective/Objective:     Date: 10/07/15 Spoke with patient at the bedside.  Introduced self as Sports coach and explained role in discharge planning and how to be reached.  Verified patient lives in town, with spouse, pta indep.  Expressed potential need for no  DME.  Verified patient anticipates to go home with family, at [facility] discharge and will have full-time supervision by family at this time to best of their knowledge. Patient has orange card and he goes to [facility] and [employer], he states he was getting meds from [facility] with orange card,  he states he has also applied for disability. Patient drives to MD appointments.  Verified patient has PCP [facility] [employer], he also sees Dr. Sharrell Ku (Cards).    Patient has requested private times for him not to be bothered , he prays 5 times a day  5:30 am- 6 am, 1- 1:30 pm, 4 pm, 1 1/2 hr after sunset and 7:30 pm.  Plan: CM will continue to follow for discharge planning and HH resources.                Action/Plan:   Expected Discharge Date:                  Expected Discharge Plan:  Home/Self Care  In-House Referral:     Discharge planning Services  CM Consult  Post Acute Care Choice:    Choice offered to:     DME Arranged:    DME Agency:     HH Arranged:    HH Agency:     Status of Service:  In process, will continue to follow  Medicare Important Message Given:    Date Medicare IM Given:    Medicare IM give by:    Date Additional Medicare IM Given:    Additional Medicare Important Message give by:     If discussed at Long Length of Stay Meetings, dates discussed:    Additional Comments:  Leone Haven, RN 10/07/2015, 4:40 PM

## 2015-10-07 NOTE — Progress Notes (Signed)
[facility] PROGRESS NOTE  AVRAM DANIELSON ZOX:[id number] DOB: Mar 10, 1961 DOA: 10/06/2015 PCP: Jeanann Lewandowsky, MD  Length of stay: 1   Assessment/Plan: Active Problems:   Essential hypertension   DM (diabetes mellitus) type 2, uncontrolled, with ketoacidosis (HCC)   Cholecystitis   Neuropathy (HCC)   Acute on chronic systolic heart failure (HCC)   Preoperative clearance   Preop cardiovascular exam   Brief summary 55 y.o. male with type II diabetes ,HTN, chronic systolic heart failure with ICD. Followed by Dr. Lewayne Bunting. Patient seen today at [facility] today with nausea, vomiting and upper abdominal pain. The pain started around 6 AM this morning, it was located in the right upper quadrant. Radiation through to the back . Patient had similar pain about 6 months ago, told it was his gallbladder but nothing needed to be done unless "it became infected ". Right upper quadrant pain was severe, constant, unrelated to meals .  Marland Kitchen In ED, ultrasound reveals cholelithiasis and mild gallbladder wall thickening.  Patient also with a possible cholecystectomy. Cardiology consulted for clearance.  Assessment and plan   Cholelithiasis/cholecystitis. Transferred from [facility] today.  Patient seen by general surgery, plan is for laparoscopic cholecystectomy after clearance by cardiology -Gentle hydration with IVF while NPO. -Cardiology called for pre-op clearance  Chronic systolic heart failure / left bundle branch block, s/p BiV ICD.  August 2016 ejection fraction was 20% with diffuse hypokinesis. Mild LVH. Mild MR. Left atrium severely dilated No pulmonary edema on CXR. No edema. Chronic shortness of breath (unchanged). Heart failure team consulted and following, medical management per cardiology -daily weights, I and O.   Acute kidney injury Creatinine has increased from 1.08> 1.4 Continue Lasix for diuresis,Hold benicar   Type 2 diabetes. Hgb a1c in Epic late August  was 8.9. Hemoglobin A1c 7.3 -Hold home metformin and home basal insulin -Monitor CBGs, sliding scale insulin - resistant  -resume Lantus when taking PO  Hypertension. Elevated BP today.  Continue Coreg,    Hyperlipidemia. -Hold home statin for now  Neuropathy. -continue home Lyrica    DVT prophylaxsis   Heparin subcutaneous  Code Status:      Code Status Orders        Start     Ordered   10/06/15 1710  Full code   Continuous     10/06/15 1711     Family Communication: Discussed in detail with the patient, all imaging results, lab results explained to the patient   Disposition Plan: Pending further recommendations from cardiology      Consultants:  Cardiology  General surgery    Procedures:  None  Antibiotics: Anti-infectives    Start     Dose/Rate Route Frequency Ordered Stop   10/06/15 0930  cefTRIAXone (ROCEPHIN) 2 g in dextrose 5 % 50 mL IVPB     2 g 100 mL/hr over 30 Minutes Intravenous  Once 10/06/15 0929 10/06/15 1015         HPI/Subjective: Patient resting comfortably after cardiac cath, denies any chest pain or shortness of breath  Objective: Filed Vitals:   10/07/15 0815 10/07/15 0820 10/07/15 0825 10/07/15 0831  BP:    121/73  Pulse: 0 0 59 76  Temp:    98.4 F (36.9 C)  TempSrc:    Oral  Resp: 0 0 0 30  Height:      Weight:      SpO2: 0% 0% 0% 90%    Intake/Output Summary (  Last 24 hours) at 10/07/15 1030 Last data filed at 10/07/15 0600  Gross per 24 hour  Intake 1259.58 ml  Output      0 ml  Net 1259.58 ml    Exam:  General: Well appearing. No resp difficulty. In bed  HEENT: normal Neck: supple. JVP 8-9 . Carotids 2+ bilat; no bruits. No lymphadenopathy or thryomegaly appreciated. Cor: PMI nondisplaced. Regular rate & rhythm. No rubs, gallops or murmurs. Lungs: clear Abdomen: soft, nontender, nondistended. No hepatosplenomegaly. No bruits or masses. Good bowel sounds. Extremities: no cyanosis, clubbing,  rash, R and LLE trace-1+ edema Neuro: alert & orientedx3, cranial nerves grossly intact. moves all 4 extremities w/o difficulty. Affect pleasant  Data Review   Micro Results No results found for this or any previous visit (from the past 240 hour(s)).  Radiology Reports Dg Chest 2 View  10/06/2015  CLINICAL DATA:  Preop for cholelithiasis EXAM: CHEST  2 VIEW COMPARISON:  07/14/2015 FINDINGS: Cardiomegaly again noted. Central mild vascular congestion without convincing pulmonary edema. 3 leads cardiac pacemaker is unchanged in position. Mild basilar atelectasis. No segmental infiltrate. IMPRESSION: Cardiomegaly. Three leads cardiac pacemaker in place. Central mild vascular congestion without pulmonary edema. Mild basilar atelectasis. Electronically Signed   By: Natasha Mead M.D.   On: 10/06/2015 13:23   US Abdomen Limited Ruq  10/06/2015  CLINICAL DATA:  Right upper quadrant pain for 1 day EXAM: US ABDOMEN LIMITED - RIGHT UPPER QUADRANT COMPARISON:  08/10/2014 FINDINGS: Gallbladder: Stable gallstone is again identified. Mild wall thickening is noted to 4 mm. A positive sonographic Eulah Pont sign is seen. Common bile duct: Diameter: 4 mm Liver: No focal lesion identified. Within normal limits in parenchymal echogenicity. Incidental note is again made of a right renal calculus measuring 9 mm. It is nonobstructive in nature. IMPRESSION: Cholelithiasis with positive sonographic Murphy's sign and mild wall thickening. Right renal stone. Electronically Signed   By: Alcide Clever M.D.   On: 10/06/2015 08:52     CBC  Recent Labs Lab 10/06/15 0650 10/07/15 0608  WBC 8.4 13.5*  HGB 15.0 15.5  HCT 46.4 48.0  PLT 175 185  MCV 76.1* 77.5*  MCH 24.6* 25.0*  MCHC 32.3 32.3  RDW 20.1* 18.8*  LYMPHSABS 0.8  --   MONOABS 0.5  --   EOSABS 0.3  --   BASOSABS 0.0  --     Chemistries   Recent Labs Lab 10/06/15 0650 10/06/15 1727 10/07/15 0608  NA 141  --  139  K 5.0  --  4.3  CL 104  --  104  CO2 25   --  23  GLUCOSE 173*  --  140*  BUN 28*  --  27*  CREATININE 1.08  --  1.42*  CALCIUM 9.3  --  8.8*  MG  --  1.7  --   AST 23  --  25  ALT 22  --  17  ALKPHOS 53  --  48  BILITOT 1.2  --  1.4*   ------------------------------------------------------------------------------------------------------------------ estimated creatinine clearance is 71.1 mL/min (by C-G formula based on Cr of 1.42). ------------------------------------------------------------------------------------------------------------------  Recent Labs  10/06/15 1727  HGBA1C 7.3*   ------------------------------------------------------------------------------------------------------------------ No results for input(s): CHOL, HDL, LDLCALC, TRIG, CHOLHDL, LDLDIRECT in the last 72 hours. ------------------------------------------------------------------------------------------------------------------ No results for input(s): TSH, T4TOTAL, T3FREE, THYROIDAB in the last 72 hours.  Invalid input(s): FREET3 ------------------------------------------------------------------------------------------------------------------ No results for input(s): VITAMINB12, FOLATE, FERRITIN, TIBC, IRON, RETICCTPCT in the last 72 hours.  Coagulation profile  Recent Labs  Lab 10/07/15 0608  INR 1.43    No results for input(s): DDIMER in the last 72 hours.  Cardiac Enzymes No results for input(s): CKMB, TROPONINI, MYOGLOBIN in the last 168 hours.  Invalid input(s): CK ------------------------------------------------------------------------------------------------------------------ Invalid input(s): POCBNP   CBG:  Recent Labs Lab 10/06/15 1309 10/06/15 1729 10/06/15 2143 10/07/15 0842  GLUCAP 170* 163* 143* 137*       Studies: Dg Chest 2 View  10/06/2015  CLINICAL DATA:  Preop for cholelithiasis EXAM: CHEST  2 VIEW COMPARISON:  07/14/2015 FINDINGS: Cardiomegaly again noted. Central mild vascular congestion without  convincing pulmonary edema. 3 leads cardiac pacemaker is unchanged in position. Mild basilar atelectasis. No segmental infiltrate. IMPRESSION: Cardiomegaly. Three leads cardiac pacemaker in place. Central mild vascular congestion without pulmonary edema. Mild basilar atelectasis. Electronically Signed   By: Natasha Mead M.D.   On: 10/06/2015 13:23   US Abdomen Limited Ruq  10/06/2015  CLINICAL DATA:  Right upper quadrant pain for 1 day EXAM: US ABDOMEN LIMITED - RIGHT UPPER QUADRANT COMPARISON:  08/10/2014 FINDINGS: Gallbladder: Stable gallstone is again identified. Mild wall thickening is noted to 4 mm. A positive sonographic Eulah Pont sign is seen. Common bile duct: Diameter: 4 mm Liver: No focal lesion identified. Within normal limits in parenchymal echogenicity. Incidental note is again made of a right renal calculus measuring 9 mm. It is nonobstructive in nature. IMPRESSION: Cholelithiasis with positive sonographic Murphy's sign and mild wall thickening. Right renal stone. Electronically Signed   By: Alcide Clever M.D.   On: 10/06/2015 08:52      Lab Results  Component Value Date   HGBA1C 7.3* 10/06/2015   HGBA1C 8.0 03/27/2015   HGBA1C 7.4 07/22/2014   Lab Results  Component Value Date   LDLCALC 120 03/27/2015   CREATININE 1.42* 10/07/2015       Scheduled Meds: . [START ON 10/08/2015] aspirin  81 mg Oral Daily  . carvedilol  12.5 mg Oral BID WC  . furosemide  80 mg Intravenous BID  . insulin aspart  0-20 Units Subcutaneous TID WC  . insulin aspart  0-5 Units Subcutaneous QHS  . magnesium sulfate 1 - 4 g bolus IVPB  2 g Intravenous Once  . pregabalin  150 mg Oral QHS  . rosuvastatin  40 mg Oral q1800  . sodium chloride flush  3 mL Intravenous Q12H  . sodium chloride flush  3 mL Intravenous Q12H   Continuous Infusions: . sodium chloride 1 mL/kg/hr (10/07/15 0910)    Active Problems:   Essential hypertension   DM (diabetes mellitus) type 2, uncontrolled, with ketoacidosis (HCC)    Cholecystitis   Neuropathy (HCC)   Acute on chronic systolic heart failure (HCC)   Preoperative clearance   Preop cardiovascular exam    Time spent: 45 minutes   [facility]  [facility] Pager [phone]. If 7PM-7AM, please contact night-coverage at [email or web address], password [facility] 10/07/2015, 10:30 AM  LOS: 1 day

## 2015-10-07 NOTE — H&P (View-Only) (Signed)
Cardiologist:  Lovena Le.  Previously seen by Dr. Verl Blalock Reason for Consult: Pre-op clearance Referring Physician:  Anibal Henderson is an 55 y.o. male.  HPI:   Patient is a 55 year old male with history of nonischemic cardiomyopathy hypertension, diabetes mellitus, hyperlipidemia, chronic systolic heart failure. He had a [employer]. Jude bi-V ICD implanted October 2016.  His last echocardiogram was August 2016 ejection fraction was 20% with diffuse hypokinesis. Mild LVH. Mild MR. Left atrium severely dilated.  He was seen on February 16 Dr. Lovena Le at that time he admitted to both dietary and medical noncompliance. His HCTZ was stopped and he was started on Lasix.   His St. Jude device was working normally at that time.   Patient was admitted with acute cholecystitis.  He is A sensing V pacing on his EKG.  Patient reports developing right upper quadrant abdominal pain early this morning with nausea, vomiting. He has a history of cholelithiasis and figured it was his gallbladder.  Patient sleeps on 3 pillows because he breathes much better.   He gets short of breath with minimal exertion. He does not weigh himself on a regular basis.  His weight in the office was 244 pounds and is currently 238.  The patient currently denies  fever, chest pain, dizziness, cough, congestion, abdominal pain, hematochezia, melena, lower extremity edema, claudication.   Past Medical History  Diagnosis Date  . Hypertension   . Diabetes mellitus without complication ([place])   . High cholesterol   . Kidney stone   . Cholelithiasis   . Chronic systolic heart failure [facility])     Past Surgical History  Procedure Laterality Date  . Colonoscopy N/A 07/12/2013    Procedure: COLONOSCOPY;  Surgeon: Lear Ng, MD;  Location: [facility];  Service: Endoscopy;  Laterality: N/A;  . Ep implantable device N/A 05/09/2015    Procedure: BiV ICD Insertion CRT-D;  Surgeon: Evans Lance, MD;  Location: [place] [facility];   Service: Cardiovascular;  Laterality: N/A;    Family History  Problem Relation Age of Onset  . Diabetes Mother   . Hypertension Mother   . Hyperlipidemia Mother   . Stroke Mother   . Diabetes Father   . Hypertension Father   . Stroke Father 22    Social History:  reports that he has never smoked. He does not have any smokeless tobacco history on file. He reports that he does not drink alcohol or use illicit drugs.  Allergies: No Known Allergies  Medications: Medication Sig  aspirin EC 81 MG tablet Take 81 mg by mouth daily.  benzonatate (TESSALON) 100 MG capsule Take 1 capsule (100 mg total) by mouth every 8 (eight) hours.  carvedilol (COREG) 25 MG tablet Take 1 tablet (25 mg total) by mouth 2 (two) times daily with a meal.  furosemide (LASIX) 20 MG tablet Take 1 tablet (20 mg total) by mouth daily.  insulin aspart (NOVOLOG FLEXPEN) 100 UNIT/ML FlexPen Inject 10 Units into the skin 2 (two) times daily with a meal.  insulin glargine (LANTUS) 100 UNIT/ML injection Inject 0.3 mLs (30 Units total) into the skin at bedtime.  insulin glargine (LANTUS) 100 unit/mL SOPN Inject 30 Units into the skin at bedtime.  metFORMIN (GLUCOPHAGE) 1000 MG tablet Take 1 tablet (1,000 mg total) by mouth 2 (two) times daily with a meal.  olmesartan (BENICAR) 20 MG tablet Take 1 tablet (20 mg total) by mouth daily.  pregabalin (LYRICA) 75 MG capsule Take 150 mg by  mouth at bedtime.  rosuvastatin (CRESTOR) 20 MG tablet Take 1 tablet (20 mg total) by mouth daily.     Results for orders placed or performed during the hospital encounter of 10/06/15 (from the past 48 hour(s))  Comprehensive metabolic panel     Status: Abnormal   Collection Time: 10/06/15  6:50 AM  Result Value Ref Range   Sodium 141 135 - 145 mmol/L   Potassium 5.0 3.5 - 5.1 mmol/L   Chloride 104 101 - 111 mmol/L   CO2 25 22 - 32 mmol/L   Glucose, Bld 173 (H) 65 - 99 mg/dL   BUN 28 (H) 6 - 20 mg/dL   Creatinine, Ser 1.08 0.61 - 1.24  mg/dL   Calcium 9.3 8.9 - 10.3 mg/dL   Total Protein 6.8 6.5 - 8.1 g/dL   Albumin 4.0 3.5 - 5.0 g/dL   AST 23 15 - 41 U/L   ALT 22 17 - 63 U/L   Alkaline Phosphatase 53 38 - 126 U/L   Total Bilirubin 1.2 0.3 - 1.2 mg/dL   GFR calc non Af Amer >60 >60 mL/min   GFR calc Af Amer >60 >60 mL/min    Comment: (NOTE) The eGFR has been calculated using the CKD EPI equation. This calculation has not been validated in all clinical situations. eGFR's persistently <60 mL/min signify possible Chronic Kidney Disease.    Anion gap 12 5 - 15  Lipase, blood     Status: None   Collection Time: 10/06/15  6:50 AM  Result Value Ref Range   Lipase 22 11 - 51 U/L  CBC with Differential/Platelet     Status: Abnormal   Collection Time: 10/06/15  6:50 AM  Result Value Ref Range   WBC 8.4 4.0 - 10.5 K/uL   RBC 6.10 (H) 4.22 - 5.81 MIL/uL   Hemoglobin 15.0 13.0 - 17.0 g/dL   HCT 46.4 39.0 - 52.0 %   MCV 76.1 (L) 78.0 - 100.0 fL   MCH 24.6 (L) 26.0 - 34.0 pg   MCHC 32.3 30.0 - 36.0 g/dL   RDW 20.1 (H) 11.5 - 15.5 %   Platelets 175 150 - 400 K/uL    Comment: SPECIMEN CHECKED FOR CLOTS PLATELET COUNT CONFIRMED BY SMEAR    Neutrophils Relative % 82 %   Lymphocytes Relative 9 %   Monocytes Relative 6 %   Eosinophils Relative 3 %   Basophils Relative 0 %   Neutro Abs 6.8 1.7 - 7.7 K/uL   Lymphs Abs 0.8 0.7 - 4.0 K/uL   Monocytes Absolute 0.5 0.1 - 1.0 K/uL   Eosinophils Absolute 0.3 0.0 - 0.7 K/uL   Basophils Absolute 0.0 0.0 - 0.1 K/uL   RBC Morphology ELLIPTOCYTES     Comment: BURR CELLS   Smear Review LARGE PLATELETS PRESENT     Comment: PLATELETS APPEAR ADEQUATE  Urinalysis, Routine w reflex microscopic (not at [facility])     Status: Abnormal   Collection Time: 10/06/15  9:20 AM  Result Value Ref Range   Color, Urine AMBER (A) YELLOW    Comment: BIOCHEMICALS MAY BE AFFECTED BY COLOR   APPearance CLEAR CLEAR   Specific Gravity, Urine 1.029 1.005 - 1.030   pH 6.0 5.0 - 8.0   Glucose, UA NEGATIVE  NEGATIVE mg/dL   Hgb urine dipstick TRACE (A) NEGATIVE   Bilirubin Urine NEGATIVE NEGATIVE   Ketones, ur 15 (A) NEGATIVE mg/dL   Protein, ur >300 (A) NEGATIVE mg/dL   Nitrite NEGATIVE NEGATIVE  Leukocytes, UA NEGATIVE NEGATIVE  Urine microscopic-add on     Status: Abnormal   Collection Time: 10/06/15  9:20 AM  Result Value Ref Range   Squamous Epithelial / LPF 0-5 (A) NONE SEEN   WBC, UA 0-5 0 - 5 WBC/hpf   RBC / HPF 0-5 0 - 5 RBC/hpf   Bacteria, UA RARE (A) NONE SEEN   Urine-Other MUCOUS PRESENT   CBG monitoring, ED     Status: Abnormal   Collection Time: 10/06/15  1:09 PM  Result Value Ref Range   Glucose-Capillary 170 (H) 65 - 99 mg/dL  Glucose, capillary     Status: Abnormal   Collection Time: 10/06/15  5:29 PM  Result Value Ref Range   Glucose-Capillary 163 (H) 65 - 99 mg/dL    Dg Chest 2 View  10/06/2015  CLINICAL DATA:  Preop for cholelithiasis EXAM: CHEST  2 VIEW COMPARISON:  07/14/2015 FINDINGS: Cardiomegaly again noted. Central mild vascular congestion without convincing pulmonary edema. 3 leads cardiac pacemaker is unchanged in position. Mild basilar atelectasis. No segmental infiltrate. IMPRESSION: Cardiomegaly. Three leads cardiac pacemaker in place. Central mild vascular congestion without pulmonary edema. Mild basilar atelectasis. Electronically Signed   By: Lahoma Crocker M.D.   On: 10/06/2015 13:23   US Abdomen Limited Ruq  10/06/2015  CLINICAL DATA:  Right upper quadrant pain for 1 day EXAM: US ABDOMEN LIMITED - RIGHT UPPER QUADRANT COMPARISON:  08/10/2014 FINDINGS: Gallbladder: Stable gallstone is again identified. Mild wall thickening is noted to 4 mm. A positive sonographic Percell Miller sign is seen. Common bile duct: Diameter: 4 mm Liver: No focal lesion identified. Within normal limits in parenchymal echogenicity. Incidental note is again made of a right renal calculus measuring 9 mm. It is nonobstructive in nature. IMPRESSION: Cholelithiasis with positive sonographic  Murphy's sign and mild wall thickening. Right renal stone. Electronically Signed   By: Inez Catalina M.D.   On: 10/06/2015 08:52    Review of Systems  Constitutional: Negative for fever and chills.  HENT: Negative for congestion.   Respiratory: Positive for shortness of breath (With exertion ). Negative for cough.   Cardiovascular: Positive for orthopnea and PND. Negative for chest pain and leg swelling.  Gastrointestinal: Positive for nausea, vomiting and abdominal pain.  Genitourinary: Negative for hematuria.  Musculoskeletal: Negative for myalgias.  Neurological: Negative for dizziness.  All other systems reviewed and are negative.  Blood pressure 133/79, pulse 78, temperature 99.3 F (37.4 C), temperature source Oral, resp. rate 18, height _0  (1.702 m), weight 238 lb (107.956 kg), SpO2 95 %. Physical Exam  Nursing note and vitals reviewed. Constitutional: He is oriented to person, place, and time. He appears well-developed. No distress.  Obese  HENT:  Head: Normocephalic and atraumatic.  Eyes: EOM are normal. Pupils are equal, round, and reactive to light. No scleral icterus.  Neck: Normal range of motion. Neck supple. JVD present.  Cardiovascular: Normal rate, regular rhythm, S1 normal and S2 normal.  Exam reveals gallop and S3.   No murmur heard. Pulses:      Radial pulses are 2+ on the right side, and 2+ on the left side.       Dorsalis pedis pulses are 2+ on the right side, and 2+ on the left side.  Respiratory: Effort normal. He has no wheezes. He has rales.  GI: Soft. Bowel sounds are normal.  Musculoskeletal: He exhibits edema.  Trace LEE   Lymphadenopathy:    He has no cervical adenopathy.  Neurological: He  is alert and oriented to person, place, and time. He exhibits normal muscle tone.  Skin: Skin is warm and dry.  Psychiatric: He has a normal mood and affect.    Assessment/Plan: Active Problems:   Essential hypertension   DM (diabetes mellitus) type 2,  uncontrolled, with ketoacidosis (HCC)   Cholecystitis   Neuropathy (HCC)   Acute on chronic systolic heart failure [facility])   Preoperative clearance  55 year old male with history of nonischemic cardiomyopathy hypertension, diabetes mellitus, hyperlipidemia, chronic systolic heart failure. He had a St. Jude bi-V ICD implanted October 2016.  His last echocardiogram was August 2016 ejection fraction was 20% with diffuse hypokinesis.  Patient presents with abdominal pain over the last couple of weeks and he has been struggling with orthopnea. His hydrochlorothiazide was recently discontinued and he was started on 20 mg of Lasix. He appears to be acutely volume overloaded right now based on his chest x-ray and exam although not significantly.   I ordered a BNP.   I'll give him a dose of IV Lasix 40 mg.    We are also seeing him for preoperative clearance for cholecystectomy.  MD to opinion to follow.      Tarri Fuller, [place] 10/06/2015, 5:44 PM  As above, patient seen and examined. Briefly he is a 55 year old male with past medical history of cardiomyopathy, congestive heart failure, hypertension, diabetes mellitus, hyperlipidemia, biventricular ICD for preoperative evaluation prior to cholecystectomy. Patient has a long history of cardiomyopathy but has never had an ischemia evaluation since development of LV dysfunction. His last echocardiogram in August 2016 showed an ejection fraction of 20% and mild mitral regurgitation. Patient has chronic dyspnea on exertion. He cannot climb one flight of stairs without having dyspnea. There is mild orthopnea but no PND, pedal edema, exertional chest pain or syncope. He has developed cholecystitis and required cholecystectomy. Cardiology asked to evaluate preoperatively. Electrocardiogram shows sinus rhythm with ventricular pacing. 1 cardiomyopathy-continue beta blocker and ARB. The etiology of his cardiomyopathy is not clear. He has not had an ischemia evaluation since  it was diagnosed. He has greater than 20 years of diabetes mellitus, hypertension, hyperlipidemia and a family history of coronary disease. I think definitive evaluation is warranted. We will arrange a cardiac catheterization. The risks and benefits were discussed and he agrees to proceed. Add aspirin 81 mg daily. 2 congestive heart failure-patient appears to be euvolemic. Continue present dose of Lasix. He is being hydrated since he is NPO. Watch volume status closely.  3 preoperative evaluation prior to cholecystectomy-as outlined above I think the patient needs definitive evaluation for his cardiomyopathy. He has significant dyspnea on exertion which could also be an anginal equivalent. Plan to proceed with cardiac catheterization. If no significant coronary disease he may proceed with cholecystectomy afterwards. 4 hypertension-continue present medications. 5 hyperlipidemia-Resume statin at discharge. Kirk Ruths

## 2015-10-07 NOTE — Progress Notes (Signed)
Peripherally Inserted Central Catheter/Midline Placement  The IV Nurse has discussed with the patient and/or persons authorized to consent for the patient, the purpose of this procedure and the potential benefits and risks involved with this procedure.  The benefits include less needle sticks, lab draws from the catheter and patient may be discharged home with the catheter.  Risks include, but not limited to, infection, bleeding, blood clot (thrombus formation), and puncture of an artery; nerve damage and irregular heat beat.  Alternatives to this procedure were also discussed.  PICC/Midline Placement Documentation        Lisabeth Devoid 10/07/2015, 3:16 PM

## 2015-10-07 NOTE — Progress Notes (Signed)
Subjective: Stable and alert.  Abdominal pain essentially has resolved. History reviewed.  This is not his first episode of biliary colic and cholecystitis. Ultrasound shows gallstone.  Wall thickening borderline at 4 mm.  CBD 4 mm not dilated. CBC normal.  LFTs normal.  Lipase normal.  BNP elevated 1374. No prior history of abdominal surgery  Cardiology actively involved because of cardiomyopathy, congestive heart failure, hypertension, biventricular ICD.  Last echo August 2016 showed ejection fraction 20%.  Chronic dyspnea on exertion.  Cardiology plans cardiac catheterization to risk stratify for cholecystectomy.  Would continue bowel rest and IV antibiotics as primary therapy for cholecystitis, pending cardiology evaluation. Ideally we would proceed with cholecystectomy this admission. If, from a cardiac standpoint, he could not tolerate general anesthesia, the other options are antibiotics alone or antibiotics plus percutaneous cholecystostomy.  Since he is nontender this morning there is no reason to rush that decision.   Objective: Vital signs in last 24 hours: Temp:  [97.6 F (36.4 C)-100 F (37.8 C)] 100 F (37.8 C) (03/06 2145) Pulse Rate:  [61-82] 82 (03/06 2145) Resp:  [16-18] 16 (03/06 2145) BP: (117-149)/(72-108) 117/72 mmHg (03/06 2145) SpO2:  [85 %-100 %] 100 % (03/06 2145) Weight:  [107.956 kg (238 lb)] 107.956 kg (238 lb) (03/06 0616) Last BM Date: 10/06/15  Intake/Output from previous day: 03/06 0701 - 03/07 0700 In: 708.8 [I.V.:658.8; IV Piggyback:50] Out: -  Intake/Output this shift:    General appearance: Pleasant.  Alert.  Cooperative.  Appears fatigued but in no physical distress.  Denies pain GI: Abdomen soft and nontender.  No mass.  No scars.  No hernias.  Lab Results:   Recent Labs  10/06/15 0650  WBC 8.4  HGB 15.0  HCT 46.4  PLT 175   BMET  Recent Labs  10/06/15 0650  NA 141  K 5.0  CL 104  CO2 25  GLUCOSE 173*  BUN 28*   CREATININE 1.08  CALCIUM 9.3   PT/INR No results for input(s): LABPROT, INR in the last 72 hours. ABG No results for input(s): PHART, HCO3 in the last 72 hours.  Invalid input(s): PCO2, PO2  Studies/Results: Dg Chest 2 View  10/06/2015  CLINICAL DATA:  Preop for cholelithiasis EXAM: CHEST  2 VIEW COMPARISON:  07/14/2015 FINDINGS: Cardiomegaly again noted. Central mild vascular congestion without convincing pulmonary edema. 3 leads cardiac pacemaker is unchanged in position. Mild basilar atelectasis. No segmental infiltrate. IMPRESSION: Cardiomegaly. Three leads cardiac pacemaker in place. Central mild vascular congestion without pulmonary edema. Mild basilar atelectasis. Electronically Signed   By: Natasha Mead M.D.   On: 10/06/2015 13:23   US Abdomen Limited Ruq  10/06/2015  CLINICAL DATA:  Right upper quadrant pain for 1 day EXAM: US ABDOMEN LIMITED - RIGHT UPPER QUADRANT COMPARISON:  08/10/2014 FINDINGS: Gallbladder: Stable gallstone is again identified. Mild wall thickening is noted to 4 mm. A positive sonographic Eulah Pont sign is seen. Common bile duct: Diameter: 4 mm Liver: No focal lesion identified. Within normal limits in parenchymal echogenicity. Incidental note is again made of a right renal calculus measuring 9 mm. It is nonobstructive in nature. IMPRESSION: Cholelithiasis with positive sonographic Murphy's sign and mild wall thickening. Right renal stone. Electronically Signed   By: Alcide Clever M.D.   On: 10/06/2015 08:52    Anti-infectives: Anti-infectives    Start     Dose/Rate Route Frequency Ordered Stop   10/06/15 0930  cefTRIAXone (ROCEPHIN) 2 g in dextrose 5 % 50 mL IVPB  2 g 100 mL/hr over 30 Minutes Intravenous  Once 10/06/15 1610 10/06/15 1015      Assessment/Plan: s/p Procedure(s): Left Heart Cath and Coronary Angiography  Cholelithiasis, biliary colic, possible mild acute cholecystitis. Currently asymptomatic on antibiotics and bowel rest Await cardiac  evaluation and recommendations to see if OK for surgery.  Cardiomyopathy, congestive heart failure, hypertension, biventricular ICD. Apparently for cardiac catheterization today  Diabetes mellitus without complication.   LOS: 1 day    Ernestene Mention 10/07/2015

## 2015-10-07 NOTE — Progress Notes (Signed)
TR BAND REMOVAL  LOCATION:    right radial  DEFLATED PER PROTOCOL:    Yes.    TIME BAND OFF / DRESSING APPLIED:    1300   SITE UPON ARRIVAL:    Level 0  SITE AFTER BAND REMOVAL:    Level 0  CIRCULATION SENSATION AND MOVEMENT:    Within Normal Limits   Yes.    COMMENTS:   Tolerated procedure well, post TRB instructions given

## 2015-10-07 NOTE — Interval H&P Note (Signed)
Cath Lab Visit (complete for each Cath Lab visit)  Clinical Evaluation Leading to the Procedure:   ACS: No.  Non-ACS:    Anginal Classification: CCS IV  Anti-ischemic medical therapy: Minimal Therapy (1 class of medications)  Non-Invasive Test Results: No non-invasive testing performed  Prior CABG: No previous CABG      History and Physical Interval Note:  10/07/2015 7:24 AM  Benjamin [name]  has presented today for surgery, with the diagnosis of unstable angina  The various methods of treatment have been discussed with the patient and family. After consideration of risks, benefits and other options for treatment, the patient has consented to  Procedure(s): Left Heart Cath and Coronary Angiography (N/A) as a surgical intervention .  The patient's history has been reviewed, patient examined, no change in status, stable for surgery.  I have reviewed the patient's chart and labs.  Questions were answered to the patient's satisfaction.     Lesleigh Noe

## 2015-10-07 NOTE — Progress Notes (Signed)
Advanced Heart Failure Rounding Note   Subjective:   55 year old male with history of NICM, hypertension, diabetes mellitus, hyperlipidemia, chronic systolic heart failure. He had a [employer]. Jude bi-V ICD implanted October 2016. His last echocardiogram was August 2016 ejection fraction was 20% with diffuse hypokinesis. Mild LVH. Mild MR. Left atrium severely dilated admitted with RUQ abdominal pain. Marland Kitchen   He was seen on September 18, 2015.  Dr. Ladona Ridgel at that time he admitted to both dietary and medical noncompliance. His HCTZ was stopped and he was started on Lasix. Progressive dyspnea with exertion over the last year. SOB with steps. Increased dyspnea when carrying items. + Orthopnea. He continues to work full time.   Today he denies SOB/CP   LHC earlier today-  1. Prox RCA to Mid RCA lesion, 20% stenosed. 2. Dist RCA lesion, 90% stenosed. 3. RPDA lesion, 90% stenosed. 4. 1st Diag lesion, 100% stenosed. 5. Mid LAD lesion, 95% stenosed. 6. Dist LAD lesion, 90% stenosed. 7. Ost Cx lesion, 60% stenosed. 8. 2nd Mrg lesion, 95% stenosed. 9. Prox Cx to Mid Cx lesion, 60% stenosed.    Objective:   Weight Range:  Vital Signs:   Temp:  [98.4 F (36.9 C)-100 F (37.8 C)] 98.4 F (36.9 C) (03/07 0831) Pulse Rate:  [0-100] 76 (03/07 0831) Resp:  [0-45] 30 (03/07 0831) BP: (98-149)/(64-100) 121/73 mmHg (03/07 0831) SpO2:  [0 %-100 %] 90 % (03/07 0831) Weight:  [242 lb (109.77 kg)] 242 lb (109.77 kg) (03/07 0606) Last BM Date: 10/06/15  Weight change: Filed Weights   10/06/15 0616 10/07/15 0606  Weight: 238 lb (107.956 kg) 242 lb (109.77 kg)    Intake/Output:   Intake/Output Summary (Last 24 hours) at 10/07/15 0950 Last data filed at 10/07/15 0600  Gross per 24 hour  Intake 1259.58 ml  Output      0 ml  Net 1259.58 ml     Physical Exam: General:  Well appearing. No resp difficulty. In bed  HEENT: normal Neck: supple. JVP 8-9  . Carotids 2+ bilat; no bruits. No  lymphadenopathy or thryomegaly appreciated. Cor: PMI nondisplaced. Regular rate & rhythm. No rubs, gallops or murmurs. Lungs: clear Abdomen: soft, nontender, nondistended. No hepatosplenomegaly. No bruits or masses. Good bowel sounds. Extremities: no cyanosis, clubbing, rash, R and LLE trace-1+ edema Neuro: alert & orientedx3, cranial nerves grossly intact. moves all 4 extremities w/o difficulty. Affect pleasant  Telemetry: NSR 70s   Labs: Basic Metabolic Panel:  Recent Labs Lab 10/06/15 0650 10/06/15 1727 10/07/15 0608  NA 141  --  139  K 5.0  --  4.3  CL 104  --  104  CO2 25  --  23  GLUCOSE 173*  --  140*  BUN 28*  --  27*  CREATININE 1.08  --  1.42*  CALCIUM 9.3  --  8.8*  MG  --  1.7  --   PHOS  --  4.7*  --     Liver Function Tests:  Recent Labs Lab 10/06/15 0650 10/07/15 0608  AST 23 25  ALT 22 17  ALKPHOS 53 48  BILITOT 1.2 1.4*  PROT 6.8 6.3*  ALBUMIN 4.0 3.3*    Recent Labs Lab 10/06/15 0650 10/07/15 0608  LIPASE 22 20   No results for input(s): AMMONIA in the last 168 hours.  CBC:  Recent Labs Lab 10/06/15 0650 10/07/15 0608  WBC 8.4 13.5*  NEUTROABS 6.8  --   HGB 15.0 15.5  HCT 46.4 48.0  MCV 76.1* 77.5*  PLT 175 185    Cardiac Enzymes: No results for input(s): CKTOTAL, CKMB, CKMBINDEX, TROPONINI in the last 168 hours.  BNP: BNP (last 3 results)  Recent Labs  10/06/15 1939  BNP 1374.4*    ProBNP (last 3 results) No results for input(s): PROBNP in the last 8760 hours.    Other results:  Imaging: Dg Chest 2 View  10/06/2015  CLINICAL DATA:  Preop for cholelithiasis EXAM: CHEST  2 VIEW COMPARISON:  07/14/2015 FINDINGS: Cardiomegaly again noted. Central mild vascular congestion without convincing pulmonary edema. 3 leads cardiac pacemaker is unchanged in position. Mild basilar atelectasis. No segmental infiltrate. IMPRESSION: Cardiomegaly. Three leads cardiac pacemaker in place. Central mild vascular congestion without  pulmonary edema. Mild basilar atelectasis. Electronically Signed   By: Natasha Mead M.D.   On: 10/06/2015 13:23   US Abdomen Limited Ruq  10/06/2015  CLINICAL DATA:  Right upper quadrant pain for 1 day EXAM: US ABDOMEN LIMITED - RIGHT UPPER QUADRANT COMPARISON:  08/10/2014 FINDINGS: Gallbladder: Stable gallstone is again identified. Mild wall thickening is noted to 4 mm. A positive sonographic Eulah Pont sign is seen. Common bile duct: Diameter: 4 mm Liver: No focal lesion identified. Within normal limits in parenchymal echogenicity. Incidental note is again made of a right renal calculus measuring 9 mm. It is nonobstructive in nature. IMPRESSION: Cholelithiasis with positive sonographic Murphy's sign and mild wall thickening. Right renal stone. Electronically Signed   By: Alcide Clever M.D.   On: 10/06/2015 08:52      Medications:     Scheduled Medications: . [START ON 10/08/2015] aspirin  81 mg Oral Daily  . carvedilol  12.5 mg Oral BID WC  . furosemide  80 mg Intravenous BID  . furosemide  20 mg Oral Daily  . insulin aspart  0-20 Units Subcutaneous TID WC  . insulin aspart  0-5 Units Subcutaneous QHS  . pregabalin  150 mg Oral QHS  . sodium chloride flush  3 mL Intravenous Q12H  . sodium chloride flush  3 mL Intravenous Q12H     Infusions: . sodium chloride 1 mL/kg/hr (10/07/15 0910)     PRN Medications:  sodium chloride, [DISCONTINUED] acetaminophen **OR** acetaminophen, acetaminophen, morphine injection, ondansetron (ZOFRAN) IV, ondansetron **OR** [DISCONTINUED] ondansetron (ZOFRAN) IV, oxyCODONE-acetaminophen, sodium chloride flush   Assessment:   1. Acute cholecystitis 2.CAD- Multivessel CAD 3. A/C Systolic Heart Failure 4. HTN 5. Hyperlipidemia   Plan/Discussion:   Plan to discuss with surgery. Hold off on cholecystectomy for now. Will tune up first. . Place PICC and check CO-OX. Check CVP to guide diuresis.   Cath today with multivessel CAD. Unable to perform MRI with  ICD. Revascularize down the road.   Volume status elevated. Diurese with IV lasix 80 mg twice a day. Cut back carvedilol to 12.5 mg twice a day. Hold benicar for now with slight elevation in creatinine. Will likely switch to entresto.   Continue aspirin, bb, and crestor.   Transfer to stepdown.  Length of Stay: 1   Amy Clegg NP-C  10/07/2015, 9:50 AM   Patient seen and examined with Tonye Becket, NP. We discussed all aspects of the encounter. I agree with the assessment and plan as stated above.  Advanced Heart Failure Team Pager [phone] (M-F; 7a - 4p)  Please contact [facility] for night-coverage after hours (4p -7a ) and weekends on [email or web address]  Patient seen and examined with Tonye Becket, NP. We discussed all aspects of the encounter. I agree with the  assessment and plan as stated above.   Difficult situation. Clearly seems to have had acute cholecystitis as presenting symptom with GB inflammation on RUQ u/s. Also has had class II-III HF with recent volume overload exacerbation. Cath with severe 2V CAD in the LAD and RCA. Currently not having any angina. I have discussed his case with Dr. Derrell Lolling from [facility] and our interventional team. Given the high likelihood that he will have recurrent biliary colic and the fact that PCI of his coronaries will do little to reduce his operative risk in the setting of diffuse distal CAD, I think the best plan is to optimize his HF and let him proceed with lap chole later this week acknowledging a moderate to high peri-operative risk of CV complications. Once he is s/p cholecystectomy we can let him go home and bring him back for attempt at 2v PCI. Will move to SDU and continue IV diuresis and medical optimization of his HF regimen.   Bensimhon, Daniel,MD 11:14 AM

## 2015-10-08 DIAGNOSIS — I2510 Atherosclerotic heart disease of native coronary artery without angina pectoris: Secondary | ICD-10-CM

## 2015-10-08 DIAGNOSIS — I5023 Acute on chronic systolic (congestive) heart failure: Secondary | ICD-10-CM | POA: Diagnosis not present

## 2015-10-08 DIAGNOSIS — E1310 Other specified diabetes mellitus with ketoacidosis without coma: Secondary | ICD-10-CM | POA: Diagnosis not present

## 2015-10-08 DIAGNOSIS — K819 Cholecystitis, unspecified: Secondary | ICD-10-CM | POA: Diagnosis not present

## 2015-10-08 LAB — CBC
HCT: 43 % (ref 39.0–52.0)
HEMOGLOBIN: 13.9 g/dL (ref 13.0–17.0)
MCH: 24.6 pg — AB (ref 26.0–34.0)
MCHC: 32.3 g/dL (ref 30.0–36.0)
MCV: 76.1 fL — AB (ref 78.0–100.0)
PLATELETS: 151 10*3/uL (ref 150–400)
RBC: 5.65 MIL/uL (ref 4.22–5.81)
RDW: 18.4 % — ABNORMAL HIGH (ref 11.5–15.5)
WBC: 11.1 10*3/uL — ABNORMAL HIGH (ref 4.0–10.5)

## 2015-10-08 LAB — GLUCOSE, CAPILLARY
GLUCOSE-CAPILLARY: 258 mg/dL — AB (ref 65–99)
GLUCOSE-CAPILLARY: 138 mg/dL — AB (ref 65–99)
GLUCOSE-CAPILLARY: 156 mg/dL — AB (ref 65–99)
GLUCOSE-CAPILLARY: 125 mg/dL — AB (ref 65–99)

## 2015-10-08 LAB — COMPREHENSIVE METABOLIC PANEL
ALK PHOS: 40 U/L (ref 38–126)
ALT: 15 U/L — AB (ref 17–63)
ANION GAP: 11 (ref 5–15)
AST: 19 U/L (ref 15–41)
Albumin: 2.8 g/dL — ABNORMAL LOW (ref 3.5–5.0)
BUN: 30 mg/dL — ABNORMAL HIGH (ref 6–20)
CALCIUM: 8.2 mg/dL — AB (ref 8.9–10.3)
CO2: 25 mmol/L (ref 22–32)
CREATININE: 1.16 mg/dL (ref 0.61–1.24)
Chloride: 102 mmol/L (ref 101–111)
Glucose, Bld: 169 mg/dL — ABNORMAL HIGH (ref 65–99)
Potassium: 4 mmol/L (ref 3.5–5.1)
Sodium: 138 mmol/L (ref 135–145)
Total Bilirubin: 0.8 mg/dL (ref 0.3–1.2)
Total Protein: 5.4 g/dL — ABNORMAL LOW (ref 6.5–8.1)

## 2015-10-08 LAB — CARBOXYHEMOGLOBIN
CARBOXYHEMOGLOBIN: 1.2 % (ref 0.5–1.5)
Carboxyhemoglobin: 1.7 % — ABNORMAL HIGH (ref 0.5–1.5)
METHEMOGLOBIN: 0.8 % (ref 0.0–1.5)
Methemoglobin: 0.7 % (ref 0.0–1.5)
O2 Saturation: 51.5 %
O2 Saturation: 57.4 %
TOTAL HEMOGLOBIN: 14.5 g/dL (ref 13.5–18.0)
Total hemoglobin: 12.6 g/dL — ABNORMAL LOW (ref 13.5–18.0)

## 2015-10-08 LAB — MAGNESIUM: Magnesium: 2 mg/dL (ref 1.7–2.4)

## 2015-10-08 LAB — MRSA PCR SCREENING: MRSA BY PCR: NEGATIVE

## 2015-10-08 MED ORDER — SPIRONOLACTONE 25 MG PO TABS: 12.5000 mg | ORAL_TABLET | Freq: Every day | ORAL | Status: DC

## 2015-10-08 MED ORDER — PIPERACILLIN-TAZOBACTAM 3.375 G IVPB
3.3750 g | Freq: Three times a day (TID) | INTRAVENOUS | Status: DC
  Administered 2015-10-08 – 2015-10-09 (×5): 3.375 g via INTRAVENOUS
  Filled 2015-10-08 (×8): qty 50

## 2015-10-08 MED ORDER — INSULIN GLARGINE 100 UNIT/ML ~~LOC~~ SOLN
20.0000 [IU] | Freq: Every day | SUBCUTANEOUS | Status: DC
  Administered 2015-10-08: 20 [IU] via SUBCUTANEOUS
  Filled 2015-10-08 (×2): qty 0.2

## 2015-10-08 NOTE — Progress Notes (Signed)
Advanced Heart Failure Rounding Note   Subjective:   55 year old male with history of NICM, hypertension, diabetes mellitus, hyperlipidemia, chronic systolic heart failure. He had a [employer]. Jude bi-V ICD implanted October 2016. His last echocardiogram was August 2016 ejection fraction was 20% with diffuse hypokinesis. Mild LVH. Mild MR. Left atrium severely dilated admitted with RUQ abdominal pain. Benjamin [name]  He was seen on September 18, 2015.  Dr. Ladona Ridgel at that time he admitted to both dietary and medical noncompliance. His HCTZ was stopped and he was started on Lasix. Progressive dyspnea with exertion over the last year. SOB with steps. Increased dyspnea when carrying items. + Orthopnea. He continues to work full time.   Yesterday he was diuresed with IV lasix and carvedilol was cut back. I weighted him personally. Weight down 7 pounds. Denies SOB/CP.    LHC  1. Prox RCA to Mid RCA lesion, 20% stenosed. 2. Dist RCA lesion, 90% stenosed. 3. RPDA lesion, 90% stenosed. 4. 1st Diag lesion, 100% stenosed. 5. Mid LAD lesion, 95% stenosed. 6. Dist LAD lesion, 90% stenosed. 7. Ost Cx lesion, 60% stenosed. 8. 2nd Mrg lesion, 95% stenosed. 9. Prox Cx to Mid Cx lesion, 60% stenosed.    Objective:   Weight Range:  Vital Signs:   Temp:  [97 F (36.1 C)-99.2 F (37.3 C)] 98.9 F (37.2 C) (03/08 0300) Pulse Rate:  [0-79] 69 (03/08 0400) Resp:  [0-45] 21 (03/08 0400) BP: (91-148)/(56-84) 120/68 mmHg (03/08 0400) SpO2:  [0 %-99 %] 87 % (03/08 0400) Weight:  [248 lb 3.8 oz (112.6 kg)-248 lb 4.8 oz (112.628 kg)] 248 lb 3.8 oz (112.6 kg) (03/08 0209) Last BM Date: 10/06/15  Weight change: Filed Weights   10/07/15 0606 10/07/15 2100 10/08/15 0209  Weight: 242 lb (109.77 kg) 248 lb 4.8 oz (112.628 kg) 248 lb 3.8 oz (112.6 kg)    Intake/Output:   Intake/Output Summary (Last 24 hours) at 10/08/15 0747 Last data filed at 10/08/15 0641  Gross per 24 hour  Intake 923.45 ml  Output   2425 ml  Net  -1501.55 ml     Physical Exam: CVP 2-3  General:  Well appearing. No resp difficulty.  Sitting in the chair.  HEENT: normal Neck: supple. JVP flat   . Carotids 2+ bilat; no bruits. No lymphadenopathy or thryomegaly appreciated. Cor: PMI nondisplaced. Regular rate & rhythm. No rubs, gallops or murmurs. Lungs: clear Abdomen: soft, nontender, nondistended. No hepatosplenomegaly. No bruits or masses. Good bowel sounds. Extremities: no cyanosis, clubbing, rash, R and LLE no edema.  Neuro: alert & orientedx3, cranial nerves grossly intact. moves all 4 extremities w/o difficulty. Affect pleasant  Telemetry: NSR 70s   Labs: Basic Metabolic Panel:  Recent Labs Lab 10/06/15 0650 10/06/15 1727 10/07/15 0608 10/08/15 0430  NA 141  --  139 138  K 5.0  --  4.3 4.0  CL 104  --  104 102  CO2 25  --  23 25  GLUCOSE 173*  --  140* 169*  BUN 28*  --  27* 30*  CREATININE 1.08  --  1.42* 1.16  CALCIUM 9.3  --  8.8* 8.2*  MG  --  1.7  --  2.0  PHOS  --  4.7*  --   --     Liver Function Tests:  Recent Labs Lab 10/06/15 0650 10/07/15 0608 10/08/15 0430  AST 23 25 19   ALT 22 17 15*  ALKPHOS 53 48 40  BILITOT 1.2 1.4* 0.8  PROT  6.8 6.3* 5.4*  ALBUMIN 4.0 3.3* 2.8*    Recent Labs Lab 10/06/15 0650 10/07/15 0608  LIPASE 22 20   No results for input(s): AMMONIA in the last 168 hours.  CBC:  Recent Labs Lab 10/06/15 0650 10/07/15 0608 10/08/15 0430  WBC 8.4 13.5* 11.1*  NEUTROABS 6.8  --   --   HGB 15.0 15.5 13.9  HCT 46.4 48.0 43.0  MCV 76.1* 77.5* 76.1*  PLT 175 185 151    Cardiac Enzymes: No results for input(s): CKTOTAL, CKMB, CKMBINDEX, TROPONINI in the last 168 hours.  BNP: BNP (last 3 results)  Recent Labs  10/06/15 1939  BNP 1374.4*    ProBNP (last 3 results) No results for input(s): PROBNP in the last 8760 hours.    Other results:  Imaging: Dg Chest 2 View  10/06/2015  CLINICAL DATA:  Preop for cholelithiasis EXAM: CHEST  2 VIEW COMPARISON:   07/14/2015 FINDINGS: Cardiomegaly again noted. Central mild vascular congestion without convincing pulmonary edema. 3 leads cardiac pacemaker is unchanged in position. Mild basilar atelectasis. No segmental infiltrate. IMPRESSION: Cardiomegaly. Three leads cardiac pacemaker in place. Central mild vascular congestion without pulmonary edema. Mild basilar atelectasis. Electronically Signed   By: Natasha Mead M.D.   On: 10/06/2015 13:23   Dg Chest Port 1 View  10/07/2015  CLINICAL DATA:  PICC line placement. Acute on chronic systolic congestive heart failure. Coronary artery disease. Diabetes and hypertension. EXAM: PORTABLE CHEST 1 VIEW COMPARISON:  10/06/2015 FINDINGS: New right arm PICC line is seen with tip overlying expected region of superior cavoatrial junction. Low lung volumes are seen. Cardiomegaly is stable. Transvenous pacemaker remains in appropriate position. No evidence of pulmonary consolidation or edema. No evidence of pneumothorax or pleural effusion. IMPRESSION: Cardiomegaly and low lung volumes. New right arm PICC line tip seen in expected region of superior cavoatrial junction. Electronically Signed   By: Myles Rosenthal M.D.   On: 10/07/2015 16:12   US Abdomen Limited Ruq  10/06/2015  CLINICAL DATA:  Right upper quadrant pain for 1 day EXAM: US ABDOMEN LIMITED - RIGHT UPPER QUADRANT COMPARISON:  08/10/2014 FINDINGS: Gallbladder: Stable gallstone is again identified. Mild wall thickening is noted to 4 mm. A positive sonographic Eulah Pont sign is seen. Common bile duct: Diameter: 4 mm Liver: No focal lesion identified. Within normal limits in parenchymal echogenicity. Incidental note is again made of a right renal calculus measuring 9 mm. It is nonobstructive in nature. IMPRESSION: Cholelithiasis with positive sonographic Murphy's sign and mild wall thickening. Right renal stone. Electronically Signed   By: Alcide Clever M.D.   On: 10/06/2015 08:52     Medications:     Scheduled Medications: .  aspirin  81 mg Oral Daily  . carvedilol  12.5 mg Oral BID WC  . furosemide  80 mg Intravenous BID  . heparin subcutaneous  5,000 Units Subcutaneous 3 times per day  . insulin aspart  0-20 Units Subcutaneous TID WC  . insulin aspart  0-5 Units Subcutaneous QHS  . pregabalin  150 mg Oral QHS  . rosuvastatin  40 mg Oral q1800  . sodium chloride flush  10-40 mL Intracatheter Q12H  . sodium chloride flush  3 mL Intravenous Q12H  . sodium chloride flush  3 mL Intravenous Q12H    Infusions:    PRN Medications: sodium chloride, [DISCONTINUED] acetaminophen **OR** acetaminophen, acetaminophen, morphine injection, ondansetron (ZOFRAN) IV, ondansetron **OR** [DISCONTINUED] ondansetron (ZOFRAN) IV, oxyCODONE-acetaminophen, sodium chloride flush, sodium chloride flush   Assessment:  1. Acute cholecystitis 2.CAD- Multivessel CAD 3. A/C Systolic Heart Failure 4. HTN 5. Hyperlipidemia   Plan/Discussion:   Possible lap chole tomorrow. Todays CO-OX 51.5%.  Appears euvolemic. CVP 2-3. Stop IV lasix. Start 40 mg po lasix daily. Need to add spiro after surgery.  Continue carvedilol 12.5 mg twice a day. SBP low. Hold off benicar for now.  Renal function stable.   Cath today with multivessel CAD. Cath with severe 2V CAD in the LAD and RCA.Unable to perform MRI with ICD. Revascularize down the road.   Continue aspirin, bb, and crestor.   Consult cardiac rehab.   Length of Stay: 2   Amy Clegg NP-C  10/08/2015, 7:47 AM   Advanced Heart Failure Team Pager [phone] (M-F; 7a - 4p)  Please contact [facility] for night-coverage after hours (4p -7a ) and weekends on [email or web address]  Addendum: repeat CO-OX 57%. Ok to for surgery tomorrow.  Amy Clegg 2:22 PM   Patient seen and examined with Tonye Becket, NP. We discussed all aspects of the encounter. I agree with the assessment and plan as stated above.   Much improved with diuresis. No CP. Co-ox acceptable. HF probably about as tuned up as we can  get him. Ok to proceed with lap chole tomorrow. He is aware that he has moderate risk of peri-op complications.   Once he recovers from chole plan 2-V PCI in several weeks.   Case d/w Surgery and the interventional team. CVP and co-oxs checked personally.   Bensimhon, Daniel,MD 5:57 PM

## 2015-10-08 NOTE — Progress Notes (Signed)
[facility] PROGRESS NOTE  Benjamin THOMLEY [name]:[id number] DOB: 14-Oct-1960 DOA: 10/06/2015 PCP: Jeanann Lewandowsky, MD  Length of stay: 2   Assessment/Plan: Active Problems:   Essential hypertension   DM (diabetes mellitus) type 2, uncontrolled, with ketoacidosis (HCC)   Cholecystitis   Neuropathy (HCC)   Acute on chronic systolic heart failure (HCC)   Preoperative clearance   Preop cardiovascular exam   Coronary artery disease involving native coronary artery of native heart without angina pectoris   Brief summary 55 y.o. male with type II diabetes ,HTN, chronic systolic heart failure with ICD. Followed by Dr. Lewayne Bunting. Patient seen today at [facility] today with nausea, vomiting and upper abdominal pain. The pain started around 6 AM this morning, it was located in the right upper quadrant. Radiation through to the back . Patient had similar pain about 6 months ago, told it was his gallbladder but nothing needed to be done unless "it became infected ". Right upper quadrant pain was severe, constant, unrelated to meals .  Marland Kitchen In ED, ultrasound reveals cholelithiasis and mild gallbladder wall thickening.  Patient also with a possible cholecystectomy. Cardiology consulted for clearance.  Assessment and plan Cholelithiasis/cholecystitis. Patient being followed by general surgery  plan is for laparoscopic cholecystectomy after clearance by cardiology, status post cardiac cath, needs optimization of his heart failure prior to proceeding with surgery,possibly Thursday or Friday Continue clear liquid diet  Continue Zosyn for acute cholecystitis  Chronic systolic heart failure / left bundle branch block, s/p BiV ICD.  August 2016 ejection fraction was 20% with diffuse hypokinesis. Mild LVH. Mild MR. Left atrium severely dilated, Cath with severe 2V CAD in the LAD and RCA. Currently not having any angina No pulmonary edema on CXR. No edema. Chronic shortness of breath  (unchanged). Heart failure team consulted and following, medical management per cardiology  Plan is tooptimize his HF and let him proceed with lap chole later this week acknowledging a moderate to high peri-operative risk of CV complications Continue Coreg, IV Lasix, Aldactone,  Acute kidney injury Creatinine has increased from 1.08> 1.4> 1.16 Continue Lasix for diuresis,Hold benicar   Type 2 diabetes. Hgb a1c in Epic late August was 8.9. Hemoglobin A1c 7.3 -Hold home metformin and home basal insulin -Monitor CBGs, sliding scale insulin - resistant  -resume Lantus  today  Hypertension. Elevated BP today.  Continue Coreg,    Hyperlipidemia. -Hold home statin for now  Neuropathy. -continue home Lyrica    DVT prophylaxsis   Heparin subcutaneous  Code Status:      Code Status Orders        Start     Ordered   10/06/15 1710  Full code   Continuous     10/06/15 1711     Family Communication: Discussed in detail with the patient, all imaging results, lab results explained to the patient   Disposition Plan: Pending further recommendations from cardiology      Consultants:  Cardiology  General surgery    Procedures:  None  Antibiotics: Anti-infectives    Start     Dose/Rate Route Frequency Ordered Stop   10/06/15 0930  cefTRIAXone (ROCEPHIN) 2 g in dextrose 5 % 50 mL IVPB     2 g 100 mL/hr over 30 Minutes Intravenous  Once 10/06/15 0929 10/06/15 1015         HPI/Subjective: Had some right upper quadrant pain last night after eating a regular meal., denies any chest pain  or shortness of breath  Objective: Filed Vitals:   10/08/15 0100 10/08/15 0209 10/08/15 0300 10/08/15 0400  BP: 95/78  91/67 120/68  Pulse: 60  58 69  Temp:   98.9 F (37.2 C)   TempSrc:   Oral   Resp: 19  18 21   Height:      Weight:  112.6 kg (248 lb 3.8 oz)    SpO2: 98%  99% 87%    Intake/Output Summary (Last 24 hours) at 10/08/15 0805 Last data filed at 10/08/15  0641  Gross per 24 hour  Intake 923.45 ml  Output   2425 ml  Net -1501.55 ml    Exam:  General: Well appearing. No resp difficulty. In bed  HEENT: normal Neck: supple. JVP 8-9 . Carotids 2+ bilat; no bruits. No lymphadenopathy or thryomegaly appreciated. Cor: PMI nondisplaced. Regular rate & rhythm. No rubs, gallops or murmurs. Lungs: clear Abdomen: soft, nontender, nondistended. No hepatosplenomegaly. No bruits or masses. Good bowel sounds. Extremities: no cyanosis, clubbing, rash, R and LLE trace-1+ edema Neuro: alert & orientedx3, cranial nerves grossly intact. moves all 4 extremities w/o difficulty. Affect pleasant  Data Review   Micro Results Recent Results (from the past 240 hour(s))  MRSA PCR Screening     Status: None   Collection Time: 10/07/15 10:10 PM  Result Value Ref Range Status   MRSA by PCR NEGATIVE NEGATIVE Final    Comment:        The GeneXpert MRSA Assay (FDA approved for NASAL specimens only), is one component of a comprehensive MRSA colonization surveillance program. It is not intended to diagnose MRSA infection nor to guide or monitor treatment for MRSA infections.     Radiology Reports Dg Chest 2 View  10/06/2015  CLINICAL DATA:  Preop for cholelithiasis EXAM: CHEST  2 VIEW COMPARISON:  07/14/2015 FINDINGS: Cardiomegaly again noted. Central mild vascular congestion without convincing pulmonary edema. 3 leads cardiac pacemaker is unchanged in position. Mild basilar atelectasis. No segmental infiltrate. IMPRESSION: Cardiomegaly. Three leads cardiac pacemaker in place. Central mild vascular congestion without pulmonary edema. Mild basilar atelectasis. Electronically Signed   By: Natasha Mead M.D.   On: 10/06/2015 13:23   Dg Chest Port 1 View  10/07/2015  CLINICAL DATA:  PICC line placement. Acute on chronic systolic congestive heart failure. Coronary artery disease. Diabetes and hypertension. EXAM: PORTABLE CHEST 1 VIEW COMPARISON:  10/06/2015 FINDINGS:  New right arm PICC line is seen with tip overlying expected region of superior cavoatrial junction. Low lung volumes are seen. Cardiomegaly is stable. Transvenous pacemaker remains in appropriate position. No evidence of pulmonary consolidation or edema. No evidence of pneumothorax or pleural effusion. IMPRESSION: Cardiomegaly and low lung volumes. New right arm PICC line tip seen in expected region of superior cavoatrial junction. Electronically Signed   By: Myles Rosenthal M.D.   On: 10/07/2015 16:12   US Abdomen Limited Ruq  10/06/2015  CLINICAL DATA:  Right upper quadrant pain for 1 day EXAM: US ABDOMEN LIMITED - RIGHT UPPER QUADRANT COMPARISON:  08/10/2014 FINDINGS: Gallbladder: Stable gallstone is again identified. Mild wall thickening is noted to 4 mm. A positive sonographic Eulah Pont sign is seen. Common bile duct: Diameter: 4 mm Liver: No focal lesion identified. Within normal limits in parenchymal echogenicity. Incidental note is again made of a right renal calculus measuring 9 mm. It is nonobstructive in nature. IMPRESSION: Cholelithiasis with positive sonographic Murphy's sign and mild wall thickening. Right renal stone. Electronically Signed   By: Eulah Pont.D.  On: 10/06/2015 08:52     CBC  Recent Labs Lab 10/06/15 0650 10/07/15 0608 10/08/15 0430  WBC 8.4 13.5* 11.1*  HGB 15.0 15.5 13.9  HCT 46.4 48.0 43.0  PLT 175 185 151  MCV 76.1* 77.5* 76.1*  MCH 24.6* 25.0* 24.6*  MCHC 32.3 32.3 32.3  RDW 20.1* 18.8* 18.4*  LYMPHSABS 0.8  --   --   MONOABS 0.5  --   --   EOSABS 0.3  --   --   BASOSABS 0.0  --   --     Chemistries   Recent Labs Lab 10/06/15 0650 10/06/15 1727 10/07/15 0608 10/08/15 0430  NA 141  --  139 138  K 5.0  --  4.3 4.0  CL 104  --  104 102  CO2 25  --  23 25  GLUCOSE 173*  --  140* 169*  BUN 28*  --  27* 30*  CREATININE 1.08  --  1.42* 1.16  CALCIUM 9.3  --  8.8* 8.2*  MG  --  1.7  --  2.0  AST 23  --  25 19  ALT 22  --  17 15*  ALKPHOS 53  --  48  40  BILITOT 1.2  --  1.4* 0.8   ------------------------------------------------------------------------------------------------------------------ estimated creatinine clearance is 88.2 mL/min (by C-G formula based on Cr of 1.16). ------------------------------------------------------------------------------------------------------------------  Recent Labs  10/06/15 1727  HGBA1C 7.3*   ------------------------------------------------------------------------------------------------------------------ No results for input(s): CHOL, HDL, LDLCALC, TRIG, CHOLHDL, LDLDIRECT in the last 72 hours. ------------------------------------------------------------------------------------------------------------------ No results for input(s): TSH, T4TOTAL, T3FREE, THYROIDAB in the last 72 hours.  Invalid input(s): FREET3 ------------------------------------------------------------------------------------------------------------------ No results for input(s): VITAMINB12, FOLATE, FERRITIN, TIBC, IRON, RETICCTPCT in the last 72 hours.  Coagulation profile  Recent Labs Lab 10/07/15 0608  INR 1.43    No results for input(s): DDIMER in the last 72 hours.  Cardiac Enzymes No results for input(s): CKMB, TROPONINI, MYOGLOBIN in the last 168 hours.  Invalid input(s): CK ------------------------------------------------------------------------------------------------------------------ Invalid input(s): POCBNP   CBG:  Recent Labs Lab 10/06/15 2143 10/07/15 0842 10/07/15 1129 10/07/15 1654 10/07/15 2158  GLUCAP 143* 137* 248* 215* 258*       Studies: Dg Chest 2 View  10/06/2015  CLINICAL DATA:  Preop for cholelithiasis EXAM: CHEST  2 VIEW COMPARISON:  07/14/2015 FINDINGS: Cardiomegaly again noted. Central mild vascular congestion without convincing pulmonary edema. 3 leads cardiac pacemaker is unchanged in position. Mild basilar atelectasis. No segmental infiltrate. IMPRESSION: Cardiomegaly.  Three leads cardiac pacemaker in place. Central mild vascular congestion without pulmonary edema. Mild basilar atelectasis. Electronically Signed   By: Natasha Mead M.D.   On: 10/06/2015 13:23   Dg Chest Port 1 View  10/07/2015  CLINICAL DATA:  PICC line placement. Acute on chronic systolic congestive heart failure. Coronary artery disease. Diabetes and hypertension. EXAM: PORTABLE CHEST 1 VIEW COMPARISON:  10/06/2015 FINDINGS: New right arm PICC line is seen with tip overlying expected region of superior cavoatrial junction. Low lung volumes are seen. Cardiomegaly is stable. Transvenous pacemaker remains in appropriate position. No evidence of pulmonary consolidation or edema. No evidence of pneumothorax or pleural effusion. IMPRESSION: Cardiomegaly and low lung volumes. New right arm PICC line tip seen in expected region of superior cavoatrial junction. Electronically Signed   By: Myles Rosenthal M.D.   On: 10/07/2015 16:12   US Abdomen Limited Ruq  10/06/2015  CLINICAL DATA:  Right upper quadrant pain for 1 day EXAM: US ABDOMEN LIMITED - RIGHT UPPER QUADRANT COMPARISON:  08/10/2014 FINDINGS: Gallbladder: Stable gallstone is again identified. Mild wall thickening is noted to 4 mm. A positive sonographic Eulah Pont sign is seen. Common bile duct: Diameter: 4 mm Liver: No focal lesion identified. Within normal limits in parenchymal echogenicity. Incidental note is again made of a right renal calculus measuring 9 mm. It is nonobstructive in nature. IMPRESSION: Cholelithiasis with positive sonographic Murphy's sign and mild wall thickening. Right renal stone. Electronically Signed   By: Alcide Clever M.D.   On: 10/06/2015 08:52      Lab Results  Component Value Date   HGBA1C 7.3* 10/06/2015   HGBA1C 8.0 03/27/2015   HGBA1C 7.4 07/22/2014   Lab Results  Component Value Date   LDLCALC 120 03/27/2015   CREATININE 1.16 10/08/2015       Scheduled Meds: . aspirin  81 mg Oral Daily  . carvedilol  12.5 mg Oral  BID WC  . furosemide  80 mg Intravenous BID  . heparin subcutaneous  5,000 Units Subcutaneous 3 times per day  . insulin aspart  0-20 Units Subcutaneous TID WC  . insulin aspart  0-5 Units Subcutaneous QHS  . pregabalin  150 mg Oral QHS  . rosuvastatin  40 mg Oral q1800  . sodium chloride flush  10-40 mL Intracatheter Q12H  . sodium chloride flush  3 mL Intravenous Q12H  . sodium chloride flush  3 mL Intravenous Q12H  . spironolactone  12.5 mg Oral Daily   Continuous Infusions:    Active Problems:   Essential hypertension   DM (diabetes mellitus) type 2, uncontrolled, with ketoacidosis (HCC)   Cholecystitis   Neuropathy (HCC)   Acute on chronic systolic heart failure (HCC)   Preoperative clearance   Preop cardiovascular exam   Coronary artery disease involving native coronary artery of native heart without angina pectoris    Time spent: 45 minutes   [facility]  [facility] Pager [phone]. If 7PM-7AM, please contact night-coverage at [email or web address], password [facility] 10/08/2015, 8:05 AM  LOS: 2 days

## 2015-10-08 NOTE — Progress Notes (Signed)
1 Day Post-Op  Subjective: Stable and alert Had some right upper quadrant pain last night after eating a regular meal. I have changed his diet to clear liquids  Cath results noted and patient's care plan discussed with Dr. Jones Broom.  Patient has significant coronary artery disease and cardiomyopathy and AICD in place.  I suspect he will continue to have right upper quadrant pain when he eats an ideal surgical management would be laparoscopic cholecystectomy with cholangiogram.  We agreed that once he is tuned up from a cardiology standpoint, we will proceed with laparoscopic cholecystectomy this admission, possibly Thursday or Friday.  In the future he will probably need coronary artery interventions and stents.  Hemoglobin 13.9.  WBC 11,100.  Liver function tests normal.  Glucose 169.  Creatinine 1.16.  Potassium 4.0.  Objective: Vital signs in last 24 hours: Temp:  [97 F (36.1 C)-99.2 F (37.3 C)] 98.9 F (37.2 C) (03/08 0300) Pulse Rate:  [0-100] 69 (03/08 0400) Resp:  [0-45] 21 (03/08 0400) BP: (91-148)/(56-84) 120/68 mmHg (03/08 0400) SpO2:  [0 %-99 %] 87 % (03/08 0400) Weight:  [112.6 kg (248 lb 3.8 oz)-112.628 kg (248 lb 4.8 oz)] 112.6 kg (248 lb 3.8 oz) (03/08 0209) Last BM Date: 10/06/15  Intake/Output from previous day: 03/07 0701 - 03/08 0700 In: 923.5 [P.O.:480; I.V.:393.5; IV Piggyback:50] Out: 2425 [Urine:2425] Intake/Output this shift:    General appearance: Alert.  No distress. Resp: clear to auscultation bilaterally GI: Soft.  Nondistended.  Objectively nontender but does report pain with palpation of right upper quadrant.  No mass palpated.  Lab Results:   Recent Labs  10/07/15 0608 10/08/15 0430  WBC 13.5* 11.1*  HGB 15.5 13.9  HCT 48.0 43.0  PLT 185 151   BMET  Recent Labs  10/07/15 0608 10/08/15 0430  NA 139 138  K 4.3 4.0  CL 104 102  CO2 23 25  GLUCOSE 140* 169*  BUN 27* 30*  CREATININE 1.42* 1.16  CALCIUM 8.8* 8.2*    PT/INR  Recent Labs  10/07/15 0608  LABPROT 17.5*  INR 1.43   ABG No results for input(s): PHART, HCO3 in the last 72 hours.  Invalid input(s): PCO2, PO2  Studies/Results: Dg Chest 2 View  10/06/2015  CLINICAL DATA:  Preop for cholelithiasis EXAM: CHEST  2 VIEW COMPARISON:  07/14/2015 FINDINGS: Cardiomegaly again noted. Central mild vascular congestion without convincing pulmonary edema. 3 leads cardiac pacemaker is unchanged in position. Mild basilar atelectasis. No segmental infiltrate. IMPRESSION: Cardiomegaly. Three leads cardiac pacemaker in place. Central mild vascular congestion without pulmonary edema. Mild basilar atelectasis. Electronically Signed   By: Natasha Mead M.D.   On: 10/06/2015 13:23   Dg Chest Port 1 View  10/07/2015  CLINICAL DATA:  PICC line placement. Acute on chronic systolic congestive heart failure. Coronary artery disease. Diabetes and hypertension. EXAM: PORTABLE CHEST 1 VIEW COMPARISON:  10/06/2015 FINDINGS: New right arm PICC line is seen with tip overlying expected region of superior cavoatrial junction. Low lung volumes are seen. Cardiomegaly is stable. Transvenous pacemaker remains in appropriate position. No evidence of pulmonary consolidation or edema. No evidence of pneumothorax or pleural effusion. IMPRESSION: Cardiomegaly and low lung volumes. New right arm PICC line tip seen in expected region of superior cavoatrial junction. Electronically Signed   By: Myles Rosenthal M.D.   On: 10/07/2015 16:12   US Abdomen Limited Ruq  10/06/2015  CLINICAL DATA:  Right upper quadrant pain for 1 day EXAM: US ABDOMEN LIMITED - RIGHT UPPER QUADRANT  COMPARISON:  08/10/2014 FINDINGS: Gallbladder: Stable gallstone is again identified. Mild wall thickening is noted to 4 mm. A positive sonographic Eulah Pont sign is seen. Common bile duct: Diameter: 4 mm Liver: No focal lesion identified. Within normal limits in parenchymal echogenicity. Incidental note is again made of a right renal  calculus measuring 9 mm. It is nonobstructive in nature. IMPRESSION: Cholelithiasis with positive sonographic Murphy's sign and mild wall thickening. Right renal stone. Electronically Signed   By: Alcide Clever M.D.   On: 10/06/2015 08:52    Anti-infectives: Anti-infectives    Start     Dose/Rate Route Frequency Ordered Stop   10/06/15 0930  cefTRIAXone (ROCEPHIN) 2 g in dextrose 5 % 50 mL IVPB     2 g 100 mL/hr over 30 Minutes Intravenous  Once 10/06/15 0929 10/06/15 1015      Assessment/Plan: s/p Procedure(s): Left Heart Cath and Coronary Angiography  Cholelithiasis, biliary colic, possible mild acute cholecystitis. Continue antibiotics.  Clear liquid diet Tentatively planning for laparoscopic cholecystectomy with cholangiogram either Thursday or Friday I discussed the indications, details, techniques, and numerous risk of the surgery with the patient.  He is aware the risk of bleeding, infection, conversion to open laparotomy, wound healing problems and hernia, injury to adjacent organs with major reconstructive surgery, and obviously cardiac pulmonary and thromboembolic problems.  He understands all these issues well.  All his questions are answered.  He agrees with this plan.  Await cardiac assessment to decide timing of surgery  Cardiomyopathy, congestive heart failure, hypertension, biventricular ICD, CAD LHC 10/07/2015-  1. Prox RCA to Mid RCA lesion, 20% stenosed. 2. Dist RCA lesion, 90% stenosed. 3. RPDA lesion, 90% stenosed. 4. 1st Diag lesion, 100% stenosed. 5. Mid LAD lesion, 95% stenosed. 6. Dist LAD lesion, 90% stenosed. 7. Ost Cx lesion, 60% stenosed. 8. 2nd Mrg lesion, 95% stenosed. 9. Prox Cx to Mid Cx lesion, 60% stenosed.  Diabetes mellitus without complication.   LOS: 2 days    Sherrita Riederer M 10/08/2015

## 2015-10-08 NOTE — Progress Notes (Signed)
Pharmacy Antibiotic Note  Benjamin [name] is a 55 y.o. male admitted on 10/06/2015 with intra-abdominal infection.  Pharmacy has been consulted for Zosyn dosing.  Plan: Zosyn 3.375g IV q8h (4 hour infusion).  Daily CBC Follow up plans for surgery  Height: 5\' 7"  (170.2 cm) Weight: 248 lb 3.8 oz (112.6 kg) IBW/kg (Calculated) : 66.1  Temp (24hrs), Avg:98.3 F (36.8 C), Min:97 F (36.1 C), Max:99.2 F (37.3 C)   Recent Labs Lab 10/06/15 0650 10/07/15 0608 10/08/15 0430  WBC 8.4 13.5* 11.1*  CREATININE 1.08 1.42* 1.16    Estimated Creatinine Clearance: 88.2 mL/min (by C-G formula based on Cr of 1.16).    Allergies  Allergen Reactions  . Pork-Derived Products     Does not eat pork or pork derived products d/t religious reasons    Antimicrobials this admission: 3/7 Zosyn >>   Dose adjustments this admission: none  Thank you for allowing pharmacy to be a part of this patient's care.   Sherron Monday, PharmD Clinical Pharmacy Resident Pager: [phone] 10/08/2015 8:30 AM

## 2015-10-09 ENCOUNTER — Encounter (HOSPITAL_COMMUNITY)
Admission: EM | Disposition: A | Discharge status: Home / Self Care | Payer: Self-pay | Source: Home / Self Care | Attending: Internal Medicine

## 2015-10-09 ENCOUNTER — Inpatient Hospital Stay (HOSPITAL_COMMUNITY): Payer: Self-pay | Admitting: Anesthesiology

## 2015-10-09 DIAGNOSIS — E1310 Other specified diabetes mellitus with ketoacidosis without coma: Secondary | ICD-10-CM | POA: Diagnosis not present

## 2015-10-09 DIAGNOSIS — I2510 Atherosclerotic heart disease of native coronary artery without angina pectoris: Secondary | ICD-10-CM | POA: Diagnosis not present

## 2015-10-09 DIAGNOSIS — I5023 Acute on chronic systolic (congestive) heart failure: Secondary | ICD-10-CM | POA: Diagnosis not present

## 2015-10-09 DIAGNOSIS — K819 Cholecystitis, unspecified: Secondary | ICD-10-CM | POA: Diagnosis not present

## 2015-10-09 HISTORY — PX: CHOLECYSTECTOMY: SHX55

## 2015-10-09 LAB — GLUCOSE, CAPILLARY
GLUCOSE-CAPILLARY: 154 mg/dL — AB (ref 65–99)
GLUCOSE-CAPILLARY: 96 mg/dL (ref 65–99)
GLUCOSE-CAPILLARY: 124 mg/dL — AB (ref 65–99)
Glucose-Capillary: 93 mg/dL (ref 65–99)
Glucose-Capillary: 93 mg/dL (ref 65–99)
Glucose-Capillary: 115 mg/dL — ABNORMAL HIGH (ref 65–99)

## 2015-10-09 LAB — CBC
HCT: 42.7 % (ref 39.0–52.0)
HEMATOCRIT: 42.9 % (ref 39.0–52.0)
HEMOGLOBIN: 13.6 g/dL (ref 13.0–17.0)
Hemoglobin: 14.3 g/dL (ref 13.0–17.0)
MCH: 25.6 pg — ABNORMAL LOW (ref 26.0–34.0)
MCH: 24.5 pg — ABNORMAL LOW (ref 26.0–34.0)
MCHC: 33.5 g/dL (ref 30.0–36.0)
MCHC: 31.7 g/dL (ref 30.0–36.0)
MCV: 76.5 fL — AB (ref 78.0–100.0)
MCV: 77.4 fL — AB (ref 78.0–100.0)
PLATELETS: 156 10*3/uL (ref 150–400)
Platelets: 153 10*3/uL (ref 150–400)
RBC: 5.58 MIL/uL (ref 4.22–5.81)
RBC: 5.54 MIL/uL (ref 4.22–5.81)
RDW: 18 % — AB (ref 11.5–15.5)
RDW: 18 % — AB (ref 11.5–15.5)
WBC: 8.4 10*3/uL (ref 4.0–10.5)
WBC: 6.3 10*3/uL (ref 4.0–10.5)

## 2015-10-09 LAB — COMPREHENSIVE METABOLIC PANEL
ALBUMIN: 2.8 g/dL — AB (ref 3.5–5.0)
ALT: 22 U/L (ref 17–63)
AST: 24 U/L (ref 15–41)
Alkaline Phosphatase: 47 U/L (ref 38–126)
Anion gap: 15 (ref 5–15)
BUN: 21 mg/dL — AB (ref 6–20)
CHLORIDE: 102 mmol/L (ref 101–111)
CO2: 24 mmol/L (ref 22–32)
Calcium: 8.3 mg/dL — ABNORMAL LOW (ref 8.9–10.3)
Creatinine, Ser: 1.01 mg/dL (ref 0.61–1.24)
GFR calc Af Amer: >60 mL/min (ref >60)
Glucose, Bld: 102 mg/dL — ABNORMAL HIGH (ref 65–99)
POTASSIUM: 4 mmol/L (ref 3.5–5.1)
SODIUM: 141 mmol/L (ref 135–145)
Total Bilirubin: 1 mg/dL (ref 0.3–1.2)
Total Protein: 5.9 g/dL — ABNORMAL LOW (ref 6.5–8.1)

## 2015-10-09 LAB — CREATININE, SERUM: Creatinine, Ser: 1.04 mg/dL (ref 0.61–1.24)

## 2015-10-09 LAB — CARBOXYHEMOGLOBIN
CARBOXYHEMOGLOBIN: 1.7 % — AB (ref 0.5–1.5)
METHEMOGLOBIN: 0.8 % (ref 0.0–1.5)
O2 SAT: 51.5 %
TOTAL HEMOGLOBIN: 14 g/dL (ref 13.5–18.0)

## 2015-10-09 LAB — SURGICAL PCR SCREEN
MRSA, PCR: NEGATIVE
STAPHYLOCOCCUS AUREUS: NEGATIVE

## 2015-10-09 SURGERY — LAPAROSCOPIC CHOLECYSTECTOMY
Anesthesia: General | Site: Abdomen

## 2015-10-09 MED ORDER — ASPIRIN EC 81 MG PO TBEC
81.0000 mg | DELAYED_RELEASE_TABLET | Freq: Every day | ORAL | Status: DC
  Administered 2015-10-10 – 2015-10-11 (×2): 81 mg via ORAL
  Filled 2015-10-09 (×2): qty 1

## 2015-10-09 MED ORDER — SUGAMMADEX SODIUM 200 MG/2ML IV SOLN
INTRAVENOUS | Status: DC | PRN
  Administered 2015-10-09: 200 mg via INTRAVENOUS

## 2015-10-09 MED ORDER — ONDANSETRON HCL 4 MG/2ML IJ SOLN: 4.0000 mg | Freq: Four times a day (QID) | INTRAMUSCULAR | Status: DC | PRN

## 2015-10-09 MED ORDER — HEMOSTATIC AGENTS (NO CHARGE) OPTIME
TOPICAL | Status: DC | PRN
  Administered 2015-10-09: 1 via TOPICAL

## 2015-10-09 MED ORDER — LIDOCAINE HCL (CARDIAC) 20 MG/ML IV SOLN
INTRAVENOUS | Status: DC | PRN
  Administered 2015-10-09: 40 mg via INTRAVENOUS

## 2015-10-09 MED ORDER — PANTOPRAZOLE SODIUM 40 MG IV SOLR
40.0000 mg | Freq: Every day | INTRAVENOUS | Status: DC
  Administered 2015-10-09: 40 mg via INTRAVENOUS
  Filled 2015-10-09: qty 40

## 2015-10-09 MED ORDER — ROSUVASTATIN CALCIUM 20 MG PO TABS
20.0000 mg | ORAL_TABLET | Freq: Every day | ORAL | Status: DC
  Administered 2015-10-09 – 2015-10-11 (×3): 20 mg via ORAL
  Filled 2015-10-09 (×2): qty 1

## 2015-10-09 MED ORDER — 0.9 % SODIUM CHLORIDE (POUR BTL) OPTIME
TOPICAL | Status: DC | PRN
  Administered 2015-10-09: 1000 mL

## 2015-10-09 MED ORDER — ROCURONIUM BROMIDE 100 MG/10ML IV SOLN
INTRAVENOUS | Status: DC | PRN
  Administered 2015-10-09: 50 mg via INTRAVENOUS

## 2015-10-09 MED ORDER — LACTATED RINGERS IV SOLN
INTRAVENOUS | Status: DC
  Administered 2015-10-09: 19:00:00 via INTRAVENOUS

## 2015-10-09 MED ORDER — SUGAMMADEX SODIUM 500 MG/5ML IV SOLN
INTRAVENOUS | Status: AC
  Filled 2015-10-09: qty 5

## 2015-10-09 MED ORDER — SODIUM CHLORIDE 0.9 % IV SOLN: INTRAVENOUS | Status: DC | PRN

## 2015-10-09 MED ORDER — FONDAPARINUX SODIUM 2.5 MG/0.5ML ~~LOC~~ SOLN
2.5000 mg | Freq: Every day | SUBCUTANEOUS | Status: DC
  Administered 2015-10-10 – 2015-10-11 (×2): 2.5 mg via SUBCUTANEOUS
  Filled 2015-10-09 (×2): qty 0.5

## 2015-10-09 MED ORDER — LACTATED RINGERS IV SOLN
INTRAVENOUS | Status: DC | PRN
  Administered 2015-10-09: 16:00:00 via INTRAVENOUS

## 2015-10-09 MED ORDER — BUPIVACAINE-EPINEPHRINE 0.5% -1:200000 IJ SOLN
INTRAMUSCULAR | Status: DC | PRN
  Administered 2015-10-09: 9.5 mL

## 2015-10-09 MED ORDER — BENZONATATE 100 MG PO CAPS: 100.0000 mg | ORAL_CAPSULE | Freq: Three times a day (TID) | ORAL | Status: DC

## 2015-10-09 MED ORDER — MIDAZOLAM HCL 5 MG/5ML IJ SOLN
INTRAMUSCULAR | Status: DC | PRN
  Administered 2015-10-09: 2 mg via INTRAVENOUS

## 2015-10-09 MED ORDER — HYDROMORPHONE HCL 1 MG/ML IJ SOLN: 1.0000 mg | INTRAMUSCULAR | Status: DC | PRN

## 2015-10-09 MED ORDER — HEPARIN SODIUM (PORCINE) 5000 UNIT/ML IJ SOLN: 5000.0000 [IU] | Freq: Three times a day (TID) | INTRAMUSCULAR | Status: DC

## 2015-10-09 MED ORDER — HYDROMORPHONE HCL 1 MG/ML IJ SOLN
0.2500 mg | INTRAMUSCULAR | Status: DC | PRN
  Administered 2015-10-09 (×2): 0.5 mg via INTRAVENOUS

## 2015-10-09 MED ORDER — FENTANYL CITRATE (PF) 250 MCG/5ML IJ SOLN
INTRAMUSCULAR | Status: AC
  Filled 2015-10-09: qty 5

## 2015-10-09 MED ORDER — ETOMIDATE 2 MG/ML IV SOLN
INTRAVENOUS | Status: DC | PRN
  Administered 2015-10-09: 20 mg via INTRAVENOUS

## 2015-10-09 MED ORDER — METFORMIN HCL 500 MG PO TABS: 1000.0000 mg | ORAL_TABLET | Freq: Two times a day (BID) | ORAL | Status: DC

## 2015-10-09 MED ORDER — EPHEDRINE SULFATE 50 MG/ML IJ SOLN
INTRAMUSCULAR | Status: DC | PRN
  Administered 2015-10-09: 10 mg via INTRAVENOUS

## 2015-10-09 MED ORDER — PROPOFOL 10 MG/ML IV BOLUS
INTRAVENOUS | Status: AC
  Filled 2015-10-09: qty 20

## 2015-10-09 MED ORDER — PROPOFOL 10 MG/ML IV BOLUS
INTRAVENOUS | Status: DC | PRN
  Administered 2015-10-09: 50 mg via INTRAVENOUS

## 2015-10-09 MED ORDER — PIPERACILLIN-TAZOBACTAM 3.375 G IVPB
3.3750 g | Freq: Three times a day (TID) | INTRAVENOUS | Status: DC
  Administered 2015-10-10 – 2015-10-11 (×5): 3.375 g via INTRAVENOUS
  Filled 2015-10-09 (×6): qty 50

## 2015-10-09 MED ORDER — CHLORHEXIDINE GLUCONATE 4 % EX LIQD
1.0000 "application " | Freq: Once | CUTANEOUS | Status: DC
  Filled 2015-10-09: qty 60

## 2015-10-09 MED ORDER — INSULIN GLARGINE 100 UNIT/ML ~~LOC~~ SOLN
10.0000 [IU] | Freq: Every day | SUBCUTANEOUS | Status: DC
  Filled 2015-10-09: qty 0.1

## 2015-10-09 MED ORDER — HYDROMORPHONE HCL 1 MG/ML IJ SOLN
INTRAMUSCULAR | Status: AC
  Filled 2015-10-09: qty 1

## 2015-10-09 MED ORDER — SODIUM CHLORIDE 0.9 % IR SOLN
Status: DC | PRN
  Administered 2015-10-09: 1000 mL

## 2015-10-09 MED ORDER — INSULIN GLARGINE 100 UNIT/ML ~~LOC~~ SOLN
30.0000 [IU] | Freq: Every day | SUBCUTANEOUS | Status: DC
  Filled 2015-10-09 (×3): qty 0.3

## 2015-10-09 MED ORDER — PHENYLEPHRINE HCL 10 MG/ML IJ SOLN
10.0000 mg | INTRAVENOUS | Status: DC | PRN
  Administered 2015-10-09: 40 ug/min via INTRAVENOUS

## 2015-10-09 MED ORDER — LACTATED RINGERS IV SOLN: INTRAVENOUS | Status: DC

## 2015-10-09 MED ORDER — MIDAZOLAM HCL 2 MG/2ML IJ SOLN
INTRAMUSCULAR | Status: AC
  Filled 2015-10-09: qty 2

## 2015-10-09 MED ORDER — ONDANSETRON 4 MG PO TBDP: 4.0000 mg | ORAL_TABLET | Freq: Four times a day (QID) | ORAL | Status: DC | PRN

## 2015-10-09 MED ORDER — HYDROCODONE-ACETAMINOPHEN 5-325 MG PO TABS: 1.0000 | ORAL_TABLET | ORAL | Status: DC | PRN

## 2015-10-09 MED ORDER — INSULIN ASPART 100 UNIT/ML ~~LOC~~ SOLN
10.0000 [IU] | Freq: Two times a day (BID) | SUBCUTANEOUS | Status: DC
  Administered 2015-10-10 – 2015-10-11 (×2): 10 [IU] via SUBCUTANEOUS

## 2015-10-09 MED ORDER — LACTATED RINGERS IV SOLN
INTRAVENOUS | Status: DC
  Administered 2015-10-09: 15:00:00 via INTRAVENOUS

## 2015-10-09 MED ORDER — FENTANYL CITRATE (PF) 100 MCG/2ML IJ SOLN
INTRAMUSCULAR | Status: DC | PRN
  Administered 2015-10-09: 100 ug via INTRAVENOUS

## 2015-10-09 MED ORDER — BUPIVACAINE-EPINEPHRINE (PF) 0.5% -1:200000 IJ SOLN
INTRAMUSCULAR | Status: AC
  Filled 2015-10-09: qty 30

## 2015-10-09 MED FILL — Verapamil HCl IV Soln 2.5 MG/ML: INTRAVENOUS | Qty: 2 | Status: AC

## 2015-10-09 SURGICAL SUPPLY — 49 items
ADH SKN CLS APL DERMABOND .7 (GAUZE/BANDAGES/DRESSINGS) ×2
APPLIER CLIP ROT 10 11.4 M/L (STAPLE) ×4
APR CLP MED LRG 11.4X10 (STAPLE) ×2
BAG SPEC RTRVL LRG 6X4 10 (ENDOMECHANICALS) ×2
BLADE SURG ROTATE 9660 (MISCELLANEOUS) ×3 IMPLANT
CANISTER SUCTION 2500CC (MISCELLANEOUS) ×4 IMPLANT
CHLORAPREP W/TINT 26ML (MISCELLANEOUS) ×4 IMPLANT
CLIP APPLIE ROT 10 11.4 M/L (STAPLE) ×2 IMPLANT
COVER MAYO STAND STRL (DRAPES) ×4 IMPLANT
COVER SURGICAL LIGHT HANDLE (MISCELLANEOUS) ×4 IMPLANT
DECANTER SPIKE VIAL GLASS SM (MISCELLANEOUS) ×3 IMPLANT
DERMABOND ADVANCED (GAUZE/BANDAGES/DRESSINGS) ×2
DERMABOND ADVANCED .7 DNX12 (GAUZE/BANDAGES/DRESSINGS) ×2 IMPLANT
DRAPE C-ARM 42X72 X-RAY (DRAPES) ×4 IMPLANT
ELECT REM PT RETURN 9FT ADLT (ELECTROSURGICAL) ×4
ELECTRODE REM PT RTRN 9FT ADLT (ELECTROSURGICAL) ×2 IMPLANT
GAUZE SPONGE 4X4 12PLY STRL (GAUZE/BANDAGES/DRESSINGS) ×3 IMPLANT
GLOVE BIOGEL PI IND STRL 7.0 (GLOVE) ×1 IMPLANT
GLOVE BIOGEL PI IND STRL 7.5 (GLOVE) ×1 IMPLANT
GLOVE BIOGEL PI IND STRL 8 (GLOVE) ×1 IMPLANT
GLOVE BIOGEL PI INDICATOR 7.0 (GLOVE) ×2
GLOVE BIOGEL PI INDICATOR 7.5 (GLOVE) ×2
GLOVE BIOGEL PI INDICATOR 8 (GLOVE) ×2
GLOVE EUDERMIC 7 POWDERFREE (GLOVE) ×4 IMPLANT
GLOVE SURG SS PI 7.0 STRL IVOR (GLOVE) ×3 IMPLANT
GOWN STRL REUS W/ TWL LRG LVL3 (GOWN DISPOSABLE) ×4 IMPLANT
GOWN STRL REUS W/ TWL XL LVL3 (GOWN DISPOSABLE) ×3 IMPLANT
GOWN STRL REUS W/TWL LRG LVL3 (GOWN DISPOSABLE) ×8
GOWN STRL REUS W/TWL XL LVL3 (GOWN DISPOSABLE) ×8
KIT BASIN OR (CUSTOM PROCEDURE TRAY) ×4 IMPLANT
KIT ROOM TURNOVER OR (KITS) ×4 IMPLANT
NS IRRIG 1000ML POUR BTL (IV SOLUTION) ×4 IMPLANT
OMNIPAQUE 50 ML IMPLANT
PAD ARMBOARD 7.5X6 YLW CONV (MISCELLANEOUS) ×4 IMPLANT
POUCH SPECIMEN RETRIEVAL 10MM (ENDOMECHANICALS) ×4 IMPLANT
SCISSORS LAP 5X35 DISP (ENDOMECHANICALS) ×4 IMPLANT
SET CHOLANGIOGRAPH 5 50 .035 (SET/KITS/TRAYS/PACK) ×1 IMPLANT
SET IRRIG TUBING LAPAROSCOPIC (IRRIGATION / IRRIGATOR) ×4 IMPLANT
SLEEVE ENDOPATH XCEL 5M (ENDOMECHANICALS) ×4 IMPLANT
SPECIMEN JAR SMALL (MISCELLANEOUS) ×4 IMPLANT
SUT MNCRL AB 4-0 PS2 18 (SUTURE) ×4 IMPLANT
SUT VICRYL 0 UR6 27IN ABS (SUTURE) ×3 IMPLANT
TOWEL OR 17X24 6PK STRL BLUE (TOWEL DISPOSABLE) ×4 IMPLANT
TOWEL OR 17X26 10 PK STRL BLUE (TOWEL DISPOSABLE) ×4 IMPLANT
TRAY LAPAROSCOPIC MC (CUSTOM PROCEDURE TRAY) ×4 IMPLANT
TROCAR XCEL BLUNT TIP 100MML (ENDOMECHANICALS) ×4 IMPLANT
TROCAR XCEL NON-BLD 11X100MML (ENDOMECHANICALS) ×4 IMPLANT
TROCAR XCEL NON-BLD 5MMX100MML (ENDOMECHANICALS) ×4 IMPLANT
TUBING INSUFFLATION (TUBING) ×4 IMPLANT

## 2015-10-09 NOTE — Progress Notes (Signed)
2 Days Post-Op  Subjective: Alert and states he is comfortable.  Denies nausea and states he is hungry. Appreciate cardiology evaluation. Lab work reveals WBC 8400, hemoglobin 14.3, creatinine 1.01, glucose 102, potassium 4.0. Dr. Jones Broom states that he is probably as tuned up as we can get him and that it is okay to proceed with laparoscopic cholecystectomy today. He is obviously at moderate risk for cardiac and surgical complications. I once again discussed the indications, details, techniques and numerous risk of surgery with the patient.  He understands all of this, all of his questions are answered and he agrees with this plan.  Objective: Vital signs in last 24 hours: Temp:  [97.3 F (36.3 C)-98.2 F (36.8 C)] 97.8 F (36.6 C) (03/09 0744) Pulse Rate:  [55-73] 62 (03/09 0400) Resp:  [18-20] 20 (03/09 0744) BP: (83-104)/(60-77) 104/70 mmHg (03/09 0400) SpO2:  [91 %-100 %] 96 % (03/09 0744) Weight:  [110.859 kg (244 lb 6.4 oz)] 110.859 kg (244 lb 6.4 oz) (03/09 0355) Last BM Date: 10/06/15  Intake/Output from previous day: 03/08 0701 - 03/09 0700 In: 315 [P.O.:240; IV Piggyback:75] Out: 950 [Urine:950] Intake/Output this shift:    General appearance: Alert.  Oriented.  No respiratory difficulty.  Mental status normal.  No distress. Resp: clear to auscultation bilaterally GI: Soft.  Subjectively tender in the right upper quadrant but no mass.  A little tender to percuss the right costal margin not distended.  No scars.  No hernias.  Lab Results:   Recent Labs  10/08/15 0430 10/09/15 0435  WBC 11.1* 8.4  HGB 13.9 14.3  HCT 43.0 42.7  PLT 151 156   BMET  Recent Labs  10/08/15 0430 10/09/15 0435  NA 138 141  K 4.0 4.0  CL 102 102  CO2 25 24  GLUCOSE 169* 102*  BUN 30* 21*  CREATININE 1.16 1.01  CALCIUM 8.2* 8.3*   PT/INR  Recent Labs  10/07/15 0608  LABPROT 17.5*  INR 1.43   ABG No results for input(s): PHART, HCO3 in the last 72 hours.  Invalid  input(s): PCO2, PO2  Studies/Results: Dg Chest Port 1 View  10/07/2015  CLINICAL DATA:  PICC line placement. Acute on chronic systolic congestive heart failure. Coronary artery disease. Diabetes and hypertension. EXAM: PORTABLE CHEST 1 VIEW COMPARISON:  10/06/2015 FINDINGS: New right arm PICC line is seen with tip overlying expected region of superior cavoatrial junction. Low lung volumes are seen. Cardiomegaly is stable. Transvenous pacemaker remains in appropriate position. No evidence of pulmonary consolidation or edema. No evidence of pneumothorax or pleural effusion. IMPRESSION: Cardiomegaly and low lung volumes. New right arm PICC line tip seen in expected region of superior cavoatrial junction. Electronically Signed   By: Myles Rosenthal M.D.   On: 10/07/2015 16:12    Anti-infectives: Anti-infectives    Start     Dose/Rate Route Frequency Ordered Stop   10/08/15 0830  piperacillin-tazobactam (ZOSYN) IVPB 3.375 g     3.375 g 12.5 mL/hr over 240 Minutes Intravenous 3 times per day 10/08/15 0825     10/06/15 0930  cefTRIAXone (ROCEPHIN) 2 g in dextrose 5 % 50 mL IVPB     2 g 100 mL/hr over 30 Minutes Intravenous  Once 10/06/15 0929 10/06/15 1015      Assessment/Plan: s/p Procedure(s): Left Heart Cath and Coronary Angiography   Cholelithiasis, biliary colic, probable acute cholecystitis. Proceed with laparoscopic cholecystectomy with possible cholangiogram, possible open cholecystectomy today. I discussed the indications, details, techniques, and numerous risk of  the surgery with the patient. He is aware the risk of bleeding, infection, conversion to open laparotomy, wound healing problems and hernia, injury to adjacent organs with major reconstructive surgery, and obviously cardiac pulmonary and thromboembolic problems. He understands all these issues well. All his questions are answered. He agrees with this plan.    Cardiomyopathy, congestive heart failure, hypertension,  biventricular ICD, CAD LHC 10/07/2015-  1. Prox RCA to Mid RCA lesion, 20% stenosed. 2. Dist RCA lesion, 90% stenosed. 3. RPDA lesion, 90% stenosed. 4. 1st Diag lesion, 100% stenosed. 5. Mid LAD lesion, 95% stenosed. 6. Dist LAD lesion, 90% stenosed. 7. Ost Cx lesion, 60% stenosed. 8. 2nd Mrg lesion, 95% stenosed. 9. Prox Cx to Mid Cx lesion, 60% stenosed.  Cardiac status stable for surgery.  Obviously at increased risk due to cardiomyopathy and coronary artery disease.  Risk felt to be acceptable by cardiology.  Cholecystectomy will be definitive and will allow coronary interventions and anticoagulation in the future.  Diabetes mellitus without complication.   LOS: 3 days    Benjamin [name] M 10/09/2015  

## 2015-10-09 NOTE — Progress Notes (Signed)
Pt to OR, consents signed, CHG bath done. Wife at pts bedside.

## 2015-10-09 NOTE — Interval H&P Note (Signed)
History and Physical Interval Note:  10/09/2015 3:08 PM  Benjamin [name]  has presented today for surgery, with the diagnosis of cholecystitis  The various methods of treatment have been discussed with the patient and family. After consideration of risks, benefits and other options for treatment, the patient has consented to  Procedure(s): LAPAROSCOPIC CHOLECYSTECTOMY WITH INTRAOPERATIVE CHOLANGIOGRAM (N/A) as a surgical intervention .  The patient's history has been reviewed, patient examined, no change in status, stable for surgery.  I have reviewed the patient's chart and labs.  Questions were answered to the patient's satisfaction.     Ernestene Mention

## 2015-10-09 NOTE — Anesthesia Preprocedure Evaluation (Addendum)
Anesthesia Evaluation  Patient identified by MRN, date of birth, ID band Patient awake    Reviewed: Allergy & Precautions, H&P , NPO status , Patient's Chart, lab work & pertinent test results, reviewed documented beta blocker date and time   Airway Mallampati: II  TM Distance: >3 FB Neck ROM: full    Dental no notable dental hx. (+) Dental Advisory Given, Teeth Intact   Pulmonary neg pulmonary ROS, shortness of breath and with exertion,    Pulmonary exam normal breath sounds clear to auscultation       Cardiovascular Exercise Tolerance: Poor hypertension, Pt. on home beta blockers + CAD and +CHF  Normal cardiovascular exam+ Cardiac Defibrillator  Rhythm:regular Rate:Normal  EF 20%   Neuro/Psych negative neurological ROS  negative psych ROS   GI/Hepatic negative GI ROS, Neg liver ROS,   Endo/Other  diabetes, Well Controlled, Type 2, Insulin Dependent  Renal/GU negative Renal ROS  negative genitourinary   Musculoskeletal   Abdominal   Peds  Hematology negative hematology ROS (+)   Anesthesia Other Findings   Reproductive/Obstetrics negative OB ROS                           Anesthesia Physical Anesthesia Plan  ASA: IV  Anesthesia Plan: General   Post-op Pain Management:    Induction: Intravenous  Airway Management Planned: Oral ETT  Additional Equipment: Arterial line  Intra-op Plan:   Post-operative Plan: Extubation in OR  Informed Consent: I have reviewed the patients History and Physical, chart, labs and discussed the procedure including the risks, benefits and alternatives for the proposed anesthesia with the patient or authorized representative who has indicated his/her understanding and acceptance.   Dental Advisory Given  Plan Discussed with: CRNA and Surgeon  Anesthesia Plan Comments: (Good chance of ICU placement post op)      Anesthesia Quick Evaluation

## 2015-10-09 NOTE — Anesthesia Postprocedure Evaluation (Signed)
Anesthesia Post Note  Patient: Benjamin [name]  Procedure(s) Performed: Procedure(s): LAPAROSCOPIC CHOLECYSTECTOMY  Patient location during evaluation: PACU Anesthesia Type: General Level of consciousness: awake and alert Pain management: pain level controlled Vital Signs Assessment: post-procedure vital signs reviewed and stable Respiratory status: spontaneous breathing, nonlabored ventilation, respiratory function stable and patient connected to nasal cannula oxygen Cardiovascular status: blood pressure returned to baseline and stable Postop Assessment: no signs of nausea or vomiting Anesthetic complications: no    Last Vitals:  Filed Vitals:   10/09/15 1845 10/09/15 2000  BP:  109/82  Pulse:    Temp: 36.7 C 36.8 C  Resp:  21    Last Pain:  Filed Vitals:   10/09/15 2020  PainSc: 6                  Bryn Perkin L

## 2015-10-09 NOTE — H&P (View-Only) (Signed)
2 Days Post-Op  Subjective: Alert and states he is comfortable.  Denies nausea and states he is hungry. Appreciate cardiology evaluation. Lab work reveals WBC 8400, hemoglobin 14.3, creatinine 1.01, glucose 102, potassium 4.0. Dr. Jones Broom states that he is probably as tuned up as we can get him and that it is okay to proceed with laparoscopic cholecystectomy today. He is obviously at moderate risk for cardiac and surgical complications. I once again discussed the indications, details, techniques and numerous risk of surgery with the patient.  He understands all of this, all of his questions are answered and he agrees with this plan.  Objective: Vital signs in last 24 hours: Temp:  [97.3 F (36.3 C)-98.2 F (36.8 C)] 97.8 F (36.6 C) (03/09 0744) Pulse Rate:  [55-73] 62 (03/09 0400) Resp:  [18-20] 20 (03/09 0744) BP: (83-104)/(60-77) 104/70 mmHg (03/09 0400) SpO2:  [91 %-100 %] 96 % (03/09 0744) Weight:  [110.859 kg (244 lb 6.4 oz)] 110.859 kg (244 lb 6.4 oz) (03/09 0355) Last BM Date: 10/06/15  Intake/Output from previous day: 03/08 0701 - 03/09 0700 In: 315 [P.O.:240; IV Piggyback:75] Out: 950 [Urine:950] Intake/Output this shift:    General appearance: Alert.  Oriented.  No respiratory difficulty.  Mental status normal.  No distress. Resp: clear to auscultation bilaterally GI: Soft.  Subjectively tender in the right upper quadrant but no mass.  A little tender to percuss the right costal margin not distended.  No scars.  No hernias.  Lab Results:   Recent Labs  10/08/15 0430 10/09/15 0435  WBC 11.1* 8.4  HGB 13.9 14.3  HCT 43.0 42.7  PLT 151 156   BMET  Recent Labs  10/08/15 0430 10/09/15 0435  NA 138 141  K 4.0 4.0  CL 102 102  CO2 25 24  GLUCOSE 169* 102*  BUN 30* 21*  CREATININE 1.16 1.01  CALCIUM 8.2* 8.3*   PT/INR  Recent Labs  10/07/15 0608  LABPROT 17.5*  INR 1.43   ABG No results for input(s): PHART, HCO3 in the last 72 hours.  Invalid  input(s): PCO2, PO2  Studies/Results: Dg Chest Port 1 View  10/07/2015  CLINICAL DATA:  PICC line placement. Acute on chronic systolic congestive heart failure. Coronary artery disease. Diabetes and hypertension. EXAM: PORTABLE CHEST 1 VIEW COMPARISON:  10/06/2015 FINDINGS: New right arm PICC line is seen with tip overlying expected region of superior cavoatrial junction. Low lung volumes are seen. Cardiomegaly is stable. Transvenous pacemaker remains in appropriate position. No evidence of pulmonary consolidation or edema. No evidence of pneumothorax or pleural effusion. IMPRESSION: Cardiomegaly and low lung volumes. New right arm PICC line tip seen in expected region of superior cavoatrial junction. Electronically Signed   By: Myles Rosenthal M.D.   On: 10/07/2015 16:12    Anti-infectives: Anti-infectives    Start     Dose/Rate Route Frequency Ordered Stop   10/08/15 0830  piperacillin-tazobactam (ZOSYN) IVPB 3.375 g     3.375 g 12.5 mL/hr over 240 Minutes Intravenous 3 times per day 10/08/15 0825     10/06/15 0930  cefTRIAXone (ROCEPHIN) 2 g in dextrose 5 % 50 mL IVPB     2 g 100 mL/hr over 30 Minutes Intravenous  Once 10/06/15 0929 10/06/15 1015      Assessment/Plan: s/p Procedure(s): Left Heart Cath and Coronary Angiography   Cholelithiasis, biliary colic, probable acute cholecystitis. Proceed with laparoscopic cholecystectomy with possible cholangiogram, possible open cholecystectomy today. I discussed the indications, details, techniques, and numerous risk of  the surgery with the patient. He is aware the risk of bleeding, infection, conversion to open laparotomy, wound healing problems and hernia, injury to adjacent organs with major reconstructive surgery, and obviously cardiac pulmonary and thromboembolic problems. He understands all these issues well. All his questions are answered. He agrees with this plan.    Cardiomyopathy, congestive heart failure, hypertension,  biventricular ICD, CAD LHC 10/07/2015-  1. Prox RCA to Mid RCA lesion, 20% stenosed. 2. Dist RCA lesion, 90% stenosed. 3. RPDA lesion, 90% stenosed. 4. 1st Diag lesion, 100% stenosed. 5. Mid LAD lesion, 95% stenosed. 6. Dist LAD lesion, 90% stenosed. 7. Ost Cx lesion, 60% stenosed. 8. 2nd Mrg lesion, 95% stenosed. 9. Prox Cx to Mid Cx lesion, 60% stenosed.  Cardiac status stable for surgery.  Obviously at increased risk due to cardiomyopathy and coronary artery disease.  Risk felt to be acceptable by cardiology.  Cholecystectomy will be definitive and will allow coronary interventions and anticoagulation in the future.  Diabetes mellitus without complication.   LOS: 3 days    Siria Calandro M 10/09/2015

## 2015-10-09 NOTE — Anesthesia Postprocedure Evaluation (Signed)
Anesthesia Post Note  Patient: Benjamin [name]  Procedure(s) Performed: Procedure(s): LAPAROSCOPIC CHOLECYSTECTOMY  Patient location during evaluation: PACU Anesthesia Type: General Level of consciousness: awake and alert Pain management: pain level controlled Vital Signs Assessment: post-procedure vital signs reviewed and stable Respiratory status: spontaneous breathing, nonlabored ventilation, respiratory function stable and patient connected to nasal cannula oxygen Cardiovascular status: blood pressure returned to baseline and stable Postop Assessment: no signs of nausea or vomiting Anesthetic complications: no    Last Vitals:  Filed Vitals:   10/09/15 1842 10/09/15 1845  BP:    Pulse: 63   Temp:  36.7 C  Resp:      Last Pain:  Filed Vitals:   10/09/15 1857  PainSc: 0-No pain                 Brnadon Eoff,W. EDMOND

## 2015-10-09 NOTE — Anesthesia Procedure Notes (Signed)
Procedure Name: Intubation Date/Time: 10/09/2015 4:18 PM Performed by: Coralee Rud Pre-anesthesia Checklist: Emergency Drugs available, Patient identified, Suction available and Patient being monitored Patient Re-evaluated:Patient Re-evaluated prior to inductionOxygen Delivery Method: Circle system utilized Preoxygenation: Pre-oxygenation with 100% oxygen Intubation Type: IV induction Ventilation: Mask ventilation without difficulty and Oral airway inserted - appropriate to patient size Laryngoscope Size: Miller and 3 Grade View: Grade II Tube type: Oral Tube size: 7.5 mm Number of attempts: 1 Airway Equipment and Method: Stylet Placement Confirmation: ETT inserted through vocal cords under direct vision,  positive ETCO2 and breath sounds checked- equal and bilateral Secured at: 0 cm Tube secured with: Tape Dental Injury: Teeth and Oropharynx as per pre-operative assessment

## 2015-10-09 NOTE — Transfer of Care (Signed)
Immediate Anesthesia Transfer of Care Note  Patient: Benjamin [name]  Procedure(s) Performed: Procedure(s): LAPAROSCOPIC CHOLECYSTECTOMY  Patient Location: PACU  Anesthesia Type:General  Level of Consciousness: awake, alert , oriented and patient cooperative  Airway & Oxygen Therapy: Patient Spontanous Breathing and Patient connected to nasal cannula oxygen  Post-op Assessment: Report given to RN, Post -op Vital signs reviewed and stable and Patient moving all extremities  Post vital signs: Reviewed and stable  Last Vitals:  Filed Vitals:   10/09/15 0744 10/09/15 1134  BP:  104/69  Pulse:    Temp: 36.6 C 36.4 C  Resp: 20 18    Complications: No apparent anesthesia complications

## 2015-10-09 NOTE — Progress Notes (Signed)
Pt back from surgery, AAOx4, NAD, family at bedside, pt in pain morphine iv given as ordered, labs sent as ordered. VSS. Op sites CDI. Will cont to monitor closely.

## 2015-10-09 NOTE — Progress Notes (Signed)
Advanced Heart Failure Rounding Note   Subjective:    Feels fine today. Denies SOB. No CP. Ready for surgery. Co-ox back down slightly at 51% CVP 10-11   Objective:   Weight Range:  Vital Signs:   Temp:  [97.3 F (36.3 C)-98.2 F (36.8 C)] 97.5 F (36.4 C) (03/09 1134) Pulse Rate:  [58-73] 62 (03/09 0400) Resp:  [18-20] 18 (03/09 1134) BP: (90-111)/(62-77) 104/69 mmHg (03/09 1134) SpO2:  [91 %-100 %] 98 % (03/09 1134) Weight:  [110.859 kg (244 lb 6.4 oz)] 110.859 kg (244 lb 6.4 oz) (03/09 0355) Last BM Date: 10/06/15  Weight change: Filed Weights   10/07/15 2100 10/08/15 0209 10/09/15 0355  Weight: 112.628 kg (248 lb 4.8 oz) 112.6 kg (248 lb 3.8 oz) 110.859 kg (244 lb 6.4 oz)    Intake/Output:   Intake/Output Summary (Last 24 hours) at 10/09/15 1505 Last data filed at 10/09/15 1000  Gross per 24 hour  Intake  112.5 ml  Output   1300 ml  Net -1187.5 ml     Physical Exam: CVP 8-10 General:  Well appearing. No resp difficulty.  Sitting in the chair.  HEENT: normal Neck: supple. JVP 8-10  . Carotids 2+ bilat; no bruits. No lymphadenopathy or thryomegaly appreciated. Cor: PMI nondisplaced. Regular rate & rhythm. No rubs, gallops or murmurs. Lungs: clear Abdomen: soft, nontender, nondistended. No hepatosplenomegaly. No bruits or masses. Good bowel sounds. Extremities: no cyanosis, clubbing, rash, R and LLE no edema.  Neuro: alert & orientedx3, cranial nerves grossly intact. moves all 4 extremities w/o difficulty. Affect pleasant  Telemetry: NSR 70s   Labs: Basic Metabolic Panel:  Recent Labs Lab 10/06/15 0650 10/06/15 1727 10/07/15 0608 10/08/15 0430 10/09/15 0435  NA 141  --  139 138 141  K 5.0  --  4.3 4.0 4.0  CL 104  --  104 102 102  CO2 25  --  GLUCOSE 173*  --  140* 169* 102*  BUN 28*  --  27* 30* 21*  CREATININE 1.08  --  1.42* 1.16 1.01  CALCIUM 9.3  --  8.8* 8.2* 8.3*  MG  --  1.7  --  2.0  --   PHOS  --  4.7*  --   --   --      Liver Function Tests:  Recent Labs Lab 10/06/15 0650 10/07/15 0608 10/08/15 0430 10/09/15 0435  AST ALT 22 17 15* 22  ALKPHOS 53 48 40 47  BILITOT 1.2 1.4* 0.8 1.0  PROT 6.8 6.3* 5.4* 5.9*  ALBUMIN 4.0 3.3* 2.8* 2.8*    Recent Labs Lab 10/06/15 0650 10/07/15 0608  LIPASE 22 20   No results for input(s): AMMONIA in the last 168 hours.  CBC:  Recent Labs Lab 10/06/15 0650 10/07/15 0608 10/08/15 0430 10/09/15 0435  WBC 8.4 13.5* 11.1* 8.4  NEUTROABS 6.8  --   --   --   HGB 15.0 15.5 13.9 14.3  HCT 46.4 48.0 43.0 42.7  MCV 76.1* 77.5* 76.1* 76.5*  PLT 175 185 151 156    Cardiac Enzymes: No results for input(s): CKTOTAL, CKMB, CKMBINDEX, TROPONINI in the last 168 hours.  BNP: BNP (last 3 results)  Recent Labs  10/06/15 1939  BNP 1374.4*    ProBNP (last 3 results) No results for input(s): PROBNP in the last 8760 hours.    Other results:  Imaging: Dg Chest Port 1 View  10/07/2015  CLINICAL DATA:  PICC line placement. Acute on chronic systolic congestive heart failure. Coronary artery disease. Diabetes and hypertension. EXAM: PORTABLE CHEST 1 VIEW COMPARISON:  10/06/2015 FINDINGS: New right arm PICC line is seen with tip overlying expected region of superior cavoatrial junction. Low lung volumes are seen. Cardiomegaly is stable. Transvenous pacemaker remains in appropriate position. No evidence of pulmonary consolidation or edema. No evidence of pneumothorax or pleural effusion. IMPRESSION: Cardiomegaly and low lung volumes. New right arm PICC line tip seen in expected region of superior cavoatrial junction. Electronically Signed   By: Myles Rosenthal M.D.   On: 10/07/2015 16:12     Medications:     Scheduled Medications: . [MAR Hold] aspirin  81 mg Oral Daily  . [MAR Hold] carvedilol  12.5 mg Oral BID WC  . [START ON 10/10/2015] chlorhexidine  1 application Topical Once  . [MAR Hold] heparin subcutaneous  5,000 Units Subcutaneous 3 times  per day  . [MAR Hold] insulin aspart  0-20 Units Subcutaneous TID WC  . [MAR Hold] insulin aspart  0-5 Units Subcutaneous QHS  . [MAR Hold] insulin glargine  10 Units Subcutaneous QHS  . [MAR Hold] piperacillin-tazobactam (ZOSYN)  IV  3.375 g Intravenous 3 times per day  . [MAR Hold] pregabalin  150 mg Oral QHS  . [MAR Hold] rosuvastatin  40 mg Oral q1800  . [MAR Hold] sodium chloride flush  10-40 mL Intracatheter Q12H  . [MAR Hold] sodium chloride flush  3 mL Intravenous Q12H  . [MAR Hold] sodium chloride flush  3 mL Intravenous Q12H    Infusions: . lactated ringers 10 mL/hr at 10/09/15 1502    PRN Medications: [MAR Hold] sodium chloride, [DISCONTINUED] acetaminophen **OR** [MAR Hold] acetaminophen, [MAR Hold] acetaminophen, [MAR Hold]  morphine injection, [MAR Hold] ondansetron (ZOFRAN) IV, [MAR Hold] ondansetron **OR** [DISCONTINUED] ondansetron (ZOFRAN) IV, [MAR Hold] oxyCODONE-acetaminophen, [MAR Hold] sodium chloride flush, [MAR Hold] sodium chloride flush   Assessment:   1. Acute cholecystitis 2.CAD- Multivessel CAD 3. A/C Systolic Heart Failure 4. HTN 5. Hyperlipidemia   Plan/Discussion:     Stable. Co-ox down again but not critical. Feels ago. Hemodynamically stable. Will let him go to surgery today. Continue current regimen.   Will likely need some diuresis after surgery.   Once he recovers from chole plan 2-V PCI in several weeks.   Case d/w Surgery and the interventional team. CVP and co-oxs checked personally.   Length of Stay: 3   Shallyn Constancio MD 10/09/2015, 3:05 PM   Advanced Heart Failure Team Pager [phone] (M-F; 7a - 4p)  Please contact [facility] for night-coverage after hours (4p -7a ) and weekends on [email or web address]

## 2015-10-09 NOTE — Progress Notes (Signed)
[facility] PROGRESS NOTE  Benjamin [name]:[id number] DOB: 1960/12/15 DOA: 10/06/2015 PCP: Jeanann Lewandowsky, MD  Length of stay: 3   Assessment/Plan: Active Problems:   Essential hypertension   DM (diabetes mellitus) type 2, uncontrolled, with ketoacidosis (HCC)   Cholecystitis   Neuropathy (HCC)   Acute on chronic systolic heart failure (HCC)   Preoperative clearance   Preop cardiovascular exam   Coronary artery disease involving native coronary artery of native heart without angina pectoris   Brief summary 55 y.o. male with type II diabetes ,HTN, chronic systolic heart failure with ICD. Followed by Dr. Lewayne Bunting. Patient seen today at [facility] today with nausea, vomiting and upper abdominal pain. The pain started around 6 AM this morning, it was located in the right upper quadrant. Radiation through to the back . Patient had similar pain about 6 months ago, told it was his gallbladder but nothing needed to be done unless "it became infected ". Right upper quadrant pain was severe, constant, unrelated to meals .  Marland Kitchen In ED, ultrasound reveals cholelithiasis and mild gallbladder wall thickening.  Patient also with a possible cholecystectomy. Cardiology consulted for clearance.  Assessment and plan Cholelithiasis/cholecystitis. Patient being followed by general surgery  plan is for laparoscopic cholecystectomy after clearance by cardiology, status post cardiac cath, needs optimization of his heart failure prior to proceeding with surgery,  Continue clear liquid diet Continue Zosyn for acute cholecystitis Proceed with laparoscopic cholecystectomy with possible cholangiogram, possible open cholecystectomy today. Risk felt to be acceptable by cardiology   Chronic systolic heart failure / left bundle branch block, s/p BiV ICD.  August 2016 ejection fraction was 20% with diffuse hypokinesis. Mild LVH. Mild MR. Left atrium severely dilated, Cath with severe 2V CAD  in the LAD and RCA. Currently not having any angina No pulmonary edema on CXR. No edema. Chronic shortness of breath (unchanged). Heart failure team following, medical management per cardiology Plan was tooptimize his HF and let him proceed with lap chole , acknowledging a moderate to high peri-operative risk of CV complications Continue Coreg, IV Lasix, Aldactone,  Acute kidney injury Creatinine has increased from 1.08> 1.4> 1.16 Continue Lasix for diuresis,Hold benicar   Type 2 diabetes. Hgb a1c in Epic late August was 8.9. Hemoglobin A1c 7.3 -Hold home metformin and home basal insulin -Monitor CBGs, sliding scale insulin - resistant  Hold Lantus today, may give 10  units postoperatively  Hypertension. Elevated BP today.  Continue Coreg,    Hyperlipidemia. -Hold home statin for now  Neuropathy. -continue home Lyrica    DVT prophylaxsis   Heparin subcutaneous  Code Status:      Code Status Orders        Start     Ordered   10/06/15 1710  Full code   Continuous     10/06/15 1711     Family Communication: Discussed in detail with the patient, all imaging results, lab results explained to the patient   Disposition Plan: Pending further recommendations from surgery      Consultants:  Cardiology  General surgery    Procedures:  None  Antibiotics: Anti-infectives    Start     Dose/Rate Route Frequency Ordered Stop   10/08/15 0830  piperacillin-tazobactam (ZOSYN) IVPB 3.375 g     3.375 g 12.5 mL/hr over 240 Minutes Intravenous 3 times per day 10/08/15 0825     10/06/15 0930  cefTRIAXone (ROCEPHIN) 2 g in dextrose 5 % 50  mL IVPB     2 g 100 mL/hr over 30 Minutes Intravenous  Once 10/06/15 0929 10/06/15 1015         HPI/Subjective: Resting comfortably, denies any nausea vomiting abdominal pain, scheduled for laparoscopic cholecystectomy today   Objective: Filed Vitals:   10/09/15 0355 10/09/15 0400 10/09/15 0743 10/09/15 0744  BP: 104/77  104/70 111/77   Pulse: 63 62    Temp: 97.3 F (36.3 C)   97.8 F (36.6 C)  TempSrc: Oral   Oral  Resp: 18   20  Height:      Weight: 110.859 kg (244 lb 6.4 oz)     SpO2: 97% 98%  96%    Intake/Output Summary (Last 24 hours) at 10/09/15 0959 Last data filed at 10/09/15 0800  Gross per 24 hour  Intake  327.5 ml  Output    950 ml  Net -622.5 ml    Exam:  General: Well appearing. No resp difficulty. In bed  HEENT: normal Neck: supple. JVP 8-9 . Carotids 2+ bilat; no bruits. No lymphadenopathy or thryomegaly appreciated. Cor: PMI nondisplaced. Regular rate & rhythm. No rubs, gallops or murmurs. Lungs: clear Abdomen: soft, nontender, nondistended. No hepatosplenomegaly. No bruits or masses. Good bowel sounds. Extremities: no cyanosis, clubbing, rash, R and LLE trace-1+ edema Neuro: alert & orientedx3, cranial nerves grossly intact. moves all 4 extremities w/o difficulty. Affect pleasant  Data Review   Micro Results Recent Results (from the past 240 hour(s))  MRSA PCR Screening     Status: None   Collection Time: 10/07/15 10:10 PM  Result Value Ref Range Status   MRSA by PCR NEGATIVE NEGATIVE Final    Comment:        The GeneXpert MRSA Assay (FDA approved for NASAL specimens only), is one component of a comprehensive MRSA colonization surveillance program. It is not intended to diagnose MRSA infection nor to guide or monitor treatment for MRSA infections.     Radiology Reports Dg Chest 2 View  10/06/2015  CLINICAL DATA:  Preop for cholelithiasis EXAM: CHEST  2 VIEW COMPARISON:  07/14/2015 FINDINGS: Cardiomegaly again noted. Central mild vascular congestion without convincing pulmonary edema. 3 leads cardiac pacemaker is unchanged in position. Mild basilar atelectasis. No segmental infiltrate. IMPRESSION: Cardiomegaly. Three leads cardiac pacemaker in place. Central mild vascular congestion without pulmonary edema. Mild basilar atelectasis. Electronically Signed   By:  Natasha Mead M.D.   On: 10/06/2015 13:23   Dg Chest Port 1 View  10/07/2015  CLINICAL DATA:  PICC line placement. Acute on chronic systolic congestive heart failure. Coronary artery disease. Diabetes and hypertension. EXAM: PORTABLE CHEST 1 VIEW COMPARISON:  10/06/2015 FINDINGS: New right arm PICC line is seen with tip overlying expected region of superior cavoatrial junction. Low lung volumes are seen. Cardiomegaly is stable. Transvenous pacemaker remains in appropriate position. No evidence of pulmonary consolidation or edema. No evidence of pneumothorax or pleural effusion. IMPRESSION: Cardiomegaly and low lung volumes. New right arm PICC line tip seen in expected region of superior cavoatrial junction. Electronically Signed   By: Myles Rosenthal M.D.   On: 10/07/2015 16:12   US Abdomen Limited Ruq  10/06/2015  CLINICAL DATA:  Right upper quadrant pain for 1 day EXAM: US ABDOMEN LIMITED - RIGHT UPPER QUADRANT COMPARISON:  08/10/2014 FINDINGS: Gallbladder: Stable gallstone is again identified. Mild wall thickening is noted to 4 mm. A positive sonographic Eulah Pont sign is seen. Common bile duct: Diameter: 4 mm Liver: No focal lesion identified. Within normal limits  in parenchymal echogenicity. Incidental note is again made of a right renal calculus measuring 9 mm. It is nonobstructive in nature. IMPRESSION: Cholelithiasis with positive sonographic Murphy's sign and mild wall thickening. Right renal stone. Electronically Signed   By: Alcide Clever M.D.   On: 10/06/2015 08:52     CBC  Recent Labs Lab 10/06/15 0650 10/07/15 0608 10/08/15 0430 10/09/15 0435  WBC 8.4 13.5* 11.1* 8.4  HGB 15.0 15.5 13.9 14.3  HCT 46.4 48.0 43.0 42.7  PLT 175 185 151 156  MCV 76.1* 77.5* 76.1* 76.5*  MCH 24.6* 25.0* 24.6* 25.6*  MCHC 32.3 32.3 32.3 33.5  RDW 20.1* 18.8* 18.4* 18.0*  LYMPHSABS 0.8  --   --   --   MONOABS 0.5  --   --   --   EOSABS 0.3  --   --   --   BASOSABS 0.0  --   --   --     Chemistries   Recent  Labs Lab 10/06/15 0650 10/06/15 1727 10/07/15 0608 10/08/15 0430 10/09/15 0435  NA 141  --  139 138 141  K 5.0  --  4.3 4.0 4.0  CL 104  --  104 102 102  CO2 25  --  23 25 24   GLUCOSE 173*  --  140* 169* 102*  BUN 28*  --  27* 30* 21*  CREATININE 1.08  --  1.42* 1.16 1.01  CALCIUM 9.3  --  8.8* 8.2* 8.3*  MG  --  1.7  --  2.0  --   AST 23  --  25 19 24   ALT 22  --  17 15* 22  ALKPHOS 53  --  48 40 47  BILITOT 1.2  --  1.4* 0.8 1.0   ------------------------------------------------------------------------------------------------------------------ estimated creatinine clearance is 100.5 mL/min (by C-G formula based on Cr of 1.01). ------------------------------------------------------------------------------------------------------------------  Recent Labs  10/06/15 1727  HGBA1C 7.3*   ------------------------------------------------------------------------------------------------------------------ No results for input(s): CHOL, HDL, LDLCALC, TRIG, CHOLHDL, LDLDIRECT in the last 72 hours. ------------------------------------------------------------------------------------------------------------------ No results for input(s): TSH, T4TOTAL, T3FREE, THYROIDAB in the last 72 hours.  Invalid input(s): FREET3 ------------------------------------------------------------------------------------------------------------------ No results for input(s): VITAMINB12, FOLATE, FERRITIN, TIBC, IRON, RETICCTPCT in the last 72 hours.  Coagulation profile  Recent Labs Lab 10/07/15 0608  INR 1.43    No results for input(s): DDIMER in the last 72 hours.  Cardiac Enzymes No results for input(s): CKMB, TROPONINI, MYOGLOBIN in the last 168 hours.  Invalid input(s): CK ------------------------------------------------------------------------------------------------------------------ Invalid input(s): POCBNP   CBG:  Recent Labs Lab 10/08/15 0816 10/08/15 1149 10/08/15 1715  10/08/15 2141 10/09/15 0743  GLUCAP 138* 156* 125* 154* 93       Studies: Dg Chest Port 1 View  10/07/2015  CLINICAL DATA:  PICC line placement. Acute on chronic systolic congestive heart failure. Coronary artery disease. Diabetes and hypertension. EXAM: PORTABLE CHEST 1 VIEW COMPARISON:  10/06/2015 FINDINGS: New right arm PICC line is seen with tip overlying expected region of superior cavoatrial junction. Low lung volumes are seen. Cardiomegaly is stable. Transvenous pacemaker remains in appropriate position. No evidence of pulmonary consolidation or edema. No evidence of pneumothorax or pleural effusion. IMPRESSION: Cardiomegaly and low lung volumes. New right arm PICC line tip seen in expected region of superior cavoatrial junction. Electronically Signed   By: Myles Rosenthal M.D.   On: 10/07/2015 16:12      Lab Results  Component Value Date   HGBA1C 7.3* 10/06/2015   HGBA1C 8.0 03/27/2015   HGBA1C  7.4 07/22/2014   Lab Results  Component Value Date   LDLCALC 120 03/27/2015   CREATININE 1.01 10/09/2015       Scheduled Meds: . aspirin  81 mg Oral Daily  . carvedilol  12.5 mg Oral BID WC  . heparin subcutaneous  5,000 Units Subcutaneous 3 times per day  . insulin aspart  0-20 Units Subcutaneous TID WC  . insulin aspart  0-5 Units Subcutaneous QHS  . insulin glargine  20 Units Subcutaneous Daily  . piperacillin-tazobactam (ZOSYN)  IV  3.375 g Intravenous 3 times per day  . pregabalin  150 mg Oral QHS  . rosuvastatin  40 mg Oral q1800  . sodium chloride flush  10-40 mL Intracatheter Q12H  . sodium chloride flush  3 mL Intravenous Q12H  . sodium chloride flush  3 mL Intravenous Q12H   Continuous Infusions:    Active Problems:   Essential hypertension   DM (diabetes mellitus) type 2, uncontrolled, with ketoacidosis (HCC)   Cholecystitis   Neuropathy (HCC)   Acute on chronic systolic heart failure (HCC)   Preoperative clearance   Preop cardiovascular exam   Coronary  artery disease involving native coronary artery of native heart without angina pectoris    Time spent: 45 minutes   [facility]  [facility] Pager [phone]. If 7PM-7AM, please contact night-coverage at [email or web address], password [facility] 10/09/2015, 9:59 AM  LOS: 3 days

## 2015-10-09 NOTE — Op Note (Signed)
Patient Name:           Benjamin [name]   Date of Surgery:        10/09/2015  Pre op Diagnosis:      Acute cholecystitis with cholelithiasis  Post op Diagnosis:    Same  Procedure:                 Laparoscopic cholecystectomy  Surgeon:                     Angelia Mould. Derrell Lolling, M.D., FACS  Assistant:                      Violeta Gelinas, M.D.  Operative Indications:   This is a 55 year old gentleman who was admitted on 10/06/2015 with recurrent episodes of right upper quadrant pain.  This was not relieved by narcotic pain medication.  Denies nausea or vomiting.  Ultrasound showed a single gallstone and mild wall thickening and a positive sonographic Murphy sign.  Common bile duct was 4 mm.  Liver function test are repeatedly normal.  He was admitted and started on IV antibiotics and his pain resolved within 24 hours on bowel rest and antibiotics.  He has significant heart disease with cardiomyopathy, hypertension, diabetes, chronic systolic heart failure.  He has a biventricular ICD implanted last year.  Last echocardiogram showed ejection fraction of 20%.  He has been evaluated by cardiology.  He's had a cardiac catheterization which shows coronary artery disease.  The surgical service and the cardiology service and the medical service have discussed algorithms for care.  Cardiology felt he was stable and recommended that we proceed with cholecystectomy.  We felt this was reasonable and definitive.  He was continuing to have right upper quadrant pain every time he would eat.  Operative Findings:       The gallbladder was acutely inflamed, thick-walled and edematous.  The anatomy of the cystic duct and cystic artery were conventional.  We chose not to do a cholangiogram because of the above described findings.  The liver was firm but not really enlarged.  The stomach, duodenum, small intestine, large intestine were grossly normal to inspection.  Procedure in Detail:          Following the induction of  general endotracheal anesthesia the patient's abdomen was prepped and draped in a sterile fashion.  Left radial arterial line was performed by Dr. Leta Jungling from the anesthesia department.  Surgical timeout was performed.  Intravenous antibiotics were given.  0.5% Marcaine with epinephrine was used as local infiltration anesthetic.      An 11 mm Hassan trocar was placed in the midline just above the umbilicus with an open technique.  This was uneventful and it was secured with the Purstring suture of 0 Vicryl.  Pneumoperitoneum was treated.  Camera was inserted.  Trochars placed in subxiphoid region and two 5 mm trochars placed in the right upper quadrant.  The gallbladder was tense and thick-walled.  I used a suction trocar and evacuated the bile from the gallbladder.  There were then able to manipulate it better.  We slowly dissected all of the adhesions down off of the body and infundibulum of the gallbladder.  We dissected out the cystic duct and the cystic artery and created a nice window behind the structures.  I then secured the cystic duct and cystic artery with multiple medical clips and divided them.  The gallbladder was dissected from its bed with electrocautery  placed in a specimen bag and removed.  The gallbladder bed was raw and inflamed.  We cauterized extensively.  It seemed hemostatic.  There was no bile leak.  I placed a piece of snow hemostatic sponge in the bed of the gallbladder.  At the completion of the case there was no bleeding or bile leak and all of the irrigation fluid was removed.     The pneumoperitoneum was released and the trochars were removed.  I closed the fascia at the umbilicus with 3 sutures of 0 Vicryl.  Skin incisions were closed with subcuticular 4-0 Monocryl and Dermabond.  The patient tolerated the procedure well was taken to PACU in stable condition.  EBL 30 mL.  Counts correct.  Complications none.    Angelia Mould. Derrell Lolling, M.D., FACS General and Minimally Invasive  Surgery Breast and Colorectal Surgery  10/09/2015 5:28 PM

## 2015-10-10 ENCOUNTER — Encounter (HOSPITAL_COMMUNITY): Payer: Self-pay | Admitting: General Surgery

## 2015-10-10 DIAGNOSIS — K819 Cholecystitis, unspecified: Secondary | ICD-10-CM | POA: Diagnosis not present

## 2015-10-10 DIAGNOSIS — I5023 Acute on chronic systolic (congestive) heart failure: Secondary | ICD-10-CM | POA: Diagnosis not present

## 2015-10-10 DIAGNOSIS — E1310 Other specified diabetes mellitus with ketoacidosis without coma: Secondary | ICD-10-CM | POA: Diagnosis not present

## 2015-10-10 DIAGNOSIS — I2510 Atherosclerotic heart disease of native coronary artery without angina pectoris: Secondary | ICD-10-CM | POA: Diagnosis not present

## 2015-10-10 LAB — CBC
HCT: 44 % (ref 39.0–52.0)
HEMATOCRIT: 45.2 % (ref 39.0–52.0)
HEMOGLOBIN: 13.7 g/dL (ref 13.0–17.0)
HEMOGLOBIN: 14.3 g/dL (ref 13.0–17.0)
MCH: 24.2 pg — ABNORMAL LOW (ref 26.0–34.0)
MCH: 24.5 pg — AB (ref 26.0–34.0)
MCHC: 31.1 g/dL (ref 30.0–36.0)
MCHC: 31.6 g/dL (ref 30.0–36.0)
MCV: 77.7 fL — ABNORMAL LOW (ref 78.0–100.0)
MCV: 77.4 fL — AB (ref 78.0–100.0)
Platelets: 151 10*3/uL (ref 150–400)
Platelets: 158 10*3/uL (ref 150–400)
RBC: 5.66 MIL/uL (ref 4.22–5.81)
RBC: 5.84 MIL/uL — ABNORMAL HIGH (ref 4.22–5.81)
RDW: 17.9 % — AB (ref 11.5–15.5)
RDW: 17.9 % — ABNORMAL HIGH (ref 11.5–15.5)
WBC: 8.3 10*3/uL (ref 4.0–10.5)
WBC: 9.1 10*3/uL (ref 4.0–10.5)

## 2015-10-10 LAB — COMPREHENSIVE METABOLIC PANEL
ALBUMIN: 2.8 g/dL — AB (ref 3.5–5.0)
ALK PHOS: 47 U/L (ref 38–126)
ALK PHOS: 49 U/L (ref 38–126)
ALT: 32 U/L (ref 17–63)
ALT: 32 U/L (ref 17–63)
ANION GAP: 12 (ref 5–15)
ANION GAP: 9 (ref 5–15)
AST: 42 U/L — AB (ref 15–41)
AST: 44 U/L — ABNORMAL HIGH (ref 15–41)
Albumin: 2.8 g/dL — ABNORMAL LOW (ref 3.5–5.0)
BILIRUBIN TOTAL: 1.3 mg/dL — AB (ref 0.3–1.2)
BUN: 18 mg/dL (ref 6–20)
BUN: 17 mg/dL (ref 6–20)
CALCIUM: 8.3 mg/dL — AB (ref 8.9–10.3)
CO2: 25 mmol/L (ref 22–32)
CO2: 23 mmol/L (ref 22–32)
Calcium: 8.5 mg/dL — ABNORMAL LOW (ref 8.9–10.3)
Chloride: 103 mmol/L (ref 101–111)
Chloride: 103 mmol/L (ref 101–111)
Creatinine, Ser: 1.01 mg/dL (ref 0.61–1.24)
Creatinine, Ser: 1 mg/dL (ref 0.61–1.24)
GFR calc Af Amer: >60 mL/min (ref >60)
GFR calc non Af Amer: >60 mL/min (ref >60)
GFR calc non Af Amer: >60 mL/min (ref >60)
GLUCOSE: 128 mg/dL — AB (ref 65–99)
Glucose, Bld: 191 mg/dL — ABNORMAL HIGH (ref 65–99)
POTASSIUM: 4.2 mmol/L (ref 3.5–5.1)
Potassium: 4.6 mmol/L (ref 3.5–5.1)
SODIUM: 140 mmol/L (ref 135–145)
Sodium: 135 mmol/L (ref 135–145)
TOTAL PROTEIN: 5.6 g/dL — AB (ref 6.5–8.1)
Total Bilirubin: 1.4 mg/dL — ABNORMAL HIGH (ref 0.3–1.2)
Total Protein: 5.6 g/dL — ABNORMAL LOW (ref 6.5–8.1)

## 2015-10-10 LAB — IRON AND TIBC
IRON: 17 ug/dL — AB (ref 45–182)
Saturation Ratios: 6 % — ABNORMAL LOW (ref 17.9–39.5)
TIBC: 304 ug/dL (ref 250–450)
UIBC: 287 ug/dL

## 2015-10-10 LAB — VITAMIN B12: Vitamin B-12: 363 pg/mL (ref 180–914)

## 2015-10-10 LAB — GLUCOSE, CAPILLARY
GLUCOSE-CAPILLARY: 192 mg/dL — AB (ref 65–99)
GLUCOSE-CAPILLARY: 159 mg/dL — AB (ref 65–99)
Glucose-Capillary: 113 mg/dL — ABNORMAL HIGH (ref 65–99)
Glucose-Capillary: 160 mg/dL — ABNORMAL HIGH (ref 65–99)

## 2015-10-10 LAB — FOLATE: FOLATE: 16.7 ng/mL (ref >5.9)

## 2015-10-10 LAB — CARBOXYHEMOGLOBIN
CARBOXYHEMOGLOBIN: 1.5 % (ref 0.5–1.5)
METHEMOGLOBIN: 0.6 % (ref 0.0–1.5)
O2 Saturation: 56.3 %
Total hemoglobin: 14.3 g/dL (ref 13.5–18.0)

## 2015-10-10 LAB — FERRITIN: FERRITIN: 72 ng/mL (ref 24–336)

## 2015-10-10 LAB — RETICULOCYTES
RBC.: 5.83 MIL/uL — AB (ref 4.22–5.81)
RETIC COUNT ABSOLUTE: 81.6 10*3/uL (ref 19.0–186.0)
Retic Ct Pct: 1.4 % (ref 0.4–3.1)

## 2015-10-10 MED ORDER — PANTOPRAZOLE SODIUM 40 MG PO TBEC
40.0000 mg | DELAYED_RELEASE_TABLET | Freq: Every day | ORAL | Status: DC
  Administered 2015-10-10 – 2015-10-11 (×2): 40 mg via ORAL
  Filled 2015-10-10 (×2): qty 1

## 2015-10-10 MED ORDER — FUROSEMIDE 10 MG/ML IJ SOLN
40.0000 mg | Freq: Once | INTRAMUSCULAR | Status: AC
  Administered 2015-10-10: 40 mg via INTRAVENOUS
  Filled 2015-10-10: qty 4

## 2015-10-10 MED ORDER — SENNOSIDES-DOCUSATE SODIUM 8.6-50 MG PO TABS
1.0000 | ORAL_TABLET | Freq: Two times a day (BID) | ORAL | Status: DC
  Administered 2015-10-10 – 2015-10-11 (×3): 1 via ORAL
  Filled 2015-10-10 (×3): qty 1

## 2015-10-10 MED ORDER — FUROSEMIDE 10 MG/ML IJ SOLN
80.0000 mg | Freq: Once | INTRAMUSCULAR | Status: AC
  Administered 2015-10-10: 80 mg via INTRAVENOUS
  Filled 2015-10-10: qty 8

## 2015-10-10 NOTE — Progress Notes (Signed)
1 Day Post-Op  Subjective: Stable and alert.  Tolerating clear liquids.  No nausea.  Denies shortness of breath.  Mild incisional pain. Voiding without difficulty.  Good urine output.  Objective: Vital signs in last 24 hours: Temp:  [97.5 F (36.4 C)-98.5 F (36.9 C)] 98.5 F (36.9 C) (03/10 0416) Pulse Rate:  [61-68] 68 (03/09 2345) Resp:  [16-24] 17 (03/09 2345) BP: (102-122)/(69-89) 122/85 mmHg (03/09 2345) SpO2:  [89 %-98 %] 89 % (03/09 2345) Weight:  [110.36 kg (243 lb 4.8 oz)] 110.36 kg (243 lb 4.8 oz) (03/10 0416) Last BM Date: 10/06/15  Intake/Output from previous day: 03/09 0701 - 03/10 0700 In: 787.5 [I.V.:750; IV Piggyback:37.5] Out: 400 [Urine:350; Blood:50] Intake/Output this shift:    General appearance: Alert.  No distress.  Mental status normal. Resp: clear to auscultation bilaterally GI: Abdomen soft.  Wounds clean.  Not distended.  Appropriately tender.  Lab Results:  Results for orders placed or performed during the hospital encounter of 10/06/15 (from the past 24 hour(s))  Glucose, capillary     Status: None   Collection Time: 10/09/15  7:43 AM  Result Value Ref Range   Glucose-Capillary 93 65 - 99 mg/dL   Comment 1 Capillary Specimen   Glucose, capillary     Status: None   Collection Time: 10/09/15 11:33 AM  Result Value Ref Range   Glucose-Capillary 93 65 - 99 mg/dL   Comment 1 Capillary Specimen   Surgical PCR screen     Status: None   Collection Time: 10/09/15 12:03 PM  Result Value Ref Range   MRSA, PCR NEGATIVE NEGATIVE   Staphylococcus aureus NEGATIVE NEGATIVE  Glucose, capillary     Status: None   Collection Time: 10/09/15  3:06 PM  Result Value Ref Range   Glucose-Capillary 96 65 - 99 mg/dL  Glucose, capillary     Status: Abnormal   Collection Time: 10/09/15  5:53 PM  Result Value Ref Range   Glucose-Capillary 115 (H) 65 - 99 mg/dL   Comment 1 Notify RN   Glucose, capillary     Status: Abnormal   Collection Time: 10/09/15  7:01 PM   Result Value Ref Range   Glucose-Capillary 124 (H) 65 - 99 mg/dL   Comment 1 Capillary Specimen   CBC     Status: Abnormal   Collection Time: 10/09/15  7:27 PM  Result Value Ref Range   WBC 6.3 4.0 - 10.5 K/uL   RBC 5.54 4.22 - 5.81 MIL/uL   Hemoglobin 13.6 13.0 - 17.0 g/dL   HCT 86.5 78.4 - 69.6 %   MCV 77.4 (L) 78.0 - 100.0 fL   MCH 24.5 (L) 26.0 - 34.0 pg   MCHC 31.7 30.0 - 36.0 g/dL   RDW 29.5 (H) 28.4 - 13.2 %   Platelets 153 150 - 400 K/uL  Creatinine, serum     Status: None   Collection Time: 10/09/15  7:27 PM  Result Value Ref Range   Creatinine, Ser 1.04 0.61 - 1.24 mg/dL   GFR calc non Af Amer >60 >60 mL/min   GFR calc Af Amer >60 >60 mL/min  Glucose, capillary     Status: Abnormal   Collection Time: 10/09/15  9:55 PM  Result Value Ref Range   Glucose-Capillary 113 (H) 65 - 99 mg/dL     Studies/Results: No results found.  Marland Kitchen aspirin EC  81 mg Oral Daily  . carvedilol  12.5 mg Oral BID WC  . fondaparinux (ARIXTRA) injection  2.5 mg  Subcutaneous Q0600  . HYDROmorphone      . insulin aspart  0-20 Units Subcutaneous TID WC  . insulin aspart  0-5 Units Subcutaneous QHS  . insulin aspart  10 Units Subcutaneous BID WC  . insulin glargine  30 Units Subcutaneous QHS  . pantoprazole (PROTONIX) IV  40 mg Intravenous QHS  . piperacillin-tazobactam (ZOSYN)  IV  3.375 g Intravenous Q8H  . pregabalin  150 mg Oral QHS  . rosuvastatin  20 mg Oral Daily  . senna-docusate  1 tablet Oral BID  . sodium chloride flush  10-40 mL Intracatheter Q12H  . sodium chloride flush  3 mL Intravenous Q12H     Assessment/Plan: s/p Procedure(s): LAPAROSCOPIC CHOLECYSTECTOMY  POD#1 - laparoscopic cholecystectomy for acute cholecystitis Stable Discontinue IV antibiotics tomorrow morning Mobilize out of bed Advance diet as tolerated From a surgical standpoint he may be discharged once he is ambulatory, tolerating solid diet and stable from a cardiac standpoint    Cardiomyopathy,  congestive heart failure, hypertension, biventricular ICD, CAD LHC 10/07/2015-  1. Prox RCA to Mid RCA lesion, 20% stenosed. 2. Dist RCA lesion, 90% stenosed. 3. RPDA lesion, 90% stenosed. 4. 1st Diag lesion, 100% stenosed. 5. Mid LAD lesion, 95% stenosed. 6. Dist LAD lesion, 90% stenosed. 7. Ost Cx lesion, 60% stenosed. 8. 2nd Mrg lesion, 95% stenosed. 9. Prox Cx to Mid Cx lesion, 60% stenosed.   Diabetes mellitus without complication  @PROBHOSP @  LOS: 4 days    Benjamin [name] 10/10/2015  . .prob

## 2015-10-10 NOTE — Discharge Instructions (Signed)
[facility] ______[facility] °LAPAROSCOPIC SURGERY: POST OP INSTRUCTIONS °Always review your discharge instruction sheet given to you by the facility where your surgery was performed. °IF YOU HAVE DISABILITY OR FAMILY LEAVE FORMS, YOU MUST BRING THEM TO THE OFFICE FOR PROCESSING.   °DO NOT GIVE THEM TO YOUR DOCTOR. ° °1. A prescription for pain medication may be given to you upon discharge.  Take your pain medication as prescribed, if needed.  If narcotic pain medicine is not needed, then you may take acetaminophen (Tylenol) or ibuprofen (Advil) as needed. °2. Take your usually prescribed medications unless otherwise directed. °3. If you need a refill on your pain medication, please contact your pharmacy.  They will contact our office to request authorization. Prescriptions will not be filled after 5pm or on week-ends. °4. You should follow a light diet the first few days after arrival home, such as soup and crackers, etc.  Be sure to include lots of fluids daily. °5. Most patients will experience some swelling and bruising in the area of the incisions.  Ice packs will help.  Swelling and bruising can take several days to resolve.  °6. It is common to experience some constipation if taking pain medication after surgery.  Increasing fluid intake and taking a stool softener (such as Colace) will usually help or prevent this problem from occurring.  A mild laxative (Milk of Magnesia or Miralax) should be taken according to package instructions if there are no bowel movements after 48 hours. °7. Unless discharge instructions indicate otherwise, you may remove your bandages 24-48 hours after surgery, and you may shower at that time.  You may have steri-strips (small skin tapes) in place directly over the incision.  These strips should be left on the skin for 7-10 days.  If your surgeon used skin glue on the incision, you may shower in 24 hours.  The glue will flake off over the next 2-3 weeks.  Any sutures or  staples will be removed at the office during your follow-up visit. °8. ACTIVITIES:  You may resume regular (light) daily activities beginning the next day--such as daily self-care, walking, climbing stairs--gradually increasing activities as tolerated.  You may have sexual intercourse when it is comfortable.  Refrain from any heavy lifting or straining until approved by your doctor. °a. You may drive when you are no longer taking prescription pain medication, you can comfortably wear a seatbelt, and you can safely maneuver your car and apply brakes. °b. RETURN TO WORK:  __________________________________________________________ °9. You should see your doctor in the office for a follow-up appointment approximately 2-3 weeks after your surgery.  Make sure that you call for this appointment within a day or two after you arrive home to insure a convenient appointment time. °10. OTHER INSTRUCTIONS: __________________________________________________________________________________________________________________________ __________________________________________________________________________________________________________________________ °WHEN TO CALL YOUR DOCTOR: °1. Fever over 101.0 °2. Inability to urinate °3. Continued bleeding from incision. °4. Increased pain, redness, or drainage from the incision. °5. Increasing abdominal pain ° °The clinic staff is available to answer your questions during regular business hours.  Please don’t hesitate to call and ask to speak to one of the nurses for clinical concerns.  If you have a medical emergency, go to the nearest emergency room or call 911.  A surgeon from [facility] is always on call at the hospital. °[address], [address], [place], [place]  [address] ? [address] [address], [place], [place]   [address] °[phone] ? [phone] ? FAX [phone] °Web site:   [email or web address] °

## 2015-10-10 NOTE — Progress Notes (Signed)
[facility] PROGRESS NOTE  QUAMERE MUSSELL ZOX:[id number] DOB: [birth year] DOA: 10/06/2015 PCP: Jeanann Lewandowsky, MD  Length of stay: 4   Assessment/Plan: Principal Problem:   Cholecystitis Active Problems:   Essential hypertension   DM (diabetes mellitus) type 2, uncontrolled, with ketoacidosis (HCC)   Neuropathy (HCC)   Acute on chronic systolic heart failure (HCC)   Preoperative clearance   Preop cardiovascular exam   Coronary artery disease involving native coronary artery of native heart without angina pectoris   Brief summary 55 y.o. male with type II diabetes ,HTN, chronic systolic heart failure with ICD. Followed by Dr. Lewayne Bunting. Patient seen [facility] at [facility] today with nausea, vomiting and upper abdominal pain.   In ED, ultrasound reveals cholelithiasis and mild gallbladder wall thickening. Patient diagnosed with acute cholecystitis, status post cholecystectomy.  Patient received preop clearance by cardiology.  Assessment and plan Cholelithiasis/cholecystitis. Patient being followed by general surgery Status post laparoscopic cholecystectomy after clearance by cardiology, status post cardiac cath, required optimization of his heart failure prior to proceeding with surgery,  Advance diet as tolerated On Zosyn for acute cholecystitis, will DC prior to discharge  Risk felt to be acceptable by cardiology   Chronic systolic heart failure / left bundle branch block, s/p BiV ICD.  August 2016 ejection fraction was 20% with diffuse hypokinesis. Mild LVH. Mild MR. Left atrium severely dilated, Cath with severe 2V CAD in the LAD and RCA. Currently not having any angina No pulmonary edema on CXR. No edema. Chronic shortness of breath (unchanged). Heart failure team following, medical management per cardiology Plan was tooptimize his HF and let him proceed with lap chole , acknowledging a moderate to high peri-operative risk of CV complications Continue  Coreg, IV Lasix, Aldactone, Can likely resume po diuretics in am Once he recovers from chole plan 2-V PCI in several weeks.   Acute kidney injury Resolved and stable, Continue Lasix for diuresis,Hold benicar   Type 2 diabetes. Hgb a1c in Epic late August was 8.9. Hemoglobin A1c 7.3 -Hold home metformin and home basal insulin -Monitor CBGs, sliding scale insulin - resistant  Hold Lantus today, may give 10  units postoperatively  Hypertension. Elevated BP today.  Continue Coreg,    Hyperlipidemia. -Hold home statin for now  Neuropathy. -continue home Lyrica    DVT prophylaxsis   Heparin subcutaneous  Code Status:      Code Status Orders        Start     Ordered   10/06/15 1710  Full code   Continuous     10/06/15 1711     Family Communication: Discussed in detail with the patient, all imaging results, lab results explained to the patient   Disposition Plan: Progress depends on surgery recommendations      Consultants:  Cardiology  General surgery    Procedures:  None  Antibiotics: Anti-infectives    Start     Dose/Rate Route Frequency Ordered Stop   10/10/15 0100  piperacillin-tazobactam (ZOSYN) IVPB 3.375 g     3.375 g 12.5 mL/hr over 240 Minutes Intravenous Every 8 hours 10/09/15 1859     10/08/15 0830  piperacillin-tazobactam (ZOSYN) IVPB 3.375 g  Status:  Discontinued     3.375 g 12.5 mL/hr over 240 Minutes Intravenous 3 times per day 10/08/15 0825 10/09/15 1859   10/06/15 0930  cefTRIAXone (ROCEPHIN) 2 g in dextrose 5 % 50 mL IVPB     2 g 100 mL/hr  over 30 Minutes Intravenous  Once 10/06/15 0929 10/06/15 1015         HPI/Subjective: Denies any chest pain or shortness of breath,   Objective: Filed Vitals:   10/09/15 2345 10/09/15 2352 10/10/15 0416 10/10/15 0808  BP: 122/85     Pulse: 68     Temp:  98.3 F (36.8 C) 98.5 F (36.9 C) 98.2 F (36.8 C)  TempSrc:  Oral Oral Oral  Resp: 17     Height:      Weight:   110.36 kg  (243 lb 4.8 oz)   SpO2: 89%       Intake/Output Summary (Last 24 hours) at 10/10/15 1019 Last data filed at 10/10/15 0808  Gross per 24 hour  Intake   1950 ml  Output     50 ml  Net   1900 ml    Exam:  General: Well appearing. No resp difficulty. In bed  HEENT: normal Neck: supple. JVP 8-9 . Carotids 2+ bilat; no bruits. No lymphadenopathy or thryomegaly appreciated. Cor: PMI nondisplaced. Regular rate & rhythm. No rubs, gallops or murmurs. Lungs: clear Abdomen: soft, nontender, nondistended. No hepatosplenomegaly. No bruits or masses. Good bowel sounds. Extremities: no cyanosis, clubbing, rash, R and LLE trace-1+ edema Neuro: alert & orientedx3, cranial nerves grossly intact. moves all 4 extremities w/o difficulty. Affect pleasant  Data Review   Micro Results Recent Results (from the past 240 hour(s))  MRSA PCR Screening     Status: None   Collection Time: 10/07/15 10:10 PM  Result Value Ref Range Status   MRSA by PCR NEGATIVE NEGATIVE Final    Comment:        The GeneXpert MRSA Assay (FDA approved for NASAL specimens only), is one component of a comprehensive MRSA colonization surveillance program. It is not intended to diagnose MRSA infection nor to guide or monitor treatment for MRSA infections.   Surgical PCR screen     Status: None   Collection Time: 10/09/15 12:03 PM  Result Value Ref Range Status   MRSA, PCR NEGATIVE NEGATIVE Final   Staphylococcus aureus NEGATIVE NEGATIVE Final    Comment:        The Xpert SA Assay (FDA approved for NASAL specimens in patients over 32 years of age), is one component of a comprehensive surveillance program.  Test performance has been validated by [facility] for patients greater than or equal to 77 year old. It is not intended to diagnose infection nor to guide or monitor treatment.     Radiology Reports Dg Chest 2 View  10/06/2015  CLINICAL DATA:  Preop for cholelithiasis EXAM: CHEST  2 VIEW COMPARISON:   07/14/2015 FINDINGS: Cardiomegaly again noted. Central mild vascular congestion without convincing pulmonary edema. 3 leads cardiac pacemaker is unchanged in position. Mild basilar atelectasis. No segmental infiltrate. IMPRESSION: Cardiomegaly. Three leads cardiac pacemaker in place. Central mild vascular congestion without pulmonary edema. Mild basilar atelectasis. Electronically Signed   By: Natasha Mead M.D.   On: 10/06/2015 13:23   Dg Chest Port 1 View  10/07/2015  CLINICAL DATA:  PICC line placement. Acute on chronic systolic congestive heart failure. Coronary artery disease. Diabetes and hypertension. EXAM: PORTABLE CHEST 1 VIEW COMPARISON:  10/06/2015 FINDINGS: New right arm PICC line is seen with tip overlying expected region of superior cavoatrial junction. Low lung volumes are seen. Cardiomegaly is stable. Transvenous pacemaker remains in appropriate position. No evidence of pulmonary consolidation or edema. No evidence of pneumothorax or pleural effusion. IMPRESSION: Cardiomegaly and  low lung volumes. New right arm PICC line tip seen in expected region of superior cavoatrial junction. Electronically Signed   By: Myles Rosenthal M.D.   On: 10/07/2015 16:12   US Abdomen Limited Ruq  10/06/2015  CLINICAL DATA:  Right upper quadrant pain for 1 day EXAM: US ABDOMEN LIMITED - RIGHT UPPER QUADRANT COMPARISON:  08/10/2014 FINDINGS: Gallbladder: Stable gallstone is again identified. Mild wall thickening is noted to 4 mm. A positive sonographic Eulah Pont sign is seen. Common bile duct: Diameter: 4 mm Liver: No focal lesion identified. Within normal limits in parenchymal echogenicity. Incidental note is again made of a right renal calculus measuring 9 mm. It is nonobstructive in nature. IMPRESSION: Cholelithiasis with positive sonographic Murphy's sign and mild wall thickening. Right renal stone. Electronically Signed   By: Alcide Clever M.D.   On: 10/06/2015 08:52     CBC  Recent Labs Lab 10/06/15 0650  10/07/15 0608 10/08/15 0430 10/09/15 0435 10/09/15 1927 10/10/15 0536  WBC 8.4 13.5* 11.1* 8.4 6.3 8.3  HGB 15.0 15.5 13.9 14.3 13.6 13.7  HCT 46.4 48.0 43.0 42.7 42.9 44.0  PLT 175 185 151 156 153 151  MCV 76.1* 77.5* 76.1* 76.5* 77.4* 77.7*  MCH 24.6* 25.0* 24.6* 25.6* 24.5* 24.2*  MCHC 32.3 32.3 32.3 33.5 31.7 31.1  RDW 20.1* 18.8* 18.4* 18.0* 18.0* 17.9*  LYMPHSABS 0.8  --   --   --   --   --   MONOABS 0.5  --   --   --   --   --   EOSABS 0.3  --   --   --   --   --   BASOSABS 0.0  --   --   --   --   --     Chemistries   Recent Labs Lab 10/06/15 0650 10/06/15 1727 10/07/15 0608 10/08/15 0430 10/09/15 0435 10/09/15 1927 10/10/15 0536  NA 141  --  139 138 141  --  140  K 5.0  --  4.3 4.0 4.0  --  4.2  CL 104  --  104 102 102  --  103  CO2 25  --  23 25 24   --  25  GLUCOSE 173*  --  140* 169* 102*  --  128*  BUN 28*  --  27* 30* 21*  --  18  CREATININE 1.08  --  1.42* 1.16 1.01 1.04 1.01  CALCIUM 9.3  --  8.8* 8.2* 8.3*  --  8.5*  MG  --  1.7  --  2.0  --   --   --   AST 23  --  25 19 24   --  42*  ALT 22  --  17 15* 22  --  32  ALKPHOS 53  --  48 40 47  --  47  BILITOT 1.2  --  1.4* 0.8 1.0  --  1.4*   ------------------------------------------------------------------------------------------------------------------ estimated creatinine clearance is 100.3 mL/min (by C-G formula based on Cr of 1.01). ------------------------------------------------------------------------------------------------------------------ No results for input(s): HGBA1C in the last 72 hours. ------------------------------------------------------------------------------------------------------------------ No results for input(s): CHOL, HDL, LDLCALC, TRIG, CHOLHDL, LDLDIRECT in the last 72 hours. ------------------------------------------------------------------------------------------------------------------ No results for input(s): TSH, T4TOTAL, T3FREE, THYROIDAB in the last 72  hours.  Invalid input(s): FREET3 ------------------------------------------------------------------------------------------------------------------ No results for input(s): VITAMINB12, FOLATE, FERRITIN, TIBC, IRON, RETICCTPCT in the last 72 hours.  Coagulation profile  Recent Labs Lab 10/07/15 0608  INR 1.43    No results for input(s): DDIMER in  the last 72 hours.  Cardiac Enzymes No results for input(s): CKMB, TROPONINI, MYOGLOBIN in the last 168 hours.  Invalid input(s): CK ------------------------------------------------------------------------------------------------------------------ Invalid input(s): POCBNP   CBG:  Recent Labs Lab 10/09/15 1506 10/09/15 1753 10/09/15 1901 10/09/15 2155 10/10/15 0806  GLUCAP 96 115* 124* 113* 160*       Studies: No results found.    Lab Results  Component Value Date   HGBA1C 7.3* 10/06/2015   HGBA1C 8.0 03/27/2015   HGBA1C 7.4 07/22/2014   Lab Results  Component Value Date   LDLCALC 120 03/27/2015   CREATININE 1.01 10/10/2015       Scheduled Meds: . aspirin EC  81 mg Oral Daily  . carvedilol  12.5 mg Oral BID WC  . fondaparinux (ARIXTRA) injection  2.5 mg Subcutaneous Q0600  . furosemide  40 mg Intravenous Once  . insulin aspart  0-20 Units Subcutaneous TID WC  . insulin aspart  0-5 Units Subcutaneous QHS  . insulin aspart  10 Units Subcutaneous BID WC  . insulin glargine  30 Units Subcutaneous QHS  . pantoprazole  40 mg Oral Daily  . piperacillin-tazobactam (ZOSYN)  IV  3.375 g Intravenous Q8H  . pregabalin  150 mg Oral QHS  . rosuvastatin  20 mg Oral Daily  . senna-docusate  1 tablet Oral BID  . sodium chloride flush  10-40 mL Intracatheter Q12H  . sodium chloride flush  3 mL Intravenous Q12H   Continuous Infusions: . lactated ringers 10 mL/hr at 10/09/15 1502  . lactated ringers Stopped (10/10/15 0553)    Principal Problem:   Cholecystitis Active Problems:   Essential hypertension   DM  (diabetes mellitus) type 2, uncontrolled, with ketoacidosis (HCC)   Neuropathy (HCC)   Acute on chronic systolic heart failure (HCC)   Preoperative clearance   Preop cardiovascular exam   Coronary artery disease involving native coronary artery of native heart without angina pectoris    Time spent: 45 minutes   [facility]  [facility] Pager [phone]. If 7PM-7AM, please contact night-coverage at [email or web address], password [facility] 10/10/2015, 10:19 AM  LOS: 4 days

## 2015-10-10 NOTE — Progress Notes (Signed)
Advanced Heart Failure Rounding Note   Subjective:    S/p lap chole 10/09/15.  Denies SOB. Belly slightly tender after lap chole.  Co-ox marginal at 56.3%. CVP 11-12 on my check this am.    Objective:   Weight Range:  Vital Signs:   Temp:  [97.5 F (36.4 C)-98.5 F (36.9 C)] 98.2 F (36.8 C) (03/10 0808) Pulse Rate:  [61-68] 68 (03/09 2345) Resp:  [16-24] 17 (03/09 2345) BP: (102-122)/(69-89) 122/85 mmHg (03/09 2345) SpO2:  [89 %-98 %] 89 % (03/09 2345) Weight:  [243 lb 4.8 oz (110.36 kg)] 243 lb 4.8 oz (110.36 kg) (03/10 0416) Last BM Date: 10/06/15  Weight change: Filed Weights   10/08/15 0209 10/09/15 0355 10/10/15 0416  Weight: 248 lb 3.8 oz (112.6 kg) 244 lb 6.4 oz (110.859 kg) 243 lb 4.8 oz (110.36 kg)    Intake/Output:   Intake/Output Summary (Last 24 hours) at 10/10/15 0831 Last data filed at 10/10/15 0808  Gross per 24 hour  Intake   1975 ml  Output    400 ml  Net   1575 ml     Physical Exam: CVP 11-12 General:  Well appearing. No resp difficulty. Lying in bed HEENT: normal Neck: supple. JVP jaw Carotids 2+ bilat; no bruits. No thyromegaly or nodule noted.  Cor: PMI nondisplaced. RRR. No M/G/R Lungs: CTAB, normal effort Abdomen: soft, mildly tender, ND, no HSM. No bruits or masses. +BS. Lap chole incisions C/D/I.  Extremities: no cyanosis, clubbing, rash, R and LLE no edema.  Neuro: alert & orientedx3, cranial nerves grossly intact. moves all 4 extremities w/o difficulty. Affect pleasant  Telemetry: Reviewed personally,  NSR 70s   Labs: Basic Metabolic Panel:  Recent Labs Lab 10/06/15 0650 10/06/15 1727 10/07/15 0608 10/08/15 0430 10/09/15 0435 10/09/15 1927 10/10/15 0536  NA 141  --  139 138 141  --  140  K 5.0  --  4.3 4.0 4.0  --  4.2  CL 104  --  104 102 102  --  103  CO2 25  --  23 25 24   --  25  GLUCOSE 173*  --  140* 169* 102*  --  128*  BUN 28*  --  27* 30* 21*  --  18  CREATININE 1.08  --  1.42* 1.16 1.01 1.04 1.01   CALCIUM 9.3  --  8.8* 8.2* 8.3*  --  8.5*  MG  --  1.7  --  2.0  --   --   --   PHOS  --  4.7*  --   --   --   --   --     Liver Function Tests:  Recent Labs Lab 10/06/15 0650 10/07/15 0608 10/08/15 0430 10/09/15 0435 10/10/15 0536  AST 23 25 19 24  42*  ALT 22 17 15* 22 32  ALKPHOS 53 48 40 47 47  BILITOT 1.2 1.4* 0.8 1.0 1.4*  PROT 6.8 6.3* 5.4* 5.9* 5.6*  ALBUMIN 4.0 3.3* 2.8* 2.8* 2.8*    Recent Labs Lab 10/06/15 0650 10/07/15 0608  LIPASE 22 20   No results for input(s): AMMONIA in the last 168 hours.  CBC:  Recent Labs Lab 10/06/15 0650 10/07/15 0608 10/08/15 0430 10/09/15 0435 10/09/15 1927 10/10/15 0536  WBC 8.4 13.5* 11.1* 8.4 6.3 8.3  NEUTROABS 6.8  --   --   --   --   --   HGB 15.0 15.5 13.9 14.3 13.6 13.7  HCT 46.4 48.0 43.0 42.7  42.9 44.0  MCV 76.1* 77.5* 76.1* 76.5* 77.4* 77.7*  PLT 175 185 151 156 153 151    Cardiac Enzymes: No results for input(s): CKTOTAL, CKMB, CKMBINDEX, TROPONINI in the last 168 hours.  BNP: BNP (last 3 results)  Recent Labs  10/06/15 1939  BNP 1374.4*    ProBNP (last 3 results) No results for input(s): PROBNP in the last 8760 hours.    Other results:  Imaging: No results found.   Medications:     Scheduled Medications: . aspirin EC  81 mg Oral Daily  . carvedilol  12.5 mg Oral BID WC  . fondaparinux (ARIXTRA) injection  2.5 mg Subcutaneous Q0600  . insulin aspart  0-20 Units Subcutaneous TID WC  . insulin aspart  0-5 Units Subcutaneous QHS  . insulin aspart  10 Units Subcutaneous BID WC  . insulin glargine  30 Units Subcutaneous QHS  . pantoprazole (PROTONIX) IV  40 mg Intravenous QHS  . piperacillin-tazobactam (ZOSYN)  IV  3.375 g Intravenous Q8H  . pregabalin  150 mg Oral QHS  . rosuvastatin  20 mg Oral Daily  . senna-docusate  1 tablet Oral BID  . sodium chloride flush  10-40 mL Intracatheter Q12H  . sodium chloride flush  3 mL Intravenous Q12H    Infusions: . lactated ringers 10  mL/hr at 10/09/15 1502  . lactated ringers Stopped (10/10/15 0553)    PRN Medications: [DISCONTINUED] acetaminophen **OR** acetaminophen, acetaminophen, HYDROmorphone (DILAUDID) injection, morphine injection, ondansetron **OR** ondansetron (ZOFRAN) IV, oxyCODONE-acetaminophen, sodium chloride flush   Assessment:   1. Acute cholecystitis 2.CAD- Multivessel CAD 3. A/C Systolic Heart Failure 4. HTN 5. Hyperlipidemia   Plan/Discussion:    Stable. Co-ox marginal but stable this morning. Continue current regimen. Had LR running overnight.  I/Os may be inaccurate. Will stop to prevent overload. Advancing diet as tolerated.   Mildly volume overloaded by CVP after surgery. Will give IV lasix 40 mg once as to not overshoot. Can likely resume po diuretics in am.   Once he recovers from chole plan 2-V PCI in several weeks.   Length of Stay: 4  Graciella Freer PA-C 10/10/2015, 8:31 AM   Advanced Heart Failure Team Pager [phone] (M-F; 7a - 4p)  Please contact [facility] for night-coverage after hours (4p -7a ) and weekends on [email or web address]   Patient seen and examined with Otilio Saber, PA-C. We discussed all aspects of the encounter. I agree with the assessment and plan as stated above.   He has tolerated lap chole well. Volume status up with CVP 12-14. Will give another dose of 80 IV lasix this afternoon. Co-ox ok. Will add losartan  daily. Possibly home in am if pain well controlled and tolerating diet.   Bensimhon, Daniel,MD 4:18 PM

## 2015-10-10 NOTE — Care Management Note (Signed)
Case Management Note  Patient Details  Name: Benjamin [name] MRN: [id number] Date of Birth: Jan 25, 1961  Subjective/Objective:        Patient agreeable to TCC with [facility], an appointment has been set up for him on March 16th  At 11:45am with Dr. Venetia Night, and is on the Discharge AVS, At that time he express a lot of question about   Qualifying for Medicaid.   Talked with Collie Siad, [facility] and Eligibility Specialist, about patient orange card and medicaid eligibility.  Patient will be linked with [facility] CM Post discharge.  They will be contacting him on Monday.  Action/Plan:   Expected Discharge Date:                  Expected Discharge Plan:  Home/Self Care  In-House Referral:     Discharge planning Services  CM Consult  Post Acute Care Choice:    Choice offered to:     DME Arranged:    DME Agency:     HH Arranged:    HH Agency:     Status of Service:  In process, will continue to follow  Medicare Important Message Given:    Date Medicare IM Given:    Medicare IM give by:    Date Additional Medicare IM Given:    Additional Medicare Important Message give by:     If discussed at Long Length of Stay Meetings, dates discussed:    Additional Comments:  Vangie Bicker, RN 10/10/2015, 11:31 AM

## 2015-10-10 NOTE — Hospital Discharge Follow-Up (Signed)
Transitional Care Clinic Care Coordination Note:  Admit date:  10/06/15 Discharge date: TBD Discharge Disposition: Home when stable Patient contact: [phone] (mobile) or [phone] Emergency contact(s):  This Case Manager reviewed patient's EMR and determined patient would benefit from post-discharge medical management and chronic care management services through the [facility]. Patient has a history of type II diabetes, HTN, CAD, chronic systolic heart failure with ICD. Admitted with cholelithiasis/cholecystitis. This Case Manager met with patient to discuss the services and medical management that can be provided at the [facility]. Patient verbalized understanding and agreed to receive post-discharge care at the [facility].   Patient scheduled for Transitional Care appointment on 10/16/15 at 1145 with Dr. Jarold Song.  Clinic information and appointment time provided to patient. Appointment information also placed on AVS.  Assessment:       Home Environment: Patient lives with his family in a private residence.       Support System: Family members       Level of functioning: Patient is primarily independent with daily activities though he does indicate he has shortness of breath that sometimes limits his physical activity.       Home DME: none. Patient indicates he does have BP machine that he uses PRN as well as glucometer and diabetes monitoring supplies.       Home care services: none       Transportation: Patient indicates he typically drives to his medical appointments.         Food/Nutrition: Patient indicates his spouse shops for and prepares meals. Patient indicates he has access to needed food.         Medications:Patient indicates he typically uses Scient[facility], research (physical sciences) and [facility] or [facility] Department for his medications. Patient indicates he can typically get his medications without any problem.        Identified  Barriers:uninsured though does have [employer]. Patient indicates he has applied for Medicaid and Disability and application still pending. He indicates he has an appointment on 10/27/15 for consultative exam. Patient interested in speaking with a Development worker, community while inpatient. Luz Lex, RN CM updated.        PCP: Dr. Genevive [facility].            Arranged services:        Services communicated to Luz Lex, RN CM

## 2015-10-11 ENCOUNTER — Encounter (HOSPITAL_COMMUNITY): Payer: Self-pay | Admitting: Interventional Cardiology

## 2015-10-11 DIAGNOSIS — K819 Cholecystitis, unspecified: Secondary | ICD-10-CM | POA: Diagnosis not present

## 2015-10-11 DIAGNOSIS — I5023 Acute on chronic systolic (congestive) heart failure: Secondary | ICD-10-CM | POA: Diagnosis not present

## 2015-10-11 DIAGNOSIS — I10 Essential (primary) hypertension: Secondary | ICD-10-CM | POA: Diagnosis not present

## 2015-10-11 DIAGNOSIS — E1310 Other specified diabetes mellitus with ketoacidosis without coma: Secondary | ICD-10-CM | POA: Diagnosis not present

## 2015-10-11 DIAGNOSIS — G629 Polyneuropathy, unspecified: Secondary | ICD-10-CM

## 2015-10-11 LAB — COMPREHENSIVE METABOLIC PANEL
ALK PHOS: 50 U/L (ref 38–126)
ALT: 29 U/L (ref 17–63)
ANION GAP: 13 (ref 5–15)
AST: 36 U/L (ref 15–41)
Albumin: 2.8 g/dL — ABNORMAL LOW (ref 3.5–5.0)
BILIRUBIN TOTAL: 1.5 mg/dL — AB (ref 0.3–1.2)
BUN: 19 mg/dL (ref 6–20)
CALCIUM: 8.4 mg/dL — AB (ref 8.9–10.3)
CO2: 23 mmol/L (ref 22–32)
Chloride: 100 mmol/L — ABNORMAL LOW (ref 101–111)
Creatinine, Ser: 1.17 mg/dL (ref 0.61–1.24)
GLUCOSE: 155 mg/dL — AB (ref 65–99)
POTASSIUM: 3.9 mmol/L (ref 3.5–5.1)
Sodium: 136 mmol/L (ref 135–145)
TOTAL PROTEIN: 6 g/dL — AB (ref 6.5–8.1)

## 2015-10-11 LAB — GLUCOSE, CAPILLARY
GLUCOSE-CAPILLARY: 147 mg/dL — AB (ref 65–99)
GLUCOSE-CAPILLARY: 139 mg/dL — AB (ref 65–99)

## 2015-10-11 LAB — CBC
HEMATOCRIT: 44 % (ref 39.0–52.0)
HEMOGLOBIN: 14.8 g/dL (ref 13.0–17.0)
MCH: 25.5 pg — AB (ref 26.0–34.0)
MCHC: 33.6 g/dL (ref 30.0–36.0)
MCV: 75.7 fL — AB (ref 78.0–100.0)
Platelets: 173 10*3/uL (ref 150–400)
RBC: 5.81 MIL/uL (ref 4.22–5.81)
RDW: 18.1 % — AB (ref 11.5–15.5)
WBC: 10.3 10*3/uL (ref 4.0–10.5)

## 2015-10-11 LAB — CARBOXYHEMOGLOBIN
Carboxyhemoglobin: 2.2 % — ABNORMAL HIGH (ref 0.5–1.5)
METHEMOGLOBIN: 0.5 % (ref 0.0–1.5)
O2 Saturation: 94.5 %
TOTAL HEMOGLOBIN: 14.5 g/dL (ref 13.5–18.0)

## 2015-10-11 MED ORDER — OXYCODONE-ACETAMINOPHEN 5-325 MG PO TABS: 1.0000 | ORAL_TABLET | Freq: Four times a day (QID) | ORAL | Status: DC | PRN

## 2015-10-11 MED ORDER — OLMESARTAN MEDOXOMIL 20 MG PO TABS: 10.0000 mg | ORAL_TABLET | Freq: Every day | ORAL | Status: DC

## 2015-10-11 MED ORDER — PANTOPRAZOLE SODIUM 40 MG PO TBEC: 40.0000 mg | DELAYED_RELEASE_TABLET | Freq: Every day | ORAL | Status: DC

## 2015-10-11 MED ORDER — FUROSEMIDE 20 MG PO TABS: 40.0000 mg | ORAL_TABLET | Freq: Every day | ORAL | Status: DC

## 2015-10-11 MED ORDER — CARVEDILOL 12.5 MG PO TABS: 12.5000 mg | ORAL_TABLET | Freq: Two times a day (BID) | ORAL | Status: DC

## 2015-10-11 MED ORDER — DIGOXIN 125 MCG PO TABS: 0.1250 mg | ORAL_TABLET | Freq: Every day | ORAL | Status: DC

## 2015-10-11 MED ORDER — SPIRONOLACTONE 25 MG PO TABS: 12.5000 mg | ORAL_TABLET | Freq: Every day | ORAL | Status: DC

## 2015-10-11 MED ORDER — ALTEPLASE 2 MG IJ SOLR
2.0000 mg | Freq: Once | INTRAMUSCULAR | Status: AC
  Administered 2015-10-11: 2 mg
  Filled 2015-10-11: qty 2

## 2015-10-11 NOTE — Discharge Summary (Signed)
PATIENT DETAILS Name: Benjamin [name] Age: 55 y.o. Sex: male Date of Birth: [birth year] MRN: [id number]. Admitting Physician: Ozella Rocks, MD YNW:[id number], Keane Scrape, MD  Admit Date: 10/06/2015 Discharge date: 10/11/2015  Recommendations for Outpatient Follow-up:  1. Please ensure follow-up with cardiology-plans are for PCI in a few weeks once patient recovers from cholecystectomy.  2. Please ensure follow-up with general surgery. 3. Please repeat CBC/BMET at next visit  PRIMARY DISCHARGE DIAGNOSIS:  Principal Problem:   Cholecystitis Active Problems:   Essential hypertension   DM (diabetes mellitus) type 2, uncontrolled, with ketoacidosis (HCC)   Neuropathy (HCC)   Acute on chronic systolic heart failure (HCC)   Preoperative clearance   Preop cardiovascular exam   Coronary artery disease involving native coronary artery of native heart without angina pectoris      PAST MEDICAL HISTORY: Past Medical History  Diagnosis Date  . Hypertension   . High cholesterol   . Cholelithiasis   . Chronic systolic heart failure (HCC)   . AICD (automatic cardioverter/defibrillator) present   . Kidney stone   . Coronary artery disease   . Pneumonia 2002  . Type II diabetes mellitus (HCC)   . GERD (gastroesophageal reflux disease)     DISCHARGE MEDICATIONS: Current Discharge Medication List    START taking these medications   Details  digoxin (LANOXIN) 0.125 MG tablet Take 1 tablet (0.125 mg total) by mouth daily. Qty: 30 tablet, Refills: 0    oxyCODONE-acetaminophen (PERCOCET/ROXICET) 5-325 MG tablet Take 1-2 tablets by mouth every 6 (six) hours as needed for moderate pain. Qty: 30 tablet, Refills: 0    pantoprazole (PROTONIX) 40 MG tablet Take 1 tablet (40 mg total) by mouth daily. Qty: 30 tablet, Refills: 0    spironolactone (ALDACTONE) 25 MG tablet Take 0.5 tablets (12.5 mg total) by mouth daily. Qty: 30 tablet, Refills: 0      CONTINUE these medications which have  CHANGED   Details  carvedilol (COREG) 12.5 MG tablet Take 1 tablet (12.5 mg total) by mouth 2 (two) times daily with a meal. Qty: 60 tablet, Refills: 0    furosemide (LASIX) 20 MG tablet Take 2 tablets (40 mg total) by mouth daily. Qty: 90 tablet, Refills: 0   Associated Diagnoses: Chronic systolic heart failure (HCC)    olmesartan (BENICAR) 20 MG tablet Take 0.5 tablets (10 mg total) by mouth daily. Qty: 30 tablet, Refills: 0      CONTINUE these medications which have NOT CHANGED   Details  aspirin EC 81 MG tablet Take 81 mg by mouth daily.    insulin aspart (NOVOLOG FLEXPEN) 100 UNIT/ML FlexPen Inject 10 Units into the skin 2 (two) times daily with a meal.    insulin glargine (LANTUS) 100 UNIT/ML injection Inject 0.3 mLs (30 Units total) into the skin at bedtime. Qty: 3 vial, Refills: 3   Associated Diagnoses: DM (diabetes mellitus) type 2, uncontrolled, with ketoacidosis (HCC)    metFORMIN (GLUCOPHAGE) 1000 MG tablet Take 1 tablet (1,000 mg total) by mouth 2 (two) times daily with a meal. Qty: 180 tablet, Refills: 3    pregabalin (LYRICA) 75 MG capsule Take 150 mg by mouth at bedtime.    rosuvastatin (CRESTOR) 20 MG tablet Take 1 tablet (20 mg total) by mouth daily. Qty: 90 tablet, Refills: 3    benzonatate (TESSALON) 100 MG capsule Take 1 capsule (100 mg total) by mouth every 8 (eight) hours. Qty: 21 capsule, Refills: 0        ALLERGIES:  Allergies  Allergen Reactions  . Heparin Other (See Comments)    Will not use pork-derived products due to religious beliefs   . Lovenox [Enoxaparin Sodium] Other (See Comments)    Will not use pork-derived products due to religious beliefs   . Pork-Derived Products     Does not eat pork or pork derived products d/t religious reasons    BRIEF HPI:  See H&P, Labs, Consult and Test reports for all details in brief,55 y.o. male with type II diabetes ,HTN, chronic systolic heart failure with ICD who presented with abdominal pain  and vomiting, was found to have cholecystitis.  CONSULTATIONS:   cardiology and general surgery  PERTINENT RADIOLOGIC STUDIES: Dg Chest 2 View  10/06/2015  CLINICAL DATA:  Preop for cholelithiasis EXAM: CHEST  2 VIEW COMPARISON:  07/14/2015 FINDINGS: Cardiomegaly again noted. Central mild vascular congestion without convincing pulmonary edema. 3 leads cardiac pacemaker is unchanged in position. Mild basilar atelectasis. No segmental infiltrate. IMPRESSION: Cardiomegaly. Three leads cardiac pacemaker in place. Central mild vascular congestion without pulmonary edema. Mild basilar atelectasis. Electronically Signed   By: Natasha Mead M.D.   On: 10/06/2015 13:23   Dg Chest Port 1 View  10/07/2015  CLINICAL DATA:  PICC line placement. Acute on chronic systolic congestive heart failure. Coronary artery disease. Diabetes and hypertension. EXAM: PORTABLE CHEST 1 VIEW COMPARISON:  10/06/2015 FINDINGS: New right arm PICC line is seen with tip overlying expected region of superior cavoatrial junction. Low lung volumes are seen. Cardiomegaly is stable. Transvenous pacemaker remains in appropriate position. No evidence of pulmonary consolidation or edema. No evidence of pneumothorax or pleural effusion. IMPRESSION: Cardiomegaly and low lung volumes. New right arm PICC line tip seen in expected region of superior cavoatrial junction. Electronically Signed   By: Myles Rosenthal M.D.   On: 10/07/2015 16:12   US Abdomen Limited Ruq  10/06/2015  CLINICAL DATA:  Right upper quadrant pain for 1 day EXAM: US ABDOMEN LIMITED - RIGHT UPPER QUADRANT COMPARISON:  08/10/2014 FINDINGS: Gallbladder: Stable gallstone is again identified. Mild wall thickening is noted to 4 mm. A positive sonographic Eulah Pont sign is seen. Common bile duct: Diameter: 4 mm Liver: No focal lesion identified. Within normal limits in parenchymal echogenicity. Incidental note is again made of a right renal calculus measuring 9 mm. It is nonobstructive in nature.  IMPRESSION: Cholelithiasis with positive sonographic Murphy's sign and mild wall thickening. Right renal stone. Electronically Signed   By: Alcide Clever M.D.   On: 10/06/2015 08:52     PERTINENT LAB RESULTS: CBC:  Recent Labs  10/10/15 1109 10/11/15 0914  WBC 9.1 10.3  HGB 14.3 14.8  HCT 45.2 44.0  PLT 158 173   CMET CMP     Component Value Date/Time   NA 136 10/11/2015 0914   K 3.9 10/11/2015 0914   CL 100* 10/11/2015 0914   CO2 23 10/11/2015 0914   GLUCOSE 155* 10/11/2015 0914   BUN 19 10/11/2015 0914   CREATININE 1.17 10/11/2015 0914   CREATININE 1.01 09/26/2015 1145   CALCIUM 8.4* 10/11/2015 0914   PROT 6.0* 10/11/2015 0914   ALBUMIN 2.8* 10/11/2015 0914   AST 36 10/11/2015 0914   ALT 29 10/11/2015 0914   ALKPHOS 50 10/11/2015 0914   BILITOT 1.5* 10/11/2015 0914   GFRNONAA >60 10/11/2015 0914   GFRNONAA 83 10/29/2013 0909   GFRAA >60 10/11/2015 0914   GFRAA >89 10/29/2013 0909    GFR Estimated Creatinine Clearance: 86.6 mL/min (by C-G formula based  on Cr of 1.17). No results for input(s): LIPASE, AMYLASE in the last 72 hours. No results for input(s): CKTOTAL, CKMB, CKMBINDEX, TROPONINI in the last 72 hours. Invalid input(s): POCBNP No results for input(s): DDIMER in the last 72 hours. No results for input(s): HGBA1C in the last 72 hours. No results for input(s): CHOL, HDL, LDLCALC, TRIG, CHOLHDL, LDLDIRECT in the last 72 hours. No results for input(s): TSH, T4TOTAL, T3FREE, THYROIDAB in the last 72 hours.  Invalid input(s): FREET3  Recent Labs  10/10/15 1109  VITAMINB12 363  FOLATE 16.7  FERRITIN 72  TIBC 304  IRON 17*  RETICCTPCT 1.4   Coags: No results for input(s): INR in the last 72 hours.  Invalid input(s): PT Microbiology: Recent Results (from the past 240 hour(s))  MRSA PCR Screening     Status: None   Collection Time: 10/07/15 10:10 PM  Result Value Ref Range Status   MRSA by PCR NEGATIVE NEGATIVE Final    Comment:        The  GeneXpert MRSA Assay (FDA approved for NASAL specimens only), is one component of a comprehensive MRSA colonization surveillance program. It is not intended to diagnose MRSA infection nor to guide or monitor treatment for MRSA infections.   Surgical PCR screen     Status: None   Collection Time: 10/09/15 12:03 PM  Result Value Ref Range Status   MRSA, PCR NEGATIVE NEGATIVE Final   Staphylococcus aureus NEGATIVE NEGATIVE Final    Comment:        The Xpert SA Assay (FDA approved for NASAL specimens in patients over 37 years of age), is one component of a comprehensive surveillance program.  Test performance has been validated by [facility] for patients greater than or equal to 54 year old. It is not intended to diagnose infection nor to guide or monitor treatment.      BRIEF HOSPITAL COURSE:   Principal Problem: Acute Cholecystitis: Was seen by general surgery, preoperative cardiology consultation was also obtained. Patient underwent a left heart catheterization, and further optimization by cardiology prior to proceeding with laparoscopic cholecystectomy. Thankfully tolerated the procedure pretty well, diet has now been advanced. He was empirically maintained on antibiotics throughout this hospital stay. No further recommendations from general surgery, okay to discharge, does not require any more antibiotics as gallbladder has been removed. Ensure outpatient follow-up with general surgery.  Active Problems: Acute on chronic systolic heart failure: Followed closely by CHF team throughout this hospital stay, was treated with intravenous diuretics. By day of discharge compensated. Okay from cardiology to discharge today. Cardiology has recommended the above noted medications. Cardiology will arrange for outpatient follow-up with them in the clinic.  Coronary artery disease: Cardiac catheterization prior to proceeding with cholecystectomy demonstrated multivessel disease, thankfully  tolerated laparoscopic cholecystectomy well. Cardiology plans on proceeding with PCI once patient has fully recovered from cholecystectomy. In the meantime, we'll continue with aspirin, statin and a beta blocker.  Acute kidney injury: Likely secondary to acute cholecystitis, resolved by day of discharge.  Type 2 diabetes: Resume home regimen of insulin and oral hypoglycemics.  Hypertension: Controlled, continue Coreg, Aldactone, Benicar and Lasix. Follow and optimize in the outpatient setting.  Dyslipidemia: Continue statin on discharge.  Neuropathy: Continue Lyrica.  TODAY-DAY OF DISCHARGE:  Subjective:   Benjamin [name] today has no headache,no chest abdominal pain,no new weakness tingling or numbness, feels much better wants to go home today.   Objective:   Blood pressure 102/68, pulse 71, temperature 98.3 F (36.8 C),  temperature source Oral, resp. rate 17, height  (1.702 m), weight 110.496 kg (243 lb 9.6 oz), SpO2 96 %.  Intake/Output Summary (Last 24 hours) at 10/11/15 1128 Last data filed at 10/11/15 0832  Gross per 24 hour  Intake    390 ml  Output   1100 ml  Net   -710 ml   Filed Weights   10/09/15 0355 10/10/15 0416 10/11/15 0500  Weight: 110.859 kg (244 lb 6.4 oz) 110.36 kg (243 lb 4.8 oz) 110.496 kg (243 lb 9.6 oz)    Exam Awake Alert, Oriented *3, No new F.N deficits, Normal affect [place].AT,PERRAL Supple Neck,No JVD, No cervical lymphadenopathy appriciated.  Symmetrical Chest wall movement, Good air movement bilaterally, CTAB RRR,No Gallops,Rubs or new Murmurs, No Parasternal Heave +ve B.Sounds, Abd Soft, Non tender, No organomegaly appriciated, No rebound -guarding or rigidity. No Cyanosis, Clubbing or edema, No new Rash or bruise  DISCHARGE CONDITION: Stable  DISPOSITION: Home  DISCHARGE INSTRUCTIONS:    Activity:  As tolerated  Get Medicines reviewed and adjusted: Please take all your medications with you for your next visit with your Primary  MD  Please request your Primary MD to go over all hospital tests and procedure/radiological results at the follow up, please ask your Primary MD to get all Hospital records sent to his/her office.  If you experience worsening of your admission symptoms, develop shortness of breath, life threatening emergency, suicidal or homicidal thoughts you must seek medical attention immediately by calling 911 or calling your MD immediately  if symptoms less severe.  You must read complete instructions/literature along with all the possible adverse reactions/side effects for all the Medicines you take and that have been prescribed to you. Take any new Medicines after you have completely understood and accpet all the possible adverse reactions/side effects.   Do not drive when taking Pain medications.   Do not take more than prescribed Pain, Sleep and Anxiety Medications  Special Instructions: If you have smoked or chewed Tobacco  in the last 2 yrs please stop smoking, stop any regular Alcohol  and or any Recreational drug use.  Wear Seat belts while driving.  Please note  You were cared for by a hospitalist during your hospital stay. Once you are discharged, your primary care physician will handle any further medical issues. Please note that NO REFILLS for any discharge medications will be authorized once you are discharged, as it is imperative that you return to your primary care physician (or establish a relationship with a primary care physician if you do not have one) for your aftercare needs so that they can reassess your need for medications and monitor your lab values.   Diet recommendation: Diabetic Diet Heart Healthy diet  Discharge Instructions    Call MD for:  difficulty breathing, headache or visual disturbances    Complete by:  As directed      Call MD for:  redness, tenderness, or signs of infection (pain, swelling, redness, odor or green/yellow discharge around incision site)     Complete by:  As directed      Call MD for:  severe uncontrolled pain    Complete by:  As directed      Diet - low sodium heart healthy    Complete by:  As directed      Increase activity slowly    Complete by:  As directed            Follow-up Information    Follow up with  Ernestene Mention, MD. Schedule an appointment as soon as possible for a visit in 3 weeks.   Specialty:  General Surgery   Contact information:   [address] [place] [address] [phone]       Follow up with [facility] On 10/16/2015.   Why:  Transitional Care Clinic appointment on 10/16/15 at 11:45 am with Dr. Venetia Night.   Contact information:   [address] [place] [address] [phone]      Follow up with Arvilla Meres, MD.   Specialty:  Cardiology   Why:  Office will call with date/time   Contact information:   [address] [place] [address] [phone]       Total Time spent on discharge equals 45 minutes.  SignedJeoffrey Massed 10/11/2015 11:28 AM

## 2015-10-11 NOTE — Progress Notes (Signed)
[facility] Progress Note 2 Days Post-Op  Subjective: Patient doing well.  Complaining of dry mouth.    Objective: Vital signs in last 24 hours: Temp:  [98.2 F (36.8 C)-100.7 F (38.2 C)] 98.3 F (36.8 C) (03/11 0759) Pulse Rate:  [71-83] 71 (03/11 0759) Resp:  [16-30] 17 (03/11 0759) BP: (99-108)/(68-73) 102/68 mmHg (03/11 0759) SpO2:  [91 %-97 %] 96 % (03/11 0759) Weight:  [110.496 kg (243 lb 9.6 oz)] 110.496 kg (243 lb 9.6 oz) (03/11 0500) Last BM Date: 10/06/15  Intake/Output from previous day: 03/10 0701 - 03/11 0700 In: 580 [P.O.:480; IV Piggyback:100] Out: 1550 [Urine:1550] Intake/Output this shift:    General: No acute distress.  Lungs: Clear to auscultation  Abd: Soft, minimally tender.  Good bowel sounds.  Extremities: No clinical signs or symptoms of DVT  Neuro: Intact  Lab Results:  @LABLAST2 (wbc:2,hgb:2,hct:2,plt:2) BMET ) Recent Labs  10/10/15 0536 10/10/15 1109  NA 140 135  K 4.2 4.6  CL 103 103  CO2 25 23  GLUCOSE 128* 191*  BUN 18 17  CREATININE 1.01 1.00  CALCIUM 8.5* 8.3*   PT/INR No results for input(s): LABPROT, INR in the last 72 hours. ABG No results for input(s): PHART, HCO3 in the last 72 hours.  Invalid input(s): PCO2, PO2  Studies/Results: No results found.  Anti-infectives: Anti-infectives    Start     Dose/Rate Route Frequency Ordered Stop   10/10/15 0100  piperacillin-tazobactam (ZOSYN) IVPB 3.375 g     3.375 g 12.5 mL/hr over 240 Minutes Intravenous Every 8 hours 10/09/15 1859     10/08/15 0830  piperacillin-tazobactam (ZOSYN) IVPB 3.375 g  Status:  Discontinued     3.375 g 12.5 mL/hr over 240 Minutes Intravenous 3 times per day 10/08/15 0825 10/09/15 1859   10/06/15 0930  cefTRIAXone (ROCEPHIN) 2 g in dextrose 5 % 50 mL IVPB     2 g 100 mL/hr over 30 Minutes Intravenous  Once 10/06/15 0929 10/06/15 1015      Assessment/Plan: s/p Procedure(s): LAPAROSCOPIC CHOLECYSTECTOMY Recovering well from surgical  standpoint.  Can go home at anytime. Follow up in [facility] clinic in two weeks.  LOS: 5 days   Marta Lamas. Gae Bon, MD, FACS [phone] [facility] 10/11/2015

## 2015-10-11 NOTE — Progress Notes (Signed)
Advanced Heart Failure Rounding Note   Subjective:    Feels good. Eating well. No ab pain. C/o dry mouth. No SOB. PICC clotted.   Objective:   Weight Range:  Vital Signs:   Temp:  [98.3 F (36.8 C)-100.7 F (38.2 C)] 98.3 F (36.8 C) (03/11 0759) Pulse Rate:  [71-83] 71 (03/11 0759) Resp:  [16-30] 17 (03/11 0759) BP: (99-108)/(68-73) 102/68 mmHg (03/11 0759) SpO2:  [91 %-97 %] 96 % (03/11 0759) Weight:  [110.496 kg (243 lb 9.6 oz)] 110.496 kg (243 lb 9.6 oz) (03/11 0500) Last BM Date: 10/06/15  Weight change: Filed Weights   10/09/15 0355 10/10/15 0416 10/11/15 0500  Weight: 110.859 kg (244 lb 6.4 oz) 110.36 kg (243 lb 4.8 oz) 110.496 kg (243 lb 9.6 oz)    Intake/Output:   Intake/Output Summary (Last 24 hours) at 10/11/15 1039 Last data filed at 10/11/15 0832  Gross per 24 hour  Intake    390 ml  Output   1100 ml  Net   -710 ml     Physical Exam:  General:  Well appearing. No resp difficulty. Lying in bed HEENT: normal Neck: supple. JVP 7Carotids 2+ bilat; no bruits. No thyromegaly or nodule noted.  Cor: PMI nondisplaced. RRR. No M/G/R Lungs: CTAB, normal effort Abdomen: soft, mildly tender, ND, no HSM. No bruits or masses. +BS. Lap chole incisions C/D/I.  Extremities: no cyanosis, clubbing, rash, R and LLE no edema.  Neuro: alert & orientedx3, cranial nerves grossly intact. moves all 4 extremities w/o difficulty. Affect pleasant  Telemetry: Reviewed personally,  NSR 70s   Labs: Basic Metabolic Panel:  Recent Labs Lab 10/06/15 1727 10/07/15 0608 10/08/15 0430 10/09/15 0435 10/09/15 1927 10/10/15 0536 10/10/15 1109  NA  --  139 138 141  --  140 135  K  --  4.3 4.0 4.0  --  4.2 4.6  CL  --  104 102 102  --  103 103  CO2  --  23 25 24   --  25 23  GLUCOSE  --  140* 169* 102*  --  128* 191*  BUN  --  27* 30* 21*  --  18 17  CREATININE  --  1.42* 1.16 1.01 1.04 1.01 1.00  CALCIUM  --  8.8* 8.2* 8.3*  --  8.5* 8.3*  MG 1.7  --  2.0  --   --   --    --   PHOS 4.7*  --   --   --   --   --   --     Liver Function Tests:  Recent Labs Lab 10/07/15 0608 10/08/15 0430 10/09/15 0435 10/10/15 0536 10/10/15 1109  AST 25 19 24  42* 44*  ALT 17 15* 22 32 32  ALKPHOS 48 40 47 47 49  BILITOT 1.4* 0.8 1.0 1.4* 1.3*  PROT 6.3* 5.4* 5.9* 5.6* 5.6*  ALBUMIN 3.3* 2.8* 2.8* 2.8* 2.8*    Recent Labs Lab 10/06/15 0650 10/07/15 0608  LIPASE 22 20   No results for input(s): AMMONIA in the last 168 hours.  CBC:  Recent Labs Lab 10/06/15 0650  10/08/15 0430 10/09/15 0435 10/09/15 1927 10/10/15 0536 10/10/15 1109  WBC 8.4  < > 11.1* 8.4 6.3 8.3 9.1  NEUTROABS 6.8  --   --   --   --   --   --   HGB 15.0  < > 13.9 14.3 13.6 13.7 14.3  HCT 46.4  < > 43.0 42.7 42.9 44.0  45.2  MCV 76.1*  < > 76.1* 76.5* 77.4* 77.7* 77.4*  PLT 175  < > 151 156 153 151 158  < > = values in this interval not displayed.  Cardiac Enzymes: No results for input(s): CKTOTAL, CKMB, CKMBINDEX, TROPONINI in the last 168 hours.  BNP: BNP (last 3 results)  Recent Labs  10/06/15 1939  BNP 1374.4*    ProBNP (last 3 results) No results for input(s): PROBNP in the last 8760 hours.    Other results:  Imaging: No results found.   Medications:     Scheduled Medications: . aspirin EC  81 mg Oral Daily  . carvedilol  12.5 mg Oral BID WC  . fondaparinux (ARIXTRA) injection  2.5 mg Subcutaneous Q0600  . insulin aspart  0-20 Units Subcutaneous TID WC  . insulin aspart  0-5 Units Subcutaneous QHS  . insulin aspart  10 Units Subcutaneous BID WC  . insulin glargine  30 Units Subcutaneous QHS  . pantoprazole  40 mg Oral Daily  . piperacillin-tazobactam (ZOSYN)  IV  3.375 g Intravenous Q8H  . pregabalin  150 mg Oral QHS  . rosuvastatin  20 mg Oral Daily  . senna-docusate  1 tablet Oral BID  . sodium chloride flush  10-40 mL Intracatheter Q12H  . sodium chloride flush  3 mL Intravenous Q12H    Infusions: . lactated ringers 10 mL/hr at 10/09/15 1502    . lactated ringers Stopped (10/10/15 0553)    PRN Medications: [DISCONTINUED] acetaminophen **OR** acetaminophen, acetaminophen, HYDROmorphone (DILAUDID) injection, morphine injection, ondansetron **OR** ondansetron (ZOFRAN) IV, oxyCODONE-acetaminophen, sodium chloride flush   Assessment:   1. Acute cholecystitis 2.CAD- Multivessel CAD 3. A/C Systolic Heart Failure 4. HTN 5. Hyperlipidemia   Plan/Discussion:    Doing well. Can go home today.  HF meds:  Benicar 10 (was on 20 at home) Lasix 40 daily Carvedilol 12.5 bid (was on 25 bid) Ecasa 81 daily Spiro 12.5 daily Digoxin 0.125 daily Crestor 20 daily  F/u HF clinic later this week   Once he recovers from chole plan 2-V PCI in several weeks.   Length of Stay: 5  Berneita Sanagustin MD 10/11/2015, 10:39 AM   Advanced Heart Failure Team Pager [phone] (M-F; 7a - 4p)  Please contact [facility] for night-coverage after hours (4p -7a ) and weekends on [email or web address]

## 2015-10-13 ENCOUNTER — Telehealth: Payer: Self-pay

## 2015-10-13 MED FILL — ?PANTOPRAZOLE SOD DR 40MG: 40 MG | 30 days supply | Qty: 30 | Fill #0

## 2015-10-13 MED FILL — ?SPIRONOLACTONE 25 MG TABLE: 25 | 30 days supply | Qty: 30 | Fill #0

## 2015-10-13 MED FILL — OXYCODONE/APAP 5-325: 5-325 | 4 days supply | Qty: 30 | Fill #0

## 2015-10-13 MED FILL — FUROSEMIDE 20 MG TABLET: 20 | 30 days supply | Qty: 60 | Fill #0

## 2015-10-13 MED FILL — ?DIGITEK 125 MCG TABLET: 125 | 30 days supply | Qty: 30 | Fill #0

## 2015-10-13 NOTE — Telephone Encounter (Signed)
[facility] Post-discharge Follow-Up Phone Call:  Attempt # 1  Date of Discharge:  10/11/2015 Principal Discharge Diagnosis(es):  Cholecystitis, HTN, DM, acute on chronic systolic heart failure Post-discharge Communication: Call placed to # [phone] and a HIPAA compliant voice mail message was left requesting a call back to # [phone] or [phone]. A call was also placed to # [phone] and the patient's wife, Haisa, answered and stated that the patient was asleep. This CM left a message with Haisa requesting that that the patient return the call to [phone] or [phone].  Call Completed: No

## 2015-10-14 ENCOUNTER — Telehealth: Payer: Self-pay

## 2015-10-14 NOTE — Telephone Encounter (Signed)
[facility] Post-discharge Follow-Up Phone Call:  Attempt #2  Date of Discharge: 10/11/2015 Principal Discharge Diagnosis(es):cholecystitis, HTN, DM, acute on chronic systolic heart failure. Post-discharge Communication: (Clearly document all attempts clearly and date contact made) Call placed to # [phone] and a HIPAA compliant voice mail message was left requesting a call back to # [phone] or [phone].  Call then placed to # [phone] and the patient's wife answered and stated that he was asleep and she would have him call back at a later time.  Call Completed: No

## 2015-10-15 ENCOUNTER — Telehealth: Payer: Self-pay

## 2015-10-15 NOTE — Telephone Encounter (Signed)
[facility] Post-discharge Follow-Up Phone Call:  Date of Discharge: 10/11/15 Principal Discharge Diagnosis(es): cholecystitis, HTN, DM, acute on chronic systolic heart failure Post-discharge Communication: Return phone call received from patient and discharge follow-up phone call completed. Call Completed: Yes                    With Whom: Patient     Please check all that apply:  X  Patient is knowledgeable of his/her condition(s) and/or treatment. X  Patient is caring for self at home.  ? Patient is receiving assist at home from family and/or caregiver. Family and/or caregiver is knowledgeable of patient's condition(s) and/or treatment. ? Patient is receiving home health services. If so, name of agency.     Medication Reconciliation:  ? Medication list reviewed with patient. X  Patient obtained all discharge medications-YES. Patient indicated he obtained all discharge medications and denied any questions about his medications. Discussed importance of compliance.   Activities of Daily Living:  X  Independent ? Needs assist  ? Total Care    Community resources in place for patient:  X  None  ? Home Health/Home DME ? Assisted Living ? Support Group         Questions/Concerns discussed: This Case Manager placed call to patient to discuss upcoming [facility] appointment on 10/16/15 at 1145 with Dr. Venetia Night. Patient indicated he would not be able to make it to appointment because he has an appointment scheduled with Cardiologist on 10/16/15 at 1120. PCI planned once patient recovers from cholecystectomy. Appointment rescheduled for 10/28/15 at 1130 with Dr. Venetia Night. Dr. Hyman Hopes did not have any earlier availability. Inquired about patient's status. He indicated he was doing well and denied any problems or health concerns at this time. Patient appreciative of call. No additional needs/concerns identified.

## 2015-10-15 NOTE — Telephone Encounter (Signed)
[facility] Clinic Post-discharge Follow-Up Phone Call:  Date of Discharge: 10/11/15 Principal Discharge Diagnosis(es): Acute cholecystitis, acute on chronic systolic heart failure, coronary artery disease Post-discharge Communication: Attempt #3 to reach patient to complete post-discharge follow-up phone call. Call placed to #[phone]; unable to reach patient.  HIPPA compliant voicemail left requesting return call. In addition, call placed to #[phone]; patient's spouse answered the phone and indicated patient was "sleeping." HIPPA compliant message left with patient's spouse requesting patient return call to this Case Manager. Call completed: No-awaiting return call from patient.

## 2015-10-16 ENCOUNTER — Ambulatory Visit (HOSPITAL_COMMUNITY)
Admission: RE | Admit: 2015-10-16 | Discharge: 2015-10-16 | Disposition: A | Discharge status: Home / Self Care | Payer: No Typology Code available for payment source | Source: Ambulatory Visit | Attending: Internal Medicine | Admitting: Internal Medicine

## 2015-10-16 ENCOUNTER — Inpatient Hospital Stay: Payer: No Typology Code available for payment source | Admitting: Family Medicine

## 2015-10-16 VITALS — BP 120/86 | HR 73 | Wt 241.6 lb

## 2015-10-16 DIAGNOSIS — I2510 Atherosclerotic heart disease of native coronary artery without angina pectoris: Secondary | ICD-10-CM

## 2015-10-16 DIAGNOSIS — Z823 Family history of stroke: Secondary | ICD-10-CM | POA: Insufficient documentation

## 2015-10-16 DIAGNOSIS — Z7982 Long term (current) use of aspirin: Secondary | ICD-10-CM | POA: Insufficient documentation

## 2015-10-16 DIAGNOSIS — I5023 Acute on chronic systolic (congestive) heart failure: Secondary | ICD-10-CM

## 2015-10-16 DIAGNOSIS — Z95810 Presence of automatic (implantable) cardiac defibrillator: Secondary | ICD-10-CM | POA: Insufficient documentation

## 2015-10-16 DIAGNOSIS — Z794 Long term (current) use of insulin: Secondary | ICD-10-CM | POA: Insufficient documentation

## 2015-10-16 DIAGNOSIS — I5022 Chronic systolic (congestive) heart failure: Secondary | ICD-10-CM | POA: Insufficient documentation

## 2015-10-16 DIAGNOSIS — E7800 Pure hypercholesterolemia, unspecified: Secondary | ICD-10-CM | POA: Insufficient documentation

## 2015-10-16 DIAGNOSIS — I110 Hypertensive heart disease with heart failure: Secondary | ICD-10-CM | POA: Insufficient documentation

## 2015-10-16 DIAGNOSIS — K219 Gastro-esophageal reflux disease without esophagitis: Secondary | ICD-10-CM | POA: Insufficient documentation

## 2015-10-16 DIAGNOSIS — Z9889 Other specified postprocedural states: Secondary | ICD-10-CM | POA: Insufficient documentation

## 2015-10-16 DIAGNOSIS — Z79899 Other long term (current) drug therapy: Secondary | ICD-10-CM | POA: Insufficient documentation

## 2015-10-16 DIAGNOSIS — E119 Type 2 diabetes mellitus without complications: Secondary | ICD-10-CM | POA: Insufficient documentation

## 2015-10-16 DIAGNOSIS — I428 Other cardiomyopathies: Secondary | ICD-10-CM | POA: Insufficient documentation

## 2015-10-16 DIAGNOSIS — E785 Hyperlipidemia, unspecified: Secondary | ICD-10-CM | POA: Insufficient documentation

## 2015-10-16 DIAGNOSIS — Z8249 Family history of ischemic heart disease and other diseases of the circulatory system: Secondary | ICD-10-CM | POA: Insufficient documentation

## 2015-10-16 DIAGNOSIS — Z833 Family history of diabetes mellitus: Secondary | ICD-10-CM | POA: Insufficient documentation

## 2015-10-16 MED ORDER — OLMESARTAN MEDOXOMIL 20 MG PO TABS: 20.0000 mg | ORAL_TABLET | Freq: Every day | ORAL | Status: DC

## 2015-10-16 NOTE — Patient Instructions (Signed)
INCREASE Benicar to 20 mg, one tab daily  Labs needed in 10 days  You have been referred to [facility] Dr Katrinka Blazing  Your physician recommends that you schedule a follow-up appointment in: 8 weeks with Dr.Bensimhon

## 2015-10-16 NOTE — Progress Notes (Signed)
Patient ID: Benjamin [name], male   DOB: 18-Apr-1961, 55 y.o.   MRN: [id number] PCP: Primary Cardiologist: Dr Ladona Ridgel   HPI: Mr Valent is 55 year old male with history of nonischemic cardiomyopathy hypertension, diabetes mellitus, hyperlipidemia, chronic systolic heart failure, S/P St Jude bi-V ICD implanted October 2016. His last echocardiogram was August 2016 ejection fraction was 20% with diffuse hypokinesis. Mild LVH. Mild MR. Left atrium severely dilated.  Admitted in early March with abdominal pain and increased SOB with exertion. Cardilogy consulted for surgical clearance. Due to many risk factors he was sent for [facility]. LHC showed complex, calcified, multivessel coronary disease with high-grade obstruction in the mid LAD and distal RCA. He had laparoscopic cholecystectomy. Plan for 2V PCI by Dr Katrinka Blazing after he recovers from cholecystectomy. Diuresed with IV lasix and transitioned to lasix 40 mg daily. Discharge weight was 243 pounds.   Today he returns for post hospital follow up. Overall feeling ok. Still has abdominal pain from surgery.Weight at home 238 pounds. Denies SOB/PND/Orthopnea. Does admit to mild dyspnea with steps. Tries to follow low salt diet.  He is not working. Taking all medications.    LHC 10/07/2015  - Dr Katrinka Blazing  Prox RCA to Mid RCA lesion, 20% stenosed.  Dist RCA lesion, 90% stenosed.  RPDA lesion, 90% stenosed.  1st Diag lesion, 100% stenosed.  Mid LAD lesion, 95% stenosed.  Dist LAD lesion, 90% stenosed.  Ost Cx lesion, 60% stenosed.  2nd Mrg lesion, 95% stenosed.  Prox Cx to Mid Cx lesion, 60% stenosed.  LVEF 20-25%.    ROS: All systems negative except as listed in HPI, PMH and Problem List.  SH:  Social History   Social History  . Marital Status: Married    Spouse Name: N/A  . Number of Children: N/A  . Years of Education: N/A   Occupational History  . Not on file.   Social History Main Topics  . Smoking status: Never Smoker   . Smokeless tobacco:  Never Used  . Alcohol Use: No  . Drug Use: No  . Sexual Activity: No   Other Topics Concern  . Not on file   Social History Narrative    FH:  Family History  Problem Relation Age of Onset  . Diabetes Mother   . Hypertension Mother   . Hyperlipidemia Mother   . Stroke Mother   . Diabetes Father   . Hypertension Father   . Stroke Father 32    Past Medical History  Diagnosis Date  . Hypertension   . High cholesterol   . Cholelithiasis   . Chronic systolic heart failure (HCC)   . AICD (automatic cardioverter/defibrillator) present   . Kidney stone   . Coronary artery disease   . Pneumonia 2002  . Type II diabetes mellitus (HCC)   . GERD (gastroesophageal reflux disease)     Current Outpatient Prescriptions  Medication Sig Dispense Refill  . aspirin EC 81 MG tablet Take 81 mg by mouth daily.    . carvedilol (COREG) 25 MG tablet Take 12.5 mg by mouth 2 (two) times daily with a meal.    . digoxin (LANOXIN) 0.125 MG tablet Take 1 tablet (0.125 mg total) by mouth daily. 30 tablet 0  . furosemide (LASIX) 20 MG tablet Take 20 mg by mouth 2 (two) times daily.    . insulin aspart (NOVOLOG FLEXPEN) 100 UNIT/ML FlexPen Inject 10 Units into the skin 2 (two) times daily with a meal.    . insulin glargine (LANTUS)  100 UNIT/ML injection Inject 25 Units into the skin at bedtime.    . metFORMIN (GLUCOPHAGE) 1000 MG tablet Take 1,000 mg by mouth daily with breakfast.    . olmesartan (BENICAR) 20 MG tablet Take 0.5 tablets (10 mg total) by mouth daily. 30 tablet 0  . oxyCODONE-acetaminophen (PERCOCET/ROXICET) 5-325 MG tablet Take 1-2 tablets by mouth every 6 (six) hours as needed for moderate pain. 30 tablet 0  . pantoprazole (PROTONIX) 40 MG tablet Take 1 tablet (40 mg total) by mouth daily. 30 tablet 0  . pregabalin (LYRICA) 75 MG capsule Take 150 mg by mouth at bedtime.    . rosuvastatin (CRESTOR) 20 MG tablet Take 1 tablet (20 mg total) by mouth daily. 90 tablet 3   No current  facility-administered medications for this encounter.    Filed Vitals:   10/16/15 1117  BP: 120/86  Pulse: 73  Weight: 241 lb 9.6 oz (109.589 kg)  SpO2: 95%    PHYSICAL EXAM: General:  Well appearing. No resp difficulty HEENT: normal Neck: supple. JVP 5-6. Carotids 2+ bilaterally; no bruits. No lymphadenopathy or thryomegaly appreciated. Cor: PMI normal. Regular rate & rhythm. No rubs, gallops or murmurs. Lungs: clear Abdomen: obese, soft, nontender, nondistended. No hepatosplenomegaly. No bruits or masses. Good bowel sounds. Extremities: no cyanosis, clubbing, rash, edema Neuro: alert & orientedx3, cranial nerves grossly intact. Moves all 4 extremities w/o difficulty. Affect pleasant.      ASSESSMENT & PLAN: 1. S/P cholecystitis-  2.CAD- Multivessel CAD- LHC 10/2015 with Complex, calcified, multivessel coronary disease with high-grade obstruction in the mid LAD and distal RCA. Refer back to Dr Katrinka Blazing for 2-V PCI.  On aspirin, crestor, bb. No ischemia 3. Chronic  Systolic Heart Failure-Mixed ICM/NICM. ECHO 2016 with EF 20%. Has St Jude ICD. NYHA II.  Volume status stable. Continue lasix 20 mg twice a day Continue carvedilol 12.5 mg twice a day. Continue dig 0.125 mg daily. Check dig level next week.  Increase benicar to 20 mg daily. Check BMET next week. Would like to transition to entresto soon.  I discussed restarting spiro however he declines and says he has had hyperkalemia in the past  and does not want to take it.  Reinfoced low salt diet, daily weights, and limiting fluid intake to < 2 liters per day.  4. HTN- stable- Increase benicar as above 5. Hyperlipidemia- Continue crestor daily   Today I will set up follow up with Dr Katrinka Blazing. He will follow up in 8 weeks with Dr Gala Romney.   Amy Clegg NP-C  11:59 AM

## 2015-10-16 NOTE — Progress Notes (Signed)
Advanced Heart Failure Medication Review by a Pharmacist  Does the patient  feel that his/her medications are working for him/her?  yes  Has the patient been experiencing any side effects to the medications prescribed?  no  Does the patient measure his/her own blood pressure or blood glucose at home?  no   Does the patient have any problems obtaining medications due to transportation or finances?   no  Understanding of regimen: good Understanding of indications: good Potential of compliance: good Patient understands to avoid NSAIDs. Patient understands to avoid decongestants.  Issues to address at subsequent visits: None   Pharmacist comments:  Benjamin [name] is a pleasant 55 yo M presenting with a medication list from his recent hospital discharge. He reports good compliance with his regimen but states that he has not been on spironolactone since he was told that his potassium was elevated by another MD.   Tyler Deis. Bonnye Fava, PharmD, BCPS, CPP Clinical Pharmacist Pager: [phone] Phone: [phone] 10/16/2015 11:37 AM      Time with patient: 10 minutes Preparation and documentation time: 2 minutes Total time: 12 minutes

## 2015-10-20 ENCOUNTER — Telehealth: Payer: Self-pay

## 2015-10-20 NOTE — Telephone Encounter (Signed)
Spoke with pt. Pt aware of an appt with Dr.Smith for 3/22 @ 10:15am

## 2015-10-20 NOTE — Telephone Encounter (Signed)
-----   Message from Lyn Records, MD sent at 10/18/2015 10:24 AM EDT ----- Regarding: RE: Follow Up Dr Katrinka Blazing He needs to get in SOON! ----- Message -----    From: Sherald Hess, NP    Sent: 10/16/2015   1:05 PM      To: Dolores Patty, MD, Lyn Records, MD, # Subject: Follow Up Dr Leodis Liverpool,   Please set up follow up with Mr Moulin. Dr Katrinka Blazing did LHC in March with plans for 2V PCI. Set up in the next couple of weeks.   We tried to schedule today but he has not appointments until the end of June.   Thanks  Amy [employer]

## 2015-10-21 NOTE — Progress Notes (Signed)
We will need to myocardial viability study before PCI given ejection fraction of 20%.     Cardiology Office Note   Date:  10/22/2015   ID:  Benjamin [name], DOB 1960-09-28, MRN [id number]  PCP:  Benjamin Lewandowsky, MD  Cardiologist:  Lesleigh Noe, MD   Chief Complaint  Patient presents with  . Follow-up    no sx      History of Present Illness: Benjamin [name] is a 55 y.o. male who presents for Ischemic cardiomyopathy, high-grade calcified mid LAD disease and diffuse calcified mid to distal RCA, diffuse distal vessel coronary disease in all 3 territories, LVEF less than 20% and October, BiV AICD October 2016, and recent hospitalization for decompensated heart failure.  The patient was hospitalized with heart failure in February. He had elevated troponin levels which eventually led to coronary angiography. Angiography demonstrated diffuse coronary disease in all 3 territories with focal mid LAD disease and severe distal RCA disease. All 3 vessels had severe diffuse distal anatomy that would prevent surgical therapy. After coronary angiography was seen by the heart feed team. He is much better compensated currently. He is able to lie flat. He is not having as much dyspnea on exertion. He denies chest discomfort at any point in the past and has no history of myocardial infarction. He does have a history of poorly controlled blood pressure, and for some time it has been assumed that his cardiomyopathy is related to hypertensive heart disease.  The patient is referred back now to be considered for CHIP of the LAD and RCA.    Past Medical History  Diagnosis Date  . Hypertension   . High cholesterol   . Cholelithiasis   . Chronic systolic heart failure (HCC)   . AICD (automatic cardioverter/defibrillator) present   . Kidney stone   . Coronary artery disease   . Pneumonia 2002  . Type II diabetes mellitus (HCC)   . GERD (gastroesophageal reflux disease)     Past Surgical History    Procedure Laterality Date  . Colonoscopy N/A 07/12/2013    Procedure: COLONOSCOPY;  Surgeon: Shirley Friar, MD;  Location: [facility] ENDOSCOPY;  Service: Endoscopy;  Laterality: N/A;  . Ep implantable device N/A 05/09/2015    Procedure: BiV ICD Insertion CRT-D;  Surgeon: Marinus Maw, MD;  Location: [facility] INVASIVE CV LAB;  Service: Cardiovascular;  Laterality: N/A;  . Cholecystectomy  10/09/2015    Procedure: LAPAROSCOPIC CHOLECYSTECTOMY;  Surgeon: Claud Kelp, MD;  Location: [facility] OR;  Service: General;;  . Cardiac catheterization N/A 10/07/2015    Procedure: Left Heart Cath and Coronary Angiography;  Surgeon: Lyn Records, MD;  Location: [facility] INVASIVE CV LAB;  Service: Cardiovascular;  Laterality: N/A;     Current Outpatient Prescriptions  Medication Sig Dispense Refill  . aspirin EC 81 MG tablet Take 81 mg by mouth daily.    . carvedilol (COREG) 25 MG tablet Take 12.5 mg by mouth 2 (two) times daily with a meal.    . digoxin (LANOXIN) 0.125 MG tablet Take 1 tablet (0.125 mg total) by mouth daily. 30 tablet 0  . furosemide (LASIX) 20 MG tablet Take 20 mg by mouth 2 (two) times daily.    . insulin aspart (NOVOLOG FLEXPEN) 100 UNIT/ML FlexPen Inject 10 Units into the skin 2 (two) times daily with a meal.    . insulin glargine (LANTUS) 100 UNIT/ML injection Inject 25 Units into the skin at bedtime.    . metFORMIN (GLUCOPHAGE) 1000 MG tablet  Take 1,000 mg by mouth daily with breakfast.    . olmesartan (BENICAR) 20 MG tablet Take 1 tablet (20 mg total) by mouth daily. 30 tablet 6  . oxyCODONE-acetaminophen (PERCOCET/ROXICET) 5-325 MG tablet Take 1-2 tablets by mouth every 6 (six) hours as needed for moderate pain. 30 tablet 0  . pantoprazole (PROTONIX) 40 MG tablet Take 1 tablet (40 mg total) by mouth daily. 30 tablet 0  . pregabalin (LYRICA) 75 MG capsule Take 150 mg by mouth at bedtime.    . rosuvastatin (CRESTOR) 20 MG tablet Take 1 tablet (20 mg total) by mouth daily. 90 tablet 3   No current  facility-administered medications for this visit.    Allergies:   Heparin; Lovenox; and Pork-derived products    Social History:  The patient  reports that he has never smoked. He has never used smokeless tobacco. He reports that he does not drink alcohol or use illicit drugs.   Family History:  The patient's family history includes Diabetes in his father and mother; Hyperlipidemia in his mother; Hypertension in his father and mother; Stroke in his mother; Stroke (age of onset: 47) in his father.    ROS:  Please see the history of present illness.   Otherwise, review of systems are positive for Anxiety concerning the presence of coronary disease. "I want to have something done". He denies chest discomfort. He is still having some constipation related to recent cholecystectomy. He had cholecystectomy with low LVEF and diffuse coronary disease without any complications. He is compliant with his current medical regimen as listed above.   All other systems are reviewed and negative.    PHYSICAL EXAM: VS:  BP 120/70 mmHg  Pulse 68  Ht 5\' 7"  (1.702 m)  Wt 237 lb 12.8 oz (107.865 kg)  BMI 37.24 kg/m2  SpO2 96% , BMI Body mass index is 37.24 kg/(m^2). GEN: Well nourished, well developed, in no acute distress HEENT: normal Neck: no JVD, carotid bruits, or masses Cardiac: RRR.  There is no murmur, rub, or gallop. There is no edema. Respiratory:  clear to auscultation bilaterally, normal work of breathing. GI: soft, nontender, nondistended, + BS MS: no deformity or atrophy Skin: warm and dry, no rash Neuro:  Strength and sensation are intact Psych: euthymic mood, full affect   EKG:  EKG is not ordered today.   Recent Labs: 10/06/2015: B Natriuretic Peptide 1374.4* 10/08/2015: Magnesium 2.0 10/11/2015: ALT 29; BUN 19; Creatinine, Ser 1.17; Hemoglobin 14.8; Platelets 173; Potassium 3.9; Sodium 136    Lipid Panel    Component Value Date/Time   CHOL 172 03/27/2015 1233   TRIG 81 03/27/2015  1233   HDL 36* 03/27/2015 1233   CHOLHDL 4.8 03/27/2015 1233   VLDL 16 03/27/2015 1233   LDLCALC 120 03/27/2015 1233      Wt Readings from Last 3 Encounters:  10/22/15 237 lb 12.8 oz (107.865 kg)  10/16/15 241 lb 9.6 oz (109.589 kg)  10/11/15 243 lb 9.6 oz (110.496 kg)      Other studies Reviewed: Additional studies/ records that were reviewed today include: Review of recent hospital stay. Review of prior echocardiogram. The most recent echo study was performed in August 2016 prior to ASCVD. LV function has not been reassessed since that time. At cardiac catheterization, LV filling pressures were noted to be extremely elevated, with LVEDP 29 mmHg.    ASSESSMENT AND PLAN:  1. Coronary artery disease involving native coronary artery of native heart without angina pectoris Severe diffuse right  coronary and LAD disease. Intervention if performed will require rotational atherectomy and hemodynamic support with Impella.  2. Chronic systolic congestive heart failure (HCC) Ischemic in etiology. Overall functional status is improved with management by the advanced heart failure team.   3. S/P ICD (internal cardiac defibrillator) procedure Placed in October 2016 for severe heart failure with persistent low LVEF on medical therapy.  4. Hypertensive heart disease with heart failure (HCC) Long-standing poorly controlled blood pressure.  5. Uncontrolled type 2 diabetes mellitus with ketoacidosis without coma, without long-term current use of insulin (HCC) Under control    Current medicines are reviewed at length with the patient today.  The patient has the following concerns regarding medicines: None.  The following changes/actions have been instituted:    Thallium myocardial viability study. Would prefer an MRI viability study but his device prevents this study from being performed.  I reviewed the coronary angiogram and prior echoes with the patient to help them understand the high  risk nature of coronary intervention. He understands the potential need for rotational atherectomy, hemodynamic support with Impella, and the need to perform the procedure from bilateral femoral approach.  Prior to proceeding with PCI, we need to demonstrate viability in the mid to distal anterior wall and in the inferior wall. If no viability exists, performing PCI will have no benefits. We spent significant time discussing this, and the concept is still difficult for the patient to grasp.  After seeing the viability study, we will set up the CHIP over the phone thus avoiding another office visit.  Labs/ tests ordered today include:  No orders of the defined types were placed in this encounter.    This was an extensive, which exceeded 90 minutes with greater than 70% spent in coordination of care and discussion concerning complex clinical decision making   Disposition:   FU with HS in 6 weeks  Signed, Lesleigh Noe, MD  10/22/2015 11:12 AM    [facility] [address] [place], [place], [place]  [address] Phone: [phone]; Fax: [phone]

## 2015-10-22 ENCOUNTER — Encounter: Payer: Self-pay | Admitting: Interventional Cardiology

## 2015-10-22 ENCOUNTER — Ambulatory Visit (INDEPENDENT_AMBULATORY_CARE_PROVIDER_SITE_OTHER): Payer: No Typology Code available for payment source | Admitting: Interventional Cardiology

## 2015-10-22 VITALS — BP 120/70 | HR 68 | Ht 67.0 in | Wt 237.8 lb

## 2015-10-22 DIAGNOSIS — E1310 Other specified diabetes mellitus with ketoacidosis without coma: Secondary | ICD-10-CM

## 2015-10-22 DIAGNOSIS — I255 Ischemic cardiomyopathy: Secondary | ICD-10-CM

## 2015-10-22 DIAGNOSIS — I2510 Atherosclerotic heart disease of native coronary artery without angina pectoris: Secondary | ICD-10-CM

## 2015-10-22 DIAGNOSIS — Z95810 Presence of automatic (implantable) cardiac defibrillator: Secondary | ICD-10-CM

## 2015-10-22 DIAGNOSIS — I110 Hypertensive heart disease with heart failure: Secondary | ICD-10-CM

## 2015-10-22 DIAGNOSIS — E1110 Type 2 diabetes mellitus with ketoacidosis without coma: Secondary | ICD-10-CM

## 2015-10-22 DIAGNOSIS — I5022 Chronic systolic (congestive) heart failure: Secondary | ICD-10-CM

## 2015-10-22 NOTE — Patient Instructions (Signed)
Medication Instructions:  Your physician recommends that you continue on your current medications as directed. Please refer to the Current Medication list given to you today.   Labwork: None ordered  Testing/Procedures: Your physician has recommended that you have a Thallium Viability Study (To be schedule at our [facility] office)  Follow-Up: Your physician recommends that you schedule a follow-up appointment pending test results   Any Other Special Instructions Will Be Listed Below (If Applicable).     If you need a refill on your cardiac medications before your next appointment, please call your pharmacy.

## 2015-10-24 ENCOUNTER — Ambulatory Visit (HOSPITAL_COMMUNITY)
Admission: RE | Admit: 2015-10-24 | Discharge: 2015-10-24 | Disposition: A | Discharge status: Home / Self Care | Payer: No Typology Code available for payment source | Source: Ambulatory Visit | Attending: Cardiology | Admitting: Cardiology

## 2015-10-24 DIAGNOSIS — I5022 Chronic systolic (congestive) heart failure: Secondary | ICD-10-CM

## 2015-10-24 DIAGNOSIS — I5023 Acute on chronic systolic (congestive) heart failure: Secondary | ICD-10-CM

## 2015-10-24 LAB — BASIC METABOLIC PANEL
Anion gap: 9 (ref 5–15)
BUN: 18 mg/dL (ref 6–20)
CO2: 24 mmol/L (ref 22–32)
CREATININE: 0.91 mg/dL (ref 0.61–1.24)
Calcium: 9.3 mg/dL (ref 8.9–10.3)
Chloride: 105 mmol/L (ref 101–111)
GFR calc Af Amer: >60 mL/min (ref >60)
GLUCOSE: 191 mg/dL — AB (ref 65–99)
Potassium: 4.4 mmol/L (ref 3.5–5.1)
SODIUM: 138 mmol/L (ref 135–145)

## 2015-10-28 ENCOUNTER — Ambulatory Visit (HOSPITAL_COMMUNITY): Payer: No Typology Code available for payment source | Attending: Cardiology

## 2015-10-28 ENCOUNTER — Ambulatory Visit (HOSPITAL_COMMUNITY): Payer: No Typology Code available for payment source

## 2015-10-28 ENCOUNTER — Inpatient Hospital Stay: Payer: No Typology Code available for payment source | Admitting: Family Medicine

## 2015-10-28 DIAGNOSIS — I10 Essential (primary) hypertension: Secondary | ICD-10-CM | POA: Insufficient documentation

## 2015-10-28 DIAGNOSIS — E109 Type 1 diabetes mellitus without complications: Secondary | ICD-10-CM | POA: Insufficient documentation

## 2015-10-28 DIAGNOSIS — R0602 Shortness of breath: Secondary | ICD-10-CM | POA: Insufficient documentation

## 2015-10-28 DIAGNOSIS — I255 Ischemic cardiomyopathy: Secondary | ICD-10-CM | POA: Insufficient documentation

## 2015-10-28 DIAGNOSIS — I2510 Atherosclerotic heart disease of native coronary artery without angina pectoris: Secondary | ICD-10-CM | POA: Insufficient documentation

## 2015-10-28 MED ORDER — THALLOUS CHLORIDE TL 201 1 MCI/ML IV SOLN
4.2000 | Freq: Once | INTRAVENOUS | Status: AC | PRN
  Administered 2015-10-28: 4 via INTRAVENOUS

## 2015-10-29 ENCOUNTER — Ambulatory Visit (HOSPITAL_COMMUNITY): Payer: No Typology Code available for payment source | Attending: Cardiovascular Disease

## 2015-10-29 ENCOUNTER — Telehealth: Payer: Self-pay

## 2015-10-29 NOTE — Telephone Encounter (Signed)
This Case Manager placed call to patient to inquire about status and to remind him of upcoming appointment with Dr. Venetia Night on 10/30/15 at 0945. Patient indicated he was feeling "fine" and did not have any questions or concerns regarding his health at this time. Patient indicated he planned to be at his upcoming appointment. Reminded patient to bring all current medications to his appointment as well as blood glucose log for Dr. Venetia Night to review. Patient indicated he did not keep a log of his blood glucoses; however, they were stored in his glucometer. Requested patient bring his glucometer to his upcoming appointment. Patient verbalized understanding. No transportation barriers noted. No additional needs/concerns identified.

## 2015-10-30 ENCOUNTER — Ambulatory Visit: Payer: No Typology Code available for payment source | Attending: Family Medicine | Admitting: Family Medicine

## 2015-10-30 ENCOUNTER — Encounter: Payer: Self-pay | Admitting: Family Medicine

## 2015-10-30 VITALS — BP 141/95 | HR 72 | Temp 98.0°F | Resp 16 | Ht 67.0 in | Wt 233.0 lb

## 2015-10-30 DIAGNOSIS — I5022 Chronic systolic (congestive) heart failure: Secondary | ICD-10-CM

## 2015-10-30 DIAGNOSIS — I2510 Atherosclerotic heart disease of native coronary artery without angina pectoris: Secondary | ICD-10-CM

## 2015-10-30 DIAGNOSIS — E119 Type 2 diabetes mellitus without complications: Secondary | ICD-10-CM

## 2015-10-30 DIAGNOSIS — Z95810 Presence of automatic (implantable) cardiac defibrillator: Secondary | ICD-10-CM

## 2015-10-30 DIAGNOSIS — I10 Essential (primary) hypertension: Secondary | ICD-10-CM

## 2015-10-30 DIAGNOSIS — Z794 Long term (current) use of insulin: Secondary | ICD-10-CM

## 2015-10-30 MED ORDER — GLUCOSE BLOOD VI STRP: ORAL_STRIP | Status: DC

## 2015-10-30 MED ORDER — TRUEPLUS LANCETS 28G MISC: 1.0000 | Freq: Three times a day (TID) | Status: DC

## 2015-10-30 MED ORDER — TRUE METRIX METER W/DEVICE KIT: PACK | Status: DC

## 2015-10-30 MED FILL — TRUE METRIX BLOOD GLUCOSE M: W/DEVICE | 1 days supply | Qty: 1 | Fill #0

## 2015-10-30 MED FILL — TRUE METRIX TEST STRIP: 25 days supply | Qty: 100 | Fill #0

## 2015-10-30 MED FILL — CARVEDILOL 25 MG TABLET: 25 | 90 days supply | Qty: 180 | Fill #0

## 2015-10-30 MED FILL — TRUEplus LANCETS 28G MISC: 30 days supply | Qty: 100 | Fill #0

## 2015-10-30 NOTE — Patient Instructions (Addendum)
Please call [facility] to schedule a follow up appointment @ [phone]     Hypertension Hypertension, commonly called high blood pressure, is when the force of blood pumping through your arteries is too strong. Your arteries are the blood vessels that carry blood from your heart throughout your body. A blood pressure reading consists of a higher number over a lower number, such as 110/72. The higher number (systolic) is the pressure inside your arteries when your heart pumps. The lower number (diastolic) is the pressure inside your arteries when your heart relaxes. Ideally you want your blood pressure below 120/80. Hypertension forces your heart to work harder to pump blood. Your arteries may become narrow or stiff. Having untreated or uncontrolled hypertension can cause heart attack, stroke, kidney disease, and other problems. RISK FACTORS Some risk factors for high blood pressure are controllable. Others are not.  Risk factors you cannot control include:   Race. You may be at higher risk if you are African American.  Age. Risk increases with age.  Gender. Men are at higher risk than women before age 26 years. After age 87, women are at higher risk than men. Risk factors you can control include:  Not getting enough exercise or physical activity.  Being overweight.  Getting too much fat, sugar, calories, or salt in your diet.  Drinking too much alcohol. SIGNS AND SYMPTOMS Hypertension does not usually cause signs or symptoms. Extremely high blood pressure (hypertensive crisis) may cause headache, anxiety, shortness of breath, and nosebleed. DIAGNOSIS To check if you have hypertension, your health care provider will measure your blood pressure while you are seated, with your arm held at the level of your heart. It should be measured at least twice using the same arm. Certain conditions can cause a difference in blood pressure between your right and left arms. A  blood pressure reading that is higher than normal on one occasion does not mean that you need treatment. If it is not clear whether you have high blood pressure, you may be asked to return on a different day to have your blood pressure checked again. Or, you may be asked to monitor your blood pressure at home for 1 or more weeks. TREATMENT Treating high blood pressure includes making lifestyle changes and possibly taking medicine. Living a healthy lifestyle can help lower high blood pressure. You may need to change some of your habits. Lifestyle changes may include:  Following the DASH diet. This diet is high in fruits, vegetables, and whole grains. It is low in salt, red meat, and added sugars.  Keep your sodium intake below 2,300 mg per day.  Getting at least 30-45 minutes of aerobic exercise at least 4 times per week.  Losing weight if necessary.  Not smoking.  Limiting alcoholic beverages.  Learning ways to reduce stress. Your health care provider may prescribe medicine if lifestyle changes are not enough to get your blood pressure under control, and if one of the following is true:  You are 70-71 years of age and your systolic blood pressure is above 140.  You are 1 years of age or older, and your systolic blood pressure is above 150.  Your diastolic blood pressure is above 90.  You have diabetes, and your systolic blood pressure is over 140 or your diastolic blood pressure is over 90.  You have kidney disease and your blood pressure is above 140/90.  You have heart disease and your blood pressure is above 140/90. Your personal  target blood pressure may vary depending on your medical conditions, your age, and other factors. HOME CARE INSTRUCTIONS  Have your blood pressure rechecked as directed by your health care provider.   Take medicines only as directed by your health care provider. Follow the directions carefully. Blood pressure medicines must be taken as prescribed.  The medicine does not work as well when you skip doses. Skipping doses also puts you at risk for problems.  Do not smoke.   Monitor your blood pressure at home as directed by your health care provider. SEEK MEDICAL CARE IF:   You think you are having a reaction to medicines taken.  You have recurrent headaches or feel dizzy.  You have swelling in your ankles.  You have trouble with your vision. SEEK IMMEDIATE MEDICAL CARE IF:  You develop a severe headache or confusion.  You have unusual weakness, numbness, or feel faint.  You have severe chest or abdominal pain.  You vomit repeatedly.  You have trouble breathing. MAKE SURE YOU:   Understand these instructions.  Will watch your condition.  Will get help right away if you are not doing well or get worse.   This information is not intended to replace advice given to you by your health care provider. Make sure you discuss any questions you have with your health care provider.   Document Released: 07/19/2005 Document Revised: 12/03/2014 Document Reviewed: 05/11/2013 Elsevier Interactive Patient Education [employer]

## 2015-10-30 NOTE — Progress Notes (Signed)
Subjective:  Patient ID: Benjamin [name], male    DOB: Sep 12, 1960  Age: 55 y.o. MRN: [id number]  CC: Follow-up   HPI Benjamin [name] is a 55 year old male with a history of type 2 diabetes mellitus (A1c 7.3), hypertension, CHF (EF 20% status post AICD) coronary artery disease who comes into the clinic for a follow-up from hospitalization at [facility] from 10/06/15-10/11/15 where he underwent laparoscopic cholecystectomy secondary to cholecystitis.  Preoperative evaluation during hospitalization by means of cardiac cath revealed multivessel CAD with cardiology plans for elective PCI. He was closely followed by the cardiac team and was adequately diuresed due to fluid overload. He had an uneventful postop course and was subsequently discharged.  Today he reports doing well and has minimal pain at the site of his cholecystectomy but is yet to see his general surgeon for follow-up visit. Since discharge he has been to see cardiology and recently had a thallium viability study which revealed large lateral to inferolateral wall infarct from apex to base. He has a follow-up appointment with cardiology.  Denies shortness of breath or pedal edema.  Outpatient Prescriptions Prior to Visit  Medication Sig Dispense Refill  . aspirin EC 81 MG tablet Take 81 mg by mouth daily.    . carvedilol (COREG) 25 MG tablet Take 12.5 mg by mouth 2 (two) times daily with a meal.    . digoxin (LANOXIN) 0.125 MG tablet Take 1 tablet (0.125 mg total) by mouth daily. 30 tablet 0  . furosemide (LASIX) 20 MG tablet Take 20 mg by mouth 2 (two) times daily.    . insulin aspart (NOVOLOG FLEXPEN) 100 UNIT/ML FlexPen Inject 10 Units into the skin 2 (two) times daily with a meal.    . insulin glargine (LANTUS) 100 UNIT/ML injection Inject 25 Units into the skin at bedtime.    . metFORMIN (GLUCOPHAGE) 1000 MG tablet Take 1,000 mg by mouth daily with breakfast.    . olmesartan (BENICAR) 20 MG tablet Take 1 tablet (20 mg total) by mouth  daily. 30 tablet 6  . pregabalin (LYRICA) 75 MG capsule Take 150 mg by mouth at bedtime.    . rosuvastatin (CRESTOR) 20 MG tablet Take 1 tablet (20 mg total) by mouth daily. 90 tablet 3  . oxyCODONE-acetaminophen (PERCOCET/ROXICET) 5-325 MG tablet Take 1-2 tablets by mouth every 6 (six) hours as needed for moderate pain. 30 tablet 0  . pantoprazole (PROTONIX) 40 MG tablet Take 1 tablet (40 mg total) by mouth daily. (Patient not taking: Reported on 10/30/2015) 30 tablet 0   No facility-administered medications prior to visit.    ROS Review of Systems  Constitutional: Negative for activity change and appetite change.  HENT: Negative for sinus pressure and sore throat.   Eyes: Negative for visual disturbance.  Respiratory: Negative for cough, chest tightness and shortness of breath.   Cardiovascular: Negative for chest pain and leg swelling.  Gastrointestinal: Negative for abdominal pain, diarrhea, constipation and abdominal distention.  Endocrine: Negative.   Genitourinary: Negative for dysuria.  Musculoskeletal: Negative for myalgias and joint swelling.  Skin: Negative for rash.  Allergic/Immunologic: Negative.   Neurological: Negative for weakness, light-headedness and numbness.  Psychiatric/Behavioral: Negative for suicidal ideas and dysphoric mood.    Objective:  BP 141/95 mmHg  Pulse 72  Temp(Src) 98 F (36.7 C)  Resp 16  Ht '5\' 7"'$  (1.702 m)  Wt 233 lb (105.688 kg)  BMI 36.48 kg/m2  SpO2 100%  BP/Weight 10/30/2015 10/22/2015 10/23/5571  Systolic BP 220  185 631  Diastolic BP 95 70 86  Wt. (Lbs) 233 237.8 241.6  BMI 36.48 37.24 37.83      Physical Exam  Constitutional: He is oriented to person, place, and time. He appears well-developed and well-nourished.  Cardiovascular: Normal rate, normal heart sounds and intact distal pulses.   No murmur heard. Pulmonary/Chest: Effort normal and breath sounds normal. He has no wheezes. He has no rales. He exhibits no tenderness.    ICD in place in left upper chest wall  Abdominal: Soft. Bowel sounds are normal. He exhibits no distension and no mass. There is no tenderness.  scars from laparoscopic cholecystectomy healing well  Musculoskeletal: Normal range of motion.  Neurological: He is alert and oriented to person, place, and time.     CMP Latest Ref Rng 10/24/2015 10/11/2015 10/10/2015  Glucose 65 - 99 mg/dL 191(H) 155(H) 191(H)  BUN 6 - 20 mg/dL '18 19 17  '$ Creatinine 0.61 - 1.24 mg/dL 0.91 1.17 1.00  Sodium 135 - 145 mmol/L 138 136 135  Potassium 3.5 - 5.1 mmol/L 4.4 3.9 4.6  Chloride 101 - 111 mmol/L 105 100(L) 103  CO2 22 - 32 mmol/L '24 23 23  '$ Calcium 8.9 - 10.3 mg/dL 9.3 8.4(L) 8.3(L)  Total Protein 6.5 - 8.1 g/dL - 6.0(L) 5.6(L)  Total Bilirubin 0.3 - 1.2 mg/dL - 1.5(H) 1.3(H)  Alkaline Phos 38 - 126 U/L - 50 49  AST 15 - 41 U/L - 36 44(H)  ALT 17 - 63 U/L - 29 32   Lab Results  Component Value Date   HGBA1C 7.3* 10/06/2015    Assessment & Plan:   1. Type 2 diabetes mellitus without complication, with long-term current use of insulin (HCC) Controlled with A1c of 7.3  Continue medications  - Glucose (CBG) - Blood Glucose Monitoring Suppl (TRUE METRIX METER) w/Device KIT; Check blood sugar TID & QHS  Dispense: 1 kit; Refill: 0 - glucose blood (TRUE METRIX BLOOD GLUCOSE TEST) test strip; Use as instructed  Dispense: 100 each; Refill: 12 - TRUEPLUS LANCETS 28G MISC; 1 each by Does not apply route 3 (three) times daily before meals.  Dispense: 100 each; Refill: 12  2. Essential hypertension Mild diastolic elevation due to the fact that he just took his antihypertensive in the exam room Continue low-sodium, DASH diet  3. Chronic systolic congestive heart failure  EF of 20% No evidence of fluid overload Discussed fluid restriction of less than 2 L per day, daily weight   4. S/P ICD (internal cardiac defibrillator)  Keep appointment with cardiology and EP physician   5. Coronary artery disease  involving native coronary artery of native heart without angina  Plans for elective PCI are underway  Meds ordered this encounter  Medications  . Blood Glucose Monitoring Suppl (TRUE METRIX METER) w/Device KIT    Sig: Check blood sugar TID & QHS    Dispense:  1 kit    Refill:  0  . glucose blood (TRUE METRIX BLOOD GLUCOSE TEST) test strip    Sig: Use as instructed    Dispense:  100 each    Refill:  12  . TRUEPLUS LANCETS 28G MISC    Sig: 1 each by Does not apply route 3 (three) times daily before meals.    Dispense:  100 each    Refill:  12    Follow-up: Return in about 1 month (around 11/30/2015) for follow up of DM with PCP- Dr Doreene Burke.   Arnoldo Morale MD

## 2015-10-30 NOTE — Progress Notes (Signed)
Patient here for follow up from the hospital Patient had his gall bladder removed about three weeks ago Patient presents with elevated blood pressure but took his medication When he got in the room

## 2015-11-03 ENCOUNTER — Inpatient Hospital Stay (HOSPITAL_COMMUNITY)
Admission: EM | Admit: 2015-11-03 | Discharge: 2015-11-09 | DRG: 216 | Disposition: A | Discharge status: Home / Self Care | Payer: Medicaid Other | Attending: Cardiology | Admitting: Cardiology

## 2015-11-03 ENCOUNTER — Emergency Department (HOSPITAL_COMMUNITY): Payer: Medicaid Other

## 2015-11-03 ENCOUNTER — Encounter (HOSPITAL_COMMUNITY): Payer: Self-pay | Admitting: *Deleted

## 2015-11-03 DIAGNOSIS — I4892 Unspecified atrial flutter: Secondary | ICD-10-CM | POA: Diagnosis present

## 2015-11-03 DIAGNOSIS — Z888 Allergy status to other drugs, medicaments and biological substances status: Secondary | ICD-10-CM | POA: Diagnosis not present

## 2015-11-03 DIAGNOSIS — I110 Hypertensive heart disease with heart failure: Secondary | ICD-10-CM | POA: Diagnosis present

## 2015-11-03 DIAGNOSIS — Z8249 Family history of ischemic heart disease and other diseases of the circulatory system: Secondary | ICD-10-CM

## 2015-11-03 DIAGNOSIS — I10 Essential (primary) hypertension: Secondary | ICD-10-CM

## 2015-11-03 DIAGNOSIS — Z91018 Allergy to other foods: Secondary | ICD-10-CM | POA: Diagnosis not present

## 2015-11-03 DIAGNOSIS — I255 Ischemic cardiomyopathy: Secondary | ICD-10-CM | POA: Diagnosis present

## 2015-11-03 DIAGNOSIS — K219 Gastro-esophageal reflux disease without esophagitis: Secondary | ICD-10-CM | POA: Diagnosis present

## 2015-11-03 DIAGNOSIS — Z794 Long term (current) use of insulin: Secondary | ICD-10-CM | POA: Diagnosis not present

## 2015-11-03 DIAGNOSIS — I5022 Chronic systolic (congestive) heart failure: Secondary | ICD-10-CM | POA: Diagnosis present

## 2015-11-03 DIAGNOSIS — I214 Non-ST elevation (NSTEMI) myocardial infarction: Principal | ICD-10-CM

## 2015-11-03 DIAGNOSIS — Z7982 Long term (current) use of aspirin: Secondary | ICD-10-CM

## 2015-11-03 DIAGNOSIS — R000 Tachycardia, unspecified: Secondary | ICD-10-CM | POA: Diagnosis present

## 2015-11-03 DIAGNOSIS — Z823 Family history of stroke: Secondary | ICD-10-CM

## 2015-11-03 DIAGNOSIS — I2510 Atherosclerotic heart disease of native coronary artery without angina pectoris: Secondary | ICD-10-CM | POA: Diagnosis present

## 2015-11-03 DIAGNOSIS — R0989 Other specified symptoms and signs involving the circulatory and respiratory systems: Secondary | ICD-10-CM

## 2015-11-03 DIAGNOSIS — I739 Peripheral vascular disease, unspecified: Secondary | ICD-10-CM

## 2015-11-03 DIAGNOSIS — I5023 Acute on chronic systolic (congestive) heart failure: Secondary | ICD-10-CM

## 2015-11-03 DIAGNOSIS — Z79899 Other long term (current) drug therapy: Secondary | ICD-10-CM | POA: Diagnosis not present

## 2015-11-03 DIAGNOSIS — E119 Type 2 diabetes mellitus without complications: Secondary | ICD-10-CM | POA: Diagnosis present

## 2015-11-03 DIAGNOSIS — Z833 Family history of diabetes mellitus: Secondary | ICD-10-CM

## 2015-11-03 DIAGNOSIS — I484 Atypical atrial flutter: Secondary | ICD-10-CM

## 2015-11-03 DIAGNOSIS — Z955 Presence of coronary angioplasty implant and graft: Secondary | ICD-10-CM

## 2015-11-03 DIAGNOSIS — I4510 Unspecified right bundle-branch block: Secondary | ICD-10-CM | POA: Diagnosis present

## 2015-11-03 DIAGNOSIS — E785 Hyperlipidemia, unspecified: Secondary | ICD-10-CM | POA: Diagnosis present

## 2015-11-03 LAB — CBC
HEMATOCRIT: 46.2 % (ref 39.0–52.0)
HEMOGLOBIN: 15.2 g/dL (ref 13.0–17.0)
MCH: 25.2 pg — AB (ref 26.0–34.0)
MCHC: 32.9 g/dL (ref 30.0–36.0)
MCV: 76.7 fL — AB (ref 78.0–100.0)
Platelets: 174 10*3/uL (ref 150–400)
RBC: 6.02 MIL/uL — AB (ref 4.22–5.81)
RDW: 17.3 % — ABNORMAL HIGH (ref 11.5–15.5)
WBC: 7.7 10*3/uL (ref 4.0–10.5)

## 2015-11-03 LAB — BASIC METABOLIC PANEL
ANION GAP: 12 (ref 5–15)
BUN: 19 mg/dL (ref 6–20)
CALCIUM: 9.8 mg/dL (ref 8.9–10.3)
CHLORIDE: 104 mmol/L (ref 101–111)
CO2: 24 mmol/L (ref 22–32)
Creatinine, Ser: 1.29 mg/dL — ABNORMAL HIGH (ref 0.61–1.24)
GFR calc non Af Amer: >60 mL/min (ref >60)
GLUCOSE: 184 mg/dL — AB (ref 65–99)
POTASSIUM: 4.6 mmol/L (ref 3.5–5.1)
Sodium: 140 mmol/L (ref 135–145)

## 2015-11-03 LAB — BRAIN NATRIURETIC PEPTIDE: B NATRIURETIC PEPTIDE 5: 575.9 pg/mL — AB (ref 0.0–100.0)

## 2015-11-03 LAB — TROPONIN I: Troponin I: 0.07 ng/mL — ABNORMAL HIGH (ref <0.031)

## 2015-11-03 LAB — I-STAT TROPONIN, ED: TROPONIN I, POC: 0.04 ng/mL (ref 0.00–0.08)

## 2015-11-03 MED ORDER — AMIODARONE HCL IN DEXTROSE 360-4.14 MG/200ML-% IV SOLN
30.0000 mg/h | INTRAVENOUS | Status: DC
  Administered 2015-11-04 – 2015-11-05 (×4): 30 mg/h via INTRAVENOUS
  Filled 2015-11-03 (×5): qty 200

## 2015-11-03 MED ORDER — AMIODARONE HCL IN DEXTROSE 360-4.14 MG/200ML-% IV SOLN
60.0000 mg/h | INTRAVENOUS | Status: AC
  Administered 2015-11-03 – 2015-11-04 (×2): 60 mg/h via INTRAVENOUS
  Filled 2015-11-03 (×2): qty 200

## 2015-11-03 MED ORDER — SODIUM CHLORIDE 0.9 % IV BOLUS (SEPSIS)
1000.0000 mL | Freq: Once | INTRAVENOUS | Status: AC
  Administered 2015-11-03: 1000 mL via INTRAVENOUS

## 2015-11-03 MED ORDER — ASPIRIN 81 MG PO CHEW
162.0000 mg | CHEWABLE_TABLET | Freq: Once | ORAL | Status: AC
  Administered 2015-11-03: 162 mg via ORAL
  Filled 2015-11-03: qty 2

## 2015-11-03 MED ORDER — AMIODARONE LOAD VIA INFUSION
150.0000 mg | Freq: Once | INTRAVENOUS | Status: AC
  Administered 2015-11-03: 150 mg via INTRAVENOUS
  Filled 2015-11-03: qty 83.34

## 2015-11-03 MED ORDER — FONDAPARINUX SODIUM 2.5 MG/0.5ML ~~LOC~~ SOLN
2.5000 mg | SUBCUTANEOUS | Status: DC
  Administered 2015-11-03: 2.5 mg via SUBCUTANEOUS
  Filled 2015-11-03: qty 0.5

## 2015-11-03 MED ORDER — SODIUM CHLORIDE 0.9 % IV BOLUS (SEPSIS)
500.0000 mL | Freq: Once | INTRAVENOUS | Status: AC
  Administered 2015-11-03: 500 mL via INTRAVENOUS

## 2015-11-03 NOTE — ED Notes (Signed)
Pt here for CP and increased pulse.  Pt checked his BP and found that his BP and pulse were up and he also began feeling CP in central chest with some dizziness and mild sob.  Pt is alert and oriented.  Reports hx of CAD

## 2015-11-03 NOTE — ED Notes (Signed)
Pt in X ray

## 2015-11-03 NOTE — ED Provider Notes (Signed)
CSN: [id number]     Arrival date & time 11/03/15  1934 History   First MD Initiated Contact with Patient 11/03/15 2012     Chief Complaint  Patient presents with  . Chest Pain     (Consider location/radiation/quality/duration/timing/severity/associated sxs/prior Treatment) Patient is a 55 y.o. male presenting with chest pain. The history is provided by the patient and the spouse.  Chest Pain Pain location:  Substernal area Pain quality: pressure   Pain radiates to:  Does not radiate Pain radiates to the back: no   Pain severity:  Moderate Onset quality:  Gradual Duration:  3 hours Timing:  Constant Progression:  Unchanged Chronicity:  Recurrent Relieved by: taking  a deep breath helps pain. Worsened by:  Nothing tried Associated symptoms: weakness   Associated symptoms: no abdominal pain, no back pain, no diaphoresis, no dizziness, no dysphagia, no fever, no headache, no nausea, no palpitations, no shortness of breath and not vomiting   Associated symptoms comment:  Feeling increased pulse Risk factors: coronary artery disease     Past Medical History  Diagnosis Date  . Hypertension   . High cholesterol   . Cholelithiasis   . Chronic systolic heart failure (HCC)   . AICD (automatic cardioverter/defibrillator) present   . Kidney stone   . Coronary artery disease   . Pneumonia 2002  . Type II diabetes mellitus (HCC)   . GERD (gastroesophageal reflux disease)    Past Surgical History  Procedure Laterality Date  . Colonoscopy N/A 07/12/2013    Procedure: COLONOSCOPY;  Surgeon: Shirley Friar, MD;  Location: [facility] ENDOSCOPY;  Service: Endoscopy;  Laterality: N/A;  . Ep implantable device N/A 05/09/2015    Procedure: BiV ICD Insertion CRT-D;  Surgeon: Marinus Maw, MD;  Location: [facility] INVASIVE CV LAB;  Service: Cardiovascular;  Laterality: N/A;  . Cholecystectomy  10/09/2015    Procedure: LAPAROSCOPIC CHOLECYSTECTOMY;  Surgeon: Claud Kelp, MD;  Location: [facility] OR;  Service:  General;;  . Cardiac catheterization N/A 10/07/2015    Procedure: Left Heart Cath and Coronary Angiography;  Surgeon: Lyn Records, MD;  Location: [facility] INVASIVE CV LAB;  Service: Cardiovascular;  Laterality: N/A;   Family History  Problem Relation Age of Onset  . Diabetes Mother   . Hypertension Mother   . Hyperlipidemia Mother   . Stroke Mother   . Diabetes Father   . Hypertension Father   . Stroke Father 22   Social History  Substance Use Topics  . Smoking status: Never Smoker   . Smokeless tobacco: Never Used  . Alcohol Use: No    Review of Systems  Constitutional: Negative for fever, diaphoresis, activity change and appetite change.  HENT: Negative for facial swelling, sore throat, tinnitus, trouble swallowing and voice change.   Eyes: Negative for pain, redness and visual disturbance.  Respiratory: Negative for chest tightness, shortness of breath and wheezing.   Cardiovascular: Positive for chest pain. Negative for palpitations and leg swelling.  Gastrointestinal: Negative for nausea, vomiting, abdominal pain, diarrhea, constipation and abdominal distention.  Endocrine: Negative.   Genitourinary: Negative.  Negative for dysuria, decreased urine volume, scrotal swelling and testicular pain.  Musculoskeletal: Negative for myalgias, back pain and gait problem.  Skin: Negative.  Negative for rash.  Neurological: Positive for weakness. Negative for dizziness, tremors and headaches.  Psychiatric/Behavioral: Negative for suicidal ideas, hallucinations and self-injury. The patient is not nervous/anxious.       Allergies  Heparin; Lovenox; and Pork-derived products  Home Medications   Prior  to Admission medications   Medication Sig Start Date End Date Taking? Authorizing Provider  aspirin EC 81 MG tablet Take 81 mg by mouth daily.   Yes Historical Provider, MD  carvedilol (COREG) 25 MG tablet Take 25 mg by mouth 2 (two) times daily with a meal.    Yes Historical Provider, MD   digoxin (LANOXIN) 0.125 MG tablet Take 1 tablet (0.125 mg total) by mouth daily. 10/11/15  Yes Shanker Levora Dredge, MD  furosemide (LASIX) 20 MG tablet Take 20 mg by mouth 2 (two) times daily.   Yes Historical Provider, MD  insulin aspart (NOVOLOG FLEXPEN) 100 UNIT/ML FlexPen Inject 10 Units into the skin 2 (two) times daily with a meal.   Yes Historical Provider, MD  insulin glargine (LANTUS) 100 UNIT/ML injection Inject 30 Units into the skin at bedtime.    Yes Historical Provider, MD  metFORMIN (GLUCOPHAGE) 1000 MG tablet Take 1,000 mg by mouth daily with breakfast.   Yes Historical Provider, MD  olmesartan (BENICAR) 20 MG tablet Take 1 tablet (20 mg total) by mouth daily. 10/16/15  Yes Amy D Clegg, NP  pregabalin (LYRICA) 75 MG capsule Take 150 mg by mouth at bedtime.   Yes Historical Provider, MD  rosuvastatin (CRESTOR) 20 MG tablet Take 1 tablet (20 mg total) by mouth daily. 08/22/15  Yes Olugbemiga E Hyman Hopes, MD   BP 133/97 mmHg  Pulse 130  Resp 24  Ht 5\' 7"  (1.702 m)  Wt 105.507 kg  BMI 36.42 kg/m2  SpO2 98% Physical Exam  Constitutional: He is oriented to person, place, and time. He appears well-developed and well-nourished. No distress.  HENT:  Head: Normocephalic and atraumatic.  Right Ear: External ear normal.  Left Ear: External ear normal.  Nose: Nose normal.  Mouth/Throat: Oropharynx is clear and moist.  Eyes: Conjunctivae and EOM are normal. Pupils are equal, round, and reactive to light. No scleral icterus.  Neck: Normal range of motion. Neck supple. JVD present. No tracheal deviation present. No thyromegaly present.  Cardiovascular: Intact distal pulses.   Regular tachycardia  Pulmonary/Chest: Effort normal and breath sounds normal. No stridor. No respiratory distress. He has no wheezes. He has no rales.  Abdominal: Soft. He exhibits no distension. There is no tenderness. There is no rebound and no guarding.  Musculoskeletal: Normal range of motion. He exhibits no  tenderness.  Neurological: He is alert and oriented to person, place, and time. No cranial nerve deficit. He exhibits normal muscle tone. Coordination normal.  Skin: Skin is warm and dry. No rash noted. He is not diaphoretic.  Psychiatric: He has a normal mood and affect. His behavior is normal.  Nursing note and vitals reviewed.   ED Course  Procedures (including critical care time) Labs Review Labs Reviewed  BASIC METABOLIC PANEL - Abnormal; Notable for the following:    Glucose, Bld 184 (*)    Creatinine, Ser 1.29 (*)    All other components within normal limits  CBC - Abnormal; Notable for the following:    RBC 6.02 (*)    MCV 76.7 (*)    MCH 25.2 (*)    RDW 17.3 (*)    All other components within normal limits  TROPONIN I - Abnormal; Notable for the following:    Troponin I 0.07 (*)    All other components within normal limits  BRAIN NATRIURETIC PEPTIDE - Abnormal; Notable for the following:    B Natriuretic Peptide 575.9 (*)    All other components within normal limits  Rosezena Sensor, ED    Imaging Review Dg Chest 2 View  11/03/2015  CLINICAL DATA:  Chest pain, hypertension, tachycardia. EXAM: CHEST  2 VIEW COMPARISON:  10/07/2015 FINDINGS: There is unchanged moderate cardiomegaly. The transvenous biventricular pacing leads appear grossly intact. The right upper extremity PICC line has been removed. The lungs are clear. There is no pleural effusion. The pulmonary vasculature is normal. Hilar and mediastinal contours are unremarkable and unchanged. IMPRESSION: Cardiomegaly.  No acute cardiopulmonary findings. Electronically Signed   By: Ellery Plunk M.D.   On: 11/03/2015 20:26   I have personally reviewed and evaluated these images and lab results as part of my medical decision-making.   EKG Interpretation   Date/Time:  Monday November 03 2015 19:43:18 EDT Ventricular Rate:  127 PR Interval:  112 QRS Duration: 132 QT Interval:  368 QTC Calculation: 534 R Axis:    171 Text Interpretation:  Suspect arm lead reversal, interpretation  assumes no reversal Sinus tachycardia Right bundle branch block Septal  infarct , age undetermined Lateral infarct , age undetermined Abnormal ECG  paced rhytyhm not seen diffuse abnormalities Confirmed by MILLER  MD,  BRIAN ([id number]) on 11/03/2015 9:11:09 PM      MDM   Final diagnoses:  NSTEMI (non-ST elevated myocardial infarction) [facility])    The patient is a 55 year old male with a history of coronary artery disease. Recent cath showing triple vessel disease. He presents for 3 hours of substernal pressure chest pain. EKG shows sinus tachycardia versus a flutter with tachycardia. Patient remains symptomatically stable with normal mental status. Patient given 162mg  asa as he already has had 162mg  previously today. Troponin is elevated to 0.07. Cardiology consultation will patient to their service. Patient is given amiodarone and heparin per cardiology recommendations. Patient admitted for further treatment.  Patient seen with attending, Dr. Hyacinth Meeker, who oversaw clinical decision making.     Lula Olszewski, MD 11/03/15 [id number]  Eber Hong, MD 11/04/15 [phone]

## 2015-11-03 NOTE — ED Provider Notes (Signed)
The patient is a 55 year old male, he has a known history of severe coronary disease, this is triple vessel, he has had recent evaluations of his coronary anatomy and is known to have severe disease, overall not amenable to intervention. He presents today with ongoing chest discomfort, he is having some shortness of breath with it, it is a heaviness or pressure in the left chest. On exam the patient has a tachycardia, is regular, there is no peripheral edema, his lungs are overall clear and he does not appear in distress. His EKG is abnormal, overall not terribly different but does have a rapid rate. He has an elevated troponin, this has been discussed with the cardiologist who will see the patient and admit, have requested anticoagulants, the patient is critically ill with what appears to be a non-ST elevation MI   EKG Interpretation  Date/Time:  Monday November 03 2015 19:43:18 EDT Ventricular Rate:  127 PR Interval:  112 QRS Duration: 132 QT Interval:  368 QTC Calculation: 534 R Axis:   171 Text Interpretation:   Suspect arm lead reversal, interpretation assumes no reversal Sinus tachycardia Right bundle branch block Septal infarct , age undetermined Lateral infarct , age undetermined Abnormal ECG paced rhytyhm not seen diffuse abnormalities Confirmed by Shantanu Strauch  MD, Zidan Helget ([id number]) on 11/03/2015 9:11:09 PM       CRITICAL CARE Performed by: Eber Hong D Total critical care time: 35 minutes Critical care time was exclusive of separately billable procedures and treating other patients. Critical care was necessary to treat or prevent imminent or life-threatening deterioration. Critical care was time spent personally by me on the following activities: development of treatment plan with patient and/or surrogate as well as nursing, discussions with consultants, evaluation of patient's response to treatment, examination of patient, obtaining history from patient or surrogate, ordering and performing  treatments and interventions, ordering and review of laboratory studies, ordering and review of radiographic studies, pulse oximetry and re-evaluation of patient's condition.  I saw and evaluated the patient, reviewed the resident's note and I agree with the findings and plan.    Final diagnoses:  NSTEMI (non-ST elevated myocardial infarction) (HCC)      Eber Hong, MD 11/04/15 1331

## 2015-11-03 NOTE — H&P (Signed)
Physician History and Physical    Benjamin [name] MRN: [id number] DOB/AGE: 1961-03-22 55 y.o. Admit date: 11/03/2015  Primary Cardiologist: Dr. Ladona Ridgel  HPI: 55 yo with history of diffuse CAD, HTN, chronic systolic CHF/ischemic cardiomyopathy, and diabetes presented to ER with tachycardia and chest pressure.   Admitted in early March with abdominal pain and increased SOB with exertion. Cardiology consulted for surgical clearance. Due to many risk factors he was sent for [facility]. LHC showed complex, calcified, multivessel coronary disease with high-grade obstruction in the mid LAD and distal RCA. He had laparoscopic cholecystectomy. Saw Dr Katrinka Blazing for interventional consideration, decided to get thallium viability study.  This showed large lateral/inferolateral infarct base-apex and apical infarct.  Decision has not yet been made on CHIP intervention on LAD and RCA.  At baseline, patient is short of breath after walking 100-200 feet and does not get chest pain.  Today around lunchtime, he felt his heart start racing.  Pulse was in the 130s.  He developed central chest pressure.  No chest pain that he can remember prior to today.  HR continued to be high and he continued to have chest pressure, so came to the ER. In the ER, HR up to 130s.  He was noted to be in what appeared to be atypical atrial flutter.  He was started on amiodarone gtt and HR decreased to the 80s-90s.  Chest pressure improved but did not totally resolve.  TnI was mildly elevated at 0.07.   PMH: 1. Chronic systolic CHF: Ischemic cardiomyopathy. - St Jude BiV ICD 10/16.  - Echo (8/16) with EF 20%, diffuse hypokinesis, mild MR.  2. HTN 3. Type II diabetes 4. Hyperlipidemia 5. CAD: LHC (3/17) with 90% distal RCA, 90% PDA, 100% D1, 95% mLAD, 90% dLAD, OM2 95%, ostial LCx 60%, mid LCx 60%.  Diffuse distal disease, thought to be poor CABG candidate.  Saw Dr. Katrinka Blazing for consideration of CHIP LAD and RCA. - Thallium viability scan (3/17): Large  lateral and inferolateral infarct base-apex, apical infarct.  6. GERD 7. Laparoscopic cholecystectomy in 3/17.  Review of systems complete and found to be negative unless listed above   Family History  Problem Relation Age of Onset  . Diabetes Mother   . Hypertension Mother   . Hyperlipidemia Mother   . Stroke Mother   . Diabetes Father   . Hypertension Father   . Stroke Father 54    Social History   Social History  . Marital Status: Married    Spouse Name: N/A  . Number of Children: N/A  . Years of Education: N/A   Occupational History  . Not on file.   Social History Main Topics  . Smoking status: Never Smoker   . Smokeless tobacco: Never Used  . Alcohol Use: No  . Drug Use: No  . Sexual Activity: No   Other Topics Concern  . Not on file   Social History Narrative    Current Facility-Administered Medications  Medication Dose Route Frequency Provider Last Rate Last Dose  . amiodarone (NEXTERONE) 1.8 mg/mL load via infusion 150 mg  150 mg Intravenous Once Lula Olszewski, MD   150 mg at 11/03/15 2240   Followed by  . amiodarone (NEXTERONE PREMIX) 360 MG/200ML (1.8 mg/mL) IV infusion  60 mg/hr Intravenous Continuous Lula Olszewski, MD 33.3 mL/hr at 11/03/15 2238 60 mg/hr at 11/03/15 2238   Followed by  . [START ON 11/04/2015] amiodarone (NEXTERONE PREMIX) 360 MG/200ML (1.8 mg/mL) IV infusion  30  mg/hr Intravenous Continuous Lula Olszewski, MD      . fondaparinux (ARIXTRA) injection 2.5 mg  2.5 mg Subcutaneous Q24H Eber Hong, MD       Current Outpatient Prescriptions  Medication Sig Dispense Refill  . aspirin EC 81 MG tablet Take 81 mg by mouth daily.    . carvedilol (COREG) 25 MG tablet Take 25 mg by mouth 2 (two) times daily with a meal.     . digoxin (LANOXIN) 0.125 MG tablet Take 1 tablet (0.125 mg total) by mouth daily. 30 tablet 0  . furosemide (LASIX) 20 MG tablet Take 20 mg by mouth 2 (two) times daily.    . insulin aspart (NOVOLOG FLEXPEN) 100 UNIT/ML FlexPen  Inject 10 Units into the skin 2 (two) times daily with a meal.    . insulin glargine (LANTUS) 100 UNIT/ML injection Inject 30 Units into the skin at bedtime.     . metFORMIN (GLUCOPHAGE) 1000 MG tablet Take 1,000 mg by mouth daily with breakfast.    . olmesartan (BENICAR) 20 MG tablet Take 1 tablet (20 mg total) by mouth daily. 30 tablet 6  . pregabalin (LYRICA) 75 MG capsule Take 150 mg by mouth at bedtime.    . rosuvastatin (CRESTOR) 20 MG tablet Take 1 tablet (20 mg total) by mouth daily. 90 tablet 3    Physical Exam: Blood pressure 133/97, pulse 130, resp. rate 24, height  (1.702 m), weight 232 lb 9.6 oz (105.507 kg), SpO2 98 %.  General: NAD Neck: JVP 8-9 cm, no thyromegaly or thyroid nodule.  Lungs: Clear to auscultation bilaterally with normal respiratory effort. CV: Nondisplaced PMI.  Heart tachy, regular S1/S2, no S3/S4, no murmur.  No peripheral edema.  No carotid bruit.  Normal pedal pulses.  Abdomen: Soft, nontender, no hepatosplenomegaly, no distention.  Skin: Intact without lesions or rashes.  Neurologic: Alert and oriented x 3.  Psych: Normal affect. Extremities: No clubbing or cyanosis.  HEENT: Normal.   Labs:   Lab Results  Component Value Date   WBC 7.7 11/03/2015   HGB 15.2 11/03/2015   HCT 46.2 11/03/2015   MCV 76.7* 11/03/2015   PLT 174 11/03/2015    Recent Labs Lab 11/03/15 2001  NA 140  K 4.6  CL 104  CO2 24  BUN 19  CREATININE 1.29*  CALCIUM 9.8  GLUCOSE 184*   Lab Results  Component Value Date   TROPONINI 0.07* 11/03/2015  BNP 576  Radiology: - CXR: Cardiomegaly, otherwise nothing acute.  EKG: tachycardic at 127, suspect atypical atrial flutter with poor RWP.   ASSESSMENT AND PLAN: 55 yo with history of diffuse CAD, HTN, chronic systolic CHF/ischemic cardiomyopathy, and diabetes presented to ER with tachycardia and chest pressure.  He was noted to be in atypical atrial flutter and TnI was mildly elevated at 0.07.  1. Atrial flutter:  New onset, around noon on 4/3.  Appears atypical.  HR slowing with amiodarone gtt, now in the 80s-90s.   - Continue amiodarone gtt for rate control.  - Needs anticoagulation.  Cannot get pork products for religious reasons so heparin and Lovenox are not options.  He got a dose of fondaparinux in the ER.  I will stop this and have pharmacy start bivalirudin at an appropriate interval.  - Anticoagulation begun after < 24 hours atrial flutter (clear onset).  He should have cardioversion, this can be done without TEE.  Question at this point will be timing => does he get cardiac cath prior to cardioversion?  2. CAD: Severe 3VD on recent cath (3/17).  Diffuse distal vessel disease so not good CABG candidate.  Has been seen by Dr Katrinka Blazing for consideration of CHIP procedure to LAD and RCA. Recent thallium viability study showed lateral/inferolateral scar and apical scar. He had been awaiting decision on +/- PCI.  Currently, having chest tightness in setting of atrial flutter with RVR.  TnI so far mildly elevated.  I think that this is likely demand ischemia in the setting of tachycardia and severe diffuse coronary disease.  - Cycle troponin to peak. - Anticoagulation as above. - Continue ASA and statin.  - Would have interventionalist review films in am, ?attempt at PCI this admission.  Would need to decide on timing of PCI around possible need for DCCV.  3. Acute on chronic systolic CHF: Ischemic cardiomyopathy. EF 20% on last echo.  St Jude CRT-D.  Probably mild volume overload on exam.   - Continue home Coreg and digoxin, check digoxin level. - Will use losartan 25 mg bid rather than olmesartan (not formulary). - Lasix 40 mg IV bid for now.  4. HTN: BP running high.  Per patient, has had hypotension with NTG in the past, told never to take.  Therefore, will hold off on NTG gtt.  Will give his home Coreg and losartan to replace olmesartan.   Signed: Marca Ancona 11/03/2015, 10:46 PM

## 2015-11-04 ENCOUNTER — Inpatient Hospital Stay (HOSPITAL_COMMUNITY): Payer: Medicaid Other

## 2015-11-04 ENCOUNTER — Encounter (HOSPITAL_COMMUNITY): Payer: Self-pay | Admitting: Physician Assistant

## 2015-11-04 DIAGNOSIS — I25110 Atherosclerotic heart disease of native coronary artery with unstable angina pectoris: Secondary | ICD-10-CM

## 2015-11-04 DIAGNOSIS — I739 Peripheral vascular disease, unspecified: Secondary | ICD-10-CM

## 2015-11-04 DIAGNOSIS — I4892 Unspecified atrial flutter: Secondary | ICD-10-CM

## 2015-11-04 DIAGNOSIS — I255 Ischemic cardiomyopathy: Secondary | ICD-10-CM

## 2015-11-04 DIAGNOSIS — Z95810 Presence of automatic (implantable) cardiac defibrillator: Secondary | ICD-10-CM

## 2015-11-04 LAB — BASIC METABOLIC PANEL
Anion gap: 11 (ref 5–15)
BUN: 18 mg/dL (ref 6–20)
CHLORIDE: 106 mmol/L (ref 101–111)
CO2: 24 mmol/L (ref 22–32)
CREATININE: 1.07 mg/dL (ref 0.61–1.24)
Calcium: 9.2 mg/dL (ref 8.9–10.3)
GFR calc Af Amer: >60 mL/min (ref >60)
GFR calc non Af Amer: >60 mL/min (ref >60)
GLUCOSE: 152 mg/dL — AB (ref 65–99)
Potassium: 3.9 mmol/L (ref 3.5–5.1)
SODIUM: 141 mmol/L (ref 135–145)

## 2015-11-04 LAB — CBC
HCT: 44.6 % (ref 39.0–52.0)
HEMOGLOBIN: 14.1 g/dL (ref 13.0–17.0)
MCH: 24.2 pg — ABNORMAL LOW (ref 26.0–34.0)
MCHC: 31.6 g/dL (ref 30.0–36.0)
MCV: 76.5 fL — ABNORMAL LOW (ref 78.0–100.0)
PLATELETS: 171 10*3/uL (ref 150–400)
RBC: 5.83 MIL/uL — AB (ref 4.22–5.81)
RDW: 17.5 % — ABNORMAL HIGH (ref 11.5–15.5)
WBC: 7.6 10*3/uL (ref 4.0–10.5)

## 2015-11-04 LAB — TROPONIN I
TROPONIN I: 0.72 ng/mL — AB (ref <0.031)
Troponin I: 1.88 ng/mL (ref <0.031)
Troponin I: 1.86 ng/mL (ref <0.031)

## 2015-11-04 LAB — APTT
aPTT: 71 seconds — ABNORMAL HIGH (ref 24–37)
aPTT: 71 seconds — ABNORMAL HIGH (ref 24–37)

## 2015-11-04 LAB — GLUCOSE, CAPILLARY
GLUCOSE-CAPILLARY: 150 mg/dL — AB (ref 65–99)
GLUCOSE-CAPILLARY: 146 mg/dL — AB (ref 65–99)
GLUCOSE-CAPILLARY: 170 mg/dL — AB (ref 65–99)
Glucose-Capillary: 154 mg/dL — ABNORMAL HIGH (ref 65–99)
Glucose-Capillary: 208 mg/dL — ABNORMAL HIGH (ref 65–99)

## 2015-11-04 LAB — LIPID PANEL
CHOLESTEROL: 105 mg/dL (ref 0–200)
HDL: 29 mg/dL — ABNORMAL LOW (ref >40)
LDL CALC: 57 mg/dL (ref 0–99)
Total CHOL/HDL Ratio: 3.6 RATIO
Triglycerides: 97 mg/dL (ref <150)
VLDL: 19 mg/dL (ref 0–40)

## 2015-11-04 LAB — TSH: TSH: 1.154 u[IU]/mL (ref 0.350–4.500)

## 2015-11-04 LAB — MRSA PCR SCREENING: MRSA BY PCR: NEGATIVE

## 2015-11-04 LAB — DIGOXIN LEVEL: DIGOXIN LVL: 0.3 ng/mL — AB (ref 0.8–2.0)

## 2015-11-04 MED ORDER — SODIUM CHLORIDE 0.9 % IV SOLN
INTRAVENOUS | Status: DC
  Administered 2015-11-04: 22:00:00 via INTRAVENOUS

## 2015-11-04 MED ORDER — CLOPIDOGREL BISULFATE 75 MG PO TABS
300.0000 mg | ORAL_TABLET | Freq: Once | ORAL | Status: AC
  Administered 2015-11-04: 300 mg via ORAL
  Filled 2015-11-04: qty 4

## 2015-11-04 MED ORDER — SODIUM CHLORIDE 0.9% FLUSH: 3.0000 mL | INTRAVENOUS | Status: DC | PRN

## 2015-11-04 MED ORDER — SODIUM CHLORIDE 0.9 % IV SOLN: 250.0000 mL | INTRAVENOUS | Status: DC

## 2015-11-04 MED ORDER — ACETAMINOPHEN 325 MG PO TABS: 650.0000 mg | ORAL_TABLET | ORAL | Status: DC | PRN

## 2015-11-04 MED ORDER — INSULIN GLARGINE 100 UNIT/ML ~~LOC~~ SOLN
30.0000 [IU] | Freq: Every day | SUBCUTANEOUS | Status: DC
  Administered 2015-11-04: 15 [IU] via SUBCUTANEOUS
  Administered 2015-11-05: 20 [IU] via SUBCUTANEOUS
  Administered 2015-11-06: 10 [IU] via SUBCUTANEOUS
  Administered 2015-11-07 – 2015-11-08 (×2): 20 [IU] via SUBCUTANEOUS
  Filled 2015-11-04 (×8): qty 0.3

## 2015-11-04 MED ORDER — ASPIRIN EC 81 MG PO TBEC: 81.0000 mg | DELAYED_RELEASE_TABLET | Freq: Every day | ORAL | Status: DC

## 2015-11-04 MED ORDER — ASPIRIN 81 MG PO CHEW: 324.0000 mg | CHEWABLE_TABLET | ORAL | Status: DC

## 2015-11-04 MED ORDER — SODIUM CHLORIDE 0.9 % IV SOLN: 250.0000 mL | INTRAVENOUS | Status: DC | PRN

## 2015-11-04 MED ORDER — INSULIN ASPART 100 UNIT/ML ~~LOC~~ SOLN: 0.0000 [IU] | Freq: Every day | SUBCUTANEOUS | Status: DC

## 2015-11-04 MED ORDER — CARVEDILOL 25 MG PO TABS
25.0000 mg | ORAL_TABLET | Freq: Two times a day (BID) | ORAL | Status: DC
  Administered 2015-11-04 – 2015-11-06 (×5): 25 mg via ORAL
  Filled 2015-11-04 (×5): qty 1

## 2015-11-04 MED ORDER — SODIUM CHLORIDE 0.9% FLUSH
3.0000 mL | Freq: Two times a day (BID) | INTRAVENOUS | Status: DC
  Administered 2015-11-06 – 2015-11-08 (×5): 3 mL via INTRAVENOUS

## 2015-11-04 MED ORDER — ASPIRIN EC 81 MG PO TBEC
81.0000 mg | DELAYED_RELEASE_TABLET | Freq: Every day | ORAL | Status: DC
  Administered 2015-11-04: 81 mg via ORAL
  Filled 2015-11-04: qty 1

## 2015-11-04 MED ORDER — SODIUM CHLORIDE 0.9% FLUSH: 3.0000 mL | Freq: Two times a day (BID) | INTRAVENOUS | Status: DC

## 2015-11-04 MED ORDER — ASPIRIN 81 MG PO CHEW
81.0000 mg | CHEWABLE_TABLET | ORAL | Status: AC
  Administered 2015-11-05: 81 mg via ORAL
  Filled 2015-11-04: qty 1

## 2015-11-04 MED ORDER — INSULIN ASPART 100 UNIT/ML ~~LOC~~ SOLN
0.0000 [IU] | Freq: Three times a day (TID) | SUBCUTANEOUS | Status: DC
  Administered 2015-11-04: 3 [IU] via SUBCUTANEOUS
  Administered 2015-11-04 – 2015-11-05 (×2): 2 [IU] via SUBCUTANEOUS
  Administered 2015-11-07: 5 [IU] via SUBCUTANEOUS
  Administered 2015-11-07 – 2015-11-09 (×3): 2 [IU] via SUBCUTANEOUS

## 2015-11-04 MED ORDER — ROSUVASTATIN CALCIUM 10 MG PO TABS
20.0000 mg | ORAL_TABLET | Freq: Every day | ORAL | Status: DC
  Administered 2015-11-04 – 2015-11-09 (×5): 20 mg via ORAL
  Filled 2015-11-04: qty 2
  Filled 2015-11-04 (×2): qty 1
  Filled 2015-11-04 (×2): qty 2

## 2015-11-04 MED ORDER — INSULIN ASPART 100 UNIT/ML ~~LOC~~ SOLN
10.0000 [IU] | Freq: Two times a day (BID) | SUBCUTANEOUS | Status: DC
  Administered 2015-11-04: 10 [IU] via SUBCUTANEOUS
  Administered 2015-11-05: 4 [IU] via SUBCUTANEOUS
  Administered 2015-11-06: 5 [IU] via SUBCUTANEOUS
  Administered 2015-11-06 – 2015-11-08 (×2): 10 [IU] via SUBCUTANEOUS

## 2015-11-04 MED ORDER — FUROSEMIDE 10 MG/ML IJ SOLN
40.0000 mg | Freq: Two times a day (BID) | INTRAMUSCULAR | Status: DC
  Administered 2015-11-04 – 2015-11-09 (×10): 40 mg via INTRAVENOUS
  Filled 2015-11-04 (×10): qty 4

## 2015-11-04 MED ORDER — SODIUM CHLORIDE 0.9 % IV SOLN
0.2500 mg/kg/h | INTRAVENOUS | Status: DC
  Administered 2015-11-04 – 2015-11-05 (×4): 0.25 mg/kg/h via INTRAVENOUS
  Filled 2015-11-04 (×4): qty 250

## 2015-11-04 MED ORDER — ASPIRIN 300 MG RE SUPP: 300.0000 mg | RECTAL | Status: DC

## 2015-11-04 MED ORDER — OFF THE BEAT BOOK
Freq: Once | Status: AC
  Administered 2015-11-04: 02:00:00
  Filled 2015-11-04: qty 1

## 2015-11-04 MED ORDER — LOSARTAN POTASSIUM 25 MG PO TABS
25.0000 mg | ORAL_TABLET | Freq: Two times a day (BID) | ORAL | Status: DC
  Administered 2015-11-04 – 2015-11-09 (×10): 25 mg via ORAL
  Filled 2015-11-04 (×10): qty 1

## 2015-11-04 MED ORDER — ONDANSETRON HCL 4 MG/2ML IJ SOLN: 4.0000 mg | Freq: Four times a day (QID) | INTRAMUSCULAR | Status: DC | PRN

## 2015-11-04 MED ORDER — PREGABALIN 50 MG PO CAPS
150.0000 mg | ORAL_CAPSULE | Freq: Every day | ORAL | Status: DC
  Administered 2015-11-04 – 2015-11-09 (×7): 150 mg via ORAL
  Filled 2015-11-04 (×7): qty 3

## 2015-11-04 MED ORDER — CLOPIDOGREL BISULFATE 75 MG PO TABS
75.0000 mg | ORAL_TABLET | Freq: Every day | ORAL | Status: DC
  Administered 2015-11-05: 75 mg via ORAL
  Filled 2015-11-04: qty 1

## 2015-11-04 MED ORDER — DIGOXIN 125 MCG PO TABS
0.1250 mg | ORAL_TABLET | Freq: Every day | ORAL | Status: DC
  Administered 2015-11-04 – 2015-11-09 (×5): 0.125 mg via ORAL
  Filled 2015-11-04 (×5): qty 1

## 2015-11-04 MED ORDER — NITROGLYCERIN 0.4 MG SL SUBL: 0.4000 mg | SUBLINGUAL_TABLET | SUBLINGUAL | Status: DC | PRN

## 2015-11-04 NOTE — Consult Note (Addendum)
INTERVENTIONAL CARDIOLOGY CONSULT   Mr. Tabet is well known to me. He has severe ischemic cardiomyopathy, severe diffuse 3 vessel CAD, focal high grade calcified mid LAD and diffuse mid to distal RCA. Viability nuclear thallium study interpreted by [facility] and discussed with him, reveals small amount of anteroapical viability and inferobasal and septal viability. The entire lateral wall is infacrcted.  LVEF by multiple methods is ~15%.  He presents at this time with A flutter and HR 130 bpm with ongoing angina. Troponin 1.86. A flutter treated with internal defibrillator and now back in NSR and very comfortable.  We discussed CHIP (Complex, High-risk, Indicated, PCI) with patient and also at cath conference 3 weeks ago. The decision was made to proceed with using Impella LVAD hemodynamic support. LAD will need rotational atherectomy. RCA will need stenting and we may have trouble delivering the stent. The patient was counseled to undergo left heart catheterization, coronary angiography, and complex, high risk. indicated percutaneous coronary intervention with stent implantation in RCA and LAD. Rotational atherectomy will be required in the LAD. The procedural risks and benefits were discussed in detail. The risks discussed included death, stroke, myocardial infarction, life-threatening bleeding, limb ischemia, kidney injury/failure, allergy, and other. He is not an emergency surgery candidate.The risk of these significant complications were estimated to be 5-8 times higher than usual, i.e. 8-12%. After discussion, the patient agrees to proceed. Family has not been present as previous, but he states they will be here in AM. Also states he will discuss the procedure and high risk with them. Mild hydration over night. Case scheduled for 7:30 AM. Time spent with patient today concerning this procedure 45 mins.

## 2015-11-04 NOTE — Progress Notes (Signed)
ANTICOAGULATION CONSULT NOTE - Initial Consult  Pharmacy Consult for Bivalirudin Indication: ACS  Allergies  Allergen Reactions  . Heparin Other (See Comments)    Will not use pork-derived products due to religious beliefs   . Lovenox [Enoxaparin Sodium] Other (See Comments)    Will not use pork-derived products due to religious beliefs   . Pork-Derived Products     Does not eat pork or pork derived products d/t religious reasons    Patient Measurements: Height: 5\' 7"  (170.2 cm) Weight: 233 lb 8 oz (105.915 kg) IBW/kg (Calculated) : 66.1  Vital Signs: Temp: 98.8 F (37.1 C) (04/04 0035) BP: 116/78 mmHg (04/04 0110) Pulse Rate: 68 (04/04 0110)  Labs:  Recent Labs  11/03/15 2001 11/03/15 2101  HGB 15.2  --   HCT 46.2  --   PLT 174  --   CREATININE 1.29*  --   TROPONINI  --  0.07*    Estimated Creatinine Clearance: 76.8 mL/min (by C-G formula based on Cr of 1.29).   Medical History: Past Medical History  Diagnosis Date  . Hypertension   . High cholesterol   . Cholelithiasis   . Chronic systolic heart failure (HCC)   . AICD (automatic cardioverter/defibrillator) present   . Kidney stone   . Coronary artery disease   . Pneumonia 2002  . Type II diabetes mellitus (HCC)   . GERD (gastroesophageal reflux disease)     Medications:  Prescriptions prior to admission  Medication Sig Dispense Refill Last Dose  . aspirin EC 81 MG tablet Take 81 mg by mouth daily.   11/03/2015 at Unknown time  . carvedilol (COREG) 25 MG tablet Take 25 mg by mouth 2 (two) times daily with a meal.    11/03/2015 at 530pm   . digoxin (LANOXIN) 0.125 MG tablet Take 1 tablet (0.125 mg total) by mouth daily. 30 tablet 0 11/03/2015 at Unknown time  . furosemide (LASIX) 20 MG tablet Take 20 mg by mouth 2 (two) times daily.   11/03/2015 at Unknown time  . insulin aspart (NOVOLOG FLEXPEN) 100 UNIT/ML FlexPen Inject 10 Units into the skin 2 (two) times daily with a meal.   11/03/2015 at Unknown time  .  insulin glargine (LANTUS) 100 UNIT/ML injection Inject 30 Units into the skin at bedtime.    11/02/2015 at Unknown time  . metFORMIN (GLUCOPHAGE) 1000 MG tablet Take 1,000 mg by mouth daily with breakfast.   11/03/2015 at Unknown time  . olmesartan (BENICAR) 20 MG tablet Take 1 tablet (20 mg total) by mouth daily. 30 tablet 6 11/03/2015 at Unknown time  . pregabalin (LYRICA) 75 MG capsule Take 150 mg by mouth at bedtime.   11/02/2015 at Unknown time  . rosuvastatin (CRESTOR) 20 MG tablet Take 1 tablet (20 mg total) by mouth daily. 90 tablet 3 11/03/2015 at Unknown time    Assessment: 55 y.o. male with Afib/ACS for anticoagulation.  Received Atrixtra 2.5 mg SQ in ED at 2315 for ACS.  Due to pt's size and Afib indication, will start bivalirudin 6 hours after fondaparinux given.    Goal of Therapy:  PTT 50-85 Monitor platelets by anticoagulation protocol: Yes   Plan:  Start bivalirudin 0.25 mg/kg/hr at 0500 aPTT at 0800  Eddie Candle 11/04/2015,2:23 AM

## 2015-11-04 NOTE — Progress Notes (Signed)
VASCULAR LAB PRELIMINARY  PRELIMINARY  PRELIMINARY  PRELIMINARY  Bilateral lower extremity arterial duplex  completed.    Preliminary report:  Bilateral - Mild heterogeneous plaque noted throughout the lower extremity with no obvious evidence of significant stenosis. All Doppler waveforms were triphasic bilaterally except the left pedal arteries which were biphasic  Aisea Bouldin, RVS 11/04/2015, 7:31 PM

## 2015-11-04 NOTE — Progress Notes (Addendum)
Addendum: APTT therapeutic at 71 x 2, will continue current rate and plan for daily aPTT while on bivalirudin. F/u after cath for change to oral anticoagulation.    ANTICOAGULATION CONSULT NOTE  Pharmacy Consult for Bivalirudin Indication: ACS  Allergies  Allergen Reactions  . Heparin Other (See Comments)    Will not use pork-derived products due to religious beliefs   . Lovenox [Enoxaparin Sodium] Other (See Comments)    Will not use pork-derived products due to religious beliefs   . Pork-Derived Products     Does not eat pork or pork derived products d/t religious reasons    Patient Measurements: Height: 5\' 7"  (170.2 cm) Weight: 233 lb 8 oz (105.915 kg) IBW/kg (Calculated) : 66.1  Vital Signs: Temp: 98 F (36.7 C) (04/04 0816) Temp Source: Oral (04/04 0816) BP: 118/88 mmHg (04/04 0731) Pulse Rate: 78 (04/04 0357)  Labs:  Recent Labs  11/03/15 2001 11/03/15 2101 11/04/15 0208 11/04/15 0728  HGB 15.2  --   --  14.1  HCT 46.2  --   --  44.6  PLT 174  --   --  171  APTT  --   --   --  71*  CREATININE 1.29*  --   --  1.07  TROPONINI  --  0.07* 0.72* 1.88*    Estimated Creatinine Clearance: 92.6 mL/min (by C-G formula based on Cr of 1.07).   Medical History: Past Medical History  Diagnosis Date  . Hypertension   . High cholesterol   . Cholelithiasis   . Chronic systolic heart failure (HCC)   . AICD (automatic cardioverter/defibrillator) present   . Kidney stone   . Pneumonia 2002  . Type II diabetes mellitus (HCC)   . GERD (gastroesophageal reflux disease)   . Coronary artery disease     distal vessel disease    Medications:  Prescriptions prior to admission  Medication Sig Dispense Refill Last Dose  . aspirin EC 81 MG tablet Take 81 mg by mouth daily.   11/03/2015 at Unknown time  . carvedilol (COREG) 25 MG tablet Take 25 mg by mouth 2 (two) times daily with a meal.    11/03/2015 at 530pm   . digoxin (LANOXIN) 0.125 MG tablet Take 1 tablet (0.125 mg  total) by mouth daily. 30 tablet 0 11/03/2015 at Unknown time  . furosemide (LASIX) 20 MG tablet Take 20 mg by mouth 2 (two) times daily.   11/03/2015 at Unknown time  . insulin aspart (NOVOLOG FLEXPEN) 100 UNIT/ML FlexPen Inject 10 Units into the skin 2 (two) times daily with a meal.   11/03/2015 at Unknown time  . insulin glargine (LANTUS) 100 UNIT/ML injection Inject 30 Units into the skin at bedtime.    11/02/2015 at Unknown time  . metFORMIN (GLUCOPHAGE) 1000 MG tablet Take 1,000 mg by mouth daily with breakfast.   11/03/2015 at Unknown time  . olmesartan (BENICAR) 20 MG tablet Take 1 tablet (20 mg total) by mouth daily. 30 tablet 6 11/03/2015 at Unknown time  . pregabalin (LYRICA) 75 MG capsule Take 150 mg by mouth at bedtime.   11/02/2015 at Unknown time  . rosuvastatin (CRESTOR) 20 MG tablet Take 1 tablet (20 mg total) by mouth daily. 90 tablet 3 11/03/2015 at Unknown time    Assessment: 55 y.o. male with Afib/ACS for anticoagulation.  Received Atrixtra 2.5 mg SQ in ED at 2315 for ACS.  Due to pt's size and Afib indication, bivalirudin started 6 hours after fondaparinux given.  Initial aPTT  is therapeutic at 65, will continue current dosing and check another aPTT in 4-8 hrs.  Goal of Therapy:  aPTT 50-85 Monitor platelets by anticoagulation protocol: Yes   Plan:  Continue bivalirudin 0.25 mg/kg/hr aPTT at 1300 Daily aPTT/CBC   Thank you for allowing [place] to participate in this patients care. Signe Colt, PharmD Pager: [phone]  11/04/2015,9:45 AM

## 2015-11-04 NOTE — ED Notes (Signed)
MD at bedside. 

## 2015-11-04 NOTE — Progress Notes (Signed)
Notified by lab Troponin 0.72. Pt is chest pain free at this time and received Arixtra in the ED and is to start IV Angiomax at 0500. Will continue to monitor. Dierdre Highman, RN

## 2015-11-04 NOTE — Progress Notes (Signed)
 Patient Name: Benjamin [name] Date of Encounter: 11/04/2015  Active Problems:   NSTEMI (non-ST elevated myocardial infarction) (HCC)   Atrial flutter with rapid ventricular response (HCC)   Primary Cardiologist: Dr Taylor Patient Profile: 54 yo with history of diffuse CAD (Decision has not yet been made on CHIP intervention on LAD and RCA), HTN, chronic systolic CHF/ischemic cardiomyopathy, and diabetes presented to ER 04/03 with atrial flutter and chest pressure. Ez elevated  SUBJECTIVE: No more chest pain, no SOB, no palpitations.  OBJECTIVE Filed Vitals:   11/04/15 0110 11/04/15 0357 11/04/15 0731 11/04/15 0816  BP: 116/78 100/63 118/88   Pulse: 68 78    Temp:  98.4 F (36.9 C)  98 F (36.7 C)  TempSrc:  Oral  Oral  Resp: 25 17 20   Height:      Weight:      SpO2: 98% 92% 98%     Intake/Output Summary (Last 24 hours) at 11/04/15 0819 Last data filed at 11/04/15 0743  Gross per 24 hour  Intake      0 ml  Output   1900 ml  Net  -1900 ml   Filed Weights   11/03/15 1941 11/04/15 0035  Weight: 232 lb 9.6 oz (105.507 kg) 233 lb 8 oz (105.915 kg)    PHYSICAL EXAM General: Well developed, well nourished, male in no acute distress. Head: Normocephalic, atraumatic.  Neck: Supple without bruits, JVD not elevated. Lungs:  Resp regular and unlabored, CTA. Heart: Irreg R&R, S1, S2, no S3, S4, or murmur; no rub. Abdomen: Soft, non-tender, non-distended, BS + x 4.  Extremities: No clubbing, cyanosis, edema.  Neuro: Alert and oriented X 3. Moves all extremities spontaneously. Psych: Normal affect.  LABS: CBC:  Recent Labs  11/03/15 2001  WBC 7.7  HGB 15.2  HCT 46.2  MCV 76.7*  PLT 174   Basic Metabolic Panel:  Recent Labs  11/03/15 2001  NA 140  K 4.6  CL 104  CO2 24  GLUCOSE 184*  BUN 19  CREATININE 1.29*  CALCIUM 9.8   Cardiac Enzymes:  Recent Labs  11/03/15 2101 11/04/15 0208  TROPONINI 0.07* 0.72*    Recent Labs  11/03/15 2021  TROPIPOC  0.04   BNP:  B NATRIURETIC PEPTIDE  Date/Time Value Ref Range Status  11/03/2015 08:21 PM 575.9* 0.0 - 100.0 pg/mL Final  10/06/2015 07:39 PM 1374.4* 0.0 - 100.0 pg/mL Final   Thyroid Function Tests:  Recent Labs  11/04/15 0209  TSH 1.154   TELE:  Atrial flutter, rate improved,      Radiology/Studies: Dg Chest 2 View 11/03/2015  CLINICAL DATA:  Chest pain, hypertension, tachycardia. EXAM: CHEST  2 VIEW COMPARISON:  10/07/2015 FINDINGS: There is unchanged moderate cardiomegaly. The transvenous biventricular pacing leads appear grossly intact. The right upper extremity PICC line has been removed. The lungs are clear. There is no pleural effusion. The pulmonary vasculature is normal. Hilar and mediastinal contours are unremarkable and unchanged. IMPRESSION: Cardiomegaly.  No acute cardiopulmonary findings. Electronically Signed   By: Daniel R Mitchell M.D.   On: 11/03/2015 20:26     Current Medications:  . aspirin EC  81 mg Oral Daily  . carvedilol  25 mg Oral BID WC  . digoxin  0.125 mg Oral Daily  . furosemide  40 mg Intravenous BID  . insulin aspart  0-15 Units Subcutaneous TID WC  . insulin aspart  0-5 Units Subcutaneous QHS  . insulin aspart  10 Units Subcutaneous BID WC  .   insulin glargine  30 Units Subcutaneous QHS  . losartan  25 mg Oral BID  . pregabalin  150 mg Oral QHS  . rosuvastatin  20 mg Oral Daily  . sodium chloride flush  3 mL Intravenous Q12H   . amiodarone 30 mg/hr (11/04/15 0452)  . bivalirudin (ANGIOMAX) infusion 5 mg/mL (Cath Lab,ACS,PCI indication) 0.25 mg/kg/hr (11/04/15 0447)    ASSESSMENT AND PLAN:  Active Problems:   NSTEMI (non-ST elevated myocardial infarction) (HCC) - for cath today, PCI per MD - pain-free on ASA, BB, statin    Atrial flutter with rapid ventricular response (HCC) - rate improved on amio - dig, coreg are at home doses - if stays on amio, ck LFTs and TSH    Anticoagulation - This patients CHA2DS2-VASc Score and unadjusted  Ischemic Stroke Rate (% per year) is equal to 4.8 % stroke rate/year from a score of 4 Above score calculated as 1 point each if present [CHF, HTN, DM, Vascular=MI - Decide on oral anticoagulation after cath - no pork products so is on Angiomax, not heparin/lovenox    HTN - BP control good on current rx    Chronic systolic CHF w/ St Jude CRT-D - on Lasix 40 mg IV BID for now, - recheck BMET today - follow I/O, weights  Signed, Barrett, Bjorn Loser , PA-C 8:19 AM 11/04/2015  The patient was seen, examined and discussed with [name], PA-C and I agree with the above.   The patient admitted with NSTEMI and new flutter that started yesterday, started on anticoagulation approximately 10 hours later, he has h/o ischemic cardiomyopathy with LVEF 10-15% and is post CRD-D in 05/2015.  Dr Katrinka Blazing wants to postpone high risk intervention with use of Impella till tomorrow or Friday (most probably), meanwhile we will plan for a DCCV tomorrow. I have discussed potential internal CV via ICD, however not recommended by EP as the patient still require full anesthesia a it depletes battery significantly. He can eat today, NPO after midnight for DCCV. On anticoagulation with angiomax as heparin and lovenox are port products (religious reasons).  Lars Masson 11/04/2015

## 2015-11-04 NOTE — Progress Notes (Addendum)
VASCULAR LAB PRELIMINARY  ARTERIAL  ABI completed:    RIGHT    LEFT    PRESSURE WAVEFORM  PRESSURE WAVEFORM  BRACHIAL 120 Triphasic BRACHIAL 119 Triphasic  AT 127 Triphasic AT 126 Biphasic  PT 132 Triphasic PT 137 Biphasic    RIGHT LEFT  ABI 1.10 1.14   Bilateral -  ABIs and Doppler waveforms are within normal limits at rest  Kimbery Harwood, RVS 11/04/2015, 8:25 PM

## 2015-11-05 ENCOUNTER — Other Ambulatory Visit (HOSPITAL_COMMUNITY): Payer: No Typology Code available for payment source

## 2015-11-05 ENCOUNTER — Encounter (HOSPITAL_COMMUNITY)
Admission: EM | Disposition: A | Discharge status: Home / Self Care | Payer: Self-pay | Source: Home / Self Care | Attending: Cardiology

## 2015-11-05 DIAGNOSIS — I255 Ischemic cardiomyopathy: Secondary | ICD-10-CM | POA: Diagnosis present

## 2015-11-05 HISTORY — PX: CARDIAC CATHETERIZATION: SHX172

## 2015-11-05 LAB — APTT: aPTT: 74 seconds — ABNORMAL HIGH (ref 24–37)

## 2015-11-05 LAB — BASIC METABOLIC PANEL
ANION GAP: 13 (ref 5–15)
BUN: 17 mg/dL (ref 6–20)
CO2: 25 mmol/L (ref 22–32)
Calcium: 9.3 mg/dL (ref 8.9–10.3)
Chloride: 103 mmol/L (ref 101–111)
Creatinine, Ser: 0.96 mg/dL (ref 0.61–1.24)
GFR calc Af Amer: >60 mL/min (ref >60)
Glucose, Bld: 149 mg/dL — ABNORMAL HIGH (ref 65–99)
POTASSIUM: 3.5 mmol/L (ref 3.5–5.1)
SODIUM: 141 mmol/L (ref 135–145)

## 2015-11-05 LAB — GLUCOSE, CAPILLARY
GLUCOSE-CAPILLARY: 157 mg/dL — AB (ref 65–99)
GLUCOSE-CAPILLARY: 145 mg/dL — AB (ref 65–99)
GLUCOSE-CAPILLARY: 146 mg/dL — AB (ref 65–99)
Glucose-Capillary: 136 mg/dL — ABNORMAL HIGH (ref 65–99)

## 2015-11-05 LAB — HEPATIC FUNCTION PANEL
ALT: 17 U/L (ref 17–63)
AST: 21 U/L (ref 15–41)
Albumin: 3.6 g/dL (ref 3.5–5.0)
Alkaline Phosphatase: 52 U/L (ref 38–126)
BILIRUBIN INDIRECT: 0.7 mg/dL (ref 0.3–0.9)
Bilirubin, Direct: 0.2 mg/dL (ref 0.1–0.5)
TOTAL PROTEIN: 6.5 g/dL (ref 6.5–8.1)
Total Bilirubin: 0.9 mg/dL (ref 0.3–1.2)

## 2015-11-05 LAB — PROTIME-INR
INR: 1.86 — ABNORMAL HIGH (ref 0.00–1.49)
Prothrombin Time: 21.4 seconds — ABNORMAL HIGH (ref 11.6–15.2)

## 2015-11-05 LAB — CBC
HCT: 45.3 % (ref 39.0–52.0)
Hemoglobin: 15.1 g/dL (ref 13.0–17.0)
MCH: 25.3 pg — ABNORMAL LOW (ref 26.0–34.0)
MCHC: 33.3 g/dL (ref 30.0–36.0)
MCV: 75.9 fL — ABNORMAL LOW (ref 78.0–100.0)
Platelets: 188 10*3/uL (ref 150–400)
RBC: 5.97 MIL/uL — ABNORMAL HIGH (ref 4.22–5.81)
RDW: 17.4 % — ABNORMAL HIGH (ref 11.5–15.5)
WBC: 9.3 10*3/uL (ref 4.0–10.5)

## 2015-11-05 LAB — POCT ACTIVATED CLOTTING TIME: Activated Clotting Time: 508 seconds

## 2015-11-05 LAB — PLATELET INHIBITION P2Y12: Platelet Function  P2Y12: 186 [PRU] — ABNORMAL LOW (ref 194–418)

## 2015-11-05 LAB — TSH: TSH: 1.257 u[IU]/mL (ref 0.350–4.500)

## 2015-11-05 SURGERY — CORONARY STENT INTERVENTION

## 2015-11-05 MED ORDER — IOPAMIDOL (ISOVUE-370) INJECTION 76%
INTRAVENOUS | Status: AC
  Filled 2015-11-05: qty 100

## 2015-11-05 MED ORDER — FENTANYL CITRATE (PF) 100 MCG/2ML IJ SOLN
INTRAMUSCULAR | Status: AC
  Filled 2015-11-05: qty 2

## 2015-11-05 MED ORDER — BIVALIRUDIN 250 MG IV SOLR
INTRAVENOUS | Status: AC
  Filled 2015-11-05: qty 250

## 2015-11-05 MED ORDER — NITROGLYCERIN IN D5W 200-5 MCG/ML-% IV SOLN
INTRAVENOUS | Status: AC
Start: 2015-11-05 — End: 2015-11-05
  Filled 2015-11-05: qty 250

## 2015-11-05 MED ORDER — ASPIRIN 81 MG PO CHEW
81.0000 mg | CHEWABLE_TABLET | Freq: Every day | ORAL | Status: DC
  Administered 2015-11-06 – 2015-11-09 (×4): 81 mg via ORAL
  Filled 2015-11-05 (×4): qty 1

## 2015-11-05 MED ORDER — SODIUM CHLORIDE 0.9% FLUSH: 3.0000 mL | INTRAVENOUS | Status: DC | PRN

## 2015-11-05 MED ORDER — VERAPAMIL HCL 2.5 MG/ML IV SOLN
INTRAVENOUS | Status: AC
  Filled 2015-11-05: qty 4

## 2015-11-05 MED ORDER — WARFARIN - PHARMACIST DOSING INPATIENT: Freq: Every day | Status: DC

## 2015-11-05 MED ORDER — BIVALIRUDIN BOLUS VIA INFUSION - CUPID
INTRAVENOUS | Status: DC | PRN
  Administered 2015-11-05: 77.25 mg via INTRAVENOUS

## 2015-11-05 MED ORDER — IOPAMIDOL (ISOVUE-370) INJECTION 76%
INTRAVENOUS | Status: AC
  Filled 2015-11-05: qty 125

## 2015-11-05 MED ORDER — SODIUM CHLORIDE 0.9 % IV SOLN: 250.0000 mL | INTRAVENOUS | Status: DC | PRN

## 2015-11-05 MED ORDER — LIDOCAINE HCL (PF) 1 % IJ SOLN
INTRAMUSCULAR | Status: AC
  Filled 2015-11-05: qty 30

## 2015-11-05 MED ORDER — MIDAZOLAM HCL 2 MG/2ML IJ SOLN
INTRAMUSCULAR | Status: AC
  Filled 2015-11-05: qty 2

## 2015-11-05 MED ORDER — ONDANSETRON HCL 4 MG/2ML IJ SOLN
4.0000 mg | Freq: Four times a day (QID) | INTRAMUSCULAR | Status: DC | PRN
  Administered 2015-11-06: 4 mg via INTRAVENOUS
  Filled 2015-11-05: qty 2

## 2015-11-05 MED ORDER — BIVALIRUDIN 250 MG IV SOLR
250.0000 mg | INTRAVENOUS | Status: DC | PRN
  Administered 2015-11-05 (×2): 1.75 mg/kg/h via INTRAVENOUS

## 2015-11-05 MED ORDER — CLOPIDOGREL BISULFATE 75 MG PO TABS
75.0000 mg | ORAL_TABLET | Freq: Every day | ORAL | Status: DC
  Administered 2015-11-06 – 2015-11-09 (×4): 75 mg via ORAL
  Filled 2015-11-05 (×4): qty 1

## 2015-11-05 MED ORDER — NITROGLYCERIN 1 MG/10 ML FOR IR/CATH LAB
INTRA_ARTERIAL | Status: AC
  Filled 2015-11-05: qty 20

## 2015-11-05 MED ORDER — SODIUM CHLORIDE 0.9 % IV SOLN
INTRAVENOUS | Status: AC
  Administered 2015-11-05: 50 mL/h via INTRAVENOUS

## 2015-11-05 MED ORDER — FENTANYL CITRATE (PF) 100 MCG/2ML IJ SOLN
INTRAMUSCULAR | Status: DC | PRN
  Administered 2015-11-05 (×3): 50 ug via INTRAVENOUS

## 2015-11-05 MED ORDER — ACETAMINOPHEN 325 MG PO TABS: 650.0000 mg | ORAL_TABLET | ORAL | Status: DC | PRN

## 2015-11-05 MED ORDER — MIDAZOLAM HCL 2 MG/2ML IJ SOLN
INTRAMUSCULAR | Status: DC | PRN
  Administered 2015-11-05: 0.5 mg via INTRAVENOUS
  Administered 2015-11-05 (×2): 1 mg via INTRAVENOUS

## 2015-11-05 MED ORDER — IOPAMIDOL (ISOVUE-370) INJECTION 76%
INTRAVENOUS | Status: DC | PRN
  Administered 2015-11-05: 395 mL via INTRA_ARTERIAL

## 2015-11-05 MED ORDER — HEPARIN SODIUM (PORCINE) 1000 UNIT/ML IJ SOLN
INTRAMUSCULAR | Status: AC
  Filled 2015-11-05: qty 1

## 2015-11-05 MED ORDER — OXYCODONE-ACETAMINOPHEN 5-325 MG PO TABS
ORAL_TABLET | ORAL | Status: AC
  Filled 2015-11-05: qty 2

## 2015-11-05 MED ORDER — CLOPIDOGREL BISULFATE 75 MG PO TABS: 75.0000 mg | ORAL_TABLET | Freq: Once | ORAL | Status: AC

## 2015-11-05 MED ORDER — LIDOCAINE HCL (PF) 1 % IJ SOLN
INTRAMUSCULAR | Status: DC | PRN
  Administered 2015-11-05 (×2): 20 mL

## 2015-11-05 MED ORDER — OXYCODONE-ACETAMINOPHEN 5-325 MG PO TABS
1.0000 | ORAL_TABLET | ORAL | Status: DC | PRN
  Administered 2015-11-05 – 2015-11-07 (×5): 2 via ORAL
  Filled 2015-11-05 (×4): qty 1
  Filled 2015-11-05 (×2): qty 2

## 2015-11-05 MED ORDER — ZOLPIDEM TARTRATE 5 MG PO TABS
5.0000 mg | ORAL_TABLET | Freq: Once | ORAL | Status: DC
  Filled 2015-11-05 (×2): qty 1

## 2015-11-05 MED ORDER — WARFARIN SODIUM 5 MG PO TABS
5.0000 mg | ORAL_TABLET | Freq: Once | ORAL | Status: AC
  Administered 2015-11-05: 5 mg via ORAL
  Filled 2015-11-05: qty 1

## 2015-11-05 MED ORDER — SODIUM CHLORIDE 0.9% FLUSH
3.0000 mL | Freq: Two times a day (BID) | INTRAVENOUS | Status: DC
  Administered 2015-11-05 – 2015-11-08 (×6): 3 mL via INTRAVENOUS

## 2015-11-05 MED ORDER — LIDOCAINE HCL (PF) 1 % IJ SOLN
INTRAMUSCULAR | Status: AC
Start: 2015-11-05 — End: 2015-11-05
  Filled 2015-11-05: qty 30

## 2015-11-05 MED ORDER — NITROGLYCERIN IN D5W 200-5 MCG/ML-% IV SOLN
INTRAVENOUS | Status: AC
  Filled 2015-11-05: qty 250

## 2015-11-05 MED ORDER — NITROGLYCERIN 1 MG/10 ML FOR IR/CATH LAB
INTRA_ARTERIAL | Status: AC
  Filled 2015-11-05: qty 10

## 2015-11-05 SURGICAL SUPPLY — 36 items
BALLN EMERGE MR 3.0X20 (BALLOONS) ×3
BALLN MAVERICK OTW 1.5X20 (BALLOONS) ×3
BALLN ~~LOC~~ EMERGE MR 3.0X8 (BALLOONS) ×3
BALLN ~~LOC~~ EMERGE MR 3.25X12 (BALLOONS) ×3
BALLOON EMERGE MR 3.0X20 (BALLOONS) IMPLANT
BALLOON MAVERICK OTW 1.5X20 (BALLOONS) IMPLANT
BALLOON ~~LOC~~ EMERGE MR 3.0X8 (BALLOONS) IMPLANT
BALLOON ~~LOC~~ EMERGE MR 3.25X12 (BALLOONS) IMPLANT
BURR ROTALINK 1.75MM (BURR) ×2 IMPLANT
CATH INFINITI 5FR ANG PIGTAIL (CATHETERS) ×2 IMPLANT
CATH ROTALINK PLUS 1.50MM (BURR) ×2 IMPLANT
CATH SITESEER 5F MULTI A 2 (CATHETERS) ×2 IMPLANT
CATH VISTA GUIDE 6FR XBLAD3.5 (CATHETERS) ×2 IMPLANT
CATH VISTA GUIDE 7FR JR4 (CATHETERS) ×2 IMPLANT
CATH VISTA GUIDE 7FR XBLAD4 (CATHETERS) ×2 IMPLANT
CUTTING BAL FLEXTOME RX2.75X10 (BALLOONS) ×2 IMPLANT
ELECT DEFIB PAD ADLT CADENCE (PAD) ×2 IMPLANT
KIT ENCORE 26 ADVANTAGE (KITS) ×2 IMPLANT
KIT HEART LEFT (KITS) ×3 IMPLANT
LUBRICANT ROTAGLIDE 20CC VIAL (MISCELLANEOUS) ×2 IMPLANT
LUBRICANT VIPERSLIDE CORONARY (MISCELLANEOUS) ×2 IMPLANT
PACK CARDIAC CATHETERIZATION (CUSTOM PROCEDURE TRAY) ×3 IMPLANT
SET IMPELLA CP PUMP (CATHETERS) ×2 IMPLANT
SHEATH FAST CATH 14F (SHEATH) ×2 IMPLANT
SHEATH PINNACLE 5F 10CM (SHEATH) ×2 IMPLANT
SHEATH PINNACLE 7F 10CM (SHEATH) ×4 IMPLANT
STENT SYNERGY DES 2.75X16 (Permanent Stent) ×2 IMPLANT
STENT SYNERGY DES 3X38 (Permanent Stent) ×2 IMPLANT
TRANSDUCER W/STOPCOCK (MISCELLANEOUS) ×3 IMPLANT
TUBING CIL FLEX 10 FLL-RA (TUBING) ×3 IMPLANT
WIRE ASAHI PROWATER 180CM (WIRE) ×2 IMPLANT
WIRE ASAHI PROWATER 300CM (WIRE) ×2 IMPLANT
WIRE EMERALD 3MM-J .035X150CM (WIRE) ×2 IMPLANT
WIRE EMERALD ST .035X150CM (WIRE) IMPLANT
WIRE ROTA FLOPPY .009X325CM (WIRE) ×2 IMPLANT
WIRE RUNTHROUGH .014X180CM (WIRE) ×2 IMPLANT

## 2015-11-05 NOTE — Progress Notes (Signed)
   Arrived early to speak to family concerning procedure and risk, but no one is currently available. Patient states it is not necessary and that he has spoken to them.

## 2015-11-05 NOTE — H&P (View-Only) (Signed)
Patient Name: Benjamin [name] Date of Encounter: 11/04/2015  Active Problems:   NSTEMI (non-ST elevated myocardial infarction) [facility])   Atrial flutter with rapid ventricular response [facility])   Primary Cardiologist: Benjamin Benjamin [name] Patient Profile: 55 yo with history of diffuse CAD (Decision has not yet been made on CHIP intervention on LAD and RCA), HTN, chronic systolic CHF/ischemic cardiomyopathy, and diabetes presented to ER 04/03 with atrial flutter and chest pressure. Ez elevated  SUBJECTIVE: No more chest pain, no SOB, no palpitations.  OBJECTIVE Filed Vitals:   11/04/15 0110 11/04/15 0357 11/04/15 0731 11/04/15 0816  BP: 116/78 100/63 118/88   Pulse: 68 78    Temp:  98.4 F (36.9 C)  98 F (36.7 C)  TempSrc:  Oral  Oral  Resp: Height:      Weight:      SpO2: 98% 92% 98%     Intake/Output Summary (Last 24 hours) at 11/04/15 0819 Last data filed at 11/04/15 0743  Gross per 24 hour  Intake      0 ml  Output   1900 ml  Net  -1900 ml   Filed Weights   11/03/15 1941 11/04/15 0035  Weight: 232 lb 9.6 oz (105.507 kg) 233 lb 8 oz (105.915 kg)    PHYSICAL EXAM General: Well developed, well nourished, male in no acute distress. Head: Normocephalic, atraumatic.  Neck: Supple without bruits, JVD not elevated. Lungs:  Resp regular and unlabored, CTA. Heart: Irreg R&R, S1, S2, no S3, S4, or murmur; no rub. Abdomen: Soft, non-tender, non-distended, BS + x 4.  Extremities: No clubbing, cyanosis, edema.  Neuro: Alert and oriented X 3. Moves all extremities spontaneously. Psych: Normal affect.  LABS: CBC:  Recent Labs  11/03/15 2001  WBC 7.7  HGB 15.2  HCT 46.2  MCV 76.7*  PLT 174   Basic Metabolic Panel:  Recent Labs  84/69/62 2001  NA 140  K 4.6  CL 104  CO2 24  GLUCOSE 184*  BUN 19  CREATININE 1.29*  CALCIUM 9.8   Cardiac Enzymes:  Recent Labs  11/03/15 2101 11/04/15 0208  TROPONINI 0.07* 0.72*    Recent Labs  11/03/15 2021  TROPIPOC  0.04   BNP:  B NATRIURETIC PEPTIDE  Date/Time Value Ref Range Status  11/03/2015 08:21 PM 575.9* 0.0 - 100.0 pg/mL Final  10/06/2015 07:39 PM 1374.4* 0.0 - 100.0 pg/mL Final   Thyroid Function Tests:  Recent Labs  11/04/15 0209  TSH 1.154   TELE:  Atrial flutter, rate improved,      Radiology/Studies: Dg Chest 2 View 11/03/2015  CLINICAL DATA:  Chest pain, hypertension, tachycardia. EXAM: CHEST  2 VIEW COMPARISON:  10/07/2015 FINDINGS: There is unchanged moderate cardiomegaly. The transvenous biventricular pacing leads appear grossly intact. The right upper extremity PICC line has been removed. The lungs are clear. There is no pleural effusion. The pulmonary vasculature is normal. Hilar and mediastinal contours are unremarkable and unchanged. IMPRESSION: Cardiomegaly.  No acute cardiopulmonary findings. Electronically Signed   By: Ellery Plunk M.D.   On: 11/03/2015 20:26     Current Medications:  . aspirin EC  81 mg Oral Daily  . carvedilol  25 mg Oral BID WC  . digoxin  0.125 mg Oral Daily  . furosemide  40 mg Intravenous BID  . insulin aspart  0-15 Units Subcutaneous TID WC  . insulin aspart  0-5 Units Subcutaneous QHS  . insulin aspart  10 Units Subcutaneous BID WC  .  insulin glargine  30 Units Subcutaneous QHS  . losartan  25 mg Oral BID  . pregabalin  150 mg Oral QHS  . rosuvastatin  20 mg Oral Daily  . sodium chloride flush  3 mL Intravenous Q12H   . amiodarone 30 mg/hr (11/04/15 0452)  . bivalirudin (ANGIOMAX) infusion 5 mg/mL (Cath Lab,ACS,PCI indication) 0.25 mg/kg/hr (11/04/15 0447)    ASSESSMENT AND PLAN:  Active Problems:   NSTEMI (non-ST elevated myocardial infarction) (HCC) - for cath today, PCI per MD - pain-free on ASA, BB, statin    Atrial flutter with rapid ventricular response (HCC) - rate improved on amio - dig, coreg are at home doses - if stays on amio, ck LFTs and TSH    Anticoagulation - This patients CHA2DS2-VASc Score and unadjusted  Ischemic Stroke Rate (% per year) is equal to 4.8 % stroke rate/year from a score of 4 Above score calculated as 1 point each if present [CHF, HTN, DM, Vascular=MI - Decide on oral anticoagulation after cath - no pork products so is on Angiomax, not heparin/lovenox    HTN - BP control good on current rx    Chronic systolic CHF w/ St Jude CRT-D - on Lasix 40 mg IV BID for now, - recheck BMET today - follow I/O, weights  Signed, Benjamin [name], Benjamin [name] , PA-C 8:19 AM 11/04/2015  The patient was seen, examined and discussed with Benjamin Demark, PA-C and I agree with the above.   The patient admitted with NSTEMI and new flutter that started yesterday, started on anticoagulation approximately 10 hours later, he has h/o ischemic cardiomyopathy with LVEF 10-15% and is post CRD-D in 05/2015.  Benjamin [name] wants to postpone high risk intervention with use of Impella till tomorrow or Friday (most probably), meanwhile we will plan for a DCCV tomorrow. I have discussed potential internal CV via ICD, however not recommended by EP as the patient still require full anesthesia a it depletes battery significantly. He can eat today, NPO after midnight for DCCV. On anticoagulation with angiomax as heparin and lovenox are port products (religious reasons).  Benjamin [name] 11/04/2015

## 2015-11-05 NOTE — Progress Notes (Addendum)
ANTICOAGULATION CONSULT NOTE - Initial Consult  Pharmacy Consult for warfarin Indication: atrial fibrillation  Allergies  Allergen Reactions  . Heparin Other (See Comments)    Will not use pork-derived products due to religious beliefs   . Lovenox [Enoxaparin Sodium] Other (See Comments)    Will not use pork-derived products due to religious beliefs   . Pork-Derived Products     Does not eat pork or pork derived products d/t religious reasons    Patient Measurements: Height: 5\' 7"  (170.2 cm) Weight: 227 lb 1.6 oz (103.012 kg) IBW/kg (Calculated) : 66.1  Vital Signs: Temp: 98.7 F (37.1 C) (04/05 0814) Temp Source: Oral (04/05 0814) BP: 86/57 mmHg (04/05 1450) Pulse Rate: 58 (04/05 1450)  Labs:  Recent Labs  11/03/15 2001  11/04/15 0208 11/04/15 0728 11/04/15 1155 11/05/15 0632  HGB 15.2  --   --  14.1  --  15.1  HCT 46.2  --   --  44.6  --  45.3  PLT 174  --   --  171  --  188  APTT  --   --   --  71* 71* 74*  LABPROT  --   --   --   --   --  21.4*  INR  --   --   --   --   --  1.86*  CREATININE 1.29*  --   --  1.07  --  0.96  TROPONINI  --   < > 0.72* 1.88* 1.86*  --   < > = values in this interval not displayed.  Estimated Creatinine Clearance: 101.8 mL/min (by C-G formula based on Cr of 0.96).   Medical History: Past Medical History  Diagnosis Date  . Hypertension   . High cholesterol   . Cholelithiasis   . Chronic systolic heart failure (HCC)   . AICD (automatic cardioverter/defibrillator) present   . Kidney stone   . Pneumonia 2002  . Type II diabetes mellitus (HCC)   . GERD (gastroesophageal reflux disease)   . Coronary artery disease     distal vessel disease    Assessment: 27 yoM with PMHx of ischemic cardiomyopathy, CHF that presented to ED with CP , tachycardia and atrial flutter. Initially started on bivalrudin (for religious reason avoids pork) that is now s/p cath with stenting to mid LAD and distal RCA. Per cardiology will continue  Aspirin, Plavix and warfarin, which is to start tonight, for one month and then drop aspirin. Bival has been stopped and pharmacy has been consulted to initiate warfarin for atrial flutter starting tonight. Baseline INR 1.86 ( taken while on bival), CBC stable, and amio gtt.   Goal of Therapy:  INR 2-3  Monitor platelets by anticoagulation protocol: Yes   Plan:  1. Will give warfarin 5 mg x 1 tonight 2. Daily INR and CBC 3. Noted Bival stopped post PCI  Pollyann Samples, PharmD, BCPS 11/05/2015, 3:51 PM Pager: [phone]

## 2015-11-05 NOTE — Hospital Discharge Follow-Up (Signed)
The patient is known to the [facility]. Will follow his hospital course and schedule follow up as needed.

## 2015-11-05 NOTE — Interval H&P Note (Signed)
Cath Lab Visit (complete for each Cath Lab visit)  Clinical Evaluation Leading to the Procedure:   ACS: Yes.    Non-ACS:    Anginal Classification: CCS III  Anti-ischemic medical therapy: Maximal Therapy (2 or more classes of medications)  Non-Invasive Test Results: No non-invasive testing performed  Prior CABG: No previous CABG      History and Physical Interval Note:  11/05/2015 7:31 AM  Benjamin [name]  has presented today for surgery, with the diagnosis of cad  The various methods of treatment have been discussed with the patient and family. After consideration of risks, benefits and other options for treatment, the patient has consented to  Procedure(s): Coronary Stent Intervention Rotablater (N/A) as a surgical intervention .  The patient's history has been reviewed, patient examined, no change in status, stable for surgery.  I have reviewed the patient's chart and labs.  Questions were answered to the patient's satisfaction.     Lesleigh Noe

## 2015-11-05 NOTE — Progress Notes (Addendum)
Site area: LFA Site Prior to Removal:  Level 0 Pressure Applied For:30 min Manual:   yes Patient Status During Pull:stable   Post Pull Site:  Level 0 Post Pull Instructions Given: yes  Post Pull Pulses Present: weakly palpable Dressing Applied: tegaderm  Bedrest begins @ 1455 till 1855 Comments:

## 2015-11-06 ENCOUNTER — Encounter (HOSPITAL_COMMUNITY): Payer: Self-pay | Admitting: Interventional Cardiology

## 2015-11-06 DIAGNOSIS — E785 Hyperlipidemia, unspecified: Secondary | ICD-10-CM

## 2015-11-06 DIAGNOSIS — I5022 Chronic systolic (congestive) heart failure: Secondary | ICD-10-CM

## 2015-11-06 DIAGNOSIS — I2510 Atherosclerotic heart disease of native coronary artery without angina pectoris: Secondary | ICD-10-CM

## 2015-11-06 LAB — CBC
HCT: 41.2 % (ref 39.0–52.0)
Hemoglobin: 13 g/dL (ref 13.0–17.0)
MCH: 24.1 pg — ABNORMAL LOW (ref 26.0–34.0)
MCHC: 31.6 g/dL (ref 30.0–36.0)
MCV: 76.4 fL — ABNORMAL LOW (ref 78.0–100.0)
Platelets: 141 10*3/uL — ABNORMAL LOW (ref 150–400)
RBC: 5.39 MIL/uL (ref 4.22–5.81)
RDW: 17.6 % — ABNORMAL HIGH (ref 11.5–15.5)
WBC: 7.9 10*3/uL (ref 4.0–10.5)

## 2015-11-06 LAB — GLUCOSE, CAPILLARY
GLUCOSE-CAPILLARY: 158 mg/dL — AB (ref 65–99)
GLUCOSE-CAPILLARY: 183 mg/dL — AB (ref 65–99)
Glucose-Capillary: 112 mg/dL — ABNORMAL HIGH (ref 65–99)

## 2015-11-06 LAB — BASIC METABOLIC PANEL
ANION GAP: 12 (ref 5–15)
BUN: 18 mg/dL (ref 6–20)
CALCIUM: 8.7 mg/dL — AB (ref 8.9–10.3)
CO2: 26 mmol/L (ref 22–32)
CREATININE: 0.96 mg/dL (ref 0.61–1.24)
Chloride: 102 mmol/L (ref 101–111)
GFR calc Af Amer: >60 mL/min (ref >60)
GLUCOSE: 119 mg/dL — AB (ref 65–99)
Potassium: 3.7 mmol/L (ref 3.5–5.1)
Sodium: 140 mmol/L (ref 135–145)

## 2015-11-06 LAB — PROTIME-INR
INR: 1.28 (ref 0.00–1.49)
Prothrombin Time: 16.1 seconds — ABNORMAL HIGH (ref 11.6–15.2)

## 2015-11-06 MED ORDER — AMIODARONE HCL 200 MG PO TABS: 200.0000 mg | ORAL_TABLET | Freq: Every day | ORAL | Status: DC

## 2015-11-06 MED ORDER — CARVEDILOL 12.5 MG PO TABS
12.5000 mg | ORAL_TABLET | Freq: Two times a day (BID) | ORAL | Status: DC
  Administered 2015-11-07 – 2015-11-09 (×5): 12.5 mg via ORAL
  Filled 2015-11-06 (×5): qty 1

## 2015-11-06 MED ORDER — WARFARIN SODIUM 7.5 MG PO TABS
7.5000 mg | ORAL_TABLET | Freq: Once | ORAL | Status: AC
  Administered 2015-11-06: 7.5 mg via ORAL
  Filled 2015-11-06: qty 1

## 2015-11-06 MED ORDER — AMIODARONE HCL 200 MG PO TABS
200.0000 mg | ORAL_TABLET | Freq: Every day | ORAL | Status: DC
  Administered 2015-11-06: 200 mg via ORAL
  Filled 2015-11-06: qty 1

## 2015-11-06 NOTE — Progress Notes (Addendum)
Patient Name: Benjamin [name] Date of Encounter: 11/06/2015  Active Problems:   Essential hypertension   Hyperlipidemia with target LDL less than 100   Chronic systolic congestive heart failure (HCC)   Coronary artery disease involving native coronary artery of native heart without angina pectoris   NSTEMI (non-ST elevated myocardial infarction) (HCC)   Atrial flutter with rapid ventricular response (HCC)   Cardiomyopathy, ischemic   Primary Cardiologist: Dr Ladona Ridgel Patient Profile: 55 yo with history of diffuse CAD (Decision has not yet been made on CHIP intervention on LAD and RCA), HTN, chronic systolic CHF/ischemic cardiomyopathy, and diabetes presented to ER 04/03 with atrial flutter and chest pressure. Ez elevated  SUBJECTIVE: The patient is doing well, the insertion site sensitive, no bleeding. No chest pain.  Marland Kitchen amiodarone 30 mg/hr (11/05/15 2303)   . aspirin  81 mg Oral Daily  . carvedilol  25 mg Oral BID WC  . clopidogrel  75 mg Oral Daily  . digoxin  0.125 mg Oral Daily  . furosemide  40 mg Intravenous BID  . insulin aspart  0-15 Units Subcutaneous TID WC  . insulin aspart  0-5 Units Subcutaneous QHS  . insulin aspart  10 Units Subcutaneous BID WC  . insulin glargine  30 Units Subcutaneous QHS  . losartan  25 mg Oral BID  . pregabalin  150 mg Oral QHS  . rosuvastatin  20 mg Oral Daily  . sodium chloride flush  3 mL Intravenous Q12H  . sodium chloride flush  3 mL Intravenous Q12H  . sodium chloride flush  3 mL Intravenous Q12H  . Warfarin - Pharmacist Dosing Inpatient   Does not apply q1800  . zolpidem  5 mg Oral Once    OBJECTIVE Filed Vitals:   11/06/15 0600 11/06/15 0916 11/06/15 0953 11/06/15 1001  BP: 98/74  85/66 103/69  Pulse: 58 60    Temp:      TempSrc:      Resp: 15  20 16   Height:      Weight:      SpO2: 99%       Intake/Output Summary (Last 24 hours) at 11/06/15 1026 Last data filed at 11/06/15 0600  Gross per 24 hour  Intake  427.2 ml  Output    1050 ml  Net -622.8 ml   Filed Weights   11/05/15 0335 11/05/15 1530 11/06/15 0500  Weight: 227 lb 1.6 oz (103.012 kg) 228 lb 9.9 oz (103.7 kg) 228 lb 2.8 oz (103.5 kg)    PHYSICAL EXAM General: Well developed, well nourished, male in no acute distress. Head: Normocephalic, atraumatic.  Neck: Supple without bruits, JVD not elevated. Lungs:  Resp regular and unlabored, CTA. Heart: R&R, S1, S2, no S3, S4, 2/6 systolic murmur; no rub. Abdomen: Soft, non-tender, non-distended, BS + x 4.  Extremities: No clubbing, cyanosis, edema.  Neuro: Alert and oriented X 3. Moves all extremities spontaneously. Psych: Normal affect.  LABS: CBC:  Recent Labs  11/05/15 0632 11/06/15 0345  WBC 9.3 7.9  HGB 15.1 13.0  HCT 45.3 41.2  MCV 75.9* 76.4*  PLT 188 141*   Basic Metabolic Panel:  Recent Labs  62/37/62 0632 11/06/15 0345  NA 141 140  K 3.5 3.7  CL 103 102  CO2 25 26  GLUCOSE 149* 119*  BUN 17 18  CREATININE 0.96 0.96  CALCIUM 9.3 8.7*   Cardiac Enzymes:  Recent Labs  11/04/15 0208 11/04/15 0728 11/04/15 1155  TROPONINI 0.72* 1.88* 1.86*  Recent Labs  11/03/15 2021  TROPIPOC 0.04   BNP:  B NATRIURETIC PEPTIDE  Date/Time Value Ref Range Status  11/03/2015 08:21 PM 575.9* 0.0 - 100.0 pg/mL Final  10/06/2015 07:39 PM 1374.4* 0.0 - 100.0 pg/mL Final   Thyroid Function Tests:  Recent Labs  11/05/15 0632  TSH 1.257   TELE:  Atrial flutter, rate improved,      Radiology/Studies: Dg Chest 2 View 11/03/2015  CLINICAL DATA:  Chest pain, hypertension, tachycardia. EXAM: CHEST  2 VIEW COMPARISON:  10/07/2015 FINDINGS: There is unchanged moderate cardiomegaly. The transvenous biventricular pacing leads appear grossly intact. The right upper extremity PICC line has been removed. The lungs are clear. There is no pleural effusion. The pulmonary vasculature is normal. Hilar and mediastinal contours are unremarkable and unchanged. IMPRESSION: Cardiomegaly.  No acute  cardiopulmonary findings. Electronically Signed   By: Ellery Plunk M.D.   On: 11/03/2015 20:26     Current Medications:  . aspirin  81 mg Oral Daily  . carvedilol  25 mg Oral BID WC  . clopidogrel  75 mg Oral Daily  . digoxin  0.125 mg Oral Daily  . furosemide  40 mg Intravenous BID  . insulin aspart  0-15 Units Subcutaneous TID WC  . insulin aspart  0-5 Units Subcutaneous QHS  . insulin aspart  10 Units Subcutaneous BID WC  . insulin glargine  30 Units Subcutaneous QHS  . losartan  25 mg Oral BID  . pregabalin  150 mg Oral QHS  . rosuvastatin  20 mg Oral Daily  . sodium chloride flush  3 mL Intravenous Q12H  . sodium chloride flush  3 mL Intravenous Q12H  . sodium chloride flush  3 mL Intravenous Q12H  . Warfarin - Pharmacist Dosing Inpatient   Does not apply q1800  . zolpidem  5 mg Oral Once   . amiodarone 30 mg/hr (11/05/15 2303)    ASSESSMENT AND PLAN:  Active Problems:  NSTEMI (non-ST elevated myocardial infarction) (HCC)  Paroxysmal Atrial flutter with rapid ventricular response (HCC)  Ischemic cardiomyopathy LVEF 10-15% HTN Chronic systolic CHF w/ St Jude CRT-D  The patient admitted with NSTEMI and new flutter on 11/03/15, interrogation of his BiV ICD showed that he has been on and off a-flutter for few days, cardioverted spontaneously on 11/04/15. S/p high risk intervention with Dr Katrinka Blazing yesterday with with heavy coronary calcification, and severe left ventricular dysfunction, EF less than 20% and LVEDP 34 mmHg. S/p successful rotational atherectomy and stenting of the mid LAD with reduction in a 95% stenosis to 0% with TIMI grade 3 flow S/P successful angioplasty and stenting of a segmental, distal RCA 50-95% stenosis to less than 20% with TIMI grade 3 flow. The very distal right coronary was treated with angioplasty only. The intervention with Impella support that was successfully removed post intervention.  Continue ASA, Plavix, rosuvastatin, carvedilol 25 mg po  BID, he is hypotensive today, and diaphoretic with it, I encouraged him to drink fluids, I will decrease carvedilol to 12.5 mg PO BID. Marland Kitchen Start loading with warfarin. Switch amiodarone drip to po amiodarone 200 mg po daily.  Lars Masson 11/06/2015

## 2015-11-06 NOTE — Hospital Discharge Follow-Up (Signed)
Transitional Care Clinic Care Coordination Note:  Admit date:  11/03/15 Discharge date: TBD Patient contact: [phone] (mobile) or [phone] Emergency contact(s):  This Case Manager reviewed patient's EMR and determined patient would benefit from post-discharge medical management and chronic care management services through the [facility]. Patient has a history of HTN, CAD, chronic systolic congestive heart failure, type II diabetes mellitus. Admitted with NSTEMI and paroxysmal atrial flutter with rapid ventricular response.  This Case Manager met with patient to discuss the services and medical management that can be provided at the [facility]. Patient verbalized understanding and agreed to receive post-discharge care at the [facility].   Patient scheduled for Transitional Care appointment on 11/18/15 at 1400 with Dr. Jarold Song.  Clinic information and appointment time provided to patient. Appointment information also placed on AVS.  Assessment:       Home Environment: Patient lives with his spouse in a private residence.       Support System: Spouse, family members       Level of functioning: Patient primarily independent with daily activities though he does sometimes have shortness of breath that limits his physical activity.       Home DME: none though patient does have a BP machine that he uses PRN as well as a glucometer and diabetes monitoring supplies.       Home care services: none       Transportation: Patient typically drives to his medical appointments.         Food/Nutrition: Patient's spouse shops for and prepares meals. Patient indicated he has access to needed food.         Medications: Patient indicated he typically gets his medications from [facility] or [facility] Department.         Identified Barriers: uninsured though patient does have [employer]. Patient has applied for Medicaid and Disability, and  application still pending. Patient indicated he has met with Financial Counselor this admission.        PCP-Dr. [place] and [place]             Arranged services:        Services communicated to Elissa Hefty, RN CM

## 2015-11-06 NOTE — Progress Notes (Signed)
CARDIAC REHAB PHASE I   PRE:  Rate/Rhythm: 68 paced  BP:  Sitting: 100/62        SaO2: 92 RA  MODE:  Ambulation: 700 ft   POST:  Rate/Rhythm: 63 paced  BP:  Sitting: 106/67         SaO2: 94 RA  Pt ambulated 700 ft on RA, handheld assist, steady gait, tolerated well, no complaints. Began MI/stent/PCI education.  Reviewed risk factors, MI book, anti-platelet therapy, stent card, atrial fibrillation, activity restrictions, ntg, and phase 2 cardiac rehab. Pt verbalized understanding, needs reinforcement. Pt agrees to phase 2 cardiac rehab referral, will send to [facility] per pt request. Pt states he does not have insurance. Pt to chair after walk, call bell within reach. Will follow. Needs "off the beat" book, CHF booklet/education, exercise guidelines and diet.    [phone] Joylene Grapes, RN, BSN 11/06/2015 2:54 PM

## 2015-11-06 NOTE — Progress Notes (Signed)
Patient diaphoretic, vomiting. Blood pressure 85/66. Heart rate in the 70's. Biomedical scientist paged. Pt sitting up in chair, alert and oriented, refuses offer to get back into bed. Will continue to monitor. - Alwyn Ren, RN

## 2015-11-06 NOTE — Progress Notes (Addendum)
Interventional Cardiology  I reviewed the patient's clinical data. I have not been able to see him today because of clinic requirements.  He seems to be doing relatively well. Spoke to his nurse and he has had no issue with his groin access sites.  Had nausea and diaphoresis this morning when he got out of bed. He was also hypotensive. This has resolved.  I note Dr. Lindaann Slough plan to decrease carvedilol dose. This is very reasonable in the setting where we are loading the patient with amiodarone.  Ambulation should be started.  As noted earlier, 3 drug therapy with aspirin, Plavix, and Coumadin for one month then drop aspirin.  P2Y12 is less than 190, which is therapeutic.  Will need follow-up in the advanced heart failure clinic.  Ambulate and anticipate home soon.

## 2015-11-06 NOTE — Progress Notes (Signed)
ANTICOAGULATION CONSULT NOTE  Pharmacy Consult for warfarin Indication: atrial fibrillation  Allergies  Allergen Reactions  . Heparin Other (See Comments)    Will not use pork-derived products due to religious beliefs   . Lovenox [Enoxaparin Sodium] Other (See Comments)    Will not use pork-derived products due to religious beliefs   . Pork-Derived Products     Does not eat pork or pork derived products d/t religious reasons    Patient Measurements: Height: 5\' 7"  (170.2 cm) Weight: 228 lb 2.8 oz (103.5 kg) IBW/kg (Calculated) : 66.1  Vital Signs: Temp: 98.1 F (36.7 C) (04/06 0400) Temp Source: Oral (04/06 0400) BP: 103/69 mmHg (04/06 1001) Pulse Rate: 60 (04/06 0916)  Labs:  Recent Labs  11/04/15 0208 11/04/15 0728 11/04/15 1155 11/05/15 0632 11/06/15 0345  HGB  --  14.1  --  15.1 13.0  HCT  --  44.6  --  45.3 41.2  PLT  --  171  --  188 141*  APTT  --  71* 71* 74*  --   LABPROT  --   --   --  21.4* 16.1*  INR  --   --   --  1.86* 1.28  CREATININE  --  1.07  --  0.96 0.96  TROPONINI 0.72* 1.88* 1.86*  --   --     Estimated Creatinine Clearance: 102.1 mL/min (by C-G formula based on Cr of 0.96).   Medical History: Past Medical History  Diagnosis Date  . Hypertension   . High cholesterol   . Cholelithiasis   . Chronic systolic heart failure (HCC)   . AICD (automatic cardioverter/defibrillator) present   . Kidney stone   . Pneumonia 2002  . Type II diabetes mellitus (HCC)   . GERD (gastroesophageal reflux disease)   . Coronary artery disease     distal vessel disease    Assessment: 8 yoM with PMHx of ischemic cardiomyopathy, CHF that presented to ED with CP , tachycardia and atrial flutter. Initially started on bivalrudin (for religious reason avoids pork) that is now s/p cath with stenting to mid LAD and distal RCA. Per cardiology will continue Aspirin, Plavix and warfarin.  Warfarin restarted last night, INR this am 1.28. No bleeding issues noted.    **Also of note we are loading patient with amiodarone so will watch his warfarin dosing closely.  Goal of Therapy:  INR 2-3  Monitor platelets by anticoagulation protocol: Yes   Plan:  1. Will give warfarin 7.5 mg x 1 tonight 2. Daily INR and CBC 3. Noted Bival stopped post PCI  Sheppard Coil PharmD., BCPS Clinical Pharmacist Pager [phone] 11/06/2015 3:29 PM

## 2015-11-07 LAB — BASIC METABOLIC PANEL
ANION GAP: 11 (ref 5–15)
BUN: 22 mg/dL — ABNORMAL HIGH (ref 6–20)
CALCIUM: 8.8 mg/dL — AB (ref 8.9–10.3)
CO2: 27 mmol/L (ref 22–32)
Chloride: 100 mmol/L — ABNORMAL LOW (ref 101–111)
Creatinine, Ser: 1.13 mg/dL (ref 0.61–1.24)
GFR calc Af Amer: >60 mL/min (ref >60)
Glucose, Bld: 104 mg/dL — ABNORMAL HIGH (ref 65–99)
POTASSIUM: 3.8 mmol/L (ref 3.5–5.1)
SODIUM: 138 mmol/L (ref 135–145)

## 2015-11-07 LAB — GLUCOSE, CAPILLARY
GLUCOSE-CAPILLARY: 124 mg/dL — AB (ref 65–99)
GLUCOSE-CAPILLARY: 143 mg/dL — AB (ref 65–99)
Glucose-Capillary: 201 mg/dL — ABNORMAL HIGH (ref 65–99)
Glucose-Capillary: 134 mg/dL — ABNORMAL HIGH (ref 65–99)
Glucose-Capillary: 145 mg/dL — ABNORMAL HIGH (ref 65–99)

## 2015-11-07 LAB — CBC
HCT: 39.7 % (ref 39.0–52.0)
Hemoglobin: 12.4 g/dL — ABNORMAL LOW (ref 13.0–17.0)
MCH: 23.9 pg — ABNORMAL LOW (ref 26.0–34.0)
MCHC: 31.2 g/dL (ref 30.0–36.0)
MCV: 76.5 fL — ABNORMAL LOW (ref 78.0–100.0)
Platelets: 142 10*3/uL — ABNORMAL LOW (ref 150–400)
RBC: 5.19 MIL/uL (ref 4.22–5.81)
RDW: 17.2 % — ABNORMAL HIGH (ref 11.5–15.5)
WBC: 8.8 10*3/uL (ref 4.0–10.5)

## 2015-11-07 LAB — PROTIME-INR
INR: 1.96 — ABNORMAL HIGH (ref 0.00–1.49)
Prothrombin Time: 22.2 seconds — ABNORMAL HIGH (ref 11.6–15.2)

## 2015-11-07 MED ORDER — WARFARIN SODIUM 4 MG PO TABS
4.0000 mg | ORAL_TABLET | Freq: Once | ORAL | Status: AC
  Administered 2015-11-07: 4 mg via ORAL
  Filled 2015-11-07: qty 1

## 2015-11-07 MED ORDER — WARFARIN VIDEO
Freq: Once | Status: AC
  Administered 2015-11-08: 10:00:00

## 2015-11-07 MED ORDER — AMIODARONE HCL 200 MG PO TABS
400.0000 mg | ORAL_TABLET | Freq: Two times a day (BID) | ORAL | Status: DC
  Administered 2015-11-07 – 2015-11-09 (×6): 400 mg via ORAL
  Filled 2015-11-07 (×6): qty 2

## 2015-11-07 MED ORDER — AMIODARONE HCL 200 MG PO TABS: 200.0000 mg | ORAL_TABLET | Freq: Every day | ORAL | Status: DC

## 2015-11-07 MED ORDER — PATIENT'S GUIDE TO USING COUMADIN BOOK
Freq: Once | Status: AC
  Administered 2015-11-07: 12:00:00
  Filled 2015-11-07: qty 1

## 2015-11-07 MED FILL — Heparin Sodium (Porcine) Inj 1000 Unit/ML: INTRAMUSCULAR | Qty: 10 | Status: AC

## 2015-11-07 MED FILL — Verapamil HCl IV Soln 2.5 MG/ML: INTRAVENOUS | Qty: 2 | Status: AC

## 2015-11-07 MED FILL — Nitroglycerin IV Soln 100 MCG/ML in D5W: INTRA_ARTERIAL | Qty: 10 | Status: AC

## 2015-11-07 MED FILL — Nitroglycerin IV Soln 200 MCG/ML in D5W: INTRAVENOUS | Qty: 250 | Status: AC

## 2015-11-07 NOTE — Discharge Instructions (Addendum)
Hold off on start coumadin until 11/10/2015  The plan is to continue aspirin, plavix and coumadin for now, stop aspirin after 1 month, then continue plavix and coumadin after that  Heart failure prevention plan: 1. Avoid salt 2. Limit daily fluid intake < 2L 3. Weigh yourself every morning, call cardiology if weight increase by more than 3 lbs overnight or 5 lbs in a single week.      Information on my medicine - Coumadin   (Warfarin)  This medication education was reviewed with me or my healthcare representative as part of my discharge preparation.  The pharmacist that spoke with me during my hospital stay was:  Severiano Gilbert, [facility]  Why was Coumadin prescribed for you? Coumadin was prescribed for you because you have a blood clot or a medical condition that can cause an increased risk of forming blood clots. Blood clots can cause serious health problems by blocking the flow of blood to the heart, lung, or brain. Coumadin can prevent harmful blood clots from forming. As a reminder your indication for Coumadin is:   Stroke Prevention Because Of Atrial Fibrillation  What test will check on my response to Coumadin? While on Coumadin (warfarin) you will need to have an INR test regularly to ensure that your dose is keeping you in the desired range. The INR (international normalized ratio) number is calculated from the result of the laboratory test called prothrombin time (PT).  If an INR APPOINTMENT HAS NOT ALREADY BEEN MADE FOR YOU please schedule an appointment to have this lab work done by your health care provider within 7 days. Your INR goal is usually a number between:  2 to 3 or your provider may give you a more narrow range like 2-2.5.  Ask your health care provider during an office visit what your goal INR is.  What  do you need to  know  About  COUMADIN? Take Coumadin (warfarin) exactly as prescribed by your healthcare provider about the same time each day.  DO NOT stop taking  without talking to the doctor who prescribed the medication.  Stopping without other blood clot prevention medication to take the place of Coumadin may increase your risk of developing a new clot or stroke.  Get refills before you run out.  What do you do if you miss a dose? If you miss a dose, take it as soon as you remember on the same day then continue your regularly scheduled regimen the next day.  Do not take two doses of Coumadin at the same time.  Important Safety Information A possible side effect of Coumadin (Warfarin) is an increased risk of bleeding. You should call your healthcare provider right away if you experience any of the following: ? Bleeding from an injury or your nose that does not stop. ? Unusual colored urine (red or dark brown) or unusual colored stools (red or black). ? Unusual bruising for unknown reasons. ? A serious fall or if you hit your head (even if there is no bleeding).  Some foods or medicines interact with Coumadin (warfarin) and might alter your response to warfarin. To help avoid this: ? Eat a balanced diet, maintaining a consistent amount of Vitamin K. ? Notify your provider about major diet changes you plan to make. ? Avoid alcohol or limit your intake to 1 drink for women and 2 drinks for men per day. (1 drink is 5 oz. wine, 12 oz. beer, or 1.5 oz. liquor.)  Make sure that  ANY health care provider who prescribes medication for you knows that you are taking Coumadin (warfarin).  Also make sure the healthcare provider who is monitoring your Coumadin knows when you have started a new medication including herbals and non-prescription products.  Coumadin (Warfarin)  Major Drug Interactions  Increased Warfarin Effect Decreased Warfarin Effect  Alcohol (large quantities) Antibiotics (esp. Septra/Bactrim, Flagyl, Cipro) Amiodarone (Cordarone) Aspirin (ASA) Cimetidine (Tagamet) Megestrol (Megace) NSAIDs (ibuprofen, naproxen, etc.) Piroxicam  (Feldene) Propafenone (Rythmol SR) Propranolol (Inderal) Isoniazid (INH) Posaconazole (Noxafil) Barbiturates (Phenobarbital) Carbamazepine (Tegretol) Chlordiazepoxide (Librium) Cholestyramine (Questran) Griseofulvin Oral Contraceptives Rifampin Sucralfate (Carafate) Vitamin K   Coumadin (Warfarin) Major Herbal Interactions  Increased Warfarin Effect Decreased Warfarin Effect  Garlic Ginseng Ginkgo biloba Coenzyme Q10 Green tea St. Johns wort    Coumadin (Warfarin) FOOD Interactions  Eat a consistent number of servings per week of foods HIGH in Vitamin K (1 serving =  cup)  Collards (cooked, or boiled & drained) Kale (cooked, or boiled & drained) Mustard greens (cooked, or boiled & drained) Parsley *serving size only =  cup Spinach (cooked, or boiled & drained) Swiss chard (cooked, or boiled & drained) Turnip greens (cooked, or boiled & drained)  Eat a consistent number of servings per week of foods MEDIUM-HIGH in Vitamin K (1 serving = 1 cup)  Asparagus (cooked, or boiled & drained) Broccoli (cooked, boiled & drained, or raw & chopped) Brussel sprouts (cooked, or boiled & drained) *serving size only =  cup Lettuce, raw (green leaf, endive, romaine) Spinach, raw Turnip greens, raw & chopped   These websites have more information on Coumadin (warfarin):  http://www.king-russell.com/; https://www.hines.net/;

## 2015-11-07 NOTE — Progress Notes (Signed)
Pt called out saying his heart is racing, patient back in A flutter, HR 100s, BP 100/87, paged PA Meng, orders to give 400 PO amio now, hold losartan, and give IV lasix and coreg once systolic BP >105. Will continue to monitor closely.

## 2015-11-07 NOTE — Progress Notes (Signed)
Paged by nursing staff, patient's SBP was 90s, he didn't want to take his lasix, coreg and lasix. He is now back in afib.   Instructed nurse to give coreg and lasix once SBP reach 105. OK to hold losartan. He went from IV amiodarone to 200mg  amiodarone, I will increase his amiodarone to 400mg  BID for 7 days, then resume 200mg  daily thereafter.  Consider change coreg to Toprol XL for better rate control.  Ramond Dial PA Pager: [phone]

## 2015-11-07 NOTE — Progress Notes (Signed)
ANTICOAGULATION CONSULT NOTE  Pharmacy Consult for warfarin Indication: atrial fibrillation  Allergies  Allergen Reactions  . Heparin Other (See Comments)    Will not use pork-derived products due to religious beliefs   . Lovenox [Enoxaparin Sodium] Other (See Comments)    Will not use pork-derived products due to religious beliefs   . Pork-Derived Products     Does not eat pork or pork derived products d/t religious reasons    Patient Measurements: Height: 5\' 7"  (170.2 cm) Weight: 229 lb (103.874 kg) IBW/kg (Calculated) : 66.1  Vital Signs: Temp: 98 F (36.7 C) (04/07 0740) Temp Source: Oral (04/07 0740) BP: 105/64 mmHg (04/07 0920)  Labs:  Recent Labs  11/04/15 1155  11/05/15 0632 11/06/15 0345 11/07/15 0300  HGB  --   < > 15.1 13.0 12.4*  HCT  --   --  45.3 41.2 39.7  PLT  --   --  188 141* 142*  APTT 71*  --  74*  --   --   LABPROT  --   --  21.4* 16.1* 22.2*  INR  --   --  1.86* 1.28 1.96*  CREATININE  --   --  0.96 0.96 1.13  TROPONINI 1.86*  --   --   --   --   < > = values in this interval not displayed.  Estimated Creatinine Clearance: 86.8 mL/min (by C-G formula based on Cr of 1.13).   Medical History: Past Medical History  Diagnosis Date  . Hypertension   . High cholesterol   . Cholelithiasis   . Chronic systolic heart failure (HCC)   . AICD (automatic cardioverter/defibrillator) present   . Kidney stone   . Pneumonia 2002  . Type II diabetes mellitus (HCC)   . GERD (gastroesophageal reflux disease)   . Coronary artery disease     distal vessel disease    Assessment: 78 yoM with PMHx of ischemic cardiomyopathy, CHF that presented to ED with CP , tachycardia and atrial flutter. Initially started on bivalrudin (for religious reason avoids pork) that is now s/p cath with stenting to mid LAD and distal RCA. Per cardiology will continue Aspirin, Plavix and warfarin.  Warfarin started 4/5, INR very labile somewhat question what role amiodarone is  playing in the sudden jump from 1.2>>1.9 overnight. No bleeding issues noted. Will back down on dosing tonight.   Goal of Therapy:  INR 2-3  Monitor platelets by anticoagulation protocol: Yes   Plan:  1. Will give warfarin 4 mg x 1 tonight 2. Daily INR and CBC 3. Noted Bival stopped post PCI  Sheppard Coil PharmD., [facility] Clinical Pharmacist Pager [phone] 11/07/2015 10:32 AM

## 2015-11-07 NOTE — Progress Notes (Signed)
Patient Name: Benjamin [name] Date of Encounter: 11/07/2015  Active Problems:   Essential hypertension   Hyperlipidemia with target LDL less than 100   Chronic systolic congestive heart failure (HCC)   Coronary artery disease involving native coronary artery of native heart without angina pectoris   NSTEMI (non-ST elevated myocardial infarction) (HCC)   Atrial flutter with rapid ventricular response (HCC)   Cardiomyopathy, ischemic   Primary Cardiologist: Dr Ladona Ridgel Patient Profile: 55 yo with history of diffuse CAD (Decision has not yet been made on CHIP intervention on LAD and RCA), HTN, chronic systolic CHF/ischemic cardiomyopathy, and diabetes presented to ER 04/03 with atrial flutter and chest pressure. Ez elevated  SUBJECTIVE: The patient is doing better today, no more diaphoresis or nausea. He feels tired, he felt palpitations this am and had a short episode of atrial flutter.   Marland Kitchen amiodarone Stopped (11/06/15 1209)   . [START ON 11/14/2015] amiodarone  200 mg Oral Daily  . amiodarone  400 mg Oral BID  . aspirin  81 mg Oral Daily  . carvedilol  12.5 mg Oral BID WC  . clopidogrel  75 mg Oral Daily  . digoxin  0.125 mg Oral Daily  . furosemide  40 mg Intravenous BID  . insulin aspart  0-15 Units Subcutaneous TID WC  . insulin aspart  0-5 Units Subcutaneous QHS  . insulin aspart  10 Units Subcutaneous BID WC  . insulin glargine  30 Units Subcutaneous QHS  . losartan  25 mg Oral BID  . pregabalin  150 mg Oral QHS  . rosuvastatin  20 mg Oral Daily  . sodium chloride flush  3 mL Intravenous Q12H  . sodium chloride flush  3 mL Intravenous Q12H  . Warfarin - Pharmacist Dosing Inpatient   Does not apply q1800  . zolpidem  5 mg Oral Once    OBJECTIVE Filed Vitals:   11/07/15 0304 11/07/15 0540 11/07/15 0740 11/07/15 0810  BP:  111/69 91/67 100/83  Pulse:      Temp:   98 F (36.7 C)   TempSrc:   Oral   Resp:  Height:      Weight: 229 lb (103.874 kg)     SpO2:  96%  98% 99%    Intake/Output Summary (Last 24 hours) at 11/07/15 0824 Last data filed at 11/07/15 0600  Gross per 24 hour  Intake    343 ml  Output   1000 ml  Net   -657 ml   Filed Weights   11/05/15 1530 11/06/15 0500 11/07/15 0304  Weight: 228 lb 9.9 oz (103.7 kg) 228 lb 2.8 oz (103.5 kg) 229 lb (103.874 kg)    PHYSICAL EXAM General: Well developed, well nourished, male in no acute distress. Head: Normocephalic, atraumatic.  Neck: Supple without bruits, JVD not elevated. Lungs:  Resp regular and unlabored, CTA. Heart: R&R, S1, S2, no S3, S4, 2/6 systolic murmur; no rub. Abdomen: Soft, non-tender, non-distended, BS + x 4.  Extremities: No clubbing, cyanosis, edema. Minimal bruising in the groins, no bruit Neuro: Alert and oriented X 3. Moves all extremities spontaneously. Psych: Normal affect.  LABS: CBC:  Recent Labs  11/06/15 0345 11/07/15 0300  WBC 7.9 8.8  HGB 13.0 12.4*  HCT 41.2 39.7  MCV 76.4* 76.5*  PLT 141* 142*   Basic Metabolic Panel:  Recent Labs  16/10/96 0345 11/07/15 0300  NA 140 138  K 3.7 3.8  CL 102 100*  CO2 26 27  GLUCOSE  119* 104*  BUN 18 22*  CREATININE 0.96 1.13  CALCIUM 8.7* 8.8*   Cardiac Enzymes:  Recent Labs  11/04/15 1155  TROPONINI 1.86*   No results for input(s): TROPIPOC in the last 72 hours. BNP:  B NATRIURETIC PEPTIDE  Date/Time Value Ref Range Status  11/03/2015 08:21 PM 575.9* 0.0 - 100.0 pg/mL Final  10/06/2015 07:39 PM 1374.4* 0.0 - 100.0 pg/mL Final   Thyroid Function Tests:  Recent Labs  11/05/15 0632  TSH 1.257   TELE:  Atrial flutter, rate improved,      Radiology/Studies: Dg Chest 2 View 11/03/2015  CLINICAL DATA:  Chest pain, hypertension, tachycardia. EXAM: CHEST  2 VIEW COMPARISON:  10/07/2015 FINDINGS: There is unchanged moderate cardiomegaly. The transvenous biventricular pacing leads appear grossly intact. The right upper extremity PICC line has been removed. The lungs are clear. There is no  pleural effusion. The pulmonary vasculature is normal. Hilar and mediastinal contours are unremarkable and unchanged. IMPRESSION: Cardiomegaly.  No acute cardiopulmonary findings. Electronically Signed   By: Ellery Plunk M.D.   On: 11/03/2015 20:26   Current Medications:  . [START ON 11/14/2015] amiodarone  200 mg Oral Daily  . amiodarone  400 mg Oral BID  . aspirin  81 mg Oral Daily  . carvedilol  12.5 mg Oral BID WC  . clopidogrel  75 mg Oral Daily  . digoxin  0.125 mg Oral Daily  . furosemide  40 mg Intravenous BID  . insulin aspart  0-15 Units Subcutaneous TID WC  . insulin aspart  0-5 Units Subcutaneous QHS  . insulin aspart  10 Units Subcutaneous BID WC  . insulin glargine  30 Units Subcutaneous QHS  . losartan  25 mg Oral BID  . pregabalin  150 mg Oral QHS  . rosuvastatin  20 mg Oral Daily  . sodium chloride flush  3 mL Intravenous Q12H  . sodium chloride flush  3 mL Intravenous Q12H  . Warfarin - Pharmacist Dosing Inpatient   Does not apply q1800  . zolpidem  5 mg Oral Once   . amiodarone Stopped (11/06/15 1209)    ASSESSMENT AND PLAN:  Active Problems:  NSTEMI (non-ST elevated myocardial infarction) (HCC)  Paroxysmal Atrial flutter with rapid ventricular response (HCC)  Ischemic cardiomyopathy LVEF 10-15% HTN Chronic systolic CHF w/ [employer] CRT-D  The patient admitted with NSTEMI and new flutter on 11/03/15, interrogation of his BiV ICD showed that he has been on and off a-flutter for few days, cardioverted spontaneously on 11/04/15. S/p high risk intervention with Dr Katrinka Blazing yesterday with with heavy coronary calcification, and severe left ventricular dysfunction, EF less than 20% and LVEDP 34 mmHg. S/p successful rotational atherectomy and stenting of the mid LAD with reduction in a 95% stenosis to 0% with TIMI grade 3 flow S/P successful angioplasty and stenting of a segmental, distal RCA 50-95% stenosis to less than 20% with TIMI grade 3 flow. The very distal right  coronary was treated with angioplasty only. The intervention with Impella support that was successfully removed post intervention.  Continue ASA, Plavix, rosuvastatin, carvedilol was decreased to 12.5 mg po BID as he had symptomatic hypotension,   As noted earlier, 3 drug therapy with aspirin, Plavix, and Coumadin for one month then drop aspirin.  P2Y12 is less than 190, which is therapeutic.  Will need follow-up in the advanced heart failure clinic.  Start loading with warfarin on 11/05/15.  Another episode of a-flutter this am, very short, we will increase amiodarone to 400  mg po daily.  Anticipated discharge tomorrow.  Lars Masson 11/07/2015

## 2015-11-07 NOTE — Progress Notes (Signed)
CARDIAC REHAB PHASE I   PRE:  Rate/Rhythm: paced 64  BP:  Supine: 98/63  Sitting:   Standing:    SaO2:   MODE:  Ambulation: 900 ft   POST:  Rate/Rhythm: 80 paced  BP:  Supine:   Sitting: 101/59  Standing:    SaO2: 97%RA 1305-1400 Pt walked 900 ft with steady gait. Tolerated well. No CP. Completed ed. Gave pt CHF booklet and reviewed zones. Pt stated he has scales at home and we reviewed daily weights and calling MD with weight gain. Gave low sodium diet handouts. Pt also knows to watch Vitamin K with his Coumadin. Gave OFF THE BEAT book and explained why it is important to be on blood thinner. Pt very receptive to ed. Gave ex ed and again discussed purpose of CRP 2. With pt needing to watch sodium, carbs, fats and Vit K., strongly recommend program as there is a dietitian available down stairs.    Luetta Nutting, RN BSN  11/07/2015 1:55 PM

## 2015-11-07 NOTE — Progress Notes (Signed)
Patient still refusing to take 0800 IV lasix and coreg. Pt notified of bed transfer status to 3W. Will continue to monitor.

## 2015-11-08 DIAGNOSIS — Z5181 Encounter for therapeutic drug level monitoring: Secondary | ICD-10-CM

## 2015-11-08 DIAGNOSIS — Z7901 Long term (current) use of anticoagulants: Secondary | ICD-10-CM

## 2015-11-08 LAB — GLUCOSE, CAPILLARY
GLUCOSE-CAPILLARY: 143 mg/dL — AB (ref 65–99)
GLUCOSE-CAPILLARY: 180 mg/dL — AB (ref 65–99)
Glucose-Capillary: 104 mg/dL — ABNORMAL HIGH (ref 65–99)
Glucose-Capillary: 157 mg/dL — ABNORMAL HIGH (ref 65–99)

## 2015-11-08 LAB — BASIC METABOLIC PANEL
Anion gap: 12 (ref 5–15)
BUN: 19 mg/dL (ref 6–20)
CO2: 24 mmol/L (ref 22–32)
Calcium: 8.8 mg/dL — ABNORMAL LOW (ref 8.9–10.3)
Chloride: 104 mmol/L (ref 101–111)
Creatinine, Ser: 0.87 mg/dL (ref 0.61–1.24)
GFR calc Af Amer: >60 mL/min (ref >60)
GFR calc non Af Amer: >60 mL/min (ref >60)
Glucose, Bld: 111 mg/dL — ABNORMAL HIGH (ref 65–99)
Potassium: 3.5 mmol/L (ref 3.5–5.1)
Sodium: 140 mmol/L (ref 135–145)

## 2015-11-08 LAB — CBC
HCT: 39.2 % (ref 39.0–52.0)
Hemoglobin: 12.7 g/dL — ABNORMAL LOW (ref 13.0–17.0)
MCH: 24.7 pg — ABNORMAL LOW (ref 26.0–34.0)
MCHC: 32.4 g/dL (ref 30.0–36.0)
MCV: 76.3 fL — ABNORMAL LOW (ref 78.0–100.0)
Platelets: 132 10*3/uL — ABNORMAL LOW (ref 150–400)
RBC: 5.14 MIL/uL (ref 4.22–5.81)
RDW: 17.6 % — ABNORMAL HIGH (ref 11.5–15.5)
WBC: 8.5 10*3/uL (ref 4.0–10.5)

## 2015-11-08 LAB — PROTIME-INR
INR: 3.3 — ABNORMAL HIGH (ref 0.00–1.49)
Prothrombin Time: 32.9 seconds — ABNORMAL HIGH (ref 11.6–15.2)

## 2015-11-08 NOTE — Progress Notes (Signed)
ANTICOAGULATION CONSULT NOTE  Pharmacy Consult for warfarin Indication: atrial fibrillation  Allergies  Allergen Reactions  . Heparin Other (See Comments)    Will not use pork-derived products due to religious beliefs   . Lovenox [Enoxaparin Sodium] Other (See Comments)    Will not use pork-derived products due to religious beliefs   . Pork-Derived Products     Does not eat pork or pork derived products d/t religious reasons    Patient Measurements: Height: 5\' 7"  (170.2 cm) Weight: 227 lb 8 oz (103.193 kg) IBW/kg (Calculated) : 66.1  Vital Signs: Temp: 98.6 F (37 C) (04/08 0556) Temp Source: Oral (04/08 0556) BP: 128/81 mmHg (04/08 0556) Pulse Rate: 76 (04/08 0957)  Labs:  Recent Labs  11/06/15 0345 11/07/15 0300 11/08/15 0555  HGB 13.0 12.4* 12.7*  HCT 41.2 39.7 39.2  PLT 141* 142* 132*  LABPROT 16.1* 22.2* 32.9*  INR 1.28 1.96* 3.30*  CREATININE 0.96 1.13 0.87    Estimated Creatinine Clearance: 112.4 mL/min (by C-G formula based on Cr of 0.87).   Medical History: Past Medical History  Diagnosis Date  . Hypertension   . High cholesterol   . Cholelithiasis   . Chronic systolic heart failure (HCC)   . AICD (automatic cardioverter/defibrillator) present   . Kidney stone   . Pneumonia 2002  . Type II diabetes mellitus (HCC)   . GERD (gastroesophageal reflux disease)   . Coronary artery disease     distal vessel disease    Assessment: 89 yoM with PMHx of ischemic cardiomyopathy, CHF that presented to ED with CP , tachycardia and atrial flutter. Initially started on bivalrudin (for religious reason avoids pork) that is now s/p cath with stenting to mid LAD and distal RCA. Per cardiology will continue Aspirin, Plavix and warfarin.  INR now supratherapuetic at 3.30 most likely due to amiodarone drug drug interaction.  No bleeding noted.  Plts 132, Hgb 12.7.   Goal of Therapy:  INR 2-3  Monitor platelets by anticoagulation protocol: Yes   Plan:  -hold  warfarin x 1 -Daily INR, CBC q3 days -Monitor for s/sx of bleeding.   Hazle Nordmann, PharmD Pharmacy Resident [phone]  11/08/2015 1:21 PM

## 2015-11-08 NOTE — Progress Notes (Signed)
CARDIAC REHAB PHASE I   PRE:  Rate/Rhythm: 60 paced  BP:  Supine:   Sitting: 114/80  Standing:    SaO2: 98% ra  MODE:  Ambulation: 600 ft   POST:  Rate/Rhythem: 78 paced  BP:  Supine:   Sitting: 114/60  Standing:    SaO2: 98% ra  1400-1425  Pt ambulated in hallway, pt able to walk independently. Steady gait. Pt states no need for reinforcement of education. pt instructed on importance of daily ambulation.  Pt questions about outpatient cardiac rehab answered. Pt verbalized understanding.    [employer]

## 2015-11-08 NOTE — Progress Notes (Signed)
Patient Name: Benjamin [name] Date of Encounter: 11/08/2015  Primary Cardiologist: Dr Ladona Ridgel   Active Problems:   Essential hypertension   Hyperlipidemia with target LDL less than 100   Chronic systolic congestive heart failure (HCC)   Coronary artery disease involving native coronary artery of native heart without angina pectoris   NSTEMI (non-ST elevated myocardial infarction) (HCC)   Atrial flutter with rapid ventricular response (HCC)   Cardiomyopathy, ischemic    SUBJECTIVE  Denies any CP or SOB.   CURRENT MEDS . [START ON 11/14/2015] amiodarone  200 mg Oral Daily  . amiodarone  400 mg Oral BID  . aspirin  81 mg Oral Daily  . carvedilol  12.5 mg Oral BID WC  . clopidogrel  75 mg Oral Daily  . digoxin  0.125 mg Oral Daily  . furosemide  40 mg Intravenous BID  . insulin aspart  0-15 Units Subcutaneous TID WC  . insulin aspart  0-5 Units Subcutaneous QHS  . insulin aspart  10 Units Subcutaneous BID WC  . insulin glargine  30 Units Subcutaneous QHS  . losartan  25 mg Oral BID  . pregabalin  150 mg Oral QHS  . rosuvastatin  20 mg Oral Daily  . sodium chloride flush  3 mL Intravenous Q12H  . sodium chloride flush  3 mL Intravenous Q12H  . warfarin   Does not apply Once  . Warfarin - Pharmacist Dosing Inpatient   Does not apply q1800  . zolpidem  5 mg Oral Once    OBJECTIVE  Filed Vitals:   11/07/15 1359 11/07/15 1443 11/07/15 2115 11/08/15 0556  BP: 110/65 103/62 106/59 128/81  Pulse:  62 62 72  Temp:  98.1 F (36.7 C) 98.4 F (36.9 C) 98.6 F (37 C)  TempSrc:  Oral Oral Oral  Resp:   18 18  Height:      Weight:    227 lb 8 oz (103.193 kg)  SpO2:  97% 98% 95%    Intake/Output Summary (Last 24 hours) at 11/08/15 0919 Last data filed at 11/08/15 0916  Gross per 24 hour  Intake    600 ml  Output   2200 ml  Net  -1600 ml   Filed Weights   11/06/15 0500 11/07/15 0304 11/08/15 0556  Weight: 228 lb 2.8 oz (103.5 kg) 229 lb (103.874 kg) 227 lb 8 oz (103.193 kg)      PHYSICAL EXAM  General: Pleasant, NAD. Neuro: Alert and oriented X 3. Moves all extremities spontaneously. Psych: Normal affect. HEENT:  Normal  Neck: Supple without bruits or JVD. Lungs:  Resp regular and unlabored, CTA. Heart: RRR no s3, s4, or murmurs. Abdomen: Soft, non-tender, non-distended, BS + x 4.  Extremities: No clubbing, cyanosis or edema. DP/PT/Radials 2+ and equal bilaterally.  Accessory Clinical Findings  CBC  Recent Labs  11/07/15 0300 11/08/15 0555  WBC 8.8 8.5  HGB 12.4* 12.7*  HCT 39.7 39.2  MCV 76.5* 76.3*  PLT 142* 132*   Basic Metabolic Panel  Recent Labs  11/07/15 0300 11/08/15 0555  NA 138 140  K 3.8 3.5  CL 100* 104  CO2 27 24  GLUCOSE 104* 111*  BUN 22* 19  CREATININE 1.13 0.87  CALCIUM 8.8* 8.8*    TELE Paced rhythm with bundle branch block     ECG  No new EKG  Echocardiogram 03/27/2016  LV EF: 20%  ------------------------------------------------------------------- Indications: CHF - 428.0.  ------------------------------------------------------------------- History: Risk factors: Hypertension. Diabetes mellitus. Dyslipidemia.  -------------------------------------------------------------------  Study Conclusions  - Left ventricle: The cavity size was severely dilated. Wall  thickness was increased in a pattern of mild LVH. The estimated  ejection fraction was 20%. Diffuse hypokinesis. Doppler  parameters are consistent with elevated ventricular end-diastolic  filling pressure. - Mitral valve: There was mild regurgitation. - Left atrium: The atrium was severely dilated. - Right ventricle: The cavity size was mildly dilated. - Atrial septum: No defect or patent foramen ovale was identified.    Radiology/Studies  Dg Chest 2 View  11/03/2015  CLINICAL DATA:  Chest pain, hypertension, tachycardia. EXAM: CHEST  2 VIEW COMPARISON:  10/07/2015 FINDINGS: There is unchanged moderate cardiomegaly. The  transvenous biventricular pacing leads appear grossly intact. The right upper extremity PICC line has been removed. The lungs are clear. There is no pleural effusion. The pulmonary vasculature is normal. Hilar and mediastinal contours are unremarkable and unchanged. IMPRESSION: Cardiomegaly.  No acute cardiopulmonary findings. Electronically Signed   By: Ellery Plunk M.D.   On: 11/03/2015 20:26    ASSESSMENT AND PLAN  The patient admitted with NSTEMI and new flutter on 11/03/15, interrogation of his BiV ICD showed that he has been on and off a-flutter for few days, cardioverted spontaneously on 11/04/15. S/p high risk intervention with Dr. Katrinka Blazing requiring temp impella support which was successfully removed post intervention  1. NSTEMI  - cath 11/05/2015 20% prox RCA, 90% RPDA, 100% diag, 90% distal LAD, 60% Ost LCx, 95% OM2, 60% prox LCx, 95% mid LAD treated with DES, 90% distal RCA treated with DES. EF < 20%, LVEDP 34 mmHg prior to insertion of Impella. Impella removed in the cath lab after procedure.   - continue ASA and plavix with plan to drop ASA after 1 month given need for coumadin, then continue plavix and coumadin afterward.   - denies any CP, CBC and BMET stable, will discuss with MD potentially hold him for 1 more day to observe INR trend since he is on triple therapy. After discharge, he will need to be followed in the heart failure clinic.  2. Paroxysmal atrial flutter on coumadin  - CHA2DS2-Vasc score 4 (HTN, HF, CAD, DM)  - He has significant rise in his INR from 1.28 --> 1.96 --> 3.3. Discussed with the pharmacist, felt the fast rise in INR is likely related to the fact that he is also on amiodarone. We will discuss with M.D. regarding potentially holding for 1 more day to observe INR trend. If stable, plan to hold Coumadin for today and tomorrow and restart Coumadin at 2.5 mg on Monday. He will need to lower than normal Coumadin given the need for amiodarone.  - Amiodarone was  increased to 400 mg twice a day due to recurrent atrial flutter, plan to continue this dose for 7 days before titrating down to 200 mg daily thereafter.  3. ICM with baseline EF < 20% s/p St Jude CRT-D  4. HTN   Signed, Amedeo Plenty Pager: [phone]  The patient was seen and examined, and I agree with the physical exam, assessment and plan as documented above which has been discussed with H. Meng PA-C, with modifications as noted below. Pt feels well and denies chest pain and shortness of breath. INR steadily climbing and is on ASA, Plavix, warfarin, and amiodarone. Will monitor for another day (for bleeding problems) and not administer warfarin today or tomorrow with plans to restart at lower dose on Monday. Plan to discharge tomorrow.  Prentice Docker, MD, [facility]  11/08/2015 9:50  AM    

## 2015-11-09 LAB — CBC
HCT: 39.8 % (ref 39.0–52.0)
Hemoglobin: 12.7 g/dL — ABNORMAL LOW (ref 13.0–17.0)
MCH: 24.4 pg — ABNORMAL LOW (ref 26.0–34.0)
MCHC: 31.9 g/dL (ref 30.0–36.0)
MCV: 76.5 fL — ABNORMAL LOW (ref 78.0–100.0)
Platelets: 133 10*3/uL — ABNORMAL LOW (ref 150–400)
RBC: 5.2 MIL/uL (ref 4.22–5.81)
RDW: 17.3 % — ABNORMAL HIGH (ref 11.5–15.5)
WBC: 8.3 10*3/uL (ref 4.0–10.5)

## 2015-11-09 LAB — GLUCOSE, CAPILLARY
Glucose-Capillary: 100 mg/dL — ABNORMAL HIGH (ref 65–99)
Glucose-Capillary: 144 mg/dL — ABNORMAL HIGH (ref 65–99)

## 2015-11-09 LAB — PROTIME-INR
INR: 3.16 — ABNORMAL HIGH (ref 0.00–1.49)
Prothrombin Time: 31.8 seconds — ABNORMAL HIGH (ref 11.6–15.2)

## 2015-11-09 MED ORDER — FUROSEMIDE 20 MG PO TABS: 60.0000 mg | ORAL_TABLET | Freq: Every day | ORAL | Status: DC

## 2015-11-09 MED ORDER — AMIODARONE HCL 200 MG PO TABS: ORAL_TABLET | ORAL | Status: DC

## 2015-11-09 MED ORDER — POTASSIUM CHLORIDE ER 20 MEQ PO TBCR: 20.0000 meq | EXTENDED_RELEASE_TABLET | Freq: Every day | ORAL | Status: DC

## 2015-11-09 MED ORDER — LOSARTAN POTASSIUM 25 MG PO TABS: 25.0000 mg | ORAL_TABLET | Freq: Two times a day (BID) | ORAL | Status: DC

## 2015-11-09 MED ORDER — CARVEDILOL 25 MG PO TABS: 12.5000 mg | ORAL_TABLET | Freq: Two times a day (BID) | ORAL | Status: DC

## 2015-11-09 MED ORDER — WARFARIN SODIUM 2.5 MG PO TABS: 2.5000 mg | ORAL_TABLET | Freq: Every day | ORAL | Status: DC

## 2015-11-09 MED ORDER — FUROSEMIDE 40 MG PO TABS: 60.0000 mg | ORAL_TABLET | Freq: Every day | ORAL | Status: DC

## 2015-11-09 MED ORDER — NITROGLYCERIN 0.4 MG SL SUBL: 0.4000 mg | SUBLINGUAL_TABLET | SUBLINGUAL | Status: AC | PRN

## 2015-11-09 MED ORDER — CLOPIDOGREL BISULFATE 75 MG PO TABS: 75.0000 mg | ORAL_TABLET | Freq: Every day | ORAL | Status: DC

## 2015-11-09 NOTE — Progress Notes (Signed)
ANTICOAGULATION CONSULT NOTE  Pharmacy Consult for warfarin Indication: atrial fibrillation  Allergies  Allergen Reactions  . Heparin Other (See Comments)    Will not use pork-derived products due to religious beliefs   . Lovenox [Enoxaparin Sodium] Other (See Comments)    Will not use pork-derived products due to religious beliefs   . Pork-Derived Products     Does not eat pork or pork derived products d/t religious reasons    Patient Measurements: Height: 5\' 7"  (170.2 cm) Weight: 224 lb 8 oz (101.833 kg) IBW/kg (Calculated) : 66.1  Vital Signs: Temp: 98.5 F (36.9 C) (04/09 0612) Temp Source: Oral (04/09 0612) BP: 123/77 mmHg (04/09 0612) Pulse Rate: 60 (04/09 1003)  Labs:  Recent Labs  11/07/15 0300 11/08/15 0555 11/09/15 0528  HGB 12.4* 12.7* 12.7*  HCT 39.7 39.2 39.8  PLT 142* 132* 133*  LABPROT 22.2* 32.9* 31.8*  INR 1.96* 3.30* 3.16*  CREATININE 1.13 0.87  --     Estimated Creatinine Clearance: 111.7 mL/min (by C-G formula based on Cr of 0.87).   Medical History: Past Medical History  Diagnosis Date  . Hypertension   . High cholesterol   . Cholelithiasis   . Chronic systolic heart failure (HCC)   . AICD (automatic cardioverter/defibrillator) present   . Kidney stone   . Pneumonia 2002  . Type II diabetes mellitus (HCC)   . GERD (gastroesophageal reflux disease)   . Coronary artery disease     distal vessel disease    Assessment: 75 yoM with PMHx of ischemic cardiomyopathy, CHF that presented to ED with CP , tachycardia and atrial flutter. Initially started on bivalrudin (for religious reason avoids pork) that is now s/p cath with stenting to mid LAD and distal RCA. Per cardiology will continue Aspirin, Plavix and warfarin.  INR 1.28>>1.9 >>3.30>3.16 most likely due to amiodarone.  No bleeding noted.  Plts 133, Hgb 12.7.   Goal of Therapy:  INR 2-3  Monitor platelets by anticoagulation protocol: Yes   Plan:  -hold warfarin x 1 more day   -Daily INR, CBC q3 days while inpatient  -Monitor for s/sx of bleeding -If discharged today, would recommend clinic INR followup tomorrow and possibly starting Warfarin at 1 to 2.5 mg lower dose depending on INR result.   Hazle Nordmann, PharmD Pharmacy Resident [phone]  11/09/2015 10:34 AM

## 2015-11-09 NOTE — Plan of Care (Signed)
Problem: Health Behavior/Discharge Planning: Goal: Ability to manage health-related needs will improve Outcome: Completed/Met Date Met:  11/09/15 Patient seen by case manager, match program complete and patient given instruction for prescriptions at clinic

## 2015-11-09 NOTE — Progress Notes (Signed)
Patient Name: Benjamin [name] Date of Encounter: 11/09/2015  Primary Cardiologist: Dr Ladona Ridgel   Active Problems:   Essential hypertension   Hyperlipidemia with target LDL less than 100   Chronic systolic congestive heart failure (HCC)   Coronary artery disease involving native coronary artery of native heart without angina pectoris   NSTEMI (non-ST elevated myocardial infarction) (HCC)   Atrial flutter with rapid ventricular response (HCC)   Cardiomyopathy, ischemic    SUBJECTIVE  Denies any CP or SOB.   CURRENT MEDS . [START ON 11/14/2015] amiodarone  200 mg Oral Daily  . amiodarone  400 mg Oral BID  . aspirin  81 mg Oral Daily  . carvedilol  12.5 mg Oral BID WC  . clopidogrel  75 mg Oral Daily  . digoxin  0.125 mg Oral Daily  . [START ON 11/10/2015] furosemide  60 mg Oral Daily  . insulin aspart  0-15 Units Subcutaneous TID WC  . insulin aspart  0-5 Units Subcutaneous QHS  . insulin aspart  10 Units Subcutaneous BID WC  . insulin glargine  30 Units Subcutaneous QHS  . losartan  25 mg Oral BID  . pregabalin  150 mg Oral QHS  . rosuvastatin  20 mg Oral Daily  . sodium chloride flush  3 mL Intravenous Q12H  . sodium chloride flush  3 mL Intravenous Q12H  . Warfarin - Pharmacist Dosing Inpatient   Does not apply q1800  . zolpidem  5 mg Oral Once    OBJECTIVE  Filed Vitals:   11/08/15 0957 11/08/15 1504 11/08/15 2125 11/09/15 0612  BP:  109/59 101/63 123/77  Pulse: 76 62 62   Temp:  98.5 F (36.9 C) 98.3 F (36.8 C) 98.5 F (36.9 C)  TempSrc:  Oral  Oral  Resp:  16 19 18   Height:      Weight:    224 lb 8 oz (101.833 kg)  SpO2:  97% 100% 96%    Intake/Output Summary (Last 24 hours) at 11/09/15 0917 Last data filed at 11/09/15 0817  Gross per 24 hour  Intake    600 ml  Output   1825 ml  Net  -1225 ml   Filed Weights   11/07/15 0304 11/08/15 0556 11/09/15 0612  Weight: 229 lb (103.874 kg) 227 lb 8 oz (103.193 kg) 224 lb 8 oz (101.833 kg)    PHYSICAL  EXAM  General: Pleasant, NAD. Neuro: Alert and oriented X 3. Moves all extremities spontaneously. Psych: Normal affect. HEENT:  Normal  Neck: Supple without bruits or JVD. Lungs:  Resp regular and unlabored, CTA. Heart: RRR no s3, s4, or murmurs. Abdomen: Soft, non-tender, non-distended, BS + x 4.  Extremities: No clubbing, cyanosis or edema. DP/PT/Radials 2+ and equal bilaterally.  Accessory Clinical Findings  CBC  Recent Labs  11/08/15 0555 11/09/15 0528  WBC 8.5 8.3  HGB 12.7* 12.7*  HCT 39.2 39.8  MCV 76.3* 76.5*  PLT 132* 133*   Basic Metabolic Panel  Recent Labs  11/07/15 0300 11/08/15 0555  NA 138 140  K 3.8 3.5  CL 100* 104  CO2 27 24  GLUCOSE 104* 111*  BUN 22* 19  CREATININE 1.13 0.87  CALCIUM 8.8* 8.8*    TELE Paced rhythm with bundle branch block     ECG  No new EKG  Echocardiogram 03/28/2015  LV EF: 20%  ------------------------------------------------------------------- Indications: CHF - 428.0.  ------------------------------------------------------------------- History: Risk factors: Hypertension. Diabetes mellitus. Dyslipidemia.  ------------------------------------------------------------------- Study Conclusions  - Left ventricle: The  cavity size was severely dilated. Wall  thickness was increased in a pattern of mild LVH. The estimated  ejection fraction was 20%. Diffuse hypokinesis. Doppler  parameters are consistent with elevated ventricular end-diastolic  filling pressure. - Mitral valve: There was mild regurgitation. - Left atrium: The atrium was severely dilated. - Right ventricle: The cavity size was mildly dilated. - Atrial septum: No defect or patent foramen ovale was identified.    Radiology/Studies  Dg Chest 2 View  11/03/2015  CLINICAL DATA:  Chest pain, hypertension, tachycardia. EXAM: CHEST  2 VIEW COMPARISON:  10/07/2015 FINDINGS: There is unchanged moderate cardiomegaly. The transvenous  biventricular pacing leads appear grossly intact. The right upper extremity PICC line has been removed. The lungs are clear. There is no pleural effusion. The pulmonary vasculature is normal. Hilar and mediastinal contours are unremarkable and unchanged. IMPRESSION: Cardiomegaly.  No acute cardiopulmonary findings. Electronically Signed   By: Ellery Plunk M.D.   On: 11/03/2015 20:26    ASSESSMENT AND PLAN  The patient admitted with NSTEMI and new flutter on 11/03/15, interrogation of his BiV ICD showed that he has been on and off a-flutter for few days, cardioverted spontaneously on 11/04/15. S/p high risk intervention with Dr. Katrinka Blazing requiring temp impella support which was successfully removed post intervention  1. NSTEMI  - cath 11/05/2015 20% prox RCA, 90% RPDA, 100% diag, 90% distal LAD, 60% Ost LCx, 95% OM2, 60% prox LCx, 95% mid LAD treated with DES, 90% distal RCA treated with DES. EF < 20%, LVEDP 34 mmHg prior to insertion of Impella. Impella removed in the cath lab after procedure.   - continue ASA and plavix with plan to drop ASA after 1 month given need for coumadin, then continue plavix and coumadin afterward.   - INR jumped from 1.8 up to 3.3 yesterday, INR down to 3.1 this morning, discussed with pharmacist yesterday, plan to hold coumadin today, restart tomorrow 4/10 at 2.5mg  daily and have him followup in the coumadin in clinic either Tue or Wed. After discharge, he will need to be followed in the heart failure clinic.  2. Paroxysmal atrial flutter on coumadin  - CHA2DS2-Vasc score 4 (HTN, HF, CAD, DM)  - He has significant rise in his INR from 1.28 --> 1.96 --> 3.3. Discussed with the pharmacist, felt the fast rise in INR is likely related to the fact that he is also on amiodarone. We will discuss with M.D. regarding potentially holding for 1 more day to observe INR trend. If stable, plan to hold Coumadin for today and tomorrow and restart Coumadin at 2.5 mg on Monday. He will need to  lower than normal Coumadin given the need for amiodarone.  - Amiodarone was increased to 400 mg twice a day due to recurrent atrial flutter, plan to continue this dose for 7 days before titrating down to 200 mg daily thereafter.  3. ICM with baseline EF < 20% s/p St Jude CRT-D  - transitioned to  PO lasix, I/O - 7L. Weight down from 232 down to 224 lbs which will serve as his dry weight.   - He will need BMET in 7 days, and move HF appointment sooner in 7-10 days  4. HTN   Signed, Azalee Course PA-C Pager: [phone]   The patient was seen and examined, and I agree with the physical exam, assessment and plan as documented above which has been discussed with Harrell Lark, with modifications as noted below. Pt doing well. INR decreasing. No bleeding problems.  Discharge to home today.  Prentice Docker, MD, [facility]  11/09/2015 9:30 AM

## 2015-11-09 NOTE — Progress Notes (Signed)
3W 09 pt was discharge, patient was given education on up coming appointments along with numbers if he didn't receive call within 2 business days. Pt was also educated on medication and how to take them and when and the changes that were made to some.  All information was highlighted, explained and handed to pt by nurse.

## 2015-11-09 NOTE — Discharge Summary (Signed)
Discharge Summary    Patient ID: Benjamin [name],  MRN: [id number], DOB/AGE: [id number] y.o.  Admit date: 11/03/2015 Discharge date: 11/09/2015  Primary Care Provider: Jeanann Lewandowsky Primary Cardiologist: Dr. Gala Romney  Primary electrophysiologist: Dr. Ladona Ridgel  Discharge Diagnoses    Principal Problem:   NSTEMI (non-ST elevated myocardial infarction) [facility]) Active Problems:   Essential hypertension   Hyperlipidemia with target LDL less than 100   Chronic systolic congestive heart failure (HCC)   Coronary artery disease involving native coronary artery of native heart without angina pectoris   Atrial flutter with rapid ventricular response (HCC)   Cardiomyopathy, ischemic   Allergies Allergies  Allergen Reactions  . Heparin Other (See Comments)    Will not use pork-derived products due to religious beliefs   . Lovenox [Enoxaparin Sodium] Other (See Comments)    Will not use pork-derived products due to religious beliefs   . Pork-Derived Products     Does not eat pork or pork derived products d/t religious reasons    Diagnostic Studies/Procedures     Cardiac cath 11/05/2015 Conclusion    1. Prox RCA to Mid RCA lesion, 20% stenosed. 2. RPDA lesion, 90% stenosed. 3. 1st Diag lesion, 100% stenosed. 4. Dist LAD lesion, 90% stenosed. 5. Ost Cx lesion, 60% stenosed. 6. 2nd Mrg lesion, 95% stenosed. 7. Prox Cx to Mid Cx lesion, 60% stenosed. 8. Mid LAD lesion, 95% stenosed. Post intervention, there is a 0% residual stenosis. 9. Dist RCA lesion, 90% stenosed. Post intervention, there is a 15% residual stenosis.   CHIP due to heavy coronary calcification, and severe left ventricular dysfunction, EF less than 20% and LVEDP 34 mmHg.  Successful rotational atherectomy and stenting of the mid LAD with reduction in a 95% stenosis to 0% with TIMI grade 3 flow  Successful angioplasty and stenting of a segmental, distal RCA 50-95% stenosis to less than 20% with TIMI grade 3  flow. The very distal right coronary was treated with angioplasty only.  Severe ischemic cardiomyopathy with LVEF less than 20% and end-diastolic pressure of 34 mmHg prior to insertion of Impella.  Successful removal of the Imella in the cath lab.   RECOMMENDATIONS:   14 French left femoral sheath to be removed when ACT is appropriately low  P2 Y 12 assay  Aspirin and Plavix.  Anticoagulation therapy as chosen by the treating team for the patient's atrial flutter and high CHADS Vasc score. I would recommend Coumadin.  Continue aspirin, Plavix, and Coumadin for 1 month and then drop aspirin.  Resume heart failure therapy      LE arterial doppler w/ ABI 11/04/2015 Summary: ABIs and Doppler waveforms are within normal limits bilaterally at [place].  Bilateral - Mild heterogenous plaque noted throughout with no evidence of significant arterial stenosis. All Doppler waveforms were triphasic except for the left pedal waveforms which were Biphasic.    _____________   History of Present Illness     55 yo with history of diffuse CAD, HTN, chronic systolic CHF/ischemic cardiomyopathy, and diabetes presented to ER with tachycardia and chest pressure.   Admitted in early March with abdominal pain and increased SOB with exertion. Cardiology consulted for surgical clearance. Due to many risk factors he was sent for [facility]. LHC showed complex, calcified, multivessel coronary disease with high-grade obstruction in the mid LAD and distal RCA. He had laparoscopic cholecystectomy. Saw Dr Katrinka Blazing for interventional consideration, decided to get thallium viability study. This showed large lateral/inferolateral infarct base-apex and apical infarct. Decision  has not yet been made on CHIP intervention on LAD and RCA.  At baseline, patient is short of breath after walking 100-200 feet and does not get chest pain. Today around lunchtime, he felt his heart start racing. Pulse was in the 130s. He  developed central chest pressure. No chest pain that he can remember prior to today. HR continued to be high and he continued to have chest pressure, so came to the ER. In the ER, HR up to 130s. He was noted to be in what appeared to be atypical atrial flutter. He was started on amiodarone gtt and HR decreased to the 80s-90s. Chest pressure improved but did not totally resolve. TnI was mildly elevated at 0.07.   PMH: 1. Chronic systolic CHF: Ischemic cardiomyopathy. - St Jude BiV ICD 10/16.  - Echo (8/16) with EF 20%, diffuse hypokinesis, mild MR.  2. HTN 3. Type II diabetes 4. Hyperlipidemia 5. CAD: LHC (3/17) with 90% distal RCA, 90% PDA, 100% D1, 95% mLAD, 90% dLAD, OM2 95%, ostial LCx 60%, mid LCx 60%. Diffuse distal disease, thought to be poor CABG candidate. Saw Dr. Katrinka Blazing for consideration of CHIP LAD and RCA. - Thallium viability scan (3/17): Large lateral and inferolateral infarct base-apex, apical infarct.  6. GERD 7. Laparoscopic cholecystectomy in 3/17.   Hospital Course     Given elevated creatinine, the patient underwent a catheterization on 11/05/2015 20% prox RCA, 90% RPDA, 100% diag, 90% distal LAD, 60% Ost LCx, 95% OM2, 60% prox LCx, 95% mid LAD treated with DES, 90% distal RCA treated with DES. EF < 20%, LVEDP 34 mmHg prior to insertion of Impella. He required temporary Impala during the procedure however Impala was removed in the cath lab after the procedure without complication.As for his atrial flutter, he was placed on IV amiodarone and has been maintaining normal sinus rhythm since. Given his elevated CHA2DS2-Vasc score 4 (HTN, HF, CAD, DM), he was started on Coumadin as well. He did have some degree of acute heart failure in the setting of A. fib with RVR and NSTEMI. He was placed on IV Lasix with good urinary output. Postprocedural hospitalization complicated by intermittent atrial fibrillation, his amiodarone was increased to 400 mg twice a day with plan of  continue this dose for 7 days before titrated down to 200 mg daily thereafter.  He was seen in the morning of 11/08/2015, his INR has significantly jumped from 1.8 on the previous day up to 3.3. After discussing with the pharmacist, it was felt that the rise in the INR is likely related to the amiodarone. Pharmacy has recommended holding the Coumadin for 2 days on 4/8 and 4/9 and restart the Coumadin at reduced dose 2.5 mg daily on 4/10. He has a follow-up with heart failure clinic in one month, he will need closer follow-up. I will arrange the patient to have Coumadin clinic visit on either next Tuesday or Wednesday and also basic metabolic panel in 7 days along with 7-10 days heart failure clinic follow-up. He is now on triple therapy after the new stent on aspirin, Plavix and Coumadin. I have instructed him to monitor for any sign of bleeding on triple therapy. The plan is to stop the aspirin after one month and continue on Plavix and Coumadin thereafter. He was seen in the morning of 11/09/2015, at which time he denies any significant chest discomfort or shortness breath, he has no sign of heart failure on exam, he is deemed stable for discharge from cardiology perspective.  Lasix has been changed to 60 mg by mouth daily. I will ask the patient to monitor his daily weight and to call cardiology if weight increased by more than 3 pounds overnight or 5 pounds in a single week. Patient has also been instructed to avoid salt and limit daily amount of fluid intake to less than 2 L per day.     _____________  Discharge Vitals Blood pressure 123/77, pulse 60, temperature 98.5 F (36.9 C), temperature source Oral, resp. rate 18, height 5\' 7"  (1.702 m), weight 224 lb 8 oz (101.833 kg), SpO2 96 %.  Filed Weights   11/07/15 0304 11/08/15 0556 11/09/15 0612  Weight: 229 lb (103.874 kg) 227 lb 8 oz (103.193 kg) 224 lb 8 oz (101.833 kg)    Labs & Radiologic Studies     CBC  Recent Labs  11/08/15 0555  11/09/15 0528  WBC 8.5 8.3  HGB 12.7* 12.7*  HCT 39.2 39.8  MCV 76.3* 76.5*  PLT 132* 133*   Basic Metabolic Panel  Recent Labs  11/07/15 0300 11/08/15 0555  NA 138 140  K 3.8 3.5  CL 100* 104  CO2 27 24  GLUCOSE 104* 111*  BUN 22* 19  CREATININE 1.13 0.87  CALCIUM 8.8* 8.8*    Dg Chest 2 View  11/03/2015  CLINICAL DATA:  Chest pain, hypertension, tachycardia. EXAM: CHEST  2 VIEW COMPARISON:  10/07/2015 FINDINGS: There is unchanged moderate cardiomegaly. The transvenous biventricular pacing leads appear grossly intact. The right upper extremity PICC line has been removed. The lungs are clear. There is no pleural effusion. The pulmonary vasculature is normal. Hilar and mediastinal contours are unremarkable and unchanged. IMPRESSION: Cardiomegaly.  No acute cardiopulmonary findings. Electronically Signed   By: Ellery Plunk M.D.   On: 11/03/2015 20:26    Disposition   Pt is being discharged home today in good condition.  Follow-up Plans & Appointments    Follow-up Information    Follow up with [facility] On 11/18/2015.   Why:  Transitional Care Clinic appointment on 11/18/15 at 2:00 pm with Dr. Venetia Night.   Contact information:   [address] [place] [address] [phone]      Follow up with [facility].   Specialty:  Cardiology   Why:  our office scheduler will contact you to arrange followup in 7-10 days with heart failure clinic, if you do not hear from [place] in 2 business days, please give [place] a call   Contact information:   [address] [id number] mc [place] [address] [phone]      Follow up with [facility].   Specialty:  Cardiology   Why:  our scheduler will contact you to arrange outpatient coumadin clinic visit on 4/11 or 4/12, you will also need 1 week BMET lab to check kidney function and potassium after increasing  lasix   Contact information:   [address] [place] [address] [phone]     Discharge Instructions    Amb Referral to Cardiac Rehabilitation    Complete by:  As directed   Diagnosis:   NSTEMI Coronary Stents       Diet - low sodium heart healthy    Complete by:  As directed      Increase activity slowly    Complete by:  As directed            Discharge  Medications   Current Discharge Medication List    START taking these medications   Details  amiodarone (PACERONE) 200 MG tablet Take 2 tablets twice a day until 4/13, starting 4/14 take 1 tablet daily Qty: 45 tablet, Refills: 5    clopidogrel (PLAVIX) 75 MG tablet Take 1 tablet (75 mg total) by mouth daily. Qty: 90 tablet, Refills: 3    losartan (COZAAR) 25 MG tablet Take 1 tablet (25 mg total) by mouth 2 (two) times daily. Qty: 90 tablet, Refills: 3    nitroGLYCERIN (NITROSTAT) 0.4 MG SL tablet Place 1 tablet (0.4 mg total) under the tongue every 5 (five) minutes x 3 doses as needed for chest pain. Qty: 25 tablet, Refills: 3    potassium chloride 20 MEQ TBCR Take 20 mEq by mouth daily. Qty: 30 tablet, Refills: 3    warfarin (COUMADIN) 2.5 MG tablet Take 1 tablet (2.5 mg total) by mouth daily. Qty: 30 tablet, Refills: 5      CONTINUE these medications which have CHANGED   Details  carvedilol (COREG) 25 MG tablet Take 0.5 tablets (12.5 mg total) by mouth 2 (two) times daily with a meal. Qty: 30 tablet, Refills: 11    furosemide (LASIX) 20 MG tablet Take 3 tablets (60 mg total) by mouth daily. Qty: 30 tablet, Refills: 3      CONTINUE these medications which have NOT CHANGED   Details  aspirin EC 81 MG tablet Take 81 mg by mouth daily.    digoxin (LANOXIN) 0.125 MG tablet Take 1 tablet (0.125 mg total) by mouth daily. Qty: 30 tablet, Refills: 0    insulin aspart (NOVOLOG FLEXPEN) 100 UNIT/ML FlexPen Inject 10 Units into the skin 2 (two) times daily with a meal.    insulin  glargine (LANTUS) 100 UNIT/ML injection Inject 30 Units into the skin at bedtime.     metFORMIN (GLUCOPHAGE) 1000 MG tablet Take 1,000 mg by mouth daily with breakfast.    pregabalin (LYRICA) 75 MG capsule Take 150 mg by mouth at bedtime.    rosuvastatin (CRESTOR) 20 MG tablet Take 1 tablet (20 mg total) by mouth daily. Qty: 90 tablet, Refills: 3      STOP taking these medications     olmesartan (BENICAR) 20 MG tablet          Aspirin prescribed at discharge?  Yes High Intensity Statin Prescribed? (Lipitor 40-80mg  or Crestor 20-40mg ): Yes Beta Blocker Prescribed? Yes For EF 45% or less, Was ACEI/ARB Prescribed? Yes ADP Receptor Inhibitor Prescribed? (i.e. Plavix etc.-Includes Medically Managed Patients): Yes For EF <40%, Aldosterone Inhibitor Prescribed? No: BP low Was EF assessed during THIS hospitalization? Yes Was Cardiac Rehab II ordered? (Included Medically managed Patients): Yes   Outstanding Labs/Studies   BMET in 7 days, Coumadin clinic Next Tue or Wed   Duration of Discharge Encounter   Greater than 30 minutes including physician time.  Ramond Dial PA-C 11/09/2015, 12:47 PM

## 2015-11-10 ENCOUNTER — Telehealth: Payer: Self-pay

## 2015-11-10 MED FILL — AMIODARONE HCL 200 MG TAB: 200 | 30 days supply | Qty: 45 | Fill #0

## 2015-11-10 MED FILL — ?CARVEDILOL 25 MG TABLET: 25 | 30 days supply | Qty: 30 | Fill #0

## 2015-11-10 MED FILL — CLOPIDOGREL 75 MG TABLET: 75 | 30 days supply | Qty: 30 | Fill #0

## 2015-11-10 MED FILL — LOSARTAN POTASSIUM 25 MG TA: 25 | 30 days supply | Qty: 60 | Fill #0

## 2015-11-10 MED FILL — WARFARIN NA 2.5 MG TAB: 2.5 | 30 days supply | Qty: 30 | Fill #0

## 2015-11-10 MED FILL — NITROGLYCERIN 0.4 MG TAB SL: 0.4 MG | 7 days supply | Qty: 25 | Fill #0

## 2015-11-10 MED FILL — POTASSIUM CL ER 20 MEQ TAB: 20 | 30 days supply | Qty: 30 | Fill #0

## 2015-11-10 MED FILL — FUROSEMIDE 20 MG TABLET: 20 | 10 days supply | Qty: 30 | Fill #0

## 2015-11-10 NOTE — Telephone Encounter (Signed)
Transitional Care Clinic Post-discharge Follow-Up Phone Call:  Date of Discharge:11/09/2015 Principal Discharge Diagnosis(es): NSTEMI, chronic systolic CHF, CAD, Atrial flutter with RVR, DM Post-discharge Communication: Call placed to the patient Call Completed: Yes                   With Whom: patient Interpreter Needed: No     Please check all that apply:  X  Patient is knowledgeable of his/her condition(s) and/or treatment. X  Patient is caring for self at home. He also stated that he is driving.  ? Patient is receiving assist at home from family and/or caregiver. Family and/or caregiver is knowledgeable of patient's condition(s) and/or treatment. ? Patient is receiving home health services. If so, name of agency.     Medication Reconciliation:  ? Medication list reviewed with patient. X  Patient obtained all discharge medications. If not, why? NO - He was discharged yesterday is plans to pick up his prescriptions at [facility] pharmacy today. He noted that he was at the pharmacy when this CM was calling and he is going back to the pharmacy in 1 hour to pick up the medications. He is aware that he has multiple new medications and some changes with the medications that he was taking prior to his hospitalization. He was driving and could not review the medication list at this time.  He understands that he is on coumadin and will start taking it tonight and will need to have INRs done on a regular basis.  He reported no signs of bleeding. He stated that his INR was a little "high' when he was leaving the hospital. He said the he has a glucometer and checks his blood sugar in the morning when he gets up and then before he goes to sleep. He stated that his blood sugar today was " 140's."  Instructed him to keep a log of the blood sugars and bring the log to his appointment with Dr Venetia Night on 11/18/15. Also inquired if he has been weighing himself. He said that he has but he could use a new scale. He said  that his weight this morning was 224 lbs and it was 226 lbs in the hospital yesterday. Also instructed him to keep a log of his weights.      Activities of Daily Living:  X  Independent ? Needs assist (describe; ? home DME used) ? [facility] (describe, ? home DME used)   Community resources in place for patient:  X  None  ? Home Health/Home DME ? Assisted Living ? Support Group          Patient Education: Instructed him about the importance of avoiding salt and limiting his fluid intake to less than 2L/day and he stated that he understood.         Questions/Concerns discussed: He said that he is feeling " fine."  He noted that he was just discharged from the hospital yesterday and knows that he has upcoming appointments but was not sure when/where but he would review his discharge paperwork. His appointment at [facility] is 11/18/15 @ 1400. No other problems/ questions reported

## 2015-11-11 ENCOUNTER — Telehealth: Payer: Self-pay

## 2015-11-11 MED FILL — FUROSEMIDE 20 MG TABLET: 20 | 30 days supply | Qty: 90 | Fill #1

## 2015-11-11 NOTE — Telephone Encounter (Signed)
Met with the patient when he came to the clinic today and informed him that the clinic is still working on getting him a scale.

## 2015-11-11 NOTE — Telephone Encounter (Signed)
Call placed to Benjamin [name], [facility] liaison, # [phone] to inquire if the patient would be eligible for the disease management program at home that would include monitoring his weight.  Voicemail message left requesting a call back to # [phone].

## 2015-11-13 ENCOUNTER — Encounter: Payer: Self-pay | Admitting: Internal Medicine

## 2015-11-13 ENCOUNTER — Ambulatory Visit (INDEPENDENT_AMBULATORY_CARE_PROVIDER_SITE_OTHER): Payer: No Typology Code available for payment source | Admitting: Internal Medicine

## 2015-11-13 ENCOUNTER — Ambulatory Visit (INDEPENDENT_AMBULATORY_CARE_PROVIDER_SITE_OTHER): Payer: No Typology Code available for payment source | Admitting: *Deleted

## 2015-11-13 VITALS — BP 118/86 | HR 66 | Ht 67.0 in | Wt 228.6 lb

## 2015-11-13 DIAGNOSIS — I4892 Unspecified atrial flutter: Secondary | ICD-10-CM

## 2015-11-13 DIAGNOSIS — I255 Ischemic cardiomyopathy: Secondary | ICD-10-CM

## 2015-11-13 DIAGNOSIS — I5022 Chronic systolic (congestive) heart failure: Secondary | ICD-10-CM

## 2015-11-13 LAB — BASIC METABOLIC PANEL
BUN: 22 mg/dL (ref 7–25)
CALCIUM: 9.4 mg/dL (ref 8.6–10.3)
CO2: 25 mmol/L (ref 20–31)
CREATININE: 1.16 mg/dL (ref 0.70–1.33)
Chloride: 102 mmol/L (ref 98–110)
Glucose, Bld: 132 mg/dL — ABNORMAL HIGH (ref 65–99)
Potassium: 4.9 mmol/L (ref 3.5–5.3)
Sodium: 139 mmol/L (ref 135–146)

## 2015-11-13 LAB — POCT INR: INR: 3.5

## 2015-11-13 NOTE — Progress Notes (Signed)
HPI [name] returns for followup. He is a pleasant 55 yo man with chronic systolic heart failure and LBBB, s/p BiV ICD implant. In the interim, he has been found to have 3 vessel CAD and has undergone complex intervention by Dr. Katrinka Blazing. No ICD shocks. He feels better.  Allergies  Allergen Reactions  . Heparin Other (See Comments)    Will not use pork-derived products due to religious beliefs   . Lovenox [Enoxaparin Sodium] Other (See Comments)    Will not use pork-derived products due to religious beliefs   . Pork-Derived Products     Does not eat pork or pork derived products d/t religious reasons     Current Outpatient Prescriptions  Medication Sig Dispense Refill  . amiodarone (PACERONE) 200 MG tablet Take 2 tablets twice a day until 4/13, starting 4/14 take 1 tablet daily 45 tablet 5  . aspirin EC 81 MG tablet Take 81 mg by mouth daily.    . carvedilol (COREG) 25 MG tablet Take 0.5 tablets (12.5 mg total) by mouth 2 (two) times daily with a meal. 30 tablet 11  . clopidogrel (PLAVIX) 75 MG tablet Take 1 tablet (75 mg total) by mouth daily. 90 tablet 3  . digoxin (LANOXIN) 0.125 MG tablet Take 1 tablet (0.125 mg total) by mouth daily. 30 tablet 0  . furosemide (LASIX) 20 MG tablet Take 3 tablets (60 mg total) by mouth daily. 30 tablet 3  . insulin aspart (NOVOLOG FLEXPEN) 100 UNIT/ML FlexPen Inject 10 Units into the skin 2 (two) times daily with a meal.    . insulin glargine (LANTUS) 100 UNIT/ML injection Inject 30 Units into the skin at bedtime.     Marland Kitchen losartan (COZAAR) 25 MG tablet Take 1 tablet (25 mg total) by mouth 2 (two) times daily. 90 tablet 3  . metFORMIN (GLUCOPHAGE) 1000 MG tablet Take 1,000 mg by mouth daily with breakfast.    . nitroGLYCERIN (NITROSTAT) 0.4 MG SL tablet Place 1 tablet (0.4 mg total) under the tongue every 5 (five) minutes x 3 doses as needed for chest pain. 25 tablet 3  . oxyCODONE-acetaminophen (PERCOCET/ROXICET) 5-325 MG tablet Take 1 tablet by  mouth as directed.  0  . potassium chloride 20 MEQ TBCR Take 20 mEq by mouth daily. 30 tablet 3  . pregabalin (LYRICA) 75 MG capsule Take 150 mg by mouth at bedtime.    . rosuvastatin (CRESTOR) 20 MG tablet Take 1 tablet (20 mg total) by mouth daily. 90 tablet 3  . warfarin (COUMADIN) 2.5 MG tablet Take 1 tablet (2.5 mg total) by mouth daily. 30 tablet 5   No current facility-administered medications for this visit.     Past Medical History  Diagnosis Date  . Hypertension   . High cholesterol   . Cholelithiasis   . Chronic systolic heart failure (HCC)   . AICD (automatic cardioverter/defibrillator) present   . Kidney stone   . Pneumonia 2002  . Type II diabetes mellitus (HCC)   . GERD (gastroesophageal reflux disease)   . Coronary artery disease     distal vessel disease    ROS:   All systems reviewed and negative except as noted in the HPI.   Past Surgical History  Procedure Laterality Date  . Colonoscopy N/A 07/12/2013    Procedure: COLONOSCOPY;  Surgeon: Benjamin Friar, MD;  Location: [facility] ENDOSCOPY;  Service: Endoscopy;  Laterality: N/A;  . Ep implantable device N/A 05/09/2015    Procedure: BiV ICD  Insertion CRT-D;  Surgeon: Marinus Maw, MD;  Location: [facility] INVASIVE CV LAB;  Service: Cardiovascular;  Laterality: N/A;  . Cholecystectomy  10/09/2015    Procedure: LAPAROSCOPIC CHOLECYSTECTOMY;  Surgeon: Claud Kelp, MD;  Location: [facility] OR;  Service: General;;  . Cardiac catheterization N/A 10/07/2015    Procedure: Left Heart Cath and Coronary Angiography;  Surgeon: Lyn Records, MD;  Location: [facility] INVASIVE CV LAB;  Service: Cardiovascular;  Laterality: N/A;  . Cardiac catheterization N/A 11/05/2015    Procedure: Coronary Stent Intervention Rotablater;  Surgeon: Lyn Records, MD;  Location: [facility] INVASIVE CV LAB;  Service: Cardiovascular;  Laterality: N/A;     Family History  Problem Relation Age of Onset  . Diabetes Mother   . Hypertension Mother   . Hyperlipidemia  Mother   . Stroke Mother   . Diabetes Father   . Hypertension Father   . Stroke Father 46     Social History   Social History  . Marital Status: Married    Spouse Name: N/A  . Number of Children: N/A  . Years of Education: N/A   Occupational History  . Not on file.   Social History Main Topics  . Smoking status: Never Smoker   . Smokeless tobacco: Never Used  . Alcohol Use: No  . Drug Use: No  . Sexual Activity: No   Other Topics Concern  . Not on file   Social History Narrative     BP 118/86 mmHg  Pulse 66  Ht 5\' 7"  (1.702 m)  Wt 228 lb 9.6 oz (103.692 kg)  BMI 35.80 kg/m2  SpO2 97%  Physical Exam:  Well appearing 55 yo man, obese, NAD HEENT: Unremarkable Neck:  6 cm JVD, no thyromegally Lymphatics:  No adenopathy Back:  No CVA tenderness Lungs:  Clear with no wheezes HEART:  Regular rate rhythm, no murmurs, no rubs, no clicks Abd:  soft, positive bowel sounds, no organomegally, no rebound, no guarding Ext:  2 plus pulses, no edema, no cyanosis, no clubbing Skin:  No rashes no nodules Neuro:  CN II through XII intact, motor grossly intact  EKG - nsr with LBBB/IVCD ICD check - normal device (St.Jude BiV ICD) function.  Assess/Plan: 1. CAD - he is much improved after complex PCI.  2. Chronic systolic heart failure - his symptoms have gone from class 3 to class 2. We will plan to repeat his 2D echo in 3 months.  3. ICD - his St. Jude BiV ICD is working normally. Will recheck in several months 4. Obesity - again we discussed the importance of weight loss. He has lost about 10 lbs.  Leonia Reeves.D.

## 2015-11-13 NOTE — Patient Instructions (Signed)
Medication Instructions:  Your physician recommends that you continue on your current medications as directed. Please refer to the Current Medication list given to you today.   Labwork: Your physician recommends that you return for lab work today: BMP    Testing/Procedures:  Your physician has requested that you have an echocardiogram. Echocardiography is a painless test that uses sound waves to create images of your heart. It provides your doctor with information about the size and shape of your heart and how well your heart's chambers and valves are working. This procedure takes approximately one hour. There are no restrictions for this procedure.     Follow-Up: Your physician recommends that you schedule a follow-up appointment in: 3 months with Dr Ladona Ridgel after the echo     Any Other Special Instructions Will Be Listed Below (If Applicable).     If you need a refill on your cardiac medications before your next appointment, please call your pharmacy.

## 2015-11-13 NOTE — Patient Instructions (Signed)

## 2015-11-15 ENCOUNTER — Emergency Department (HOSPITAL_COMMUNITY): Payer: Medicaid Other

## 2015-11-15 ENCOUNTER — Emergency Department (HOSPITAL_COMMUNITY)
Admission: EM | Admit: 2015-11-15 | Discharge: 2015-11-15 | Disposition: A | Discharge status: Home / Self Care | Payer: Medicaid Other | Attending: Emergency Medicine | Admitting: Emergency Medicine

## 2015-11-15 ENCOUNTER — Encounter (HOSPITAL_COMMUNITY): Payer: Self-pay | Admitting: *Deleted

## 2015-11-15 DIAGNOSIS — Z95810 Presence of automatic (implantable) cardiac defibrillator: Secondary | ICD-10-CM | POA: Diagnosis not present

## 2015-11-15 DIAGNOSIS — Z7984 Long term (current) use of oral hypoglycemic drugs: Secondary | ICD-10-CM | POA: Diagnosis not present

## 2015-11-15 DIAGNOSIS — Z7902 Long term (current) use of antithrombotics/antiplatelets: Secondary | ICD-10-CM | POA: Diagnosis not present

## 2015-11-15 DIAGNOSIS — Z79899 Other long term (current) drug therapy: Secondary | ICD-10-CM | POA: Diagnosis not present

## 2015-11-15 DIAGNOSIS — Z794 Long term (current) use of insulin: Secondary | ICD-10-CM | POA: Insufficient documentation

## 2015-11-15 DIAGNOSIS — E7800 Pure hypercholesterolemia, unspecified: Secondary | ICD-10-CM | POA: Insufficient documentation

## 2015-11-15 DIAGNOSIS — Z8719 Personal history of other diseases of the digestive system: Secondary | ICD-10-CM | POA: Diagnosis not present

## 2015-11-15 DIAGNOSIS — Z9889 Other specified postprocedural states: Secondary | ICD-10-CM | POA: Insufficient documentation

## 2015-11-15 DIAGNOSIS — R079 Chest pain, unspecified: Secondary | ICD-10-CM | POA: Diagnosis present

## 2015-11-15 DIAGNOSIS — R7989 Other specified abnormal findings of blood chemistry: Secondary | ICD-10-CM | POA: Diagnosis not present

## 2015-11-15 DIAGNOSIS — I10 Essential (primary) hypertension: Secondary | ICD-10-CM | POA: Diagnosis not present

## 2015-11-15 DIAGNOSIS — E119 Type 2 diabetes mellitus without complications: Secondary | ICD-10-CM | POA: Diagnosis not present

## 2015-11-15 DIAGNOSIS — Z8701 Personal history of pneumonia (recurrent): Secondary | ICD-10-CM | POA: Insufficient documentation

## 2015-11-15 DIAGNOSIS — Z87442 Personal history of urinary calculi: Secondary | ICD-10-CM | POA: Diagnosis not present

## 2015-11-15 DIAGNOSIS — Z7901 Long term (current) use of anticoagulants: Secondary | ICD-10-CM | POA: Insufficient documentation

## 2015-11-15 DIAGNOSIS — Z7982 Long term (current) use of aspirin: Secondary | ICD-10-CM | POA: Diagnosis not present

## 2015-11-15 DIAGNOSIS — I2510 Atherosclerotic heart disease of native coronary artery without angina pectoris: Secondary | ICD-10-CM | POA: Insufficient documentation

## 2015-11-15 DIAGNOSIS — I483 Typical atrial flutter: Secondary | ICD-10-CM | POA: Diagnosis not present

## 2015-11-15 DIAGNOSIS — R42 Dizziness and giddiness: Secondary | ICD-10-CM

## 2015-11-15 DIAGNOSIS — I5022 Chronic systolic (congestive) heart failure: Secondary | ICD-10-CM | POA: Diagnosis not present

## 2015-11-15 LAB — PROTIME-INR
INR: 2.5 — AB (ref 0.00–1.49)
Prothrombin Time: 26.7 seconds — ABNORMAL HIGH (ref 11.6–15.2)

## 2015-11-15 LAB — BASIC METABOLIC PANEL
ANION GAP: 11 (ref 5–15)
BUN: 22 mg/dL — AB (ref 6–20)
CALCIUM: 9.2 mg/dL (ref 8.9–10.3)
CO2: 25 mmol/L (ref 22–32)
CREATININE: 1.49 mg/dL — AB (ref 0.61–1.24)
Chloride: 103 mmol/L (ref 101–111)
GFR calc Af Amer: >60 mL/min (ref >60)
GFR, EST NON AFRICAN AMERICAN: 52 mL/min — AB (ref >60)
GLUCOSE: 174 mg/dL — AB (ref 65–99)
Potassium: 4.4 mmol/L (ref 3.5–5.1)
Sodium: 139 mmol/L (ref 135–145)

## 2015-11-15 LAB — CBC
HCT: 42.5 % (ref 39.0–52.0)
HEMOGLOBIN: 14.1 g/dL (ref 13.0–17.0)
MCH: 25.5 pg — AB (ref 26.0–34.0)
MCHC: 33.2 g/dL (ref 30.0–36.0)
MCV: 76.7 fL — ABNORMAL LOW (ref 78.0–100.0)
Platelets: 168 10*3/uL (ref 150–400)
RBC: 5.54 MIL/uL (ref 4.22–5.81)
RDW: 17.2 % — ABNORMAL HIGH (ref 11.5–15.5)
WBC: 7.6 10*3/uL (ref 4.0–10.5)

## 2015-11-15 LAB — I-STAT TROPONIN, ED
Troponin i, poc: 0.06 ng/mL (ref 0.00–0.08)
Troponin i, poc: 0.04 ng/mL (ref 0.00–0.08)

## 2015-11-15 NOTE — ED Provider Notes (Signed)
CSN: [id number]     Arrival date & time 11/15/15  1417 History   First MD Initiated Contact with Patient 11/15/15 1514     Chief Complaint  Patient presents with  . Fatigue  . Chest Pain     (Consider location/radiation/quality/duration/timing/severity/associated sxs/prior Treatment) HPI Comments: Patient is a 55 year old male with history of recent cardiac catheterization and stent placement one week ago who presents with lightheadedness and irregular heart rate. Patient stated he normally takes 1 Amaryl and took 2 Amaryl this morning because his heart rate was elevated. Patient took all his other medicines today with the exception of Lasix and digoxin. Patient states his heart rate was in the 130s earlier this morning. Patient also states that his blood pressure has been up and down, as low as 80/58. Patient is reporting episodes of lightheadedness that cause him to have difficulty walking. Patient denies chest pain, shortness of breath, abdominal pain, nausea, vomiting, dysuria, headaches. Patient was discharged 2 days ago following cardiac catheterization and dual stent placement. Also treated with Coumadin for atrial flutter with RVR since 4/5.  Patient is a 55 y.o. male presenting with chest pain. The history is provided by the patient.  Chest Pain Associated symptoms: no abdominal pain, no back pain, no fever, no headache, no nausea, no shortness of breath and not vomiting     Past Medical History  Diagnosis Date  . Hypertension   . High cholesterol   . Cholelithiasis   . Chronic systolic heart failure (HCC)   . AICD (automatic cardioverter/defibrillator) present   . Kidney stone   . Pneumonia 2002  . Type II diabetes mellitus (HCC)   . GERD (gastroesophageal reflux disease)   . Coronary artery disease     distal vessel disease   Past Surgical History  Procedure Laterality Date  . Colonoscopy N/A 07/12/2013    Procedure: COLONOSCOPY;  Surgeon: Shirley Friar, MD;   Location: [facility] ENDOSCOPY;  Service: Endoscopy;  Laterality: N/A;  . Ep implantable device N/A 05/09/2015    Procedure: BiV ICD Insertion CRT-D;  Surgeon: Marinus Maw, MD;  Location: [facility] INVASIVE CV LAB;  Service: Cardiovascular;  Laterality: N/A;  . Cholecystectomy  10/09/2015    Procedure: LAPAROSCOPIC CHOLECYSTECTOMY;  Surgeon: Claud Kelp, MD;  Location: [facility] OR;  Service: General;;  . Cardiac catheterization N/A 10/07/2015    Procedure: Left Heart Cath and Coronary Angiography;  Surgeon: Lyn Records, MD;  Location: [facility] INVASIVE CV LAB;  Service: Cardiovascular;  Laterality: N/A;  . Cardiac catheterization N/A 11/05/2015    Procedure: Coronary Stent Intervention Rotablater;  Surgeon: Lyn Records, MD;  Location: [facility] INVASIVE CV LAB;  Service: Cardiovascular;  Laterality: N/A;   Family History  Problem Relation Age of Onset  . Diabetes Mother   . Hypertension Mother   . Hyperlipidemia Mother   . Stroke Mother   . Diabetes Father   . Hypertension Father   . Stroke Father 72   Social History  Substance Use Topics  . Smoking status: Never Smoker   . Smokeless tobacco: Never Used  . Alcohol Use: No    Review of Systems  Constitutional: Negative for fever and chills.  HENT: Negative for facial swelling and sore throat.   Respiratory: Negative for shortness of breath.   Cardiovascular: Negative for chest pain.  Gastrointestinal: Negative for nausea, vomiting and abdominal pain.  Genitourinary: Negative for dysuria.  Musculoskeletal: Negative for back pain.  Skin: Negative for rash and wound.  Neurological: Positive for  light-headedness. Negative for headaches.  Psychiatric/Behavioral: The patient is not nervous/anxious.       Allergies  Heparin; Lovenox; and Pork-derived products  Home Medications   Prior to Admission medications   Medication Sig Start Date End Date Taking? Authorizing Provider  amiodarone (PACERONE) 200 MG tablet Take 2 tablets twice a day until 4/13,  starting 4/14 take 1 tablet daily Patient taking differently: Take 200 mg by mouth daily.  11/09/15  Yes Azalee Course, PA  aspirin EC 81 MG tablet Take 81 mg by mouth daily.   Yes Historical Provider, MD  carvedilol (COREG) 25 MG tablet Take 0.5 tablets (12.5 mg total) by mouth 2 (two) times daily with a meal. 11/09/15  Yes Azalee Course, PA  clopidogrel (PLAVIX) 75 MG tablet Take 1 tablet (75 mg total) by mouth daily. 11/09/15  Yes Azalee Course, PA  digoxin (LANOXIN) 0.125 MG tablet Take 1 tablet (0.125 mg total) by mouth daily. 10/11/15  Yes Shanker Levora Dredge, MD  furosemide (LASIX) 20 MG tablet Take 3 tablets (60 mg total) by mouth daily. Patient taking differently: Take 60 mg by mouth daily at 3 pm.  11/10/15  Yes Azalee Course, PA  insulin aspart (NOVOLOG FLEXPEN) 100 UNIT/ML FlexPen Inject 5-10 Units into the skin 2 (two) times daily as needed for high blood sugar (CBG >140). Dose is based on size of meal   Yes Historical Provider, MD  insulin glargine (LANTUS) 100 unit/mL SOPN Inject 22-23 Units into the skin at bedtime.   Yes Historical Provider, MD  losartan (COZAAR) 25 MG tablet Take 1 tablet (25 mg total) by mouth 2 (two) times daily. 11/09/15  Yes Azalee Course, PA  metFORMIN (GLUCOPHAGE) 1000 MG tablet Take 1,000 mg by mouth daily with lunch.    Yes Historical Provider, MD  nitroGLYCERIN (NITROSTAT) 0.4 MG SL tablet Place 1 tablet (0.4 mg total) under the tongue every 5 (five) minutes x 3 doses as needed for chest pain. 11/09/15  Yes Azalee Course, PA  potassium chloride 20 MEQ TBCR Take 20 mEq by mouth daily. Patient taking differently: Take 20 mEq by mouth every other day.  11/09/15  Yes Azalee Course, PA  pregabalin (LYRICA) 75 MG capsule Take 150 mg by mouth at bedtime.   Yes Historical Provider, MD  rosuvastatin (CRESTOR) 20 MG tablet Take 1 tablet (20 mg total) by mouth daily. 08/22/15  Yes Quentin Angst, MD  warfarin (COUMADIN) 2.5 MG tablet Take 1 tablet (2.5 mg total) by mouth daily. 11/09/15  Yes Hao Meng, PA   BP  101/68 mmHg  Pulse 60  Temp(Src) 98.7 F (37.1 C) (Oral)  Resp 21  SpO2 96% Physical Exam  Constitutional: He appears well-developed and well-nourished. No distress.  HENT:  Head: Normocephalic and atraumatic.  Eyes: Conjunctivae are normal. Pupils are equal, round, and reactive to light. Right eye exhibits no discharge. Left eye exhibits no discharge. No scleral icterus.  Neck: Normal range of motion. Neck supple. No thyromegaly present.  Cardiovascular: Normal rate, normal heart sounds and intact distal pulses.  Exam reveals no gallop and no friction rub.   No murmur heard. Pulmonary/Chest: Effort normal and breath sounds normal. No stridor. No respiratory distress. He has no wheezes. He has no rales.  Abdominal: Soft. Bowel sounds are normal. He exhibits no distension. There is no tenderness. There is no rebound and no guarding.  Musculoskeletal: He exhibits no edema.  Lymphadenopathy:    He has no cervical adenopathy.  Neurological: He is alert. He  is not disoriented. Coordination normal.  CN 3-12 intact, normal sensation, 5/5 strength throughout  Skin: Skin is warm and dry. No rash noted. He is not diaphoretic. No pallor.  Psychiatric: He has a normal mood and affect.  Nursing note and vitals reviewed.   ED Course  Procedures (including critical care time) Labs Review Labs Reviewed  BASIC METABOLIC PANEL - Abnormal; Notable for the following:    Glucose, Bld 174 (*)    BUN 22 (*)    Creatinine, Ser 1.49 (*)    GFR calc non Af Amer 52 (*)    All other components within normal limits  CBC - Abnormal; Notable for the following:    MCV 76.7 (*)    MCH 25.5 (*)    RDW 17.2 (*)    All other components within normal limits  PROTIME-INR - Abnormal; Notable for the following:    Prothrombin Time 26.7 (*)    INR 2.50 (*)    All other components within normal limits  I-STAT TROPOININ, ED  Rosezena Sensor, ED    Imaging Review Dg Chest 2 View  11/15/2015  CLINICAL DATA:   Patient with chest tightness and pressure for 2 hours. Weakness and dizziness. EXAM: CHEST  2 VIEW COMPARISON:  Chest radiograph 11/03/2015 FINDINGS: Multi lead AICD device overlies the left hemi thorax, leads are stable in position. Stable cardiomegaly. No consolidative pulmonary opacities. No pleural effusion or pneumothorax. Cholecystectomy clips. Regional skeleton is unremarkable. IMPRESSION: No acute cardiopulmonary process. Cardiomegaly. Electronically Signed   By: Annia Belt M.D.   On: 11/15/2015 15:43   I have personally reviewed and evaluated these images and lab results as part of my medical decision-making.   EKG Interpretation   Date/Time:  Saturday November 15 2015 16:32:33 EDT Ventricular Rate:  60 PR Interval:  183 QRS Duration: 180 QT Interval:  530 QTC Calculation: 530 R Axis:   -103 Text Interpretation:  AV dual-paced rhythm No significant change since  last tracing Confirmed by [facility] MD, ERIN ([id number]) on 11/15/2015 7:58:49  PM      MDM   Suspect patient's symptoms due to increased Amaryl dose this morning, decreasing patient's blood pressure. Troponin 0.06. BMP shows BUN 22, creatinine 1.49, glucose 174. CBC unremarkable. INR 2.5, patient on Coumadin. Chest x-ray shows no acute cardiopulmonary process. EKG in triage showed probable atrial flutter. Repeat EKG shows Paced rhythm and no significant change since last tracing, ventricular rate 60. Patient's elevated creatinine probably due to dehydration. Will have patient follow up with PCP for recheck within the week. Patient's symptoms resolved at discharge. Patient advised not to take his medicines different than prescribed. Patient advised to call his doctor next time he feels similar symptoms. Patient seen by Dr. Dalene Seltzer who is in agreement with plan.   Final diagnoses:  Lightheadedness  Typical atrial flutter (HCC)  Elevated serum creatinine       Emi Holes, PA-C 11/15/15 2006  Alvira Monday,  MD 11/17/15 [phone]

## 2015-11-15 NOTE — Discharge Instructions (Signed)
Decrease your lasix to  per day for 2 days, then resume normal dosing. Have your kidney function rechecked by your PCP next week. Please call your doctor prior to changing any of your medication dosages in the future. Please return the emergency department if you develop any new or worsening symptoms, including chest pain or shortness of breath.   Atrial Flutter Atrial flutter is a heart rhythm that can cause the heart to beat very fast (tachycardia). It originates in the upper chambers of the heart (atria). In atrial flutter, the top chambers of the heart (atria) often beat much faster than the bottom chambers of the heart (ventricles). Atrial flutter has a regular "saw toothed" appearance in an EKG readout. An EKG is a test that records the electrical activity of the heart. Atrial flutter can cause the heart to beat up to 150 beats per minute (BPM). Atrial flutter can either be short lived (paroxysmal) or permanent.  CAUSES  Causes of atrial flutter can be many. Some of these include:  Heart related issues:  Heart attack (myocardial infarction).  Heart failure.  Heart valve problems.  Poorly controlled high blood pressure (hypertension).  After open heart surgery.  Lung related issues:  A blood clot in the lungs (pulmonary embolism).  Chronic obstructive pulmonary disease (COPD). Medications used to treat COPD can attribute to atrial flutter.  Other related causes:  Hyperthyroidism.  Caffeine.  Some decongestant cold medications.  Low electrolyte levels such as potassium or magnesium.  Cocaine. SYMPTOMS  An awareness of your heart beating rapidly (palpitations).  Shortness of breath.  Chest pain.  Low blood pressure (hypotension).  Dizziness or fainting. DIAGNOSIS  Different tests can be performed to diagnose atrial flutter.   An EKG.  Holter monitor. This is a 24-hour recording of your heart rhythm. You will also be given a diary. Write down all symptoms  that you have and what you were doing at the time you experienced symptoms.  Cardiac event monitor. This small device can be worn for up to 30 days. When you have heart symptoms, you will push a button on the device. This will then record your heart rhythm.  Echocardiogram. This is an imaging test to look at your heart. Your caregiver will look at your heart valves and the ventricles.  Stress test. This test can help determine if the atrial flutter is related to exercise or if coronary artery disease is present.  Laboratory studies will look at certain blood levels like:  Complete blood count (CBC).  Potassium.  Magnesium.  Thyroid function. TREATMENT  Treatment of atrial flutter varies. A combination of therapies may be used or sometimes atrial flutter may need only 1 type of treatment.  Lab work: If your blood work, such as your electrolytes (potassium, magnesium) or your thyroid function tests, are abnormal, your caregiver will treat them accordingly.  Medication:  There are several different types of medications that can convert your heart to a normal rhythm and prevent atrial flutter from reoccurring.  Nonsurgical procedures: Nonsurgical techniques may be used to control atrial flutter. Some examples include:  Cardioversion. This technique uses either drugs or an electrical shock to restore a normal heart rhythm:  Cardioversion drugs may be given through an intravenous (IV) line to help "reset" the heart rhythm.  In electrical cardioversion, your caregiver shocks your heart with electrical energy. This helps to reset the heartbeat to a normal rhythm.  Ablation. If atrial flutter is a persistent problem, an ablation may be needed. This  procedure is done under mild sedation. High frequency radio-wave energy is used to destroy the area of heart tissue responsible for atrial flutter. SEEK IMMEDIATE MEDICAL CARE IF:  You have:  Dizziness.  Near fainting or fainting.  Shortness  of breath.  Chest pain or pressure.  Sudden nausea or vomiting.  Profuse sweating. If you have the above symptoms, call your local emergency service immediately! Do not drive yourself to the hospital. MAKE SURE YOU:   Understand these instructions.  Will watch your condition.  Will get help right away if you are not doing well or get worse.   This information is not intended to replace advice given to you by your health care provider. Make sure you discuss any questions you have with your health care provider.   Document Released: 12/05/2008 Document Revised: 08/09/2014 Document Reviewed: 01/31/2015 Elsevier Interactive Patient Education [employer]

## 2015-11-15 NOTE — ED Notes (Signed)
Pt reports recent heart cath and stent placement. Took meds as prescribed this am and now onset of fatigue, feeling lightheaded and irregular HR.

## 2015-11-15 NOTE — ED Notes (Signed)
Added on PT/INR

## 2015-11-17 ENCOUNTER — Telehealth: Payer: Self-pay

## 2015-11-17 ENCOUNTER — Other Ambulatory Visit: Payer: Self-pay

## 2015-11-17 ENCOUNTER — Other Ambulatory Visit: Payer: Self-pay | Admitting: Internal Medicine

## 2015-11-17 MED ORDER — DIGOXIN 125 MCG PO TABS: 0.1250 mg | ORAL_TABLET | Freq: Every day | ORAL | Status: DC

## 2015-11-17 MED FILL — ?DIGITEK 125 MCG TABLET: 125 | 30 days supply | Qty: 30 | Fill #0

## 2015-11-17 NOTE — Telephone Encounter (Signed)
Call placed to the patient and confirmed his appointment at the [facility] tomorrow, 11/18/15 @ 1400.  He stated that he would be there and has transportation to the clinic.  He explained that he went to the ED 11/15/15 because his pulse was too high and his BP was too low. He said that he was instructed to decrease his lasix to 20mg  a day x 2 days and he has done that. Now he will resume his prior dose, 60 mg a day.  He noted that he is out of his digoxin and called the [facility] pharmacy for a refill. This CM spoke to [place] in the pharmacy who said that the refill would be ready today between 1430-1500.  This CM informed the patient of the time the digoxin would be ready and explained the use of the medications and  stressed the importance of picking it up today and he stated that he would.  Also informed him that this CM is waiting to speak to [facility] regarding a scale and possible BP cuff to use at home.  No other problems/ questions reported.

## 2015-11-17 NOTE — Telephone Encounter (Signed)
Call placed to Benjamin [name] # [phone], [facility] to inquire about the patient's eligibility for their home programs as he has the orange card and had applied for medicaid.  Voice mail message left requesting a call back to # [phone].

## 2015-11-18 ENCOUNTER — Telehealth: Payer: Self-pay

## 2015-11-18 ENCOUNTER — Encounter: Payer: Self-pay | Admitting: Family Medicine

## 2015-11-18 ENCOUNTER — Ambulatory Visit: Payer: Medicaid Other | Attending: Family Medicine | Admitting: Family Medicine

## 2015-11-18 VITALS — BP 118/88 | HR 95 | Temp 98.0°F | Resp 16 | Ht 67.0 in | Wt 226.0 lb

## 2015-11-18 DIAGNOSIS — Z7984 Long term (current) use of oral hypoglycemic drugs: Secondary | ICD-10-CM | POA: Insufficient documentation

## 2015-11-18 DIAGNOSIS — I2510 Atherosclerotic heart disease of native coronary artery without angina pectoris: Secondary | ICD-10-CM

## 2015-11-18 DIAGNOSIS — I4892 Unspecified atrial flutter: Secondary | ICD-10-CM

## 2015-11-18 DIAGNOSIS — Z7982 Long term (current) use of aspirin: Secondary | ICD-10-CM | POA: Insufficient documentation

## 2015-11-18 DIAGNOSIS — I10 Essential (primary) hypertension: Secondary | ICD-10-CM

## 2015-11-18 DIAGNOSIS — E119 Type 2 diabetes mellitus without complications: Secondary | ICD-10-CM | POA: Diagnosis present

## 2015-11-18 DIAGNOSIS — Z95810 Presence of automatic (implantable) cardiac defibrillator: Secondary | ICD-10-CM | POA: Diagnosis not present

## 2015-11-18 DIAGNOSIS — Z79899 Other long term (current) drug therapy: Secondary | ICD-10-CM | POA: Insufficient documentation

## 2015-11-18 DIAGNOSIS — R1030 Lower abdominal pain, unspecified: Secondary | ICD-10-CM

## 2015-11-18 DIAGNOSIS — I5022 Chronic systolic (congestive) heart failure: Secondary | ICD-10-CM | POA: Diagnosis not present

## 2015-11-18 DIAGNOSIS — Z794 Long term (current) use of insulin: Secondary | ICD-10-CM | POA: Diagnosis not present

## 2015-11-18 LAB — CUP PACEART INCLINIC DEVICE CHECK
Brady Statistic RV Percent Paced: 95 %
HIGH POWER IMPEDANCE MEASURED VALUE: 64.125
Implantable Lead Implant Date: 20161007
Implantable Lead Location: 753859
Implantable Lead Location: 753860
Implantable Lead Model: 7122
Lead Channel Impedance Value: 450 Ohm
Lead Channel Impedance Value: 525 Ohm
Lead Channel Impedance Value: 800 Ohm
Lead Channel Pacing Threshold Amplitude: 1 V
Lead Channel Pacing Threshold Amplitude: 1 V
Lead Channel Pacing Threshold Amplitude: 1.5 V
Lead Channel Pacing Threshold Amplitude: 1.5 V
Lead Channel Pacing Threshold Pulse Width: 0.5 ms
Lead Channel Pacing Threshold Pulse Width: 0.5 ms
Lead Channel Sensing Intrinsic Amplitude: 12 mV
Lead Channel Setting Pacing Amplitude: 2 V
Lead Channel Setting Pacing Pulse Width: 0.5 ms
Lead Channel Setting Pacing Pulse Width: 0.5 ms
MDC IDC LEAD IMPLANT DT: 20161007
MDC IDC LEAD IMPLANT DT: 20161007
MDC IDC LEAD LOCATION: 753858
MDC IDC MSMT BATTERY REMAINING LONGEVITY: 75.6
MDC IDC MSMT LEADCHNL LV PACING THRESHOLD PULSEWIDTH: 0.5 ms
MDC IDC MSMT LEADCHNL RA PACING THRESHOLD AMPLITUDE: 0.75 V
MDC IDC MSMT LEADCHNL RA PACING THRESHOLD AMPLITUDE: 0.75 V
MDC IDC MSMT LEADCHNL RA PACING THRESHOLD PULSEWIDTH: 0.5 ms
MDC IDC MSMT LEADCHNL RA SENSING INTR AMPL: 5 mV
MDC IDC MSMT LEADCHNL RV PACING THRESHOLD PULSEWIDTH: 0.5 ms
MDC IDC MSMT LEADCHNL RV PACING THRESHOLD PULSEWIDTH: 0.5 ms
MDC IDC PG SERIAL: 7294919
MDC IDC SESS DTM: 20170413163225
MDC IDC SET LEADCHNL LV PACING AMPLITUDE: 2.5 V
MDC IDC SET LEADCHNL RA PACING AMPLITUDE: 2 V
MDC IDC SET LEADCHNL RV SENSING SENSITIVITY: 0.5 mV
MDC IDC STAT BRADY RA PERCENT PACED: 12 %

## 2015-11-18 LAB — GLUCOSE, POCT (MANUAL RESULT ENTRY): POC Glucose: 133 mg/dl — AB (ref 70–99)

## 2015-11-18 MED ORDER — LISINOPRIL 2.5 MG PO TABS: 10.0000 mg | ORAL_TABLET | Freq: Every day | ORAL | Status: DC

## 2015-11-18 MED ORDER — TRAMADOL HCL 50 MG PO TABS: 50.0000 mg | ORAL_TABLET | Freq: Two times a day (BID) | ORAL | Status: DC | PRN

## 2015-11-18 MED FILL — traMADol HCL 50 MG TABS: 50 | 15 days supply | Qty: 30 | Fill #0

## 2015-11-18 MED FILL — LISINOPRIL 2.5 MG TABLET: 2.5 | 24 days supply | Qty: 90 | Fill #0

## 2015-11-18 NOTE — Progress Notes (Signed)
[name]  Date of Telephone Encounter:  Admit Date: 11/03/15 Discharge date: 11/09/15  PCP: Dr Doreene Burke    HPI: Benjamin [name] is a 55 y.o. male here today for a follow up visit at the [facility]. Medical History is significant for type 2 diabetes mellitus (A1c 7.3), hypertension, CHF (EF 20% status post AICD), atrial flutter three-vessel coronary artery disease recently hospitalized for NSTEMI. He underwent CHIP due to heavy coronary calcification, a successful rotational atherectomy and stenting of the mid LAD, angioplasty and stenting of his segmental distal RCA.  He was placed on Coumadin in addition to Plavix and aspirin for anticoagulation for atrial flutter due to high CHADS vasc score with plans to discontinue aspirin after 1 month and continue Coumadin and Plavix. He was also to continue amiodarone.  Since discharge he has been seen for follow-up visit by his cardiologist, ICD was interrogated on 11/13/15.  He has been monitoring his blood pressure at home and stopped taking his losartan due to the soft blood pressure and also decreased his twice-daily dosing of carvedilol 12.5 mg to once daily dosing. Complains of intermittent pain 4-5/10 in his bilateral femoral region at the site of cardiac cath which is worse at night and he is requesting an analgesic for. Patient has No headache, No chest pain, No abdominal pain - No Nausea, No new weakness tingling or numbness, No Cough - SOB.  Allergies  Allergen Reactions  . Heparin Other (See Comments)    Will not use pork-derived products due to religious beliefs   . Lovenox [Enoxaparin Sodium] Other (See Comments)    Will not use pork-derived products due to religious beliefs   . Pork-Derived Products Other (See Comments)    Does not eat pork or pork derived products d/t religious reasons   Past Medical History  Diagnosis Date  . Hypertension   . High cholesterol   . Cholelithiasis   . Chronic systolic  heart failure ([place])   . AICD (automatic cardioverter/defibrillator) present   . Kidney stone   . Pneumonia 2002  . Type II diabetes mellitus ([place])   . GERD (gastroesophageal reflux disease)   . Coronary artery disease     distal vessel disease   Current Outpatient Prescriptions on File Prior to Visit  Medication Sig Dispense Refill  . amiodarone (PACERONE) 200 MG tablet Take 2 tablets twice a day until 4/13, starting 4/14 take 1 tablet daily (Patient taking differently: Take 200 mg by mouth daily. ) 45 tablet 5  . aspirin EC 81 MG tablet Take 81 mg by mouth daily.    . carvedilol (COREG) 25 MG tablet Take 0.5 tablets (12.5 mg total) by mouth 2 (two) times daily with a meal. 30 tablet 11  . clopidogrel (PLAVIX) 75 MG tablet Take 1 tablet (75 mg total) by mouth daily. 90 tablet 3  . digoxin (LANOXIN) 0.125 MG tablet Take 1 tablet (0.125 mg total) by mouth daily. 30 tablet 2  . furosemide (LASIX) 20 MG tablet Take 3 tablets (60 mg total) by mouth daily. (Patient taking differently: Take 60 mg by mouth daily at 3 pm. ) 30 tablet 3  . insulin aspart (NOVOLOG FLEXPEN) 100 UNIT/ML FlexPen Inject 5-10 Units into the skin 2 (two) times daily as needed for high blood sugar (CBG >140). Dose is based on size of meal    . insulin glargine (LANTUS) 100 unit/mL SOPN Inject 22-23 Units into the skin at bedtime.    Marland Kitchen losartan (COZAAR) 25  MG tablet Take 1 tablet (25 mg total) by mouth 2 (two) times daily. 90 tablet 3  . metFORMIN (GLUCOPHAGE) 1000 MG tablet Take 1,000 mg by mouth daily with lunch.     . nitroGLYCERIN (NITROSTAT) 0.4 MG SL tablet Place 1 tablet (0.4 mg total) under the tongue every 5 (five) minutes x 3 doses as needed for chest pain. 25 tablet 3  . potassium chloride 20 MEQ TBCR Take 20 mEq by mouth daily. (Patient taking differently: Take 20 mEq by mouth every other day. ) 30 tablet 3  . pregabalin (LYRICA) 75 MG capsule Take 150 mg by mouth at bedtime.    . rosuvastatin (CRESTOR) 20 MG tablet  Take 1 tablet (20 mg total) by mouth daily. 90 tablet 3  . warfarin (COUMADIN) 2.5 MG tablet Take 1 tablet (2.5 mg total) by mouth daily. 30 tablet 5   No current facility-administered medications on file prior to visit.   Family History  Problem Relation Age of Onset  . Diabetes Mother   . Hypertension Mother   . Hyperlipidemia Mother   . Stroke Mother   . Diabetes Father   . Hypertension Father   . Stroke Father 33   Social History   Social History  . Marital Status: Married    Spouse Name: N/A  . Number of Children: N/A  . Years of Education: N/A   Occupational History  . Not on file.   Social History Main Topics  . Smoking status: Never Smoker   . Smokeless tobacco: Never Used  . Alcohol Use: No  . Drug Use: No  . Sexual Activity: No   Other Topics Concern  . Not on file   Social History Narrative    Review of Systems: Constitutional: Negative for fever, chills, diaphoresis, activity change, appetite change and fatigue. HENT: Negative for ear pain, nosebleeds, congestion, facial swelling, rhinorrhea, neck pain, neck stiffness and ear discharge.  Eyes: Negative for pain, discharge, redness, itching and visual disturbance. Respiratory: Negative for cough, choking, chest tightness, shortness of breath, wheezing and stridor.  Cardiovascular: Negative for chest pain, palpitations and leg swelling. Gastrointestinal: Negative for abdominal distention., bilateral femoral region pain Genitourinary: Negative for dysuria, urgency, frequency, hematuria, flank pain, decreased urine volume, difficulty urinating and dyspareunia.  Musculoskeletal: Negative for back pain, joint swelling, arthralgias and gait problem. Neurological: Negative for dizziness, tremors, seizures, syncope, facial asymmetry, speech difficulty, weakness, light-headedness, numbness and headaches.  Hematological: Negative for adenopathy. Does not bruise/bleed easily. Psychiatric/Behavioral: Negative for  hallucinations, behavioral problems, confusion, dysphoric mood, decreased concentration and agitation.    Objective: Filed Vitals:   11/18/15 1436  BP: 118/88  Pulse: 95  Temp: 98 F (36.7 C)  TempSrc: Oral  Resp: 16  Height: 5\' 7"  (1.702 m)  Weight: 226 lb (102.513 kg)  SpO2: 100%      Physical Exam: Constitutional: Patient appears well-developed and well-nourished. No distress. HENT: Normocephalic, atraumatic, External right and left ear normal. Oropharynx is clear and moist.  Eyes: Conjunctivae and EOM are normal. PERRLA, no scleral icterus. Neck: Normal ROM. Neck supple. No JVD. No tracheal deviation. No thyromegaly. CVS: RRR, S1/S2 +, no murmurs, no gallops, no carotid bruit.  Pulmonary: Effort and breath sounds normal, no stridor, rhonchi, wheezes, rales.  Abdominal: Soft. BS +,  no distension, bilateral femoral region mildly tender at sites of cardiac cath Musculoskeletal: Normal range of motion. No edema and no tenderness.  Lymphadenopathy: No lymphadenopathy noted, cervical, inguinal or axillary Neuro: Alert. Normal reflexes, muscle  tone coordination. No cranial nerve deficit. Skin: Skin is warm and dry. No rash noted. Not diaphoretic. No erythema. No pallor. Psychiatric: Normal mood and affect. Behavior, judgment, thought content normal.  Lab Results  Component Value Date   WBC 7.6 11/15/2015   HGB 14.1 11/15/2015   HCT 42.5 11/15/2015   MCV 76.7* 11/15/2015   PLT 168 11/15/2015   Lab Results  Component Value Date   CREATININE 1.49* 11/15/2015   BUN 22* 11/15/2015   NA 139 11/15/2015   K 4.4 11/15/2015   CL 103 11/15/2015   CO2 25 11/15/2015    Lab Results  Component Value Date   HGBA1C 7.3* 10/06/2015   Lipid Panel     Component Value Date/Time   CHOL 105 11/04/2015 0728   TRIG 97 11/04/2015 0728   HDL 29* 11/04/2015 0728   CHOLHDL 3.6 11/04/2015 0728   VLDL 19 11/04/2015 0728   LDLCALC 57 11/04/2015 0728       Assessment and plan:    1.  Type 2 diabetes mellitus without complication, with long-term current use of insulin (HCC) Controlled with A1c of 7.3  Continue medications  - Glucose (CBG) - Blood Glucose Monitoring Suppl (TRUE METRIX METER) w/Device KIT; Check blood sugar TID & QHS  Dispense: 1 kit; Refill: 0 - glucose blood (TRUE METRIX BLOOD GLUCOSE TEST) test strip; Use as instructed  Dispense: 100 each; Refill: 12 - TRUEPLUS LANCETS 28G MISC; 1 each by Does not apply route 3 (three) times daily before meals.  Dispense: 100 each; Refill: 12  2. Essential hypertension Blood Pressure is in the hypotensive range despite his not taking losartan and taking carvedilol 12.5 mg once daily I have substituted losartan with lisinopril and he will continue with carvedilol 12.5 mg once daily until his next visit; if blood pressure remains on the soft side at his next visit I will reduce his carvedilol to 6.25 twice daily. We will need to be mindful of subsequent tachycardia with reduction in carvedilol dose. Continue low-sodium, DASH diet  3. Chronic systolic congestive heart failure  EF of 20% No evidence of fluid overload Discussed fluid restriction of less than 2 L per day, daily weight Will check renal function as he recently had a bump in creatinine due to increase in dose of Lasix He is currently taking '40mg'$  of Lasix daily.   4. S/P ICD (internal cardiac defibrillator)  Status post ICD interrogation on 11/13/15 Keep appointment with cardiology and EP physician   5. Three-vessel Coronary artery disease involving native coronary artery of native heart without angina  Status post multivessel PCI  6. Atrial flutter Currently on amiodarone Will need baseline thyroid function study and PFT at next office visit Currently on anticoagulation with aspirin, Plavix and Coumadin; aspirin will be discontinued after one month and he will continue with Plavix and Coumadin as per cardiology. I have explained this to the patient as well  as the fact that he is at higher risk INR today is 3.0 INR is managed by the Coumadin clinic.  7. Groin pain Advised to apply ice at the site of cardiac cath. Tramadol as needed for pain.  Arnoldo Morale, [facility] [phone] 11/18/2015, 2:25 PM

## 2015-11-18 NOTE — Telephone Encounter (Signed)
Call received from Demetra Shiner, [facility] returning CM call. She stated that the patient would be eligible for their uninsured case management program providing he has his orange card; he would not be eligible for tele-monitoring at this time.  He would only be eligible for tele-monitoring if he has Medicaid - [place].  At this time, he is still waiting for a decision from DSS about his medicaid application.  Ms Veto Kemps then  transferred the call to Annice Pih [facility] Supervisor who noted that if the patient is approved for medicaid they can assist him in converting his medicaid to [place].

## 2015-11-18 NOTE — Patient Instructions (Signed)

## 2015-11-18 NOTE — Progress Notes (Signed)
Patient's here for f/up TCC Nstemi. 10 days out post surgery.  Patient requesting med refills.

## 2015-11-19 LAB — COMPLETE METABOLIC PANEL WITH GFR
ALBUMIN: 4.3 g/dL (ref 3.6–5.1)
ALT: 17 U/L (ref 9–46)
AST: 17 U/L (ref 10–35)
Alkaline Phosphatase: 53 U/L (ref 40–115)
BUN: 21 mg/dL (ref 7–25)
CALCIUM: 9 mg/dL (ref 8.6–10.3)
CO2: 23 mmol/L (ref 20–31)
CREATININE: 1.05 mg/dL (ref 0.70–1.33)
Chloride: 104 mmol/L (ref 98–110)
GFR, Est Non African American: 81 mL/min (ref >=60)
Glucose, Bld: 131 mg/dL — ABNORMAL HIGH (ref 65–99)
Potassium: 4.5 mmol/L (ref 3.5–5.3)
Sodium: 138 mmol/L (ref 135–146)
Total Bilirubin: 0.4 mg/dL (ref 0.2–1.2)
Total Protein: 7 g/dL (ref 6.1–8.1)

## 2015-11-20 ENCOUNTER — Telehealth: Payer: Self-pay

## 2015-11-20 NOTE — Telephone Encounter (Signed)
Scale received from Driscilla Moats, CHF Nurse Navigator. Call placed to patient to check status and to inform him that scale is available at clinic. Call placed to #[phone]; unable to reach patient. HIPPA compliant voicemail left requesting return call. In addition, call placed to patient's emergency contact [facility] Aud[facility]-spouse [phone]). Patient's spouse indicated patient was not currently home; HIPPA compliant message left with spouse requesting return call. Awaiting return call from patient.

## 2015-11-20 NOTE — Telephone Encounter (Signed)
This Case Manager received return call from patient. Patient informed that there is a scale available for him to pick-up at clinic. Patient appreciative of information and indicated he planned to pick up scale on 11/21/15. Inquired if patient picked up Lisinopril as Dr. Venetia Night substituted losartan with Lisinopril on 11/18/15. Patient indicated he picked up medication on 11/19/15. Reminded patient to continue eating a low sodium diet and to begin doing daily weights and keeping weight log once he receives scale. Patient verbalized understanding. No additional needs/concerns identified.

## 2015-11-21 ENCOUNTER — Ambulatory Visit (INDEPENDENT_AMBULATORY_CARE_PROVIDER_SITE_OTHER): Payer: No Typology Code available for payment source | Admitting: Surgery

## 2015-11-21 DIAGNOSIS — I4892 Unspecified atrial flutter: Secondary | ICD-10-CM

## 2015-11-21 LAB — POCT INR: INR: 2.9

## 2015-11-24 ENCOUNTER — Telehealth: Payer: Self-pay

## 2015-11-24 NOTE — Telephone Encounter (Signed)
Call placed to the patient to inquire if he has been checking his BP and weights. He picked up the scale and BP monitor at [facility] on Friday, 11/21/15. He said that he has been checking his weight daily and today it was 222 lbs and noted that his weight has been ' going down."  Reminded him to keep a log of his daily weights and BP results. He said that his BP has been running 100/69-70 and his pulse is 73-75. He said that he is not sure if the BP monitor is correct. Instructed him to bring the monitor to his next appointment at [facility] and the results can be compared to the BP machine in the clinic and he stated that he would do that. He did not report any questions/ concerns and did not report any signs of bleeding.  He noted that he has his INR checked every Friday.  He also stated that he is still waiting for a decision about his disability and he confirmed that the disability paperwork has all been received.  An appointment was scheduled in the [facility] for 12/02/15 @ 0930.  He was very appreciative of the call and the appointment.

## 2015-11-28 ENCOUNTER — Ambulatory Visit (INDEPENDENT_AMBULATORY_CARE_PROVIDER_SITE_OTHER): Payer: Medicaid Other | Admitting: *Deleted

## 2015-11-28 DIAGNOSIS — I4892 Unspecified atrial flutter: Secondary | ICD-10-CM | POA: Diagnosis not present

## 2015-11-28 LAB — POCT INR: INR: 2.2

## 2015-12-01 ENCOUNTER — Telehealth: Payer: Self-pay

## 2015-12-01 NOTE — Telephone Encounter (Signed)
Call placed to the patient and confirmed his appointment for tomorrow , 12/02/15 @ 0930 at the [facility] with Dr Venetia Night. He stated that he has transportation to the clinic. He stated that the new scale that he was given is not working and he has been using his old scale and he will bring the new scale to the clinic tomorrow. He noted that his weight is holding " steady" at 225 pounds.  He also said that his BP was low when he was at the coumadin clinic last week. He noted that today his BP was 107/80. He denied any dizziness and said that he is concerned that his BP will get too low. Explained that the doctor will review all of his medications tomorrow. Also instructed him to bring his BP machine with him to his appointment tomorrow to compare the results of his BP with his home machine and the office BP machine and he stated that he would.  He said that his INR was 2.2 and he denied any signs of bleeding.  No other problems/questions reported.

## 2015-12-02 ENCOUNTER — Encounter: Payer: Self-pay | Admitting: Family Medicine

## 2015-12-02 ENCOUNTER — Encounter: Payer: Self-pay | Admitting: Internal Medicine

## 2015-12-02 ENCOUNTER — Ambulatory Visit: Payer: Medicaid Other | Attending: Family Medicine | Admitting: Family Medicine

## 2015-12-02 VITALS — BP 110/85 | HR 94 | Temp 98.3°F | Resp 16 | Ht 67.0 in | Wt 229.2 lb

## 2015-12-02 DIAGNOSIS — Z7982 Long term (current) use of aspirin: Secondary | ICD-10-CM | POA: Insufficient documentation

## 2015-12-02 DIAGNOSIS — Z794 Long term (current) use of insulin: Secondary | ICD-10-CM | POA: Diagnosis not present

## 2015-12-02 DIAGNOSIS — I4892 Unspecified atrial flutter: Secondary | ICD-10-CM | POA: Insufficient documentation

## 2015-12-02 DIAGNOSIS — Z9889 Other specified postprocedural states: Secondary | ICD-10-CM | POA: Insufficient documentation

## 2015-12-02 DIAGNOSIS — I2510 Atherosclerotic heart disease of native coronary artery without angina pectoris: Secondary | ICD-10-CM | POA: Diagnosis not present

## 2015-12-02 DIAGNOSIS — I5022 Chronic systolic (congestive) heart failure: Secondary | ICD-10-CM | POA: Diagnosis not present

## 2015-12-02 DIAGNOSIS — E119 Type 2 diabetes mellitus without complications: Secondary | ICD-10-CM | POA: Diagnosis present

## 2015-12-02 DIAGNOSIS — Z9049 Acquired absence of other specified parts of digestive tract: Secondary | ICD-10-CM | POA: Diagnosis not present

## 2015-12-02 DIAGNOSIS — Z95810 Presence of automatic (implantable) cardiac defibrillator: Secondary | ICD-10-CM | POA: Diagnosis not present

## 2015-12-02 DIAGNOSIS — I10 Essential (primary) hypertension: Secondary | ICD-10-CM | POA: Insufficient documentation

## 2015-12-02 DIAGNOSIS — Z79899 Other long term (current) drug therapy: Secondary | ICD-10-CM | POA: Diagnosis not present

## 2015-12-02 DIAGNOSIS — Z7984 Long term (current) use of oral hypoglycemic drugs: Secondary | ICD-10-CM | POA: Insufficient documentation

## 2015-12-02 LAB — GLUCOSE, POCT (MANUAL RESULT ENTRY): POC GLUCOSE: 80 mg/dL (ref 70–99)

## 2015-12-02 MED ORDER — CARVEDILOL 6.25 MG PO TABS: 6.2500 mg | ORAL_TABLET | Freq: Two times a day (BID) | ORAL | Status: DC

## 2015-12-02 MED ORDER — LISINOPRIL 5 MG PO TABS: 5.0000 mg | ORAL_TABLET | Freq: Every day | ORAL | Status: DC

## 2015-12-02 MED ORDER — FUROSEMIDE 20 MG PO TABS: 40.0000 mg | ORAL_TABLET | Freq: Every day | ORAL | Status: DC

## 2015-12-02 MED FILL — FUROSEMIDE 20 MG TABLET: 20 | 15 days supply | Qty: 30 | Fill #0

## 2015-12-02 NOTE — Progress Notes (Signed)
[place]  Date of Telephone Encounter:  Admit Date: 11/03/15 Discharge date: 11/09/15  PCP: Dr Doreene Burke   Subjective:    Patient ID: Benjamin [name], male    DOB: 1961/05/11, 55 y.o.   MRN: [id number]  HPI He is a 55 year old male here today for a follow up visit at the [facility] care clinic. Medical History is significant for type 2 diabetes mellitus (A1c 7.3), hypertension, CHF (EF 20% status post AICD which was interrogated on 11/13/15), atrial flutter three-vessel coronary artery disease recently hospitalized for NSTEMI.  He underwent CHIP due to heavy coronary calcification, a successful rotational atherectomy and stenting of the mid LAD, angioplasty and stenting of his segmental distal RCA.  He is on Coumadin in addition to Plavix and aspirin for anticoagulation for atrial flutter due to high CHADS vasc score with plans to discontinue aspirin after 1 month and continue Coumadin and Plavix.   Interval history: At his last office visit losartan was discontinued and substituted with the lisinopril and he had been taking this for 5 mg of Coreg once a day due to low blood pressures. He informs me that at home he sometimes has values of 90/68 and at other times his blood pressure cuff shows an error reading. Blood pressure taken with the dynamap measured 141/90 but when I repeated it manually after the patient had rested for a repeat it was 110/85 and blood pressure with the patient on blood pressure cuff read 108/85.  He occasionally has dizziness and is concerned his blood pressure is too low. Past Medical History  Diagnosis Date  . Hypertension   . High cholesterol   . Cholelithiasis   . Chronic systolic heart failure ([place])   . AICD (automatic cardioverter/defibrillator) present   . Kidney stone   . Pneumonia 2002  . Type II diabetes mellitus ([place])   . GERD (gastroesophageal reflux disease)   . Coronary artery disease     distal vessel disease    Past Surgical History   Procedure Laterality Date  . Colonoscopy N/A 07/12/2013    Procedure: COLONOSCOPY;  Surgeon: Lear Ng, MD;  Location: [facility];  Service: Endoscopy;  Laterality: N/A;  . Ep implantable device N/A 05/09/2015    Procedure: BiV ICD Insertion CRT-D;  Surgeon: Evans Lance, MD;  Location: [place] [facility];  Service: Cardiovascular;  Laterality: N/A;  . Cholecystectomy  10/09/2015    Procedure: LAPAROSCOPIC CHOLECYSTECTOMY;  Surgeon: Fanny Skates, MD;  Location: [place];  Service: General;;  . Cardiac catheterization N/A 10/07/2015    Procedure: Left Heart Cath and Coronary Angiography;  Surgeon: Belva Crome, MD;  Location: [place] [facility];  Service: Cardiovascular;  Laterality: N/A;  . Cardiac catheterization N/A 11/05/2015    Procedure: Coronary Stent Intervention Rotablater;  Surgeon: Belva Crome, MD;  Location: [place] [facility];  Service: Cardiovascular;  Laterality: N/A;    Social History   Social History  . Marital Status: Married    Spouse Name: N/A  . Number of Children: N/A  . Years of Education: N/A   Occupational History  . Not on file.   Social History Main Topics  . Smoking status: Never Smoker   . Smokeless tobacco: Never Used  . Alcohol Use: No  . Drug Use: No  . Sexual Activity: No   Other Topics Concern  . Not on file   Social History Narrative    Allergies  Allergen Reactions  . Heparin Other (See  Comments)    Will not use pork-derived products due to religious beliefs   . Lovenox [Enoxaparin Sodium] Other (See Comments)    Will not use pork-derived products due to religious beliefs   . Pork-Derived Products Other (See Comments)    Does not eat pork or pork derived products d/t religious reasons    Current Outpatient Prescriptions on File Prior to Visit  Medication Sig Dispense Refill  . amiodarone (PACERONE) 200 MG tablet Take 2 tablets twice a day until 4/13, starting 4/14 take 1 tablet daily (Patient taking differently: Take 200  mg by mouth daily. ) 45 tablet 5  . aspirin EC 81 MG tablet Take 81 mg by mouth daily.    . clopidogrel (PLAVIX) 75 MG tablet Take 1 tablet (75 mg total) by mouth daily. 90 tablet 3  . digoxin (LANOXIN) 0.125 MG tablet Take 1 tablet (0.125 mg total) by mouth daily. 30 tablet 2  . insulin aspart (NOVOLOG FLEXPEN) 100 UNIT/ML FlexPen Inject 5-10 Units into the skin 2 (two) times daily as needed for high blood sugar (CBG >140). Dose is based on size of meal    . insulin glargine (LANTUS) 100 unit/mL SOPN Inject 22-23 Units into the skin at bedtime.    . metFORMIN (GLUCOPHAGE) 1000 MG tablet Take 1,000 mg by mouth daily with lunch.     . potassium chloride 20 MEQ TBCR Take 20 mEq by mouth daily. (Patient taking differently: Take 20 mEq by mouth every other day. ) 30 tablet 3  . pregabalin (LYRICA) 75 MG capsule Take 150 mg by mouth at bedtime.    . rosuvastatin (CRESTOR) 20 MG tablet Take 1 tablet (20 mg total) by mouth daily. 90 tablet 3  . traMADol (ULTRAM) 50 MG tablet Take 1 tablet (50 mg total) by mouth every 12 (twelve) hours as needed. 30 tablet 0  . warfarin (COUMADIN) 2.5 MG tablet Take 1 tablet (2.5 mg total) by mouth daily. 30 tablet 5  . nitroGLYCERIN (NITROSTAT) 0.4 MG SL tablet Place 1 tablet (0.4 mg total) under the tongue every 5 (five) minutes x 3 doses as needed for chest pain. (Patient not taking: Reported on 12/02/2015) 25 tablet 3   No current facility-administered medications on file prior to visit.      Review of Systems Constitutional: Negative for fever, chills, diaphoresis, activity change, appetite change and fatigue. HENT: Negative for ear pain, nosebleeds, congestion, facial swelling, rhinorrhea, neck pain, neck stiffness and ear discharge.  Eyes: Negative for pain, discharge, redness, itching and visual disturbance. Respiratory: Negative for cough, choking, chest tightness, shortness of breath, wheezing and stridor.  Cardiovascular: Negative for chest pain,  palpitations and leg swelling. Gastrointestinal: Negative for abdominal distention Genitourinary: Negative for dysuria, urgency, frequency, hematuria, flank pain, decreased urine volume, difficulty urinating and dyspareunia.  Musculoskeletal: Negative for back pain, joint swelling, arthralgias and gait problem. Neurological: Negative for dizziness, tremors, seizures, syncope, facial asymmetry, speech difficulty, weakness, light-headedness, numbness and headaches.  Hematological: Negative for adenopathy. Does not bruise/bleed easily. Psychiatric/Behavioral: Negative for hallucinations, behavioral problems, confusion, dysphoric mood, decreased concentration and agitation.       Objective: Filed Vitals:   12/02/15 0936 12/02/15 1022  BP: 141/90 110/85  Pulse: 94   Temp: 98.3 F (36.8 C)   TempSrc: Oral   Resp: 16   Height: '5\' 7"'$  (1.702 m)   Weight: 229 lb 3.2 oz (103.964 kg)   SpO2: 97%       Physical Exam  Constitutional: Patient appears well-developed  and well-nourished. No distress. HENT: Normocephalic, atraumatic, External right and left ear normal. Oropharynx is clear and moist.  Eyes: Conjunctivae and EOM are normal. PERRLA, no scleral icterus. Neck: Normal ROM. Neck supple. No JVD. No tracheal deviation. No thyromegaly. CVS: RRR, S1/S2 +, no murmurs, no gallops, no carotid bruit.  Pulmonary: Effort and breath sounds normal, no stridor, rhonchi, wheezes, rales.  Abdominal: Soft. BS +,  no distension, Musculoskeletal: Normal range of motion. No edema and no tenderness.  Lymphadenopathy: No lymphadenopathy noted, cervical, inguinal or axillary Neuro: Alert. Normal reflexes, muscle tone coordination. No cranial nerve deficit. Skin: Skin is warm and dry. No rash noted. Not diaphoretic. No erythema. No pallor. Psychiatric: Normal mood and affect. Behavior, judgment, thought content normal.      Assessment & Plan:  1. Type 2 diabetes mellitus without complication, with  long-term current use of insulin (HCC) Controlled with A1c of 7.3  Continue medications  Diabetic HCM at next visit - Glucose (CBG) - Blood Glucose Monitoring Suppl (TRUE METRIX METER) w/Device KIT; Check blood sugar TID & QHS  Dispense: 1 kit; Refill: 0 - glucose blood (TRUE METRIX BLOOD GLUCOSE TEST) test strip; Use as instructed  Dispense: 100 each; Refill: 12 - TRUEPLUS LANCETS 28G MISC; 1 each by Does not apply route 3 (three) times daily before meals.  Dispense: 100 each; Refill: 12  2. Essential hypertension Blood Pressure is in the hypotensive range manually Reduce carvedilol to 6.25 mg twice daily. Continue with lisinopril 5 mg daily. Advised to continue keeping a blood pressure log and notify the clinic if parameters fall outside of <90/60 and >140/90. Discussed symptoms of hypotension  Continue low-sodium, DASH diet  3. Chronic systolic congestive heart failure  EF of 20% No evidence of fluid overload Discussed fluid restriction of less than 2 L per day, daily weight Renal function is back to baseline He is currently taking '40mg'$  of Lasix daily.   4. S/P ICD (internal cardiac defibrillator)  Status post ICD interrogation on 11/13/15 Keep appointment with cardiology and EP physician   5. Three-vessel Coronary artery disease involving native coronary artery of native heart without angina  Status post multivessel PCI  6. Atrial flutter Currently on amiodarone TFTs wnl, Baseline PFT ordered Currently on anticoagulation with aspirin, Plavix and Coumadin; aspirin will be discontinued after one month (which happens to be on 12/09/15 )and he will continue with Plavix and Coumadin as per cardiology. INR is managed by the Coumadin clinic; last INR was 2.2

## 2015-12-02 NOTE — Progress Notes (Signed)
Patient's here for f/up DM, and CHF.  Patient has changed his dosage for BP meds to tab daily.

## 2015-12-05 ENCOUNTER — Telehealth: Payer: Self-pay

## 2015-12-05 ENCOUNTER — Ambulatory Visit (INDEPENDENT_AMBULATORY_CARE_PROVIDER_SITE_OTHER): Payer: Medicaid Other | Admitting: Pharmacist

## 2015-12-05 DIAGNOSIS — I4892 Unspecified atrial flutter: Secondary | ICD-10-CM

## 2015-12-05 LAB — POCT INR: INR: 2.4

## 2015-12-05 NOTE — Telephone Encounter (Signed)
Patient brought scale to his appointment on 12/02/15 and indicated the scale was no longer working.  This Case Manager placed call to patient to inform him that scale fixed and ready for pick-up. Patient indicated he was in the area because of a [facility] appointment and would pick up scale today. Inquired about patient's status. Patient indicated he was doing well and had no questions/concerns. Next appointment scheduled for 12/16/15 at 1430 with Dr. Venetia Night.

## 2015-12-10 MED FILL — ?DIGITEK 125 MCG TABLET: 125 | 30 days supply | Qty: 30 | Fill #1

## 2015-12-10 MED FILL — CLOPIDOGREL 75 MG TABLET: 75 | 30 days supply | Qty: 30 | Fill #1

## 2015-12-12 ENCOUNTER — Ambulatory Visit (HOSPITAL_COMMUNITY)
Admission: RE | Admit: 2015-12-12 | Discharge: 2015-12-12 | Disposition: A | Discharge status: Home / Self Care | Payer: Medicaid Other | Source: Ambulatory Visit | Attending: Family Medicine | Admitting: Family Medicine

## 2015-12-12 DIAGNOSIS — I4892 Unspecified atrial flutter: Secondary | ICD-10-CM | POA: Diagnosis present

## 2015-12-12 DIAGNOSIS — Z79899 Other long term (current) drug therapy: Secondary | ICD-10-CM | POA: Diagnosis not present

## 2015-12-12 LAB — PULMONARY FUNCTION TEST
DL/VA % PRED: 104 %
DL/VA: 4.64 ml/min/mmHg/L
DLCO UNC % PRED: 72 %
DLCO unc: 20.65 ml/min/mmHg
FEF 25-75 POST: 2.32 L/s
FEF 25-75 Pre: 1.44 L/sec
FEF2575-%CHANGE-POST: 61 %
FEF2575-%PRED-PRE: 47 %
FEF2575-%Pred-Post: 77 %
FEV1-%Change-Post: 16 %
FEV1-%Pred-Post: 71 %
FEV1-%Pred-Pre: 61 %
FEV1-PRE: 2.11 L
FEV1-Post: 2.46 L
FEV1FVC-%CHANGE-POST: 12 %
FEV1FVC-%PRED-PRE: 89 %
FEV6-%Change-Post: 5 %
FEV6-%Pred-Post: 74 %
FEV6-%Pred-Pre: 70 %
FEV6-Post: 3.18 L
FEV6-Pre: 3 L
FEV6FVC-%Change-Post: 1 %
FEV6FVC-%PRED-POST: 104 %
FEV6FVC-%Pred-Pre: 102 %
FVC-%Change-Post: 3 %
FVC-%PRED-PRE: 68 %
FVC-%Pred-Post: 71 %
FVC-POST: 3.18 L
FVC-PRE: 3.06 L
PRE FEV1/FVC RATIO: 69 %
Post FEV1/FVC ratio: 77 %
Post FEV6/FVC ratio: 100 %
Pre FEV6/FVC Ratio: 98 %
RV % pred: 77 %
RV: 1.52 L
TLC % PRED: 72 %
TLC: 4.61 L

## 2015-12-12 MED ORDER — ALBUTEROL SULFATE (2.5 MG/3ML) 0.083% IN NEBU
2.5000 mg | INHALATION_SOLUTION | Freq: Once | RESPIRATORY_TRACT | Status: AC
  Administered 2015-12-12: 2.5 mg via RESPIRATORY_TRACT

## 2015-12-15 ENCOUNTER — Telehealth: Payer: Self-pay

## 2015-12-15 NOTE — Telephone Encounter (Signed)
Call received from the patient and he confirmed that he will be at his appointment at the [facility] tomorrow, 12/16/15 @ 1430 and that he has transportation. He then noted that his scale is not working. Instructed him to bring the scale with him to the clinic tomorrow and he said that he would. He noted that he has a scale at home but he does not think that it is as accurate as it should be.  No other problems/ questions reported.

## 2015-12-15 NOTE — Telephone Encounter (Signed)
Attempted to contact the patient to check on his status and to remind him of his appointment at the [facility] at [facility] tomorrow, 12/16/15 @ 1430. Call placed to # [phone] (M) and a HIPAA compliant voicemail message was left requesting a call back to # [phone] or [phone].

## 2015-12-16 ENCOUNTER — Encounter: Payer: Self-pay | Admitting: Family Medicine

## 2015-12-16 ENCOUNTER — Ambulatory Visit: Payer: Medicaid Other | Attending: Family Medicine | Admitting: Family Medicine

## 2015-12-16 ENCOUNTER — Telehealth: Payer: Self-pay

## 2015-12-16 VITALS — BP 125/76 | HR 78 | Temp 98.3°F | Resp 16 | Ht 67.0 in | Wt 222.0 lb

## 2015-12-16 DIAGNOSIS — Z9889 Other specified postprocedural states: Secondary | ICD-10-CM | POA: Insufficient documentation

## 2015-12-16 DIAGNOSIS — Z87442 Personal history of urinary calculi: Secondary | ICD-10-CM | POA: Diagnosis not present

## 2015-12-16 DIAGNOSIS — Z9049 Acquired absence of other specified parts of digestive tract: Secondary | ICD-10-CM | POA: Insufficient documentation

## 2015-12-16 DIAGNOSIS — Z23 Encounter for immunization: Secondary | ICD-10-CM | POA: Diagnosis not present

## 2015-12-16 DIAGNOSIS — Z794 Long term (current) use of insulin: Secondary | ICD-10-CM | POA: Diagnosis not present

## 2015-12-16 DIAGNOSIS — Z79899 Other long term (current) drug therapy: Secondary | ICD-10-CM | POA: Insufficient documentation

## 2015-12-16 DIAGNOSIS — Z95810 Presence of automatic (implantable) cardiac defibrillator: Secondary | ICD-10-CM | POA: Diagnosis not present

## 2015-12-16 DIAGNOSIS — E1165 Type 2 diabetes mellitus with hyperglycemia: Secondary | ICD-10-CM

## 2015-12-16 DIAGNOSIS — Z7902 Long term (current) use of antithrombotics/antiplatelets: Secondary | ICD-10-CM | POA: Diagnosis not present

## 2015-12-16 DIAGNOSIS — Z91018 Allergy to other foods: Secondary | ICD-10-CM | POA: Diagnosis not present

## 2015-12-16 DIAGNOSIS — Z7982 Long term (current) use of aspirin: Secondary | ICD-10-CM | POA: Insufficient documentation

## 2015-12-16 DIAGNOSIS — I4892 Unspecified atrial flutter: Secondary | ICD-10-CM | POA: Diagnosis not present

## 2015-12-16 DIAGNOSIS — Z888 Allergy status to other drugs, medicaments and biological substances status: Secondary | ICD-10-CM | POA: Insufficient documentation

## 2015-12-16 DIAGNOSIS — E669 Obesity, unspecified: Secondary | ICD-10-CM | POA: Diagnosis not present

## 2015-12-16 DIAGNOSIS — E119 Type 2 diabetes mellitus without complications: Secondary | ICD-10-CM | POA: Diagnosis not present

## 2015-12-16 DIAGNOSIS — K8020 Calculus of gallbladder without cholecystitis without obstruction: Secondary | ICD-10-CM | POA: Insufficient documentation

## 2015-12-16 DIAGNOSIS — I5022 Chronic systolic (congestive) heart failure: Secondary | ICD-10-CM | POA: Diagnosis not present

## 2015-12-16 DIAGNOSIS — I2510 Atherosclerotic heart disease of native coronary artery without angina pectoris: Secondary | ICD-10-CM | POA: Insufficient documentation

## 2015-12-16 DIAGNOSIS — K219 Gastro-esophageal reflux disease without esophagitis: Secondary | ICD-10-CM | POA: Insufficient documentation

## 2015-12-16 DIAGNOSIS — E7800 Pure hypercholesterolemia, unspecified: Secondary | ICD-10-CM | POA: Insufficient documentation

## 2015-12-16 DIAGNOSIS — Z7901 Long term (current) use of anticoagulants: Secondary | ICD-10-CM | POA: Diagnosis not present

## 2015-12-16 DIAGNOSIS — I110 Hypertensive heart disease with heart failure: Secondary | ICD-10-CM | POA: Diagnosis present

## 2015-12-16 DIAGNOSIS — I10 Essential (primary) hypertension: Secondary | ICD-10-CM

## 2015-12-16 DIAGNOSIS — Z79891 Long term (current) use of opiate analgesic: Secondary | ICD-10-CM | POA: Insufficient documentation

## 2015-12-16 DIAGNOSIS — IMO0002 Reserved for concepts with insufficient information to code with codable children: Secondary | ICD-10-CM

## 2015-12-16 LAB — GLUCOSE, POCT (MANUAL RESULT ENTRY): POC Glucose: 106 mg/dl — AB (ref 70–99)

## 2015-12-16 NOTE — Progress Notes (Signed)
Subjective:    Patient ID: Benjamin [name], male    DOB: 11-11-1960, 55 y.o.   MRN: [id number]  HPI He is a 55 year old male here today for a follow up visit of his blood pressure which has remained in the hypotensive range leading to reduction in his dose of carvedilol. He was prescribed 6.25 mg twice daily at his last visit but states he takes one third of the 25 mg tablets which he has at home twice daily and takes his lisinopril in the middle of the day due to low blood pressures in the mornings.   Medical History is significant for type 2 diabetes mellitus (A1c 7.3), hypertension, CHF (EF 20% status post AICD which was interrogated on 11/13/15), atrial flutter three-vessel coronary artery disease recently hospitalized for NSTEMI s/p CHIP due to heavy coronary calcification, a successful rotational atherectomy and stenting of the mid LAD, angioplasty and stenting of his segmental distal RCA. He was previously on Coumadin, Plavix and aspirin but aspirin was discontinued last week and he remains on Plavix and Coumadin.  Reviewed baseline PFT results with the patient; it reveals a combination of moderate obstructive airway disease and mild restrictive airway disease. He denies shortness of breath, wheezing or cough and has no chest pains. He has no complaints today.  Past Medical History  Diagnosis Date  . Hypertension   . High cholesterol   . Cholelithiasis   . Chronic systolic heart failure (HCC)   . AICD (automatic cardioverter/defibrillator) present   . Kidney stone   . Pneumonia 2002  . Type II diabetes mellitus (HCC)   . GERD (gastroesophageal reflux disease)   . Coronary artery disease     distal vessel disease    Past Surgical History  Procedure Laterality Date  . Colonoscopy N/A 07/12/2013    Procedure: COLONOSCOPY;  Surgeon: Shirley Friar, MD;  Location: [facility];  Service: Endoscopy;  Laterality: N/A;  . Ep implantable device N/A 05/09/2015    Procedure: BiV ICD  Insertion CRT-D;  Surgeon: Marinus Maw, MD;  Location: [facility] INVASIVE CV LAB;  Service: Cardiovascular;  Laterality: N/A;  . Cholecystectomy  10/09/2015    Procedure: LAPAROSCOPIC CHOLECYSTECTOMY;  Surgeon: Claud Kelp, MD;  Location: [facility] OR;  Service: General;;  . Cardiac catheterization N/A 10/07/2015    Procedure: Left Heart Cath and Coronary Angiography;  Surgeon: Lyn Records, MD;  Location: [facility] INVASIVE CV LAB;  Service: Cardiovascular;  Laterality: N/A;  . Cardiac catheterization N/A 11/05/2015    Procedure: Coronary Stent Intervention Rotablater;  Surgeon: Lyn Records, MD;  Location: [facility] INVASIVE CV LAB;  Service: Cardiovascular;  Laterality: N/A;    Social History   Social History  . Marital Status: Married    Spouse Name: N/A  . Number of Children: N/A  . Years of Education: N/A   Occupational History  . Not on file.   Social History Main Topics  . Smoking status: Never Smoker   . Smokeless tobacco: Never Used  . Alcohol Use: No  . Drug Use: No  . Sexual Activity: No   Other Topics Concern  . Not on file   Social History Narrative    Allergies  Allergen Reactions  . Heparin Other (See Comments)    Will not use pork-derived products due to religious beliefs   . Lovenox [Enoxaparin Sodium] Other (See Comments)    Will not use pork-derived products due to religious beliefs   . Pork-Derived Products Other (See Comments)  Does not eat pork or pork derived products d/t religious reasons    Current Outpatient Prescriptions on File Prior to Visit  Medication Sig Dispense Refill  . amiodarone (PACERONE) 200 MG tablet Take 2 tablets twice a day until 4/13, starting 4/14 take 1 tablet daily (Patient taking differently: Take 200 mg by mouth daily. ) 45 tablet 5  . aspirin EC 81 MG tablet Take 81 mg by mouth daily.    . carvedilol (COREG) 6.25 MG tablet Take 1 tablet (6.25 mg total) by mouth 2 (two) times daily with a meal. 60 tablet 3  . clopidogrel (PLAVIX) 75 MG tablet  Take 1 tablet (75 mg total) by mouth daily. 90 tablet 3  . digoxin (LANOXIN) 0.125 MG tablet Take 1 tablet (0.125 mg total) by mouth daily. 30 tablet 2  . furosemide (LASIX) 20 MG tablet Take 2 tablets (40 mg total) by mouth daily. 30 tablet 3  . insulin aspart (NOVOLOG FLEXPEN) 100 UNIT/ML FlexPen Inject 5-10 Units into the skin 2 (two) times daily as needed for high blood sugar (CBG >140). Dose is based on size of meal    . insulin glargine (LANTUS) 100 unit/mL SOPN Inject 22-23 Units into the skin at bedtime.    Marland Kitchen lisinopril (PRINIVIL,ZESTRIL) 5 MG tablet Take 1 tablet (5 mg total) by mouth daily. 30 tablet 2  . metFORMIN (GLUCOPHAGE) 1000 MG tablet Take 1,000 mg by mouth daily with lunch.     . nitroGLYCERIN (NITROSTAT) 0.4 MG SL tablet Place 1 tablet (0.4 mg total) under the tongue every 5 (five) minutes x 3 doses as needed for chest pain. 25 tablet 3  . potassium chloride 20 MEQ TBCR Take 20 mEq by mouth daily. (Patient taking differently: Take 20 mEq by mouth every other day. ) 30 tablet 3  . pregabalin (LYRICA) 75 MG capsule Take 150 mg by mouth at bedtime.    . rosuvastatin (CRESTOR) 20 MG tablet Take 1 tablet (20 mg total) by mouth daily. 90 tablet 3  . warfarin (COUMADIN) 2.5 MG tablet Take 1 tablet (2.5 mg total) by mouth daily. 30 tablet 5  . traMADol (ULTRAM) 50 MG tablet Take 1 tablet (50 mg total) by mouth every 12 (twelve) hours as needed. (Patient not taking: Reported on 12/16/2015) 30 tablet 0   No current facility-administered medications on file prior to visit.      Review of Systems  Constitutional: Negative for activity change and appetite change.  HENT: Negative for sinus pressure and sore throat.   Eyes: Negative for visual disturbance.  Respiratory: Negative for cough, chest tightness and shortness of breath.   Cardiovascular: Negative for chest pain and leg swelling.  Gastrointestinal: Negative for abdominal pain, diarrhea, constipation and abdominal distention.    Endocrine: Negative.   Genitourinary: Negative for dysuria.  Musculoskeletal: Negative for myalgias and joint swelling.  Skin: Negative for rash.  Allergic/Immunologic: Negative.   Neurological: Negative for weakness, light-headedness and numbness.  Psychiatric/Behavioral: Negative for suicidal ideas and dysphoric mood.       Objective: Filed Vitals:   12/16/15 1426  BP: 125/76  Pulse: 78  Temp: 98.3 F (36.8 C)  TempSrc: Oral  Resp: 16  Height:  (1.702 m)  Weight: 222 lb (100.699 kg)  SpO2: 98%      Physical Exam  Constitutional: He is oriented to person, place, and time. He appears well-developed and well-nourished.  Cardiovascular: Normal rate, normal heart sounds and intact distal pulses.  An irregular rhythm present.  No murmur heard. Pulmonary/Chest: Effort normal and breath sounds normal. He has no wheezes. He has no rales. He exhibits no tenderness.  Abdominal: Soft. Bowel sounds are normal. He exhibits no distension and no mass. There is no tenderness.  Musculoskeletal: Normal range of motion.  Neurological: He is alert and oriented to person, place, and time.          Assessment & Plan:  1. Type 2 diabetes mellitus without complication, with long-term current use of insulin (HCC) Controlled with A1c of 7.3  Continue medications  Foot exam, Pneumovax today; advised to schedule annual eye exam with optometry or ophthalmologist and I have discussed the available community resources with him.  2. Essential hypertension Blood Pressure is controlled  Advised to continue keeping a blood pressure log and notify the clinic if parameters fall outside of <90/60 and >140/90. Discussed symptoms of hypotension  Continue low-sodium, DASH diet  3. Chronic systolic congestive heart failure  EF of 20% No evidence of fluid overload Discussed fluid restriction of less than 2 L per day, daily weight Renal function is back to baseline He is currently taking 40mg  of  Lasix daily.   4. S/P ICD (internal cardiac defibrillator)  Status post ICD interrogation on 11/13/15 Keep appointment with cardiology and EP physician   5. Three-vessel Coronary artery disease involving native coronary artery of native heart without angina  Status post multivessel PCI  6. Atrial flutter Currently on amiodarone TFTs wnl, Baseline PFT- reveals moderate obstructive airway disease and mild restrictive disease Currently on anticoagulation with Plavix and Coumadin INR is managed by the Coumadin clinic; last INR was 2.4  7. Obesity Commended on weight loss - 7 pounds since last visit Continue with exercise as tolerated, reduce portion sizes  This note has been created with Education officer, environmental. Any transcriptional errors are unintentional.

## 2015-12-16 NOTE — Telephone Encounter (Signed)
Met with the patient when he was in the clinic for his appointment today. He said that he is still waiting for a decision about his medicaid and disability. He also reported that the scale he was given from [facility] is not working.  He brought the scale to the clinic and this CM checked the scale and  explained to him that the batteries can become  dislodged and need to be replaced when that happens.. He stated that he understood and would continue to try to use the scale as it was working after the batteries were replaced.

## 2015-12-16 NOTE — Patient Instructions (Signed)
Hypertension Hypertension, commonly called high blood pressure, is when the force of blood pumping through your arteries is too strong. Your arteries are the blood vessels that carry blood from your heart throughout your body. A blood pressure reading consists of a higher number over a lower number, such as 110/72. The higher number (systolic) is the pressure inside your arteries when your heart pumps. The lower number (diastolic) is the pressure inside your arteries when your heart relaxes. Ideally you want your blood pressure below 120/80. Hypertension forces your heart to work harder to pump blood. Your arteries may become narrow or stiff. Having untreated or uncontrolled hypertension can cause heart attack, stroke, kidney disease, and other problems. RISK FACTORS Some risk factors for high blood pressure are controllable. Others are not.  Risk factors you cannot control include:   Race. You may be at higher risk if you are African American.  Age. Risk increases with age.  Gender. Men are at higher risk than women before age 45 years. After age 65, women are at higher risk than men. Risk factors you can control include:  Not getting enough exercise or physical activity.  Being overweight.  Getting too much fat, sugar, calories, or salt in your diet.  Drinking too much alcohol. SIGNS AND SYMPTOMS Hypertension does not usually cause signs or symptoms. Extremely high blood pressure (hypertensive crisis) may cause headache, anxiety, shortness of breath, and nosebleed. DIAGNOSIS To check if you have hypertension, your health care provider will measure your blood pressure while you are seated, with your arm held at the level of your heart. It should be measured at least twice using the same arm. Certain conditions can cause a difference in blood pressure between your right and left arms. A blood pressure reading that is higher than normal on one occasion does not mean that you need treatment. If  it is not clear whether you have high blood pressure, you may be asked to return on a different day to have your blood pressure checked again. Or, you may be asked to monitor your blood pressure at home for 1 or more weeks. TREATMENT Treating high blood pressure includes making lifestyle changes and possibly taking medicine. Living a healthy lifestyle can help lower high blood pressure. You may need to change some of your habits. Lifestyle changes may include:  Following the DASH diet. This diet is high in fruits, vegetables, and whole grains. It is low in salt, red meat, and added sugars.  Keep your sodium intake below 2,300 mg per day.  Getting at least 30-45 minutes of aerobic exercise at least 4 times per week.  Losing weight if necessary.  Not smoking.  Limiting alcoholic beverages.  Learning ways to reduce stress. Your health care provider may prescribe medicine if lifestyle changes are not enough to get your blood pressure under control, and if one of the following is true:  You are 18-59 years of age and your systolic blood pressure is above 140.  You are 60 years of age or older, and your systolic blood pressure is above 150.  Your diastolic blood pressure is above 90.  You have diabetes, and your systolic blood pressure is over 140 or your diastolic blood pressure is over 90.  You have kidney disease and your blood pressure is above 140/90.  You have heart disease and your blood pressure is above 140/90. Your personal target blood pressure may vary depending on your medical conditions, your age, and other factors. HOME CARE INSTRUCTIONS    Have your blood pressure rechecked as directed by your health care provider.   Take medicines only as directed by your health care provider. Follow the directions carefully. Blood pressure medicines must be taken as prescribed. The medicine does not work as well when you skip doses. Skipping doses also puts you at risk for  problems.  Do not smoke.   Monitor your blood pressure at home as directed by your health care provider. SEEK MEDICAL CARE IF:   You think you are having a reaction to medicines taken.  You have recurrent headaches or feel dizzy.  You have swelling in your ankles.  You have trouble with your vision. SEEK IMMEDIATE MEDICAL CARE IF:  You develop a severe headache or confusion.  You have unusual weakness, numbness, or feel faint.  You have severe chest or abdominal pain.  You vomit repeatedly.  You have trouble breathing. MAKE SURE YOU:   Understand these instructions.  Will watch your condition.  Will get help right away if you are not doing well or get worse.   This information is not intended to replace advice given to you by your health care provider. Make sure you discuss any questions you have with your health care provider.   Document Released: 07/19/2005 Document Revised: 12/03/2014 Document Reviewed: 05/11/2013 Elsevier Interactive Patient Education 2016 Elsevier Inc.  

## 2015-12-16 NOTE — Progress Notes (Signed)
2 weeks F/U -surgery  No pain to day  No tobacco user  No suicidal thoughts in the past two weeks

## 2015-12-17 ENCOUNTER — Ambulatory Visit (HOSPITAL_COMMUNITY)
Admission: RE | Admit: 2015-12-17 | Discharge: 2015-12-17 | Disposition: A | Discharge status: Home / Self Care | Payer: Medicaid Other | Source: Ambulatory Visit | Attending: Internal Medicine | Admitting: Internal Medicine

## 2015-12-17 ENCOUNTER — Other Ambulatory Visit: Payer: Self-pay | Admitting: Family Medicine

## 2015-12-17 ENCOUNTER — Encounter (HOSPITAL_COMMUNITY): Payer: Self-pay | Admitting: Internal Medicine

## 2015-12-17 VITALS — BP 113/80 | HR 65 | Resp 18 | Wt 223.0 lb

## 2015-12-17 DIAGNOSIS — Z823 Family history of stroke: Secondary | ICD-10-CM | POA: Insufficient documentation

## 2015-12-17 DIAGNOSIS — Z794 Long term (current) use of insulin: Secondary | ICD-10-CM | POA: Diagnosis not present

## 2015-12-17 DIAGNOSIS — K219 Gastro-esophageal reflux disease without esophagitis: Secondary | ICD-10-CM | POA: Insufficient documentation

## 2015-12-17 DIAGNOSIS — Z833 Family history of diabetes mellitus: Secondary | ICD-10-CM | POA: Insufficient documentation

## 2015-12-17 DIAGNOSIS — I5022 Chronic systolic (congestive) heart failure: Secondary | ICD-10-CM | POA: Diagnosis not present

## 2015-12-17 DIAGNOSIS — I480 Paroxysmal atrial fibrillation: Secondary | ICD-10-CM | POA: Insufficient documentation

## 2015-12-17 DIAGNOSIS — Z79899 Other long term (current) drug therapy: Secondary | ICD-10-CM | POA: Insufficient documentation

## 2015-12-17 DIAGNOSIS — E785 Hyperlipidemia, unspecified: Secondary | ICD-10-CM | POA: Diagnosis not present

## 2015-12-17 DIAGNOSIS — I110 Hypertensive heart disease with heart failure: Secondary | ICD-10-CM | POA: Insufficient documentation

## 2015-12-17 DIAGNOSIS — Z7902 Long term (current) use of antithrombotics/antiplatelets: Secondary | ICD-10-CM | POA: Insufficient documentation

## 2015-12-17 DIAGNOSIS — E7800 Pure hypercholesterolemia, unspecified: Secondary | ICD-10-CM | POA: Insufficient documentation

## 2015-12-17 DIAGNOSIS — E119 Type 2 diabetes mellitus without complications: Secondary | ICD-10-CM | POA: Diagnosis not present

## 2015-12-17 DIAGNOSIS — Z95810 Presence of automatic (implantable) cardiac defibrillator: Secondary | ICD-10-CM | POA: Insufficient documentation

## 2015-12-17 DIAGNOSIS — I255 Ischemic cardiomyopathy: Secondary | ICD-10-CM | POA: Diagnosis not present

## 2015-12-17 DIAGNOSIS — I2510 Atherosclerotic heart disease of native coronary artery without angina pectoris: Secondary | ICD-10-CM | POA: Insufficient documentation

## 2015-12-17 DIAGNOSIS — I4892 Unspecified atrial flutter: Secondary | ICD-10-CM

## 2015-12-17 DIAGNOSIS — Z7982 Long term (current) use of aspirin: Secondary | ICD-10-CM | POA: Diagnosis not present

## 2015-12-17 DIAGNOSIS — I428 Other cardiomyopathies: Secondary | ICD-10-CM | POA: Insufficient documentation

## 2015-12-17 DIAGNOSIS — Z7901 Long term (current) use of anticoagulants: Secondary | ICD-10-CM | POA: Insufficient documentation

## 2015-12-17 DIAGNOSIS — Z8249 Family history of ischemic heart disease and other diseases of the circulatory system: Secondary | ICD-10-CM | POA: Insufficient documentation

## 2015-12-17 LAB — BASIC METABOLIC PANEL
Anion gap: 14 (ref 5–15)
BUN: 25 mg/dL — AB (ref 6–20)
CALCIUM: 9.4 mg/dL (ref 8.9–10.3)
CO2: 20 mmol/L — AB (ref 22–32)
CREATININE: 1.31 mg/dL — AB (ref 0.61–1.24)
Chloride: 106 mmol/L (ref 101–111)
GFR calc non Af Amer: >60 mL/min (ref >60)
GLUCOSE: 114 mg/dL — AB (ref 65–99)
Potassium: 4.5 mmol/L (ref 3.5–5.1)
Sodium: 140 mmol/L (ref 135–145)

## 2015-12-17 LAB — CBC
HCT: 43.4 % (ref 39.0–52.0)
Hemoglobin: 13.7 g/dL (ref 13.0–17.0)
MCH: 24.6 pg — AB (ref 26.0–34.0)
MCHC: 31.6 g/dL (ref 30.0–36.0)
MCV: 77.9 fL — AB (ref 78.0–100.0)
Platelets: 199 10*3/uL (ref 150–400)
RBC: 5.57 MIL/uL (ref 4.22–5.81)
RDW: 16.8 % — AB (ref 11.5–15.5)
WBC: 7.8 10*3/uL (ref 4.0–10.5)

## 2015-12-17 LAB — DIGOXIN LEVEL: Digoxin Level: 0.7 ng/mL — ABNORMAL LOW (ref 0.8–2.0)

## 2015-12-17 MED ORDER — AMIODARONE HCL 200 MG PO TABS: 200.0000 mg | ORAL_TABLET | Freq: Two times a day (BID) | ORAL | Status: DC

## 2015-12-17 MED FILL — WARFARIN NA 2.5 MG TAB: 2.5 | 30 days supply | Qty: 30 | Fill #1

## 2015-12-17 NOTE — Patient Instructions (Addendum)
INCREASE Amiodarone to 200 mg TWICE DAILY.  Routine lab work today. Will notify you of abnormal results  Follow up with Dr.Bensimhon in 1 month

## 2015-12-17 NOTE — Progress Notes (Signed)
Patient ID: Benjamin [name], male   DOB: 1961/05/04, 55 y.o.   MRN: [id number] PCP: Primary Cardiologist: Dr Ladona Ridgel   HPI: [name] is 55 year old male with history of nonischemic cardiomyopathy hypertension, diabetes mellitus, hyperlipidemia, chronic systolic heart failure, S/P St Jude bi-V ICD implanted October 2016. His last echocardiogram was August 2016 ejection fraction was 20% with diffuse hypokinesis. Mild LVH. Mild MR. Left atrium severely dilated.  Admitted in early March with abdominal pain and increased SOB with exertion. Cardiology consulted for surgical clearance. Due to many risk factors he was sent for [facility]. LHC showed complex, calcified, multivessel coronary disease with high-grade obstruction in the mid LAD and distal RCA. He had laparoscopic cholecystectomy. Plan for 2V PCI by Dr Katrinka Blazing after he recovered from cholecystectomy. Diuresed with IV lasix and transitioned to lasix 40 mg daily. Discharge weight was 243 pounds.   Admitted in April with AFL. Converted with amio. And then underwent 2-V PCI with rotoblator under Impella support. Now feels much better. Breathing better. No swelling. Back to work selling cars. + orthopnea No PND. Weight stable 222-224 . BP can go as low as 95 and gets dizzy. Feels he has to skip medicine. On warfarin and Plavix. Off aspirin. No bleeding.   PFTS 5/17 FEV1 2.11 (61%) FVC 3.06 (68%) DLCO 72%  Labs 4/17  K 4.5 Cr 1.25  LHC 10/07/2015  - Dr Katrinka Blazing  Prox RCA to Mid RCA lesion, 20% stenosed.  Dist RCA lesion, 90% stenosed.  RPDA lesion, 90% stenosed.  1st Diag lesion, 100% stenosed.  Mid LAD lesion, 95% stenosed.  Dist LAD lesion, 90% stenosed.  Ost Cx lesion, 60% stenosed.  2nd Mrg lesion, 95% stenosed.  Prox Cx to Mid Cx lesion, 60% stenosed.  LVEF 20-25%.      ROS: All systems negative except as listed in HPI, PMH and Problem List.  SH:  Social History   Social History  . Marital Status: Married    Spouse Name: N/A  .  Number of Children: N/A  . Years of Education: N/A   Occupational History  . Not on file.   Social History Main Topics  . Smoking status: Never Smoker   . Smokeless tobacco: Never Used  . Alcohol Use: No  . Drug Use: No  . Sexual Activity: No   Other Topics Concern  . Not on file   Social History Narrative    FH:  Family History  Problem Relation Age of Onset  . Diabetes Mother   . Hypertension Mother   . Hyperlipidemia Mother   . Stroke Mother   . Diabetes Father   . Hypertension Father   . Stroke Father 42    Past Medical History  Diagnosis Date  . Hypertension   . High cholesterol   . Cholelithiasis   . Chronic systolic heart failure (HCC)   . AICD (automatic cardioverter/defibrillator) present   . Kidney stone   . Pneumonia 2002  . Type II diabetes mellitus (HCC)   . GERD (gastroesophageal reflux disease)   . Coronary artery disease     distal vessel disease    Current Outpatient Prescriptions  Medication Sig Dispense Refill  . amiodarone (PACERONE) 200 MG tablet Take 2 tablets twice a day until 4/13, starting 4/14 take 1 tablet daily (Patient taking differently: Take 200 mg by mouth daily. ) 45 tablet 5  . aspirin EC 81 MG tablet Take 81 mg by mouth daily.    . carvedilol (COREG) 6.25 MG tablet Take  1 tablet (6.25 mg total) by mouth 2 (two) times daily with a meal. 60 tablet 3  . clopidogrel (PLAVIX) 75 MG tablet Take 1 tablet (75 mg total) by mouth daily. 90 tablet 3  . digoxin (LANOXIN) 0.125 MG tablet Take 1 tablet (0.125 mg total) by mouth daily. 30 tablet 2  . furosemide (LASIX) 20 MG tablet Take 2 tablets (40 mg total) by mouth daily. 30 tablet 3  . insulin aspart (NOVOLOG FLEXPEN) 100 UNIT/ML FlexPen Inject 5-10 Units into the skin 2 (two) times daily as needed for high blood sugar (CBG >140). Dose is based on size of meal    . insulin glargine (LANTUS) 100 unit/mL SOPN Inject 22-23 Units into the skin at bedtime.    Marland Kitchen lisinopril (PRINIVIL,ZESTRIL)  5 MG tablet Take 1 tablet (5 mg total) by mouth daily. 30 tablet 2  . metFORMIN (GLUCOPHAGE) 1000 MG tablet Take 1,000 mg by mouth daily with lunch.     . nitroGLYCERIN (NITROSTAT) 0.4 MG SL tablet Place 1 tablet (0.4 mg total) under the tongue every 5 (five) minutes x 3 doses as needed for chest pain. 25 tablet 3  . potassium chloride (KLOR-CON) 20 MEQ packet Take 20 mEq by mouth every other day.    . pregabalin (LYRICA) 75 MG capsule Take 150 mg by mouth at bedtime.    . rosuvastatin (CRESTOR) 20 MG tablet Take 1 tablet (20 mg total) by mouth daily. 90 tablet 3  . warfarin (COUMADIN) 2.5 MG tablet Take 1 tablet (2.5 mg total) by mouth daily. 30 tablet 5   No current facility-administered medications for this encounter.    Filed Vitals:   12/17/15 1357  BP: 113/80  Pulse: 65  Resp: 18  Weight: 223 lb (101.152 kg)  SpO2: 98%    PHYSICAL EXAM: General:  Well appearing. No resp difficulty HEENT: normal Neck: supple. JVP flat. Carotids 2+ bilaterally; no bruits. No lymphadenopathy or thryomegaly appreciated. Cor: PMI normal. Irregular rate & rhythm. No rubs, gallops or murmurs. Lungs: clear Abdomen: obese, soft, nontender, nondistended. No hepatosplenomegaly. No bruits or masses. Good bowel sounds. Extremities: no cyanosis, clubbing, rash, edema Neuro: alert & orientedx3, cranial nerves grossly intact. Moves all 4 extremities w/o difficulty. Affect pleasant.    ASSESSMENT & PLAN: 1. Chronic  Systolic Heart Failure-Mixed ICM/NICM. ECHO 2016 with EF 20%. Has St Jude ICD.  - NYHA II.  - Volume status stable. Continue lasix 40 mg daily - Continue carvedilol 6.25 mg twice a day. Continue dig 0.125 mg daily.  - Continue lisinopril 5 daily. - BP too low to titrate or switch to entresto - He has had hyperkalemia in the past with spiro  and does not want to take it.  - Reinfoced low salt diet, daily weights, and limiting fluid intake to < 2 liters per day.  2.CAD- Multivessel CAD- LHC  10/2015 with Complex, calcified, multivessel coronary disease with high-grade obstruction in the mid LAD and distal RCA.  - s/p 2v PC in 4./17 - On aspirin, plavix and warfarin, crestor, bb. Followed by Dr. Katrinka Blazing. Hopefully we can drop ASA in 1 month post-PCI.  3. PAF/AFL - back in AFL today. I have discussed with Dr. Ladona Ridgel who will see him back for possible ablation. Double amio.  4. HTN- stable- BP actually low at times 5. Hyperlipidemia- Continue crestor daily  Arvilla Meres MD  2:14 PM

## 2015-12-18 MED FILL — AMIODARONE HCL 200 MG TAB: 200 | 30 days supply | Qty: 60 | Fill #0

## 2015-12-19 ENCOUNTER — Telehealth: Payer: Self-pay

## 2015-12-19 ENCOUNTER — Ambulatory Visit (INDEPENDENT_AMBULATORY_CARE_PROVIDER_SITE_OTHER): Payer: Medicaid Other | Admitting: *Deleted

## 2015-12-19 DIAGNOSIS — I4892 Unspecified atrial flutter: Secondary | ICD-10-CM

## 2015-12-19 LAB — POCT INR: INR: 1.6

## 2015-12-19 NOTE — Telephone Encounter (Signed)
Called placed to patient, patient did not answer. Message left for the patient to return my call.

## 2015-12-19 NOTE — Telephone Encounter (Signed)
-----   Message from Jaclyn Shaggy, MD sent at 12/12/2015  2:14 PM EDT ----- Please inform him his pulmonary function test reveals moderate obstructive airway disease and mild restrictive disease ; if he does have any symptoms of shortness of breath we could try using a bronchodilator to relieve symptoms but if he is asymptomatic we would just observe.

## 2015-12-22 ENCOUNTER — Other Ambulatory Visit: Payer: Self-pay | Admitting: Family Medicine

## 2015-12-22 DIAGNOSIS — J449 Chronic obstructive pulmonary disease, unspecified: Secondary | ICD-10-CM

## 2015-12-22 MED ORDER — ALBUTEROL SULFATE HFA 108 (90 BASE) MCG/ACT IN AERS
2.0000 | INHALATION_SPRAY | Freq: Four times a day (QID) | RESPIRATORY_TRACT | Status: AC | PRN
Start: 2015-12-22 — End: ?

## 2015-12-22 MED FILL — VENTOLIN HFA 90 MCG INHALER: 108 (90 BAS | 25 days supply | Qty: 18 | Fill #0

## 2015-12-22 NOTE — Telephone Encounter (Signed)
Place call to patient, patient verified name and DOB. Patient reports still having moderates SOB and requesting an inhaler. Per Dr. Venetia Night, patient will be placed on a bronchodilator to relieve sxs.

## 2015-12-22 NOTE — Telephone Encounter (Signed)
Description for Proventil MDI sent to the pharmacy.

## 2015-12-23 ENCOUNTER — Telehealth: Payer: Self-pay

## 2015-12-23 NOTE — Telephone Encounter (Signed)
Place call to patient, patient did not answer. Message was left for the patient to return my call.  Note to patient:  Please let patient know that Dr. Venetia Night sent over an inhaler for him.

## 2015-12-26 ENCOUNTER — Ambulatory Visit (INDEPENDENT_AMBULATORY_CARE_PROVIDER_SITE_OTHER): Payer: Medicaid Other | Admitting: *Deleted

## 2015-12-26 ENCOUNTER — Other Ambulatory Visit: Payer: Self-pay | Admitting: Family Medicine

## 2015-12-26 DIAGNOSIS — I4892 Unspecified atrial flutter: Secondary | ICD-10-CM

## 2015-12-26 LAB — POCT INR: INR: 3

## 2015-12-31 ENCOUNTER — Other Ambulatory Visit: Payer: Self-pay | Admitting: Family Medicine

## 2016-01-06 ENCOUNTER — Other Ambulatory Visit: Payer: Self-pay | Admitting: Family Medicine

## 2016-01-06 MED FILL — CLOPIDOGREL 75 MG TABLET: 75 | 30 days supply | Qty: 30 | Fill #2

## 2016-01-07 ENCOUNTER — Telehealth: Payer: Self-pay | Admitting: Internal Medicine

## 2016-01-07 ENCOUNTER — Other Ambulatory Visit: Payer: Self-pay | Admitting: Family Medicine

## 2016-01-07 ENCOUNTER — Encounter: Payer: Self-pay | Admitting: Internal Medicine

## 2016-01-07 DIAGNOSIS — I10 Essential (primary) hypertension: Secondary | ICD-10-CM

## 2016-01-07 MED ORDER — LISINOPRIL 5 MG PO TABS: 5.0000 mg | ORAL_TABLET | Freq: Every day | ORAL | Status: DC

## 2016-01-07 MED FILL — LISINOPRIL 5 MG TABLET: 5 | 30 days supply | Qty: 30 | Fill #0

## 2016-01-07 NOTE — Telephone Encounter (Signed)
Patient is needing lisinopril. Please follow

## 2016-01-08 NOTE — Telephone Encounter (Signed)
Lisinopril refilled by Dr. Venetia Night yesterday 01/07/16

## 2016-01-09 ENCOUNTER — Ambulatory Visit (INDEPENDENT_AMBULATORY_CARE_PROVIDER_SITE_OTHER): Payer: Medicaid Other | Admitting: *Deleted

## 2016-01-09 DIAGNOSIS — I4892 Unspecified atrial flutter: Secondary | ICD-10-CM

## 2016-01-09 LAB — POCT INR: INR: 2.6

## 2016-01-12 ENCOUNTER — Emergency Department (HOSPITAL_BASED_OUTPATIENT_CLINIC_OR_DEPARTMENT_OTHER): Payer: Medicaid Other

## 2016-01-12 ENCOUNTER — Emergency Department (HOSPITAL_BASED_OUTPATIENT_CLINIC_OR_DEPARTMENT_OTHER)
Admission: EM | Admit: 2016-01-12 | Discharge: 2016-01-12 | Disposition: A | Discharge status: Home / Self Care | Payer: Medicaid Other | Attending: Emergency Medicine | Admitting: Emergency Medicine

## 2016-01-12 ENCOUNTER — Encounter (HOSPITAL_BASED_OUTPATIENT_CLINIC_OR_DEPARTMENT_OTHER): Payer: Self-pay

## 2016-01-12 DIAGNOSIS — I110 Hypertensive heart disease with heart failure: Secondary | ICD-10-CM | POA: Diagnosis not present

## 2016-01-12 DIAGNOSIS — Z7982 Long term (current) use of aspirin: Secondary | ICD-10-CM | POA: Diagnosis not present

## 2016-01-12 DIAGNOSIS — Z79899 Other long term (current) drug therapy: Secondary | ICD-10-CM | POA: Diagnosis not present

## 2016-01-12 DIAGNOSIS — I2510 Atherosclerotic heart disease of native coronary artery without angina pectoris: Secondary | ICD-10-CM | POA: Insufficient documentation

## 2016-01-12 DIAGNOSIS — I509 Heart failure, unspecified: Secondary | ICD-10-CM | POA: Diagnosis not present

## 2016-01-12 DIAGNOSIS — R0602 Shortness of breath: Secondary | ICD-10-CM | POA: Diagnosis present

## 2016-01-12 DIAGNOSIS — Z7984 Long term (current) use of oral hypoglycemic drugs: Secondary | ICD-10-CM | POA: Insufficient documentation

## 2016-01-12 DIAGNOSIS — E7800 Pure hypercholesterolemia, unspecified: Secondary | ICD-10-CM | POA: Diagnosis not present

## 2016-01-12 DIAGNOSIS — R609 Edema, unspecified: Secondary | ICD-10-CM | POA: Insufficient documentation

## 2016-01-12 DIAGNOSIS — Z794 Long term (current) use of insulin: Secondary | ICD-10-CM | POA: Diagnosis not present

## 2016-01-12 LAB — CBC WITH DIFFERENTIAL/PLATELET
BASOS ABS: 0 10*3/uL (ref 0.0–0.1)
Basophils Relative: 0 %
Eosinophils Absolute: 0.2 10*3/uL (ref 0.0–0.7)
Eosinophils Relative: 3 %
HEMATOCRIT: 38.2 % — AB (ref 39.0–52.0)
HEMOGLOBIN: 12.4 g/dL — AB (ref 13.0–17.0)
LYMPHS ABS: 1.3 10*3/uL (ref 0.7–4.0)
LYMPHS PCT: 19 %
MCH: 24 pg — ABNORMAL LOW (ref 26.0–34.0)
MCHC: 32.5 g/dL (ref 30.0–36.0)
MCV: 74 fL — AB (ref 78.0–100.0)
Monocytes Absolute: 0.8 10*3/uL (ref 0.1–1.0)
Monocytes Relative: 12 %
NEUTROS ABS: 4.7 10*3/uL (ref 1.7–7.7)
Neutrophils Relative %: 66 %
Platelets: 231 10*3/uL (ref 150–400)
RBC: 5.16 MIL/uL (ref 4.22–5.81)
RDW: 18.3 % — ABNORMAL HIGH (ref 11.5–15.5)
WBC: 7 10*3/uL (ref 4.0–10.5)

## 2016-01-12 LAB — BASIC METABOLIC PANEL
Anion gap: 10 (ref 5–15)
BUN: 36 mg/dL — AB (ref 6–20)
CO2: 20 mmol/L — ABNORMAL LOW (ref 22–32)
CREATININE: 1.68 mg/dL — AB (ref 0.61–1.24)
Calcium: 8.7 mg/dL — ABNORMAL LOW (ref 8.9–10.3)
Chloride: 109 mmol/L (ref 101–111)
GFR calc Af Amer: 52 mL/min — ABNORMAL LOW (ref >60)
GFR, EST NON AFRICAN AMERICAN: 45 mL/min — AB (ref >60)
GLUCOSE: 133 mg/dL — AB (ref 65–99)
POTASSIUM: 3.8 mmol/L (ref 3.5–5.1)
SODIUM: 139 mmol/L (ref 135–145)

## 2016-01-12 LAB — PROTIME-INR
INR: 2.22 — ABNORMAL HIGH (ref 0.00–1.49)
Prothrombin Time: 24.4 seconds — ABNORMAL HIGH (ref 11.6–15.2)

## 2016-01-12 LAB — BRAIN NATRIURETIC PEPTIDE: B Natriuretic Peptide: 1003.5 pg/mL — ABNORMAL HIGH (ref 0.0–100.0)

## 2016-01-12 MED ORDER — FUROSEMIDE 10 MG/ML IJ SOLN
80.0000 mg | Freq: Once | INTRAMUSCULAR | Status: AC
Start: 2016-01-12 — End: 2016-01-12
  Administered 2016-01-12: 80 mg via INTRAVENOUS
  Filled 2016-01-12: qty 8

## 2016-01-12 MED ORDER — POTASSIUM CHLORIDE CRYS ER 20 MEQ PO TBCR
40.0000 meq | EXTENDED_RELEASE_TABLET | Freq: Once | ORAL | Status: AC
  Administered 2016-01-12: 40 meq via ORAL
  Filled 2016-01-12: qty 2

## 2016-01-12 MED ORDER — FUROSEMIDE 80 MG PO TABS: 80.0000 mg | ORAL_TABLET | Freq: Every day | ORAL | Status: DC

## 2016-01-12 NOTE — ED Notes (Signed)
Pt requesting to speak with MD. Dr. Mora Bellman aware and in to see pt.

## 2016-01-12 NOTE — ED Notes (Signed)
Pt transported to XR at this time.

## 2016-01-12 NOTE — ED Notes (Signed)
Transported to xray 

## 2016-01-12 NOTE — ED Notes (Signed)
Pt reports increased shortness of breath in the past week. Has been taking his lasix and reports weight gain of 5 lbs over the last week. Reports increased swelling in the ankles. Also reports difficulty sleeping at night, sts having to sit up in a chair.

## 2016-01-12 NOTE — ED Provider Notes (Signed)
CSN: [id number]     Arrival date & time 01/12/16  0320 History   First MD Initiated Contact with Patient 01/12/16 [phone]     Chief Complaint  Patient presents with  . Shortness of Breath     (Consider location/radiation/quality/duration/timing/severity/associated sxs/prior Treatment) HPI   Benjamin [name] is a 55 y.o. male with PMH of HTN, DM, HLD, CHF s/p AICD, AF on coumadin, here with worsening SOB over the last week.  He has gained 6 lbs this week and was instructed to come to the ED if that happens. He has been compliant with al medications. He states he has had worsening sleep orthopnea, leg swelling, and DOE.  He denies any chest pain.  He has no cough or fevers. There are no further complaints.  10 Systems reviewed and are negative for acute change except as noted in the HPI.    Past Medical History  Diagnosis Date  . Hypertension   . High cholesterol   . Cholelithiasis   . Chronic systolic heart failure (HCC)   . AICD (automatic cardioverter/defibrillator) present   . Kidney stone   . Pneumonia 2002  . Type II diabetes mellitus (HCC)   . GERD (gastroesophageal reflux disease)   . Coronary artery disease     distal vessel disease   Past Surgical History  Procedure Laterality Date  . Colonoscopy N/A 07/12/2013    Procedure: COLONOSCOPY;  Surgeon: Shirley Friar, MD;  Location: [facility] ENDOSCOPY;  Service: Endoscopy;  Laterality: N/A;  . Ep implantable device N/A 05/09/2015    Procedure: BiV ICD Insertion CRT-D;  Surgeon: Marinus Maw, MD;  Location: [facility] INVASIVE CV LAB;  Service: Cardiovascular;  Laterality: N/A;  . Cholecystectomy  10/09/2015    Procedure: LAPAROSCOPIC CHOLECYSTECTOMY;  Surgeon: Claud Kelp, MD;  Location: [facility] OR;  Service: General;;  . Cardiac catheterization N/A 10/07/2015    Procedure: Left Heart Cath and Coronary Angiography;  Surgeon: Lyn Records, MD;  Location: [facility] INVASIVE CV LAB;  Service: Cardiovascular;  Laterality: N/A;  . Cardiac catheterization  N/A 11/05/2015    Procedure: Coronary Stent Intervention Rotablater;  Surgeon: Lyn Records, MD;  Location: [facility] INVASIVE CV LAB;  Service: Cardiovascular;  Laterality: N/A;   Family History  Problem Relation Age of Onset  . Diabetes Mother   . Hypertension Mother   . Hyperlipidemia Mother   . Stroke Mother   . Diabetes Father   . Hypertension Father   . Stroke Father 58   Social History  Substance Use Topics  . Smoking status: Never Smoker   . Smokeless tobacco: Never Used  . Alcohol Use: No    Review of Systems    Allergies  Heparin; Lovenox; and Pork-derived products  Home Medications   Prior to Admission medications   Medication Sig Start Date End Date Taking? Authorizing Provider  albuterol (PROVENTIL HFA;VENTOLIN HFA) 108 (90 Base) MCG/ACT inhaler Inhale 2 puffs into the lungs every 6 (six) hours as needed for wheezing or shortness of breath. 12/22/15   Jaclyn Shaggy, MD  amiodarone (PACERONE) 200 MG tablet Take 1 tablet (200 mg total) by mouth 2 (two) times daily. 12/17/15   Dolores Patty, MD  aspirin EC 81 MG tablet Take 81 mg by mouth daily.    Historical Provider, MD  carvedilol (COREG) 6.25 MG tablet Take 1 tablet (6.25 mg total) by mouth 2 (two) times daily with a meal. 12/02/15   Jaclyn Shaggy, MD  clopidogrel (PLAVIX) 75 MG tablet Take 1  tablet (75 mg total) by mouth daily. 11/09/15   Azalee Course, PA  digoxin (LANOXIN) 0.125 MG tablet Take 1 tablet (0.125 mg total) by mouth daily. 11/17/15   Jaclyn Shaggy, MD  furosemide (LASIX) 20 MG tablet Take 2 tablets (40 mg total) by mouth daily. 12/02/15   Jaclyn Shaggy, MD  insulin aspart (NOVOLOG FLEXPEN) 100 UNIT/ML FlexPen Inject 5-10 Units into the skin 2 (two) times daily as needed for high blood sugar (CBG >140). Dose is based on size of meal    Historical Provider, MD  insulin glargine (LANTUS) 100 unit/mL SOPN Inject 22-23 Units into the skin at bedtime.    Historical Provider, MD  lisinopril (PRINIVIL,ZESTRIL) 5 MG tablet Take  1 tablet (5 mg total) by mouth daily. 01/07/16   Jaclyn Shaggy, MD  metFORMIN (GLUCOPHAGE) 1000 MG tablet Take 1,000 mg by mouth daily with lunch.     Historical Provider, MD  nitroGLYCERIN (NITROSTAT) 0.4 MG SL tablet Place 1 tablet (0.4 mg total) under the tongue every 5 (five) minutes x 3 doses as needed for chest pain. 11/09/15   Azalee Course, PA  potassium chloride (KLOR-CON) 20 MEQ packet Take 20 mEq by mouth every other day.    Historical Provider, MD  pregabalin (LYRICA) 75 MG capsule Take 150 mg by mouth at bedtime.    Historical Provider, MD  rosuvastatin (CRESTOR) 20 MG tablet Take 1 tablet (20 mg total) by mouth daily. 08/22/15   Quentin Angst, MD  warfarin (COUMADIN) 2.5 MG tablet Take 1 tablet (2.5 mg total) by mouth daily. 11/09/15   Azalee Course, PA   BP 122/86 mmHg  Pulse 101  Temp(Src) 98 F (36.7 C) (Oral)  Resp 31  Ht 5\' 7"  (1.702 m)  Wt 231 lb (104.781 kg)  BMI 36.17 kg/m2  SpO2 95% Physical Exam  Constitutional: He is oriented to person, place, and time. Vital signs are normal. He appears well-developed and well-nourished.  Non-toxic appearance. He does not appear ill. No distress.  HENT:  Head: Normocephalic and atraumatic.  Nose: Nose normal.  Mouth/Throat: Oropharynx is clear and moist. No oropharyngeal exudate.  Eyes: Conjunctivae and EOM are normal. Pupils are equal, round, and reactive to light. No scleral icterus.  Neck: Normal range of motion. Neck supple. No tracheal deviation, no edema, no erythema and normal range of motion present. No thyroid mass and no thyromegaly present.  Cardiovascular: Regular rhythm, S1 normal, S2 normal, normal heart sounds, intact distal pulses and normal pulses.  Exam reveals no gallop and no friction rub.   No murmur heard. Tachycardia   Pulmonary/Chest: Effort normal and breath sounds normal. No respiratory distress. He has no wheezes. He has no rhonchi. He has no rales.  AICD in L chest  Abdominal: Soft. Normal appearance and bowel  sounds are normal. He exhibits no distension, no ascites and no mass. There is no hepatosplenomegaly. There is no tenderness. There is no rebound, no guarding and no CVA tenderness.  Musculoskeletal: Normal range of motion. He exhibits edema. He exhibits no tenderness.  Lymphadenopathy:    He has no cervical adenopathy.  Neurological: He is alert and oriented to person, place, and time. He has normal strength. No cranial nerve deficit or sensory deficit.  Skin: Skin is warm, dry and intact. No petechiae and no rash noted. He is not diaphoretic. No erythema. No pallor.  Psychiatric: He has a normal mood and affect. His behavior is normal. Judgment normal.  Nursing note and vitals reviewed.  ED Course  Procedures (including critical care time) Labs Review Labs Reviewed  CBC WITH DIFFERENTIAL/PLATELET - Abnormal; Notable for the following:    Hemoglobin 12.4 (*)    HCT 38.2 (*)    MCV 74.0 (*)    MCH 24.0 (*)    RDW 18.3 (*)    All other components within normal limits  BRAIN NATRIURETIC PEPTIDE - Abnormal; Notable for the following:    B Natriuretic Peptide 1003.5 (*)    All other components within normal limits  BASIC METABOLIC PANEL - Abnormal; Notable for the following:    CO2 20 (*)    Glucose, Bld 133 (*)    BUN 36 (*)    Creatinine, Ser 1.68 (*)    Calcium 8.7 (*)    GFR calc non Af Amer 45 (*)    GFR calc Af Amer 52 (*)    All other components within normal limits  PROTIME-INR - Abnormal; Notable for the following:    Prothrombin Time 24.4 (*)    INR 2.22 (*)    All other components within normal limits    Imaging Review Dg Chest 2 View  01/12/2016  CLINICAL DATA:  Acute onset of shortness of breath and weight gain. Lower extremity swelling. Initial encounter. EXAM: CHEST  2 VIEW COMPARISON:  Chest radiograph performed 11/15/2015 FINDINGS: The lungs are well-aerated. Vascular congestion is noted. Mildly increased interstitial markings could reflect minimal interstitial  edema. There is no evidence of pleural effusion or pneumothorax. The heart is enlarged. A pacemaker/AICD is noted at the left chest wall, with leads ending at the right atrium, right ventricle and coronary sinus. No acute osseous abnormalities are seen. Clips are noted within the right upper quadrant, reflecting prior cholecystectomy. IMPRESSION: Vascular congestion and cardiomegaly. Mildly increased interstitial markings could reflect minimal interstitial edema. Electronically Signed   By: Roanna Raider M.D.   On: 01/12/2016 03:56   I have personally reviewed and evaluated these images and lab results as part of my medical decision-making.   EKG Interpretation   Date/Time:  Monday January 12 2016 03:29:14 EDT Ventricular Rate:  101 PR Interval:  106 QRS Duration: 170 QT Interval:  410 QTC Calculation: 531 R Axis:   -124 Text Interpretation:  Sinus tachycardia Premature ventricular complexes  Non-specific intra-ventricular conduction delay Prolonged QT No  significant change since last tracing Confirmed by Erroll Luna  [id number]) on 01/12/2016 3:44:26 AM      MDM   Final diagnoses:  None    Patient presents to the ED for worsening CHF.  Likely CHF exacerbation.  CXR reveals fluid in the lungs.  EKG is unchanged from previous.  Patient given  IV lasix.  He has no hypoxia and is 95% on RA.  Will continue to monitor for his response to therapy.  Patient diuresed 500cc.  States his breathing has improved as well.  Will dc with  lasix to take x 5 days, then back to normal regimine.  Cards follow up within 3 days.  He appears well and in NAD.  VS remain within his normal limits and he is safe for DC.   Tomasita Crumble, MD 01/12/16 [phone]

## 2016-01-12 NOTE — Discharge Instructions (Signed)
Heart Failure Benjamin [name], your chest xray shows fluid in your lungs.  Take 80mg  lasix daily for 5 days, then go back to your normal dose.  See your cardiologist within 3 days for close follow up.  If symptoms worsen, come back to the ED immediately. Thank you. Heart failure means your heart has trouble pumping blood. This makes it hard for your body to work well. Heart failure is usually a long-term (chronic) condition. You must take good care of yourself and follow your doctor's treatment plan. HOME CARE  Take your heart medicine as told by your doctor.  Do not stop taking medicine unless your doctor tells you to.  Do not skip any dose of medicine.  Refill your medicines before they run out.  Take other medicines only as told by your doctor or pharmacist.  Stay active if told by your doctor. The elderly and people with severe heart failure should talk with a doctor about physical activity.  Eat heart-healthy foods. Choose foods that are without trans fat and are low in saturated fat, cholesterol, and salt (sodium). This includes fresh or frozen fruits and vegetables, fish, lean meats, fat-free or low-fat dairy foods, whole grains, and high-fiber foods. Lentils and dried peas and beans (legumes) are also good choices.  Limit salt if told by your doctor.  Cook in a healthy way. Roast, grill, broil, bake, poach, steam, or stir-fry foods.  Limit fluids as told by your doctor.  Weigh yourself every morning. Do this after you pee (urinate) and before you eat breakfast. Write down your weight to give to your doctor.  Take your blood pressure and write it down if your doctor tells you to.  Ask your doctor how to check your pulse. Check your pulse as told.  Lose weight if told by your doctor.  Stop smoking or chewing tobacco. Do not use gum or patches that help you quit without your doctor's approval.  Schedule and go to doctor visits as told.  Nonpregnant women should have no more than 1  drink a day. Men should have no more than 2 drinks a day. Talk to your doctor about drinking alcohol.  Stop illegal drug use.  Stay current with shots (immunizations).  Manage your health conditions as told by your doctor.  Learn to manage your stress.  Rest when you are tired.  If it is really hot outside:  Avoid intense activities.  Use air conditioning or fans, or get in a cooler place.  Avoid caffeine and alcohol.  Wear loose-fitting, lightweight, and light-colored clothing.  If it is really cold outside:  Avoid intense activities.  Layer your clothing.  Wear mittens or gloves, a hat, and a scarf when going outside.  Avoid alcohol.  Learn about heart failure and get support as needed.  Get help to maintain or improve your quality of life and your ability to care for yourself as needed. GET HELP IF:   You gain weight quickly.  You are more short of breath than usual.  You cannot do your normal activities.  You tire easily.  You cough more than normal, especially with activity.  You have any or more puffiness (swelling) in areas such as your hands, feet, ankles, or belly (abdomen).  You cannot sleep because it is hard to breathe.  You feel like your heart is beating fast (palpitations).  You get dizzy or light-headed when you stand up. GET HELP RIGHT AWAY IF:   You have trouble breathing.  There  is a change in mental status, such as becoming less alert or not being able to focus.  You have chest pain or discomfort.  You faint. MAKE SURE YOU:   Understand these instructions.  Will watch your condition.  Will get help right away if you are not doing well or get worse.   This information is not intended to replace advice given to you by your health care provider. Make sure you discuss any questions you have with your health care provider.   Document Released: 04/27/2008 Document Revised: 08/09/2014 Document Reviewed: 09/04/2012 Elsevier  Interactive Patient Education [employer]

## 2016-01-13 MED FILL — ?DIGITEK 125 MCG TABLET: 125 | 30 days supply | Qty: 30 | Fill #2

## 2016-01-13 MED FILL — ?FUROSEMIDE 80MG TABLET: 80 | 5 days supply | Qty: 5 | Fill #0

## 2016-01-19 ENCOUNTER — Telehealth (HOSPITAL_COMMUNITY): Payer: Self-pay | Admitting: Vascular Surgery

## 2016-01-19 NOTE — Telephone Encounter (Signed)
Left pt message to move f/u appt w db

## 2016-01-20 ENCOUNTER — Telehealth (HOSPITAL_COMMUNITY): Payer: Self-pay | Admitting: Vascular Surgery

## 2016-01-20 NOTE — Progress Notes (Signed)
Cardiology Admission Note    Date:  01/21/2016   ID:  Benjamin [name], DOB 1960-10-06, MRN [id number]  PCP:  Jeanann Lewandowsky, MD  Cardiologist: Lesleigh Noe, MD   Chief Complaint  Patient presents with  . Congestive Heart Failure  . Atrial Fibrillation    History of Present Illness:  Benjamin [name] is a 55 y.o. male with history of mixed ischemic and nonischemic cardiomyopathy, EF 20%, chronic severe systolic heart failure, history of atrial flutter, chronic kidney disease stage III, obesity, sleep apnea, and diabetes mellitus. He has been seen in the advanced heart failure clinic by Dr. Gala Romney. He underwent high risk PCI with LAD and right coronary rotational atherectomy and stenting in April 2017. Clinical symptomatology dramatically improved after resolution of atrial flutter and the stent procedure. Baseline weight had decreased to around 225 pounds.  When seen in the heart failure clinic on 17 May, he was noted to be in atrial flutter at that time. Amiodarone therapy was increased to 400 mg per day. Over the past month he has developed progressive dyspnea and fatigue. He has mild orthopnea.  Today he comes in for routine follow-up after the percutaneous coronary intervention and his major complaint is exertional dyspnea, fatigue, and orthopnea. He had a recent emergency room visit on 01/12/16 when he was felt to have acute on chronic heart failure. Furosemide was increased to 80 mg per day for 5 days and back to 40 mg daily. He was still in atrial flutter at that time. BNP was greater than 1000. Chest x-ray reveals CHF. He does not feel any better. He may have some component of excess oral fluid intake. In the clinic today he is still in atrial flutter with a much better controlled ventricular response at 76 bpm with an atrial flutter rate approximately 200 bpm. I discussed the patient with Dr. Gala Romney and we both agree that the patient's rhythm needs to be converted after IV  diuresis. I have recommended admission to the hospital however he states that he will prefer to go tomorrow morning.  Past Medical History  Diagnosis Date  . Hypertension   . High cholesterol   . Cholelithiasis   . Chronic systolic heart failure (HCC)   . AICD (automatic cardioverter/defibrillator) present   . Kidney stone   . Pneumonia 2002  . Type II diabetes mellitus (HCC)   . GERD (gastroesophageal reflux disease)   . Coronary artery disease     distal vessel disease    Past Surgical History  Procedure Laterality Date  . Colonoscopy N/A 07/12/2013    Procedure: COLONOSCOPY;  Surgeon: Shirley Friar, MD;  Location: [facility];  Service: Endoscopy;  Laterality: N/A;  . Ep implantable device N/A 05/09/2015    Procedure: BiV ICD Insertion CRT-D;  Surgeon: Marinus Maw, MD;  Location: [facility] INVASIVE CV LAB;  Service: Cardiovascular;  Laterality: N/A;  . Cholecystectomy  10/09/2015    Procedure: LAPAROSCOPIC CHOLECYSTECTOMY;  Surgeon: Claud Kelp, MD;  Location: [facility] OR;  Service: General;;  . Cardiac catheterization N/A 10/07/2015    Procedure: Left Heart Cath and Coronary Angiography;  Surgeon: Lyn Records, MD;  Location: [facility] INVASIVE CV LAB;  Service: Cardiovascular;  Laterality: N/A;  . Cardiac catheterization N/A 11/05/2015    Procedure: Coronary Stent Intervention Rotablater;  Surgeon: Lyn Records, MD;  Location: [facility] INVASIVE CV LAB;  Service: Cardiovascular;  Laterality: N/A;    Current Medications: Outpatient Prescriptions Prior to Visit  Medication Sig Dispense  Refill  . albuterol (PROVENTIL HFA;VENTOLIN HFA) 108 (90 Base) MCG/ACT inhaler Inhale 2 puffs into the lungs every 6 (six) hours as needed for wheezing or shortness of breath. 1 Inhaler 0  . amiodarone (PACERONE) 200 MG tablet Take 1 tablet (200 mg total) by mouth 2 (two) times daily. 60 tablet 5  . carvedilol (COREG) 6.25 MG tablet Take 1 tablet (6.25 mg total) by mouth 2 (two) times daily with a meal. 60 tablet 3   . clopidogrel (PLAVIX) 75 MG tablet Take 1 tablet (75 mg total) by mouth daily. 90 tablet 3  . digoxin (LANOXIN) 0.125 MG tablet Take 1 tablet (0.125 mg total) by mouth daily. 30 tablet 2  . insulin aspart (NOVOLOG FLEXPEN) 100 UNIT/ML FlexPen Inject 5-10 Units into the skin 2 (two) times daily as needed for high blood sugar (CBG >140). Dose is based on size of meal    . insulin glargine (LANTUS) 100 unit/mL SOPN Inject 22-23 Units into the skin at bedtime.    Marland Kitchen lisinopril (PRINIVIL,ZESTRIL) 5 MG tablet Take 1 tablet (5 mg total) by mouth daily. 30 tablet 2  . metFORMIN (GLUCOPHAGE) 1000 MG tablet Take 1,000 mg by mouth daily with lunch.     . nitroGLYCERIN (NITROSTAT) 0.4 MG SL tablet Place 1 tablet (0.4 mg total) under the tongue every 5 (five) minutes x 3 doses as needed for chest pain. 25 tablet 3  . potassium chloride (KLOR-CON) 20 MEQ packet Take 20 mEq by mouth every other day.    . pregabalin (LYRICA) 75 MG capsule Take 150 mg by mouth at bedtime.    . rosuvastatin (CRESTOR) 20 MG tablet Take 1 tablet (20 mg total) by mouth daily. 90 tablet 3  . aspirin EC 81 MG tablet Take 81 mg by mouth daily.    . furosemide (LASIX) 80 MG tablet Take 1 tablet (80 mg total) by mouth daily. 5 tablet 0  . warfarin (COUMADIN) 2.5 MG tablet Take 1 tablet (2.5 mg total) by mouth daily. 30 tablet 5   No facility-administered medications prior to visit.     Allergies:   Heparin; Lovenox; and Pork-derived products   Social History   Social History  . Marital Status: Married    Spouse Name: N/A  . Number of Children: N/A  . Years of Education: N/A   Social History Main Topics  . Smoking status: Never Smoker   . Smokeless tobacco: Never Used  . Alcohol Use: No  . Drug Use: No  . Sexual Activity: No   Other Topics Concern  . None   Social History Narrative     Family History:  The patient's family history includes Diabetes in his father and mother; Hyperlipidemia in his mother; Hypertension  in his father and mother; Stroke in his mother; Stroke (age of onset: 57) in his father.   ROS:   Please see the history of present illness.    Difficulty sleeping because of dyspnea. Waking up at night short of breath. Lower extremity swelling. No specific complaints of chest pain. Some irregularity and heartbeat.  All other systems reviewed and are negative.   PHYSICAL EXAM:   VS:  BP 104/88 mmHg  Pulse 96  Ht 5\' 7"  (1.702 m)  Wt 231 lb (104.781 kg)  BMI 36.17 kg/m2   GEN: Well nourished, well developed, in no acute distress HEENT: normal Neck: no obvious JVD, carotid bruits, or masses. Neck veins are difficult to assess. Cardiac: IIRR; no murmurs, rubs, but trace edema  is present . An S3 gallop is audible. Respiratory:  clear to auscultation bilaterally, normal work of breathing GI: soft, nontender, nondistended, + BS. MS: no deformity or atrophy. Skin: warm and dry, no rash Neuro:  Alert and Oriented x 3, Strength and sensation are intact. Psych: euthymic mood, full affect  Wt Readings from Last 3 Encounters:  01/21/16 231 lb (104.781 kg)  01/12/16 231 lb (104.781 kg)  12/17/15 223 lb (101.152 kg)      Studies/Labs Reviewed:   EKG:  EKG  Atrial tachycardia/flutter with variable AV block with left ventricular pacing noted intermittently.  Recent Labs: 10/08/2015: Magnesium 2.0 11/05/2015: TSH 1.257 11/18/2015: ALT 17 01/12/2016: B Natriuretic Peptide 1003.5*; BUN 36*; Creatinine, Ser 1.68*; Hemoglobin 12.4*; Platelets 231; Potassium 3.8; Sodium 139   Lipid Panel    Component Value Date/Time   CHOL 105 11/04/2015 0728   TRIG 97 11/04/2015 0728   HDL 29* 11/04/2015 0728   CHOLHDL 3.6 11/04/2015 0728   VLDL 19 11/04/2015 0728   LDLCALC 57 11/04/2015 0728    Additional studies/ records that were reviewed today include:   1. Relatively recent high risk PCI Diagnostic Diagram           Post-Intervention Diagram           2. Echocardiography: August  2016 Study Conclusions  - Left ventricle: The cavity size was severely dilated. Wall  thickness was increased in a pattern of mild LVH. The estimated  ejection fraction was 20%. Diffuse hypokinesis. Doppler  parameters are consistent with elevated ventricular end-diastolic  filling pressure. - Mitral valve: There was mild regurgitation. - Left atrium: The atrium was severely dilated. - Right ventricle: The cavity size was mildly dilated. - Atrial septum: No defect or patent foramen ovale was identified.   ASSESSMENT:    1. Atrial flutter with rapid ventricular response (HCC)   2. Acute on chronic systolic heart failure (HCC)   3. Coronary artery disease involving native coronary artery of native heart without angina pectoris   4. S/P ICD (internal cardiac defibrillator) procedure      PLAN:  In order of problems listed above:  1. Atrial flutter has been present for approximately a month when first identified by Dr. Gala Romney in mid May. Amiodarone was doubled at that time. He presents today with better rate control I ever has limiting exertional dyspnea and orthopnea. He denies chest pain. His heart failure status is tenuous enough that he needs to have expedient cardioversion back to normal sinus rhythm to prevent anasarca as he had prior to the March 2017 admission. We also need to consider ablation therapy. I believe Dr. Lewayne Bunting has discussed him with Dr. Leonard Schwartz. He will need to have cardioversion with TEE clearance after a day or so of diuresis. Will start IV furosemide when he is admitted in the a.m. Will keep nothing by mouth after midnight for hopeful TEE cardioversion on Friday 01/23/16 (not yet scheduled). Continue amiodarone at 400 mg per day. We'll need to get a formal EP consult to determine if ablation is an option. 2. There is evidence of acute decompensation and chronic systolic heart failure. My presumption is that the atrial flutter is contributing to this. We'll need  to get the patient converted as soon as possible via TEE cardioversion. He has been on Coumadin and Plavix therapy since April. 3. Coronary disease appears to be stable without recurrent symptoms of angina.    Medication Adjustments/Labs and Tests Ordered: Current medicines are reviewed at  length with the patient today.  Concerns regarding medicines are outlined above.  Medication changes, Labs and Tests ordered today are listed in the Patient Instructions below. Patient Instructions  HOSPITAL WILL CALL IN THE MORNING WHEN BED IS AVAILABLE      Signed, Lesleigh Noe, MD  01/21/2016 5:57 PM    [facility] [address] [place], [place], [place]  [address] Phone: [phone]; Fax: [phone]

## 2016-01-20 NOTE — Telephone Encounter (Signed)
Left pt message to change appt 

## 2016-01-21 ENCOUNTER — Ambulatory Visit (INDEPENDENT_AMBULATORY_CARE_PROVIDER_SITE_OTHER): Payer: Medicaid Other | Admitting: Interventional Cardiology

## 2016-01-21 ENCOUNTER — Ambulatory Visit (INDEPENDENT_AMBULATORY_CARE_PROVIDER_SITE_OTHER): Payer: Medicaid Other | Admitting: *Deleted

## 2016-01-21 ENCOUNTER — Other Ambulatory Visit: Payer: Self-pay | Admitting: Interventional Cardiology

## 2016-01-21 ENCOUNTER — Encounter: Payer: Self-pay | Admitting: Interventional Cardiology

## 2016-01-21 VITALS — BP 104/88 | HR 96 | Ht 67.0 in | Wt 231.0 lb

## 2016-01-21 DIAGNOSIS — I4892 Unspecified atrial flutter: Secondary | ICD-10-CM

## 2016-01-21 DIAGNOSIS — I484 Atypical atrial flutter: Secondary | ICD-10-CM

## 2016-01-21 DIAGNOSIS — I5023 Acute on chronic systolic (congestive) heart failure: Secondary | ICD-10-CM

## 2016-01-21 DIAGNOSIS — I2510 Atherosclerotic heart disease of native coronary artery without angina pectoris: Secondary | ICD-10-CM

## 2016-01-21 DIAGNOSIS — Z95810 Presence of automatic (implantable) cardiac defibrillator: Secondary | ICD-10-CM

## 2016-01-21 LAB — POCT INR: INR: 2.2

## 2016-01-21 MED FILL — AMIODARONE HCL 200 MG TAB: 200 | 30 days supply | Qty: 60 | Fill #1

## 2016-01-21 NOTE — Patient Instructions (Addendum)
HOSPITAL WILL CALL IN THE MORNING WHEN BED IS AVAILABLE

## 2016-01-21 NOTE — H&P (Signed)
Expand All Collapse All      Cardiology Admission Note    Date: 01/21/2016   ID: Benjamin [name], DOB 25-Apr-1961, MRN [id number]  PCP: Jeanann Lewandowsky, MD Cardiologist: Lesleigh Noe, MD   Chief Complaint  Patient presents with  . Congestive Heart Failure  . Atrial Fibrillation    History of Present Illness:  Benjamin [name] is a 55 y.o. male with history of mixed ischemic and nonischemic cardiomyopathy, EF 20%, chronic severe systolic heart failure, history of atrial flutter, chronic kidney disease stage III, obesity, sleep apnea, and diabetes mellitus. He has been seen in the advanced heart failure clinic by Dr. Gala Romney. He underwent high risk PCI with LAD and right coronary rotational atherectomy and stenting in April 2017. Clinical symptomatology dramatically improved after resolution of atrial flutter and the stent procedure. Baseline weight had decreased to around 225 pounds.  When seen in the heart failure clinic on 17 May, he was noted to be in atrial flutter at that time. Amiodarone therapy was increased to 400 mg per day. Over the past month he has developed progressive dyspnea and fatigue. He has mild orthopnea.  Today he comes in for routine follow-up after the percutaneous coronary intervention and his major complaint is exertional dyspnea, fatigue, and orthopnea. He had a recent emergency room visit on 01/12/16 when he was felt to have acute on chronic heart failure. Furosemide was increased to 80 mg per day for 5 days and back to 40 mg daily. He was still in atrial flutter at that time. BNP was greater than 1000. Chest x-ray reveals CHF. He does not feel any better. He may have some component of excess oral fluid intake. In the clinic today he is still in atrial flutter with a much better controlled ventricular response at 76 bpm with an atrial flutter rate approximately 200 bpm. I discussed the patient with Dr. Gala Romney and we both agree that the patient's  rhythm needs to be converted after IV diuresis. I have recommended admission to the hospital however he states that he will prefer to go tomorrow morning.  Past Medical History  Diagnosis Date  . Hypertension   . High cholesterol   . Cholelithiasis   . Chronic systolic heart failure (HCC)   . AICD (automatic cardioverter/defibrillator) present   . Kidney stone   . Pneumonia 2002  . Type II diabetes mellitus (HCC)   . GERD (gastroesophageal reflux disease)   . Coronary artery disease     distal vessel disease    Past Surgical History  Procedure Laterality Date  . Colonoscopy N/A 07/12/2013    Procedure: COLONOSCOPY; Surgeon: Shirley Friar, MD; Location: [facility]; Service: Endoscopy; Laterality: N/A;  . Ep implantable device N/A 05/09/2015    Procedure: BiV ICD Insertion CRT-D; Surgeon: Marinus Maw, MD; Location: [facility] INVASIVE CV LAB; Service: Cardiovascular; Laterality: N/A;  . Cholecystectomy  10/09/2015    Procedure: LAPAROSCOPIC CHOLECYSTECTOMY; Surgeon: Claud Kelp, MD; Location: [facility] OR; Service: General;;  . Cardiac catheterization N/A 10/07/2015    Procedure: Left Heart Cath and Coronary Angiography; Surgeon: Lyn Records, MD; Location: [facility] INVASIVE CV LAB; Service: Cardiovascular; Laterality: N/A;  . Cardiac catheterization N/A 11/05/2015    Procedure: Coronary Stent Intervention Rotablater; Surgeon: Lyn Records, MD; Location[facility] INVASIVE CV LAB; Service: Cardiovascular; Laterality: N/A;    Current Medications: Outpatient Prescriptions Prior to Visit  Medication Sig Dispense Refill  . albuterol (PROVENTIL HFA;VENTOLIN HFA) 108 (90 Base) MCG/ACT inhaler Inhale 2 puffs  into the lungs every 6 (six) hours as needed for wheezing or shortness of breath. 1 Inhaler 0  . amiodarone (PACERONE) 200 MG tablet Take 1 tablet (200 mg total) by mouth 2 (two) times daily. 60  tablet 5  . carvedilol (COREG) 6.25 MG tablet Take 1 tablet (6.25 mg total) by mouth 2 (two) times daily with a meal. 60 tablet 3  . clopidogrel (PLAVIX) 75 MG tablet Take 1 tablet (75 mg total) by mouth daily. 90 tablet 3  . digoxin (LANOXIN) 0.125 MG tablet Take 1 tablet (0.125 mg total) by mouth daily. 30 tablet 2  . insulin aspart (NOVOLOG FLEXPEN) 100 UNIT/ML FlexPen Inject 5-10 Units into the skin 2 (two) times daily as needed for high blood sugar (CBG >140). Dose is based on size of meal    . insulin glargine (LANTUS) 100 unit/mL SOPN Inject 22-23 Units into the skin at bedtime.    Marland Kitchen lisinopril (PRINIVIL,ZESTRIL) 5 MG tablet Take 1 tablet (5 mg total) by mouth daily. 30 tablet 2  . metFORMIN (GLUCOPHAGE) 1000 MG tablet Take 1,000 mg by mouth daily with lunch.     . nitroGLYCERIN (NITROSTAT) 0.4 MG SL tablet Place 1 tablet (0.4 mg total) under the tongue every 5 (five) minutes x 3 doses as needed for chest pain. 25 tablet 3  . potassium chloride (KLOR-CON) 20 MEQ packet Take 20 mEq by mouth every other day.    . pregabalin (LYRICA) 75 MG capsule Take 150 mg by mouth at bedtime.    . rosuvastatin (CRESTOR) 20 MG tablet Take 1 tablet (20 mg total) by mouth daily. 90 tablet 3  . aspirin EC 81 MG tablet Take 81 mg by mouth daily.    . furosemide (LASIX) 80 MG tablet Take 1 tablet (80 mg total) by mouth daily. 5 tablet 0  . warfarin (COUMADIN) 2.5 MG tablet Take 1 tablet (2.5 mg total) by mouth daily. 30 tablet 5   No facility-administered medications prior to visit.     Allergies: Heparin; Lovenox; and Pork-derived products   Social History   Social History  . Marital Status: Married    Spouse Name: N/A  . Number of Children: N/A  . Years of Education: N/A   Social History Main Topics  . Smoking status: Never Smoker   . Smokeless tobacco: Never Used  . Alcohol Use: No  .  Drug Use: No  . Sexual Activity: No   Other Topics Concern  . None   Social History Narrative     Family History: The patient's family history includes Diabetes in his father and mother; Hyperlipidemia in his mother; Hypertension in his father and mother; Stroke in his mother; Stroke (age of onset: 59) in his father.   ROS:  Please see the history of present illness.  Difficulty sleeping because of dyspnea. Waking up at night short of breath. Lower extremity swelling. No specific complaints of chest pain. Some irregularity and heartbeat.  All other systems reviewed and are negative.   PHYSICAL EXAM:   VS: BP 104/88 mmHg  Pulse 96  Ht  (1.702 m)  Wt 231 lb (104.781 kg)  BMI 36.17 kg/m2  GEN: Well nourished, well developed, in no acute distress  HEENT: normal  Neck: no obvious JVD, carotid bruits, or masses. Neck veins are difficult to assess. Cardiac: IIRR; no murmurs, rubs, but trace edema is present . An S3 gallop is audible. Respiratory: clear to auscultation bilaterally, normal work of breathing GI: soft, nontender, nondistended, +  BS. MS: no deformity or atrophy.  Skin: warm and dry, no rash Neuro: Alert and Oriented x 3, Strength and sensation are intact. Psych: euthymic mood, full affect  Wt Readings from Last 3 Encounters:  01/21/16 231 lb (104.781 kg)  01/12/16 231 lb (104.781 kg)  12/17/15 223 lb (101.152 kg)      Studies/Labs Reviewed:   EKG: EKG Atrial tachycardia/flutter with variable AV block with left ventricular pacing noted intermittently.  Recent Labs: 10/08/2015: Magnesium 2.0 11/05/2015: TSH 1.257 11/18/2015: ALT 17 01/12/2016: B Natriuretic Peptide 1003.5*; BUN 36*; Creatinine, Ser 1.68*; Hemoglobin 12.4*; Platelets 231; Potassium 3.8; Sodium 139   Lipid Panel  Labs (Brief)       Component Value Date/Time   CHOL 105 11/04/2015 0728   TRIG 97 11/04/2015 0728   HDL 29* 11/04/2015 0728     CHOLHDL 3.6 11/04/2015 0728   VLDL 19 11/04/2015 0728   LDLCALC 57 11/04/2015 0728      Additional studies/ records that were reviewed today include:   1. Relatively recent high risk PCI Diagnostic Diagram           Post-Intervention Diagram           2. Echocardiography: August 2016 Study Conclusions  - Left ventricle: The cavity size was severely dilated. Wall  thickness was increased in a pattern of mild LVH. The estimated  ejection fraction was 20%. Diffuse hypokinesis. Doppler  parameters are consistent with elevated ventricular end-diastolic  filling pressure. - Mitral valve: There was mild regurgitation. - Left atrium: The atrium was severely dilated. - Right ventricle: The cavity size was mildly dilated. - Atrial septum: No defect or patent foramen ovale was identified.   ASSESSMENT:    1. Atrial flutter with rapid ventricular response (HCC)   2. Acute on chronic systolic heart failure (HCC)   3. Coronary artery disease involving native coronary artery of native heart without angina pectoris   4. S/P ICD (internal cardiac defibrillator) procedure      PLAN:  In order of problems listed above:  1. Atrial flutter has been present for approximately a month when first identified by Dr. Gala Romney in mid May. Amiodarone was doubled at that time. He presents today with better rate control I ever has limiting exertional dyspnea and orthopnea. He denies chest pain. His heart failure status is tenuous enough that he needs to have expedient cardioversion back to normal sinus rhythm to prevent anasarca as he had prior to the March 2017 admission. We also need to consider ablation therapy. I believe Dr. Lewayne Bunting has discussed him with Dr. Leonard Schwartz. He will need to have cardioversion with TEE clearance after a day or so of diuresis. Will start IV furosemide when he is admitted in the a.m. Will keep nothing by mouth after  midnight for hopeful TEE cardioversion on Friday 01/23/16 (not yet scheduled). Continue amiodarone at 400 mg per day. We'll need to get a formal EP consult to determine if ablation is an option. 2. There is evidence of acute decompensation and chronic systolic heart failure. My presumption is that the atrial flutter is contributing to this. We'll need to get the patient converted as soon as possible via TEE cardioversion. He has been on Coumadin and Plavix therapy since April. 3. Coronary disease appears to be stable without recurrent symptoms of angina.    Medication Adjustments/Labs and Tests Ordered: Current medicines are reviewed at length with the patient today. Concerns regarding medicines are outlined above. Medication changes, Labs and Tests  ordered today are listed in the Patient Instructions below. Patient Instructions  HOSPITAL WILL CALL IN THE MORNING WHEN BED IS AVAILABLE      Signed, Lesleigh Noe, MD  01/21/2016 5:57 PM  [facility] [address] [place], [place], [place] [address] Phone: [phone]; Fax: [phone]

## 2016-01-22 ENCOUNTER — Inpatient Hospital Stay (HOSPITAL_COMMUNITY)
Admission: RE | Admit: 2016-01-22 | Discharge: 2016-01-25 | DRG: 273 | Disposition: A | Discharge status: Home / Self Care | Payer: Medicaid Other | Source: Ambulatory Visit | Attending: Interventional Cardiology | Admitting: Interventional Cardiology

## 2016-01-22 ENCOUNTER — Encounter (HOSPITAL_COMMUNITY): Payer: Self-pay | Admitting: General Practice

## 2016-01-22 ENCOUNTER — Other Ambulatory Visit: Payer: Self-pay

## 2016-01-22 DIAGNOSIS — Z955 Presence of coronary angioplasty implant and graft: Secondary | ICD-10-CM

## 2016-01-22 DIAGNOSIS — Z8249 Family history of ischemic heart disease and other diseases of the circulatory system: Secondary | ICD-10-CM

## 2016-01-22 DIAGNOSIS — E1122 Type 2 diabetes mellitus with diabetic chronic kidney disease: Secondary | ICD-10-CM | POA: Diagnosis present

## 2016-01-22 DIAGNOSIS — Z823 Family history of stroke: Secondary | ICD-10-CM

## 2016-01-22 DIAGNOSIS — E669 Obesity, unspecified: Secondary | ICD-10-CM | POA: Diagnosis present

## 2016-01-22 DIAGNOSIS — I2510 Atherosclerotic heart disease of native coronary artery without angina pectoris: Secondary | ICD-10-CM | POA: Diagnosis present

## 2016-01-22 DIAGNOSIS — I5023 Acute on chronic systolic (congestive) heart failure: Secondary | ICD-10-CM | POA: Diagnosis present

## 2016-01-22 DIAGNOSIS — Z833 Family history of diabetes mellitus: Secondary | ICD-10-CM | POA: Diagnosis not present

## 2016-01-22 DIAGNOSIS — I255 Ischemic cardiomyopathy: Secondary | ICD-10-CM | POA: Diagnosis present

## 2016-01-22 DIAGNOSIS — Z7984 Long term (current) use of oral hypoglycemic drugs: Secondary | ICD-10-CM

## 2016-01-22 DIAGNOSIS — E785 Hyperlipidemia, unspecified: Secondary | ICD-10-CM | POA: Diagnosis present

## 2016-01-22 DIAGNOSIS — Z7901 Long term (current) use of anticoagulants: Secondary | ICD-10-CM

## 2016-01-22 DIAGNOSIS — I484 Atypical atrial flutter: Principal | ICD-10-CM

## 2016-01-22 DIAGNOSIS — I252 Old myocardial infarction: Secondary | ICD-10-CM

## 2016-01-22 DIAGNOSIS — I4892 Unspecified atrial flutter: Secondary | ICD-10-CM | POA: Diagnosis present

## 2016-01-22 DIAGNOSIS — N183 Chronic kidney disease, stage 3 (moderate): Secondary | ICD-10-CM | POA: Diagnosis present

## 2016-01-22 DIAGNOSIS — Z6835 Body mass index (BMI) 35.0-35.9, adult: Secondary | ICD-10-CM | POA: Diagnosis not present

## 2016-01-22 DIAGNOSIS — Z95810 Presence of automatic (implantable) cardiac defibrillator: Secondary | ICD-10-CM | POA: Diagnosis not present

## 2016-01-22 DIAGNOSIS — E7800 Pure hypercholesterolemia, unspecified: Secondary | ICD-10-CM | POA: Diagnosis present

## 2016-01-22 DIAGNOSIS — Z7902 Long term (current) use of antithrombotics/antiplatelets: Secondary | ICD-10-CM

## 2016-01-22 DIAGNOSIS — Z794 Long term (current) use of insulin: Secondary | ICD-10-CM | POA: Diagnosis not present

## 2016-01-22 DIAGNOSIS — I130 Hypertensive heart and chronic kidney disease with heart failure and stage 1 through stage 4 chronic kidney disease, or unspecified chronic kidney disease: Secondary | ICD-10-CM | POA: Diagnosis present

## 2016-01-22 DIAGNOSIS — Z7982 Long term (current) use of aspirin: Secondary | ICD-10-CM

## 2016-01-22 DIAGNOSIS — I428 Other cardiomyopathies: Secondary | ICD-10-CM | POA: Diagnosis present

## 2016-01-22 HISTORY — DX: Non-ST elevation (NSTEMI) myocardial infarction: I21.4

## 2016-01-22 LAB — CBC WITH DIFFERENTIAL/PLATELET
BASOS ABS: 0 10*3/uL (ref 0.0–0.1)
Basophils Relative: 0 %
Eosinophils Absolute: 0.2 10*3/uL (ref 0.0–0.7)
Eosinophils Relative: 3 %
HCT: 39.1 % (ref 39.0–52.0)
Hemoglobin: 11.9 g/dL — ABNORMAL LOW (ref 13.0–17.0)
LYMPHS ABS: 1.5 10*3/uL (ref 0.7–4.0)
Lymphocytes Relative: 22 %
MCH: 22.2 pg — ABNORMAL LOW (ref 26.0–34.0)
MCHC: 30.4 g/dL (ref 30.0–36.0)
MCV: 73.1 fL — AB (ref 78.0–100.0)
Monocytes Absolute: 0.7 10*3/uL (ref 0.1–1.0)
Monocytes Relative: 11 %
Neutro Abs: 4.3 10*3/uL (ref 1.7–7.7)
Neutrophils Relative %: 64 %
Platelets: 245 10*3/uL (ref 150–400)
RBC: 5.35 MIL/uL (ref 4.22–5.81)
RDW: 17 % — AB (ref 11.5–15.5)
WBC: 6.7 10*3/uL (ref 4.0–10.5)

## 2016-01-22 LAB — COMPREHENSIVE METABOLIC PANEL
ALBUMIN: 3.3 g/dL — AB (ref 3.5–5.0)
ALT: 52 U/L (ref 17–63)
AST: 29 U/L (ref 15–41)
Alkaline Phosphatase: 72 U/L (ref 38–126)
Anion gap: 7 (ref 5–15)
BUN: 30 mg/dL — AB (ref 6–20)
CHLORIDE: 107 mmol/L (ref 101–111)
CO2: 25 mmol/L (ref 22–32)
Calcium: 8.8 mg/dL — ABNORMAL LOW (ref 8.9–10.3)
Creatinine, Ser: 1.38 mg/dL — ABNORMAL HIGH (ref 0.61–1.24)
GFR calc Af Amer: >60 mL/min (ref >60)
GFR, EST NON AFRICAN AMERICAN: 57 mL/min — AB (ref >60)
Glucose, Bld: 138 mg/dL — ABNORMAL HIGH (ref 65–99)
POTASSIUM: 4.5 mmol/L (ref 3.5–5.1)
Sodium: 139 mmol/L (ref 135–145)
Total Bilirubin: 1 mg/dL (ref 0.3–1.2)
Total Protein: 6.4 g/dL — ABNORMAL LOW (ref 6.5–8.1)

## 2016-01-22 LAB — TROPONIN I
TROPONIN I: 0.06 ng/mL — AB (ref <0.031)
TROPONIN I: 0.1 ng/mL — AB (ref <0.031)
Troponin I: 0.06 ng/mL — ABNORMAL HIGH (ref <0.031)

## 2016-01-22 LAB — BRAIN NATRIURETIC PEPTIDE: B Natriuretic Peptide: 947.5 pg/mL — ABNORMAL HIGH (ref 0.0–100.0)

## 2016-01-22 LAB — GLUCOSE, CAPILLARY
GLUCOSE-CAPILLARY: 126 mg/dL — AB (ref 65–99)
GLUCOSE-CAPILLARY: 162 mg/dL — AB (ref 65–99)
Glucose-Capillary: 240 mg/dL — ABNORMAL HIGH (ref 65–99)

## 2016-01-22 LAB — PROTIME-INR
INR: 2.07 — ABNORMAL HIGH (ref 0.00–1.49)
PROTHROMBIN TIME: 23.2 s — AB (ref 11.6–15.2)

## 2016-01-22 LAB — MAGNESIUM: MAGNESIUM: 1.9 mg/dL (ref 1.7–2.4)

## 2016-01-22 LAB — TSH: TSH: 1.348 u[IU]/mL (ref 0.350–4.500)

## 2016-01-22 MED ORDER — SODIUM CHLORIDE 0.9 % IV SOLN: 250.0000 mL | INTRAVENOUS | Status: DC | PRN

## 2016-01-22 MED ORDER — PREGABALIN 75 MG PO CAPS
150.0000 mg | ORAL_CAPSULE | Freq: Every day | ORAL | Status: DC
  Administered 2016-01-22: 150 mg via ORAL
  Filled 2016-01-22 (×2): qty 2

## 2016-01-22 MED ORDER — ROSUVASTATIN CALCIUM 20 MG PO TABS
20.0000 mg | ORAL_TABLET | Freq: Every day | ORAL | Status: DC
  Administered 2016-01-22: 20 mg via ORAL
  Filled 2016-01-22: qty 1

## 2016-01-22 MED ORDER — LISINOPRIL 5 MG PO TABS
5.0000 mg | ORAL_TABLET | Freq: Every day | ORAL | Status: DC
  Administered 2016-01-22 – 2016-01-23 (×2): 5 mg via ORAL
  Filled 2016-01-22 (×2): qty 1

## 2016-01-22 MED ORDER — SODIUM CHLORIDE 0.9 % IV SOLN: INTRAVENOUS | Status: DC

## 2016-01-22 MED ORDER — AMIODARONE HCL 200 MG PO TABS
200.0000 mg | ORAL_TABLET | Freq: Two times a day (BID) | ORAL | Status: DC
  Administered 2016-01-22 – 2016-01-23 (×3): 200 mg via ORAL
  Filled 2016-01-22 (×3): qty 1

## 2016-01-22 MED ORDER — FUROSEMIDE 10 MG/ML IJ SOLN
60.0000 mg | Freq: Two times a day (BID) | INTRAMUSCULAR | Status: AC
  Administered 2016-01-22 (×2): 60 mg via INTRAVENOUS
  Filled 2016-01-22 (×2): qty 6

## 2016-01-22 MED ORDER — POTASSIUM CHLORIDE CRYS ER 20 MEQ PO TBCR
20.0000 meq | EXTENDED_RELEASE_TABLET | ORAL | Status: DC
  Administered 2016-01-22: 20 meq via ORAL
  Filled 2016-01-22: qty 1

## 2016-01-22 MED ORDER — ACETAMINOPHEN 325 MG PO TABS: 650.0000 mg | ORAL_TABLET | ORAL | Status: DC | PRN

## 2016-01-22 MED ORDER — INSULIN ASPART 100 UNIT/ML ~~LOC~~ SOLN
0.0000 [IU] | Freq: Three times a day (TID) | SUBCUTANEOUS | Status: DC
  Administered 2016-01-22: 3 [IU] via SUBCUTANEOUS

## 2016-01-22 MED ORDER — SODIUM CHLORIDE 0.9% FLUSH: 3.0000 mL | INTRAVENOUS | Status: DC | PRN

## 2016-01-22 MED ORDER — INSULIN GLARGINE 100 UNIT/ML ~~LOC~~ SOLN
10.0000 [IU] | Freq: Every day | SUBCUTANEOUS | Status: DC
  Administered 2016-01-23 – 2016-01-24 (×3): 10 [IU] via SUBCUTANEOUS
  Filled 2016-01-22 (×4): qty 0.1

## 2016-01-22 MED ORDER — WARFARIN SODIUM 2.5 MG PO TABS
1.2500 mg | ORAL_TABLET | Freq: Every day | ORAL | Status: DC
  Administered 2016-01-22: 1.25 mg via ORAL
  Filled 2016-01-22: qty 0.5

## 2016-01-22 MED ORDER — DIGOXIN 125 MCG PO TABS
0.1250 mg | ORAL_TABLET | Freq: Every day | ORAL | Status: DC
  Administered 2016-01-22 – 2016-01-23 (×2): 0.125 mg via ORAL
  Filled 2016-01-22 (×2): qty 1

## 2016-01-22 MED ORDER — CARVEDILOL 6.25 MG PO TABS
6.2500 mg | ORAL_TABLET | Freq: Two times a day (BID) | ORAL | Status: DC
  Administered 2016-01-22 – 2016-01-23 (×2): 6.25 mg via ORAL
  Filled 2016-01-22 (×2): qty 1

## 2016-01-22 MED ORDER — CLOPIDOGREL BISULFATE 75 MG PO TABS
75.0000 mg | ORAL_TABLET | Freq: Every day | ORAL | Status: DC
  Administered 2016-01-22 – 2016-01-23 (×2): 75 mg via ORAL
  Filled 2016-01-22 (×2): qty 1

## 2016-01-22 MED ORDER — INSULIN GLARGINE 100 UNIT/ML ~~LOC~~ SOLN
22.0000 [IU] | SUBCUTANEOUS | Status: DC
  Filled 2016-01-22: qty 0.22

## 2016-01-22 MED ORDER — SODIUM CHLORIDE 0.9% FLUSH
3.0000 mL | Freq: Two times a day (BID) | INTRAVENOUS | Status: DC
  Administered 2016-01-22 – 2016-01-23 (×3): 3 mL via INTRAVENOUS

## 2016-01-22 MED ORDER — ONDANSETRON HCL 4 MG/2ML IJ SOLN: 4.0000 mg | Freq: Four times a day (QID) | INTRAMUSCULAR | Status: DC | PRN

## 2016-01-22 MED ORDER — WARFARIN - PHARMACIST DOSING INPATIENT
Freq: Every day | Status: DC
  Administered 2016-01-22 – 2016-01-23 (×2)

## 2016-01-22 MED ORDER — INSULIN GLARGINE 100 UNIT/ML ~~LOC~~ SOLN
22.0000 [IU] | Freq: Every day | SUBCUTANEOUS | Status: DC
  Filled 2016-01-22: qty 0.22

## 2016-01-22 NOTE — H&P (Signed)
Cardiology Admission Note    Date: 01/21/2016   ID: Benjamin [name], DOB [birth year], MRN [id number]  PCP: Jeanann Lewandowsky, MD Cardiologist: Lesleigh Noe, MD   Chief Complaint  Patient presents with  . Congestive Heart Failure  . Atrial Fibrillation    History of Present Illness:  Benjamin [name] is a 55 y.o. male with history of mixed ischemic and nonischemic cardiomyopathy, EF 20%, chronic severe systolic heart failure, history of atrial flutter, chronic kidney disease stage III, obesity, sleep apnea, and diabetes mellitus. He has been seen in the advanced heart failure clinic by Dr. Gala Romney. He underwent high risk PCI with LAD and right coronary rotational atherectomy and stenting in April 2017. Clinical symptomatology dramatically improved after resolution of atrial flutter and the stent procedure. Baseline weight had decreased to around 225 pounds.  When seen in the heart failure clinic on 17 May, he was noted to be in atrial flutter at that time. Amiodarone therapy was increased to 400 mg per day. Over the past month he has developed progressive dyspnea and fatigue. He has mild orthopnea.  Today he comes in for routine follow-up after the percutaneous coronary intervention and his major complaint is exertional dyspnea, fatigue, and orthopnea. He had a recent emergency room visit on 01/12/16 when he was felt to have acute on chronic heart failure. Furosemide was increased to 80 mg per day for 5 days and back to 40 mg daily. He was still in atrial flutter at that time. BNP was greater than 1000. Chest x-ray reveals CHF. He does not feel any better. He may have some component of excess oral fluid intake. In the clinic today he is still in atrial flutter with a much better controlled ventricular response at 76 bpm with an atrial flutter rate approximately 200 bpm. I discussed the patient with Dr. Gala Romney and we both agree that the patient's rhythm needs to be  converted after IV diuresis. I have recommended admission to the hospital however he states that he will prefer to go tomorrow morning.  Past Medical History  Diagnosis Date  . Hypertension   . High cholesterol   . Cholelithiasis   . Chronic systolic heart failure (HCC)   . AICD (automatic cardioverter/defibrillator) present   . Kidney stone   . Pneumonia 2002  . Type II diabetes mellitus (HCC)   . GERD (gastroesophageal reflux disease)   . Coronary artery disease     distal vessel disease    Past Surgical History  Procedure Laterality Date  . Colonoscopy N/A 07/12/2013    Procedure: COLONOSCOPY; Surgeon: Shirley Friar, MD; Location: [facility]; Service: Endoscopy; Laterality: N/A;  . Ep implantable device N/A 05/09/2015    Procedure: BiV ICD Insertion CRT-D; Surgeon: Marinus Maw, MD; Location: [facility] INVASIVE CV LAB; Service: Cardiovascular; Laterality: N/A;  . Cholecystectomy  10/09/2015    Procedure: LAPAROSCOPIC CHOLECYSTECTOMY; Surgeon: Claud Kelp, MD; Location: [facility] OR; Service: General;;  . Cardiac catheterization N/A 10/07/2015    Procedure: Left Heart Cath and Coronary Angiography; Surgeon: Lyn Records, MD; Location: [facility] INVASIVE CV LAB; Service: Cardiovascular; Laterality: N/A;  . Cardiac catheterization N/A 11/05/2015    Procedure: Coronary Stent Intervention Rotablater; Surgeon: Lyn Records, MD; Location: [facility] INVASIVE CV LAB; Service: Cardiovascular; Laterality: N/A;    Current Medications: Outpatient Prescriptions Prior to Visit  Medication Sig Dispense Refill  . albuterol (PROVENTIL HFA;VENTOLIN HFA) 108 (90 Base) MCG/ACT inhaler Inhale 2 puffs into the lungs every 6 (six) hours as  needed for wheezing or shortness of breath. 1 Inhaler 0  . amiodarone (PACERONE) 200 MG  tablet Take 1 tablet (200 mg total) by mouth 2 (two) times daily. 60 tablet 5  . carvedilol (COREG) 6.25 MG tablet Take 1 tablet (6.25 mg total) by mouth 2 (two) times daily with a meal. 60 tablet 3  . clopidogrel (PLAVIX) 75 MG tablet Take 1 tablet (75 mg total) by mouth daily. 90 tablet 3  . digoxin (LANOXIN) 0.125 MG tablet Take 1 tablet (0.125 mg total) by mouth daily. 30 tablet 2  . insulin aspart (NOVOLOG FLEXPEN) 100 UNIT/ML FlexPen Inject 5-10 Units into the skin 2 (two) times daily as needed for high blood sugar (CBG >140). Dose is based on size of meal    . insulin glargine (LANTUS) 100 unit/mL SOPN Inject 22-23 Units into the skin at bedtime.    Marland Kitchen lisinopril (PRINIVIL,ZESTRIL) 5 MG tablet Take 1 tablet (5 mg total) by mouth daily. 30 tablet 2  . metFORMIN (GLUCOPHAGE) 1000 MG tablet Take 1,000 mg by mouth daily with lunch.     . nitroGLYCERIN (NITROSTAT) 0.4 MG SL tablet Place 1 tablet (0.4 mg total) under the tongue every 5 (five) minutes x 3 doses as needed for chest pain. 25 tablet 3  . potassium chloride (KLOR-CON) 20 MEQ packet Take 20 mEq by mouth every other day.    . pregabalin (LYRICA) 75 MG capsule Take 150 mg by mouth at bedtime.    . rosuvastatin (CRESTOR) 20 MG tablet Take 1 tablet (20 mg total) by mouth daily. 90 tablet 3  . aspirin EC 81 MG tablet Take 81 mg by mouth daily.    . furosemide (LASIX) 80 MG tablet Take 1 tablet (80 mg total) by mouth daily. 5 tablet 0  . warfarin (COUMADIN) 2.5 MG tablet Take 1 tablet (2.5 mg total) by mouth daily. 30 tablet 5   No facility-administered medications prior to visit.    Allergies: Heparin; Lovenox; and Pork-derived products   Social History   Social History  . Marital Status: Married    Spouse Name: N/A  . Number of Children: N/A  . Years  of Education: N/A   Social History Main Topics  . Smoking status: Never Smoker   . Smokeless tobacco: Never Used  . Alcohol Use: No  . Drug Use: No  . Sexual Activity: No   Other Topics Concern  . None   Social History Narrative    Family History: The patient's family history includes Diabetes in his father and mother; Hyperlipidemia in his mother; Hypertension in his father and mother; Stroke in his mother; Stroke (age of onset: 23) in his father.   ROS:  Please see the history of present illness.  Difficulty sleeping because of dyspnea. Waking up at night short of breath. Lower extremity swelling. No specific complaints of chest pain. Some irregularity and heartbeat.  All other systems reviewed and are negative.   PHYSICAL EXAM:   VS: BP 104/88 mmHg  Pulse 96  Ht 5\' 7"  (1.702 m)  Wt 231 lb (104.781 kg)  BMI 36.17 kg/m2  GEN: Well nourished, well developed, in no acute distress  HEENT: normal  Neck: no obvious JVD, carotid bruits, or masses. Neck veins are difficult to assess. Cardiac: IIRR; no murmurs, rubs, but trace edema is present . An S3 gallop is audible. Respiratory: clear to auscultation bilaterally, normal work of breathing GI: soft, nontender, nondistended, + BS. MS: no deformity or atrophy.  Skin: warm  and dry, no rash Neuro: Alert and Oriented x 3, Strength and sensation are intact. Psych: euthymic mood, full affect  Wt Readings from Last 3 Encounters:  01/21/16 231 lb (104.781 kg)  01/12/16 231 lb (104.781 kg)  12/17/15 223 lb (101.152 kg)     Studies/Labs Reviewed:   EKG: EKG Atrial tachycardia/flutter with variable AV block with left ventricular pacing noted intermittently.  Recent Labs: 10/08/2015: Magnesium 2.0 11/05/2015: TSH 1.257 11/18/2015: ALT 17 01/12/2016: B Natriuretic Peptide 1003.5*; BUN 36*; Creatinine, Ser 1.68*; Hemoglobin 12.4*; Platelets  231; Potassium 3.8; Sodium 139   Lipid Panel  Labs (Brief)      Component Value Date/Time   CHOL 105 11/04/2015 0728   TRIG 97 11/04/2015 0728   HDL 29* 11/04/2015 0728   CHOLHDL 3.6 11/04/2015 0728   VLDL 19 11/04/2015 0728   LDLCALC 57 11/04/2015 0728      Additional studies/ records that were reviewed today include:   1. Relatively recent high risk PCI Diagnostic Diagram           Post-Intervention Diagram           2. Echocardiography: August 2016 Study Conclusions  - Left ventricle: The cavity size was severely dilated. Wall  thickness was increased in a pattern of mild LVH. The estimated  ejection fraction was 20%. Diffuse hypokinesis. Doppler  parameters are consistent with elevated ventricular end-diastolic  filling pressure. - Mitral valve: There was mild regurgitation. - Left atrium: The atrium was severely dilated. - Right ventricle: The cavity size was mildly dilated. - Atrial septum: No defect or patent foramen ovale was identified.   ASSESSMENT:    1. Atrial flutter with rapid ventricular response (HCC)   2. Acute on chronic systolic heart failure (HCC)   3. Coronary artery disease involving native coronary artery of native heart without angina pectoris   4. S/P ICD (internal cardiac defibrillator) procedure      PLAN:  In order of problems listed above:  1. Atrial flutter has been present for approximately a month when first identified by Dr. Gala Romney in mid May. Amiodarone was doubled at that time. He presents today with better rate control I ever has limiting exertional dyspnea and orthopnea. He denies chest pain. His heart failure status is tenuous enough that he needs to have expedient cardioversion back to normal sinus rhythm to prevent anasarca as he had prior to the March 2017 admission. We also need to consider ablation  therapy. I believe Dr. Lewayne Bunting has discussed him with Dr. Leonard Schwartz. He will need to have cardioversion with TEE clearance after a day or so of diuresis. Will start IV furosemide when he is admitted in the a.m. Will keep nothing by mouth after midnight for hopeful TEE cardioversion on Friday 01/23/16 (not yet scheduled). Continue amiodarone at 400 mg per day. We'll need to get a formal EP consult to determine if ablation is an option. 2. There is evidence of acute decompensation and chronic systolic heart failure. My presumption is that the atrial flutter is contributing to this. We'll need to get the patient converted as soon as possible via TEE cardioversion. He has been on Coumadin and Plavix therapy since April. 3. Coronary disease appears to be stable without recurrent symptoms of angina.    Medication Adjustments/Labs and Tests Ordered: Current medicines are reviewed at length with the patient today. Concerns regarding medicines are outlined above. Medication changes, Labs and Tests ordered today are listed in the Patient Instructions below. Patient Instructions  HOSPITAL WILL CALL IN THE MORNING WHEN BED IS AVAILABLE     Signed, Lesleigh Noe, MD  01/21/2016 5:57 PM  [facility] [address] [place], [place], [place] [address] Phone: [phone]; Fax: [phone]            The patient has been seen today after determining at OV yesterday that he needed admission to the hospital.. All aspects of care have been considered and discussed with the patient.The patient has been personally interviewed, examined, and all clinical data has been reviewed.   Needs cardioversion tomorrow after diuresis. Conversion will be via TEE Cardioversion or catheter based ablation.  EP consult  HF service to follow.

## 2016-01-22 NOTE — H&P (Signed)
Advanced Heart Failure Team History and Physical Note   Primary Physician: Primary Cardiologist:  Dr Katrinka Blazing HF MD: Bensimhon  Reason for Admission: HF and  A FIb    HPI:   Benjamin [name] is 55 year old male with history of nonischemic cardiomyopathy hypertension, diabetes mellitus, hyperlipidemia, chronic systolic heart failure, S/P St Jude bi-V ICD implanted October 2016. His last echocardiogram was August 2016 ejection fraction was 20% with diffuse hypokinesis. Mild LVH. Mild Benjamin. Left atrium severely dilated. Also has CAD underwent 2 vessel PCI , April 2017.   Admitted in early March with abdominal pain and increased SOB with exertion. Cardiology consulted for surgical clearance. Due to many risk factors he was sent for [facility]. LHC showed complex, calcified, multivessel coronary disease with high-grade obstruction in the mid LAD and distal RCA. He had laparoscopic cholecystectomy. Plan for 2V PCI by Dr Katrinka Blazing after he recovered from cholecystectomy  Admitted in April with AFL. Converted with amio. And then underwent 2-V PCI with rotoblator under Impella support.  Earlier this week he was seen by Dr Katrinka Blazing and was back in A flutter and mild overload. He was set up for admit to TEE DC-CV. SOB with exertion. + Orthopnea + PND.  Weight 220 to 231 pounds Limited activity at home. No bleeding problems.  Taking all medications.   LHC 10/07/2015 - Dr Katrinka Blazing  Prox RCA to Mid RCA lesion, 20% stenosed.  Dist RCA lesion, 90% stenosed.  RPDA lesion, 90% stenosed.  1st Diag lesion, 100% stenosed.  Mid LAD lesion, 95% stenosed.  Dist LAD lesion, 90% stenosed.  Ost Cx lesion, 60% stenosed.  2nd Mrg lesion, 95% stenosed.  Prox Cx to Mid Cx lesion, 60% stenosed.  LVEF 20-25%. Review of Systems: [y] = yes, [ ]  = no   General: Weight gain [Y ]; Weight loss [ ] ; Anorexia [ ] ; Fatigue [Y ]; Fever [ ] ; Chills [ ] ; Weakness [Y ]  Cardiac: Chest pain/pressure [ ] ; Resting SOB [ ] ; Exertional SOB [ Y];  Orthopnea [Y ]; Pedal Edema [Y ]; Palpitations [ ] ; Syncope [ ] ; Presyncope [ ] ; Paroxysmal nocturnal dyspnea[Y ]  Pulmonary: Cough [ ] ; Wheezing[ ] ; Hemoptysis[ ] ; Sputum [ ] ; Snoring [ ]   GI: Vomiting[ ] ; Dysphagia[ ] ; Melena[ ] ; Hematochezia [ ] ; Heartburn[ ] ; Abdominal pain [ ] ; Constipation [ ] ; Diarrhea [ ] ; BRBPR [ ]   GU: Hematuria[ ] ; Dysuria [ ] ; Nocturia[ ]   Vascular: Pain in legs with walking [ ] ; Pain in feet with lying flat [ ] ; Non-healing sores [ ] ; Stroke [ ] ; TIA [ ] ; Slurred speech [ ] ;  Neuro: Headaches[ ] ; Vertigo[ ] ; Seizures[ ] ; Paresthesias[ ] ;Blurred vision [ ] ; Diplopia [ ] ; Vision changes [ ]   Ortho/Skin: Arthritis [ ] ; Joint pain [ ] ; Muscle pain [ ] ; Joint swelling [ ] ; Back Pain [ ] ; Rash [ ]   Psych: Depression[ ] ; Anxiety[ ]   Heme: Bleeding problems [ ] ; Clotting disorders [ ] ; Anemia [ ]   Endocrine: Diabetes [ Y]; Thyroid dysfunction[ ]   Home Medications Prior to Admission medications   Medication Sig Start Date End Date Taking? Authorizing Provider  albuterol (PROVENTIL HFA;VENTOLIN HFA) 108 (90 Base) MCG/ACT inhaler Inhale 2 puffs into the lungs every 6 (six) hours as needed for wheezing or shortness of breath. 12/22/15   Jaclyn Shaggy, MD  amiodarone (PACERONE) 200 MG tablet Take 1 tablet (200 mg total) by mouth 2 (two) times daily. 12/17/15   Dolores Patty, MD  carvedilol (COREG) 6.25  MG tablet Take 1 tablet (6.25 mg total) by mouth 2 (two) times daily with a meal. 12/02/15   Jaclyn Shaggy, MD  clopidogrel (PLAVIX) 75 MG tablet Take 1 tablet (75 mg total) by mouth daily. 11/09/15   Azalee Course, PA  digoxin (LANOXIN) 0.125 MG tablet Take 1 tablet (0.125 mg total) by mouth daily. 11/17/15   Jaclyn Shaggy, MD  furosemide (LASIX) 20 MG tablet Take 40 mg by mouth daily. 12/02/15   Historical Provider, MD  insulin aspart (NOVOLOG FLEXPEN) 100 UNIT/ML FlexPen Inject 5-10 Units into the skin 2 (two) times daily as needed for high blood sugar (CBG >140). Dose is based on size of  meal    Historical Provider, MD  insulin glargine (LANTUS) 100 unit/mL SOPN Inject 22-23 Units into the skin at bedtime.    Historical Provider, MD  lisinopril (PRINIVIL,ZESTRIL) 5 MG tablet Take 1 tablet (5 mg total) by mouth daily. 01/07/16   Jaclyn Shaggy, MD  metFORMIN (GLUCOPHAGE) 1000 MG tablet Take 1,000 mg by mouth daily with lunch.     Historical Provider, MD  nitroGLYCERIN (NITROSTAT) 0.4 MG SL tablet Place 1 tablet (0.4 mg total) under the tongue every 5 (five) minutes x 3 doses as needed for chest pain. 11/09/15   Azalee Course, PA  potassium chloride (KLOR-CON) 20 MEQ packet Take 20 mEq by mouth every other day.    Historical Provider, MD  pregabalin (LYRICA) 75 MG capsule Take 150 mg by mouth at bedtime.    Historical Provider, MD  rosuvastatin (CRESTOR) 20 MG tablet Take 1 tablet (20 mg total) by mouth daily. 08/22/15   Quentin Angst, MD  traMADol (ULTRAM) 50 MG tablet Take 50 mg by mouth every 6 (six) hours as needed. (pain) 11/18/15   Historical Provider, MD  warfarin (COUMADIN) 2.5 MG tablet Take 1.25 mg by mouth daily. 12/17/15   Historical Provider, MD    Past Medical History: Past Medical History  Diagnosis Date  . Hypertension   . High cholesterol   . Cholelithiasis   . Chronic systolic heart failure (HCC)   . AICD (automatic cardioverter/defibrillator) present   . Kidney stone   . Pneumonia 2002  . Type II diabetes mellitus (HCC)   . GERD (gastroesophageal reflux disease)   . Coronary artery disease     distal vessel disease  . NSTEMI (non-ST elevated myocardial infarction) [facility])     Past Surgical History: Past Surgical History  Procedure Laterality Date  . Colonoscopy N/A 07/12/2013    Procedure: COLONOSCOPY;  Surgeon: Shirley Friar, MD;  Location: [facility];  Service: Endoscopy;  Laterality: N/A;  . Ep implantable device N/A 05/09/2015    Procedure: BiV ICD Insertion CRT-D;  Surgeon: Marinus Maw, MD;  Location: [facility] INVASIVE CV LAB;  Service:  Cardiovascular;  Laterality: N/A;  . Cholecystectomy  10/09/2015    Procedure: LAPAROSCOPIC CHOLECYSTECTOMY;  Surgeon: Claud Kelp, MD;  Location: [facility] OR;  Service: General;;  . Cardiac catheterization N/A 10/07/2015    Procedure: Left Heart Cath and Coronary Angiography;  Surgeon: Lyn Records, MD;  Location: [facility] INVASIVE CV LAB;  Service: Cardiovascular;  Laterality: N/A;  . Cardiac catheterization N/A 11/05/2015    Procedure: Coronary Stent Intervention Rotablater;  Surgeon: Lyn Records, MD;  Location: [facility] INVASIVE CV LAB;  Service: Cardiovascular;  Laterality: N/A;    Family History: Family History  Problem Relation Age of Onset  . Diabetes Mother   . Hypertension Mother   . Hyperlipidemia Mother   .  Stroke Mother   . Diabetes Father   . Hypertension Father   . Stroke Father 12    Social History: Social History   Social History  . Marital Status: Married    Spouse Name: N/A  . Number of Children: N/A  . Years of Education: N/A   Social History Main Topics  . Smoking status: Never Smoker   . Smokeless tobacco: Never Used  . Alcohol Use: No  . Drug Use: No  . Sexual Activity: No   Other Topics Concern  . None   Social History Narrative    Allergies:  Allergies  Allergen Reactions  . Heparin Other (See Comments)    Will not use pork-derived products due to religious beliefs   . Lovenox [Enoxaparin Sodium] Other (See Comments)    Will not use pork-derived products due to religious beliefs   . Pork-Derived Products Other (See Comments)    Does not eat pork or pork derived products d/t religious reasons    Objective:    Vital Signs:   Temp:  [98 F (36.7 C)] 98 F (36.7 C) (06/22 1027) Pulse Rate:  [92] 92 (06/22 1027) Resp:  [18] 18 (06/22 1027) BP: (116)/(79) 116/79 mmHg (06/22 1027) SpO2:  [96 %] 96 % (06/22 1027) Weight:  [227 lb 14.4 oz (103.375 kg)] 227 lb 14.4 oz (103.375 kg) (06/22 1027)   Filed Weights   01/22/16 1027  Weight: 227 lb 14.4 oz  (103.375 kg)    Physical Exam: General:  Appears fatigued. No resp difficulty HEENT: normal Neck: supple. JVP ~10 . Carotids 2+ bilat; no bruits. No lymphadenopathy or thryomegaly appreciated. Cor: PMI nondisplaced. Regular rate & rhythm. No rubs, gallops or murmurs. Lungs: clear Abdomen: obese, soft, nontender, nondistended. No hepatosplenomegaly. No bruits or masses. Good bowel sounds. Extremities: no cyanosis, clubbing, rash, R and LLE 1+ edema Neuro: alert & orientedx3, cranial nerves grossly intact. moves all 4 extremities w/o difficulty. Affect pleasant  Telemetry: A flutter 70s  Labs: Basic Metabolic Panel:  Recent Labs Lab 01/22/16 1128  NA 139  K 4.5  CL 107  CO2 25  GLUCOSE 138*  BUN 30*  CREATININE 1.38*  CALCIUM 8.8*  MG 1.9    Liver Function Tests:  Recent Labs Lab 01/22/16 1128  AST 29  ALT 52  ALKPHOS 72  BILITOT 1.0  PROT 6.4*  ALBUMIN 3.3*   No results for input(s): LIPASE, AMYLASE in the last 168 hours. No results for input(s): AMMONIA in the last 168 hours.  CBC:  Recent Labs Lab 01/22/16 1128  WBC 6.7  NEUTROABS 4.3  HGB 11.9*  HCT 39.1  MCV 73.1*  PLT 245    Cardiac Enzymes:  Recent Labs Lab 01/22/16 1128  TROPONINI 0.06*    BNP: BNP (last 3 results)  Recent Labs  11/03/15 2021 01/12/16 0400 01/22/16 1128  BNP 575.9* 1003.5* 947.5*    ProBNP (last 3 results) No results for input(s): PROBNP in the last 8760 hours.   CBG:  Recent Labs Lab 01/22/16 1209  GLUCAP 126*    Coagulation Studies:  Recent Labs  01/21/16 1423 01/22/16 1128  LABPROT  --  23.2*  INR 2.2 2.07*    Other results: EKG: A flutter   Imaging:  No results found.      Assessment/Plan/Discussion.   Benjamin Benjamin [name] is a 55 year old with a/C heart failure and recurrent a flutter admitted for IV diuresis and TEE/DC-CV.   1. Acute/ Chronic Systolic Heart Failure-Mixed ICM/NICM. ECHO  2016 with EF 20%. Has St Jude ICD.  - NYHA IIIb.   Volume status elevated. Started on IV lasix today. - Continue carvedilol 6.25 mg twice a day. Continue dig 0.125 mg daily.  - Continue lisinopril 5 daily. - He has had hyperkalemia in the past with spiroand refused re challenge.  2. A flutter Plan for TEE DC-CV tomorrow at 2:00. NPO after midnight. Continue amio 200 mg twice a day + coumadin. INR therapeutic since 12/26/15. Pharmacy to dose coumadin. Consult EP for possible ablation.  3. CAD- Multivessel CAD- LHC 10/2015 with Complex, calcified, multivessel coronary disease with high-grade obstruction in the mid LAD and distal RCA.  - s/p 2v PC in 4/17 - On plavix and warfarin, crestor, bb.  4. HTN- stable- BP actually low at times 5. Hyperlipidemia- Continue crestor daily  Length of Stay: 0  Amy Clegg NP-C  01/22/2016, 1:06 PM  Advanced Heart Failure Team Pager [phone] (M-F; 7a - 4p)  Please contact [facility] for night-coverage after hours (4p -7a ) and weekends on [email or web address]  Patient seen with NP, agree with the above note.   1. Acute on chronic systolic CHF: Ischemic cardiomyopathy.  EF 20% in 2016.  On exam today, he is volume overloaded.  Suspect this was triggered by going back into atrial flutter, at least in part. - Lasix 60 mg IV bid.  - Continue current Coreg, digoxin, and lisinopril. Check digoxin level. - Has had hyperkalemia with spironolactone.  2. CAD: s/p PCI in 4/17.  On Plavix, warfarin, Crestor. No chest pain.  3. Atrial flutter: ?Typical versus atypical.  Discussed with EP.  Will plan TEE followed by atrial flutter ablation tomorrow if typical (will put up catheters to decide).  If atypical, may just get DCCV.  NPO at midnight.    Marca Ancona 01/22/2016 2:11 PM

## 2016-01-22 NOTE — Progress Notes (Signed)
ANTICOAGULATION CONSULT NOTE - Initial Consult  Pharmacy Consult for warfarin Indication: atrial fibrillation  Allergies  Allergen Reactions  . Heparin Other (See Comments)    Will not use pork-derived products due to religious beliefs   . Lovenox [Enoxaparin Sodium] Other (See Comments)    Will not use pork-derived products due to religious beliefs   . Pork-Derived Products Other (See Comments)    Does not eat pork or pork derived products d/t religious reasons    Patient Measurements: Height: 5\' 7"  (170.2 cm) Weight: 227 lb 14.4 oz (103.375 kg) IBW/kg (Calculated) : 66.1  Vital Signs: Temp: 98 F (36.7 C) (06/22 1027) Temp Source: Oral (06/22 1027) BP: 116/79 mmHg (06/22 1027) Pulse Rate: 92 (06/22 1027)  Labs:  Recent Labs  01/21/16 1423  INR 2.2    Estimated Creatinine Clearance: 57.6 mL/min (by C-G formula based on Cr of 1.68).  Assessment: 55 yo m presenting with CHF, Afib - potential cardioversion 6/23  PMH: NICM EF 20%, HF, Aflutter on warfarin, CKD3, OSA, DM  AC: warfarin PTA. Admit INR 2.2 (from clinic 6/21)  PTA warfarin 1.25 mg daily  Goal of Therapy:  INR 2-3 Monitor platelets by anticoagulation protocol: Yes   Plan:  Warfarin 1.25 mg daily INR MWF CBC q72h Monitor s/sx of bleeding  Isaac Bliss, PharmD, BCPS, [facility] Clinical Pharmacist Pager [phone] 01/22/2016 10:43 AM

## 2016-01-22 NOTE — Consult Note (Signed)
ELECTROPHYSIOLOGY CONSULT NOTE    Patient ID: Benjamin [name] MRN: [id number], DOB/AGE: 1961/01/16 55 y.o.  Admit date: 01/22/2016 Date of Consult: 01/22/2016  Primary Physician: Jeanann Lewandowsky, MD Primary Cardiologist: Katrinka Blazing Requesting Physician: Smith/McLean  Reason for Consultation: atrial flutter  HPI:  Benjamin [name] is a 55 y.o. male with a past medical history significant for hypertension, hyperlipidemia, mixed cardiomyopathy, chronic systolic heart failure and STJ CRTD.  He developed atrial flutter in April of this year and was placed on amiodarone. He also underwent PCI at that time. He has had progressive shortness of breath with exertion and orthopnea since that time and was seen by Dr Katrinka Blazing in the office.  Admission for diuresis and rhythm control was recommended and the patient was admitted earlier today. He has been seen by the AHF team who is helping to manage acute on chronic systolic heart failure. EP has been asked to evaluate for treatment options for atrial flutter.  Echo 03/2015 demonstrated EF 20%, mild LVH, diffuse hypokinesis, LA 68 (pre CRTD implant).   He currently states that he is short of breath with exertion, +orthopnea, +abdomina and LE edema.  He has not had chest pain, dizziness, syncope, recent fevers or chills.   Past Medical History  Diagnosis Date  . Hypertension   . High cholesterol   . Cholelithiasis   . Chronic systolic heart failure (HCC)   . AICD (automatic cardioverter/defibrillator) present   . Kidney stone   . Pneumonia 2002  . Type II diabetes mellitus (HCC)   . GERD (gastroesophageal reflux disease)   . Coronary artery disease     distal vessel disease  . NSTEMI (non-ST elevated myocardial infarction) [facility])      Surgical History:  Past Surgical History  Procedure Laterality Date  . Colonoscopy N/A 07/12/2013    Procedure: COLONOSCOPY;  Surgeon: Shirley Friar, MD;  Location: [facility] ENDOSCOPY;  Service: Endoscopy;  Laterality: N/A;    . Ep implantable device N/A 05/09/2015    Procedure: BiV ICD Insertion CRT-D;  Surgeon: Marinus Maw, MD;  Location: [facility] INVASIVE CV LAB;  Service: Cardiovascular;  Laterality: N/A;  . Cholecystectomy  10/09/2015    Procedure: LAPAROSCOPIC CHOLECYSTECTOMY;  Surgeon: Claud Kelp, MD;  Location: [facility] OR;  Service: General;;  . Cardiac catheterization N/A 10/07/2015    Procedure: Left Heart Cath and Coronary Angiography;  Surgeon: Lyn Records, MD;  Location: [facility] INVASIVE CV LAB;  Service: Cardiovascular;  Laterality: N/A;  . Cardiac catheterization N/A 11/05/2015    Procedure: Coronary Stent Intervention Rotablater;  Surgeon: Lyn Records, MD;  Location: [facility] INVASIVE CV LAB;  Service: Cardiovascular;  Laterality: N/A;     Prescriptions prior to admission  Medication Sig Dispense Refill Last Dose  . albuterol (PROVENTIL HFA;VENTOLIN HFA) 108 (90 Base) MCG/ACT inhaler Inhale 2 puffs into the lungs every 6 (six) hours as needed for wheezing or shortness of breath. 1 Inhaler 0 Taking  . amiodarone (PACERONE) 200 MG tablet Take 1 tablet (200 mg total) by mouth 2 (two) times daily. 60 tablet 5 Taking  . carvedilol (COREG) 6.25 MG tablet Take 1 tablet (6.25 mg total) by mouth 2 (two) times daily with a meal. 60 tablet 3 Taking  . clopidogrel (PLAVIX) 75 MG tablet Take 1 tablet (75 mg total) by mouth daily. 90 tablet 3 Taking  . digoxin (LANOXIN) 0.125 MG tablet Take 1 tablet (0.125 mg total) by mouth daily. 30 tablet 2 Taking  . furosemide (LASIX) 20 MG tablet  Take 40 mg by mouth daily.  3 Taking  . insulin aspart (NOVOLOG FLEXPEN) 100 UNIT/ML FlexPen Inject 5-10 Units into the skin 2 (two) times daily as needed for high blood sugar (CBG >140). Dose is based on size of meal   Taking  . insulin glargine (LANTUS) 100 unit/mL SOPN Inject 22-23 Units into the skin at bedtime.   Taking  . lisinopril (PRINIVIL,ZESTRIL) 5 MG tablet Take 1 tablet (5 mg total) by mouth daily. 30 tablet 2 Taking  . metFORMIN  (GLUCOPHAGE) 1000 MG tablet Take 1,000 mg by mouth daily with lunch.    Taking  . nitroGLYCERIN (NITROSTAT) 0.4 MG SL tablet Place 1 tablet (0.4 mg total) under the tongue every 5 (five) minutes x 3 doses as needed for chest pain. 25 tablet 3 Taking  . potassium chloride (KLOR-CON) 20 MEQ packet Take 20 mEq by mouth every other day.   Taking  . pregabalin (LYRICA) 75 MG capsule Take 150 mg by mouth at bedtime.   Taking  . rosuvastatin (CRESTOR) 20 MG tablet Take 1 tablet (20 mg total) by mouth daily. 90 tablet 3 Taking  . traMADol (ULTRAM) 50 MG tablet Take 50 mg by mouth every 6 (six) hours as needed. (pain)  0 Taking  . warfarin (COUMADIN) 2.5 MG tablet Take 1.25 mg by mouth daily.  5 Taking    Inpatient Medications:  . amiodarone  200 mg Oral BID  . carvedilol  6.25 mg Oral BID WC  . clopidogrel  75 mg Oral Daily  . digoxin  0.125 mg Oral Daily  . furosemide  60 mg Intravenous BID  . insulin aspart  0-9 Units Subcutaneous TID WC  . insulin glargine  22 Units Subcutaneous QHS  . lisinopril  5 mg Oral Daily  . potassium chloride  20 mEq Oral QODAY  . pregabalin  150 mg Oral Daily  . rosuvastatin  20 mg Oral q1800  . sodium chloride flush  3 mL Intravenous Q12H  . warfarin  1.25 mg Oral q1800  . Warfarin - Pharmacist Dosing Inpatient   Does not apply q1800    Allergies:  Allergies  Allergen Reactions  . Heparin Other (See Comments)    Will not use pork-derived products due to religious beliefs   . Lovenox [Enoxaparin Sodium] Other (See Comments)    Will not use pork-derived products due to religious beliefs   . Pork-Derived Products Other (See Comments)    Does not eat pork or pork derived products d/t religious reasons    Social History   Social History  . Marital Status: Married    Spouse Name: N/A  . Number of Children: N/A  . Years of Education: N/A   Occupational History  . Not on file.   Social History Main Topics  . Smoking status: Never Smoker   . Smokeless  tobacco: Never Used  . Alcohol Use: No  . Drug Use: No  . Sexual Activity: No   Other Topics Concern  . Not on file   Social History Narrative     Family History  Problem Relation Age of Onset  . Diabetes Mother   . Hypertension Mother   . Hyperlipidemia Mother   . Stroke Mother   . Diabetes Father   . Hypertension Father   . Stroke Father 59     Review of Systems: All other systems reviewed and are otherwise negative except as noted above.  Physical Exam: Filed Vitals:   01/22/16 1027  BP: 116/79  Pulse: 92  Temp: 98 F (36.7 C)  TempSrc: Oral  Resp: 18  Height: 5\' 7"  (1.702 m)  Weight: 227 lb 14.4 oz (103.375 kg)  SpO2: 96%    GEN- The patient is obese appearing, alert and oriented x 3 today.   HEENT: normocephalic, atraumatic; sclera clear, conjunctiva pink; hearing intact; oropharynx clear; neck supple  Lungs- Clear to ausculation bilaterally, normal work of breathing.  No wheezes, rales, rhonchi Heart- Regular rate and rhythm (paced) GI- soft, non-tender, non-distended, bowel sounds present, no hepatosplenomegaly Extremities- no clubbing, cyanosis, 1+ BLE edema  MS- no significant deformity or atrophy Skin- warm and dry, no rash or lesion Psych- euthymic mood, full affect Neuro- strength and sensation are intact  Labs:   Lab Results  Component Value Date   WBC 6.7 01/22/2016   HGB 11.9* 01/22/2016   HCT 39.1 01/22/2016   MCV 73.1* 01/22/2016   PLT 245 01/22/2016    Recent Labs Lab 01/22/16 1128  NA 139  K 4.5  CL 107  CO2 25  BUN 30*  CREATININE 1.38*  CALCIUM 8.8*  PROT 6.4*  BILITOT 1.0  ALKPHOS 72  ALT 52  AST 29  GLUCOSE 138*      Radiology/Studies: Dg Chest 2 View 01/12/2016  CLINICAL DATA:  Acute onset of shortness of breath and weight gain. Lower extremity swelling. Initial encounter. EXAM: CHEST  2 VIEW COMPARISON:  Chest radiograph performed 11/15/2015 FINDINGS: The lungs are well-aerated. Vascular congestion is noted. Mildly  increased interstitial markings could reflect minimal interstitial edema. There is no evidence of pleural effusion or pneumothorax. The heart is enlarged. A pacemaker/AICD is noted at the left chest wall, with leads ending at the right atrium, right ventricle and coronary sinus. No acute osseous abnormalities are seen. Clips are noted within the right upper quadrant, reflecting prior cholecystectomy. IMPRESSION: Vascular congestion and cardiomegaly. Mildly increased interstitial markings could reflect minimal interstitial edema. Electronically Signed   By: Roanna Raider M.D.   On: 01/12/2016 03:56    EKG: likely typical atrial flutter with ventricular pacing  TELEMETRY: atrial flutter with ventricular pacing   DEVICE HISTORY: STJ CRTD implanted 05/2015 by Dr Ladona Ridgel  Assessment/Plan: 1.  Atrial flutter Likely typical by EKG; however, LA is significantly dilated making risk of recurrent atrial arrhythmias higher.  Discussed with patient today. He would like to pursue flutter ablation. Would likely recommend continued amiodarone at least for the short term to prevent development of atrial fibrillation.  TEE planned for tomorrow. Will plan flutter ablation with Dr Ladona Ridgel to follow. Risks, benefits reviewed with patient who wishes to proceed. If not typical flutter on EPS, will plan DCCV.  Weight loss will be important for long term maintenance of SR Would also recommend outpatient sleep study Continue Warfarin for CHADS2VASC of 4   2. Acute on chronic systolic heart failure Management per AHF team  3.  CAD No recent ischemic symptoms Continue medical therapy   Dr Johney Frame to see later today    Signed, Gypsy Balsam, NP 01/22/2016 2:42 PM  I have seen, examined the patient, and reviewed the above assessment and plan. On exam, iRRR.  Changes to above are made where necessary.  Pt with likely isthmus dependant atrial flutter.  No afib seen.  Though atrial flutter is ablatable, he is at high risk  of atrial fibrillation in the future given atriopathy/ advanced structural heart disease.  Therapeutic strategies for atrial flutter including medicine and ablation were discussed in detail with the patient  today. Risk, benefits, and alternatives to EP study and radiofrequency ablation were also discussed in detail today. These risks include but are not limited to stroke, bleeding, vascular damage, tamponade, perforation, damage to the heart and other structures, AV block requiring pacemaker, worsening renal function, and death. The patient understands these risk and wishes to proceed.  We will therefore proceed with catheter ablation at the next available time.  I have tentatively placed on the schedule for Dr Ladona Ridgel for tomorrow.   Co Sign: Hillis Range, MD 01/22/2016 9:47 PM

## 2016-01-23 ENCOUNTER — Inpatient Hospital Stay (HOSPITAL_COMMUNITY): Payer: Medicaid Other

## 2016-01-23 ENCOUNTER — Ambulatory Visit (HOSPITAL_COMMUNITY): Admission: RE | Admit: 2016-01-23 | Payer: MEDICAID | Source: Ambulatory Visit | Admitting: Cardiovascular Disease

## 2016-01-23 ENCOUNTER — Encounter (HOSPITAL_COMMUNITY)
Admission: RE | Disposition: A | Discharge status: Home / Self Care | Payer: Self-pay | Source: Ambulatory Visit | Attending: Interventional Cardiology

## 2016-01-23 ENCOUNTER — Encounter: Payer: Self-pay | Admitting: Internal Medicine

## 2016-01-23 DIAGNOSIS — I4892 Unspecified atrial flutter: Secondary | ICD-10-CM

## 2016-01-23 DIAGNOSIS — I340 Nonrheumatic mitral (valve) insufficiency: Secondary | ICD-10-CM

## 2016-01-23 HISTORY — PX: ELECTROPHYSIOLOGIC STUDY: SHX172A

## 2016-01-23 LAB — GLUCOSE, CAPILLARY
GLUCOSE-CAPILLARY: 117 mg/dL — AB (ref 65–99)
GLUCOSE-CAPILLARY: 181 mg/dL — AB (ref 65–99)
Glucose-Capillary: 119 mg/dL — ABNORMAL HIGH (ref 65–99)
Glucose-Capillary: 119 mg/dL — ABNORMAL HIGH (ref 65–99)

## 2016-01-23 LAB — BASIC METABOLIC PANEL WITH GFR
Anion gap: 8 (ref 5–15)
BUN: 30 mg/dL — ABNORMAL HIGH (ref 6–20)
CO2: 25 mmol/L (ref 22–32)
Calcium: 8.7 mg/dL — ABNORMAL LOW (ref 8.9–10.3)
Chloride: 106 mmol/L (ref 101–111)
Creatinine, Ser: 1.37 mg/dL — ABNORMAL HIGH (ref 0.61–1.24)
GFR calc Af Amer: >60 mL/min
GFR calc non Af Amer: 57 mL/min — ABNORMAL LOW
Glucose, Bld: 123 mg/dL — ABNORMAL HIGH (ref 65–99)
Potassium: 4 mmol/L (ref 3.5–5.1)
Sodium: 139 mmol/L (ref 135–145)

## 2016-01-23 LAB — PROTIME-INR
INR: 2.01 — ABNORMAL HIGH (ref 0.00–1.49)
Prothrombin Time: 22.7 s — ABNORMAL HIGH (ref 11.6–15.2)

## 2016-01-23 LAB — HEMOGLOBIN A1C
Hgb A1c MFr Bld: 7.1 % — ABNORMAL HIGH (ref 4.8–5.6)
MEAN PLASMA GLUCOSE: 157 mg/dL

## 2016-01-23 SURGERY — A-FLUTTER/A-TACH/SVT ABLATION
Anesthesia: LOCAL

## 2016-01-23 SURGERY — ECHOCARDIOGRAM, TRANSESOPHAGEAL
Anesthesia: Moderate Sedation

## 2016-01-23 MED ORDER — SODIUM CHLORIDE 0.9 % IV SOLN: 250.0000 mL | INTRAVENOUS | Status: DC | PRN

## 2016-01-23 MED ORDER — ALUM & MAG HYDROXIDE-SIMETH 200-200-20 MG/5ML PO SUSP
30.0000 mL | Freq: Four times a day (QID) | ORAL | Status: DC | PRN
  Administered 2016-01-23: 30 mL via ORAL
  Filled 2016-01-23: qty 30

## 2016-01-23 MED ORDER — ACETAMINOPHEN 325 MG PO TABS: 650.0000 mg | ORAL_TABLET | ORAL | Status: DC | PRN

## 2016-01-23 MED ORDER — WARFARIN SODIUM 2.5 MG PO TABS
1.2500 mg | ORAL_TABLET | Freq: Every day | ORAL | Status: AC
  Administered 2016-01-23: 1.25 mg via ORAL
  Filled 2016-01-23: qty 0.5

## 2016-01-23 MED ORDER — FUROSEMIDE 40 MG PO TABS
40.0000 mg | ORAL_TABLET | Freq: Every day | ORAL | Status: DC
  Administered 2016-01-23 – 2016-01-24 (×2): 40 mg via ORAL
  Filled 2016-01-23 (×2): qty 1

## 2016-01-23 MED ORDER — WARFARIN SODIUM 2.5 MG PO TABS
1.2500 mg | ORAL_TABLET | Freq: Once | ORAL | Status: DC
  Filled 2016-01-23: qty 1

## 2016-01-23 MED ORDER — INSULIN ASPART 100 UNIT/ML ~~LOC~~ SOLN: 5.0000 [IU] | Freq: Two times a day (BID) | SUBCUTANEOUS | Status: DC | PRN

## 2016-01-23 MED ORDER — ONDANSETRON HCL 4 MG/2ML IJ SOLN: 4.0000 mg | Freq: Four times a day (QID) | INTRAMUSCULAR | Status: DC | PRN

## 2016-01-23 MED ORDER — HEPARIN (PORCINE) IN NACL 2-0.9 UNIT/ML-% IJ SOLN
INTRAMUSCULAR | Status: AC
Start: 2016-01-23 — End: 2016-01-23
  Filled 2016-01-23: qty 500

## 2016-01-23 MED ORDER — AMIODARONE HCL 200 MG PO TABS
200.0000 mg | ORAL_TABLET | Freq: Two times a day (BID) | ORAL | Status: DC
  Administered 2016-01-23 – 2016-01-25 (×4): 200 mg via ORAL
  Filled 2016-01-23 (×4): qty 1

## 2016-01-23 MED ORDER — INSULIN GLARGINE 100 UNITS/ML SOLOSTAR PEN: 15.0000 [IU] | PEN_INJECTOR | Freq: Every day | SUBCUTANEOUS | Status: DC

## 2016-01-23 MED ORDER — MIDAZOLAM HCL 5 MG/5ML IJ SOLN
INTRAMUSCULAR | Status: AC
  Filled 2016-01-23: qty 5

## 2016-01-23 MED ORDER — WARFARIN SODIUM 2.5 MG PO TABS: 1.2500 mg | ORAL_TABLET | Freq: Every day | ORAL | Status: DC

## 2016-01-23 MED ORDER — NITROGLYCERIN 0.4 MG SL SUBL: 0.4000 mg | SUBLINGUAL_TABLET | SUBLINGUAL | Status: DC | PRN

## 2016-01-23 MED ORDER — ROSUVASTATIN CALCIUM 20 MG PO TABS
20.0000 mg | ORAL_TABLET | Freq: Every day | ORAL | Status: DC
  Administered 2016-01-23 – 2016-01-24 (×2): 20 mg via ORAL
  Filled 2016-01-23 (×2): qty 1

## 2016-01-23 MED ORDER — FENTANYL CITRATE (PF) 100 MCG/2ML IJ SOLN
INTRAMUSCULAR | Status: DC | PRN
  Administered 2016-01-23 (×2): 12.5 ug via INTRAVENOUS
  Administered 2016-01-23: 25 ug via INTRAVENOUS
  Administered 2016-01-23 (×2): 12.5 ug via INTRAVENOUS

## 2016-01-23 MED ORDER — OXYCODONE-ACETAMINOPHEN 5-325 MG PO TABS
1.0000 | ORAL_TABLET | Freq: Four times a day (QID) | ORAL | Status: DC | PRN
  Administered 2016-01-23 – 2016-01-24 (×3): 1 via ORAL
  Filled 2016-01-23 (×3): qty 1

## 2016-01-23 MED ORDER — POTASSIUM CHLORIDE 20 MEQ PO PACK
20.0000 meq | PACK | ORAL | Status: DC
  Administered 2016-01-23 – 2016-01-25 (×2): 20 meq via ORAL
  Filled 2016-01-23 (×2): qty 1

## 2016-01-23 MED ORDER — MIDAZOLAM HCL 5 MG/5ML IJ SOLN
INTRAMUSCULAR | Status: DC | PRN
  Administered 2016-01-23 (×2): 2 mg via INTRAVENOUS
  Administered 2016-01-23 (×4): 1 mg via INTRAVENOUS

## 2016-01-23 MED ORDER — PREGABALIN 75 MG PO CAPS
150.0000 mg | ORAL_CAPSULE | Freq: Every day | ORAL | Status: DC
Start: 2016-01-23 — End: 2016-01-25
  Administered 2016-01-23 – 2016-01-24 (×2): 150 mg via ORAL
  Filled 2016-01-23 (×2): qty 2

## 2016-01-23 MED ORDER — BUPIVACAINE HCL (PF) 0.25 % IJ SOLN
INTRAMUSCULAR | Status: DC | PRN
  Administered 2016-01-23: 30 mL

## 2016-01-23 MED ORDER — DIGOXIN 125 MCG PO TABS
0.1250 mg | ORAL_TABLET | Freq: Every day | ORAL | Status: DC
  Administered 2016-01-24 – 2016-01-25 (×2): 0.125 mg via ORAL
  Filled 2016-01-23 (×2): qty 1

## 2016-01-23 MED ORDER — SODIUM CHLORIDE 0.9% FLUSH: 3.0000 mL | INTRAVENOUS | Status: DC | PRN

## 2016-01-23 MED ORDER — CLOPIDOGREL BISULFATE 75 MG PO TABS
75.0000 mg | ORAL_TABLET | Freq: Every day | ORAL | Status: DC
  Administered 2016-01-24 – 2016-01-25 (×2): 75 mg via ORAL
  Filled 2016-01-23 (×2): qty 1

## 2016-01-23 MED ORDER — FENTANYL CITRATE (PF) 100 MCG/2ML IJ SOLN
INTRAMUSCULAR | Status: AC
  Filled 2016-01-23: qty 2

## 2016-01-23 MED ORDER — BUPIVACAINE HCL (PF) 0.25 % IJ SOLN
INTRAMUSCULAR | Status: AC
  Filled 2016-01-23: qty 60

## 2016-01-23 MED ORDER — METFORMIN HCL 500 MG PO TABS
1000.0000 mg | ORAL_TABLET | Freq: Every day | ORAL | Status: DC
Start: 2016-01-24 — End: 2016-01-25
  Administered 2016-01-24: 1000 mg via ORAL
  Filled 2016-01-23 (×2): qty 2

## 2016-01-23 MED ORDER — AMIODARONE HCL 200 MG PO TABS: 200.0000 mg | ORAL_TABLET | Freq: Two times a day (BID) | ORAL | Status: DC

## 2016-01-23 MED ORDER — SODIUM CHLORIDE 0.9 % IV SOLN
INTRAVENOUS | Status: DC | PRN
  Administered 2016-01-23: 10 mL/h via INTRAVENOUS

## 2016-01-23 MED ORDER — ALBUTEROL SULFATE (2.5 MG/3ML) 0.083% IN NEBU
2.5000 mg | INHALATION_SOLUTION | Freq: Four times a day (QID) | RESPIRATORY_TRACT | Status: DC | PRN
  Administered 2016-01-24: 2.5 mg via RESPIRATORY_TRACT
  Filled 2016-01-23: qty 3

## 2016-01-23 MED ORDER — LISINOPRIL 5 MG PO TABS
5.0000 mg | ORAL_TABLET | Freq: Every day | ORAL | Status: DC
Start: 2016-01-23 — End: 2016-01-25
  Administered 2016-01-24 – 2016-01-25 (×2): 5 mg via ORAL
  Filled 2016-01-23 (×2): qty 1

## 2016-01-23 MED ORDER — CARVEDILOL 6.25 MG PO TABS
6.2500 mg | ORAL_TABLET | Freq: Two times a day (BID) | ORAL | Status: DC
  Administered 2016-01-23 – 2016-01-25 (×4): 6.25 mg via ORAL
  Filled 2016-01-23 (×4): qty 1

## 2016-01-23 MED ORDER — SODIUM CHLORIDE 0.9% FLUSH
3.0000 mL | Freq: Two times a day (BID) | INTRAVENOUS | Status: DC
  Administered 2016-01-23 – 2016-01-25 (×4): 3 mL via INTRAVENOUS

## 2016-01-23 SURGICAL SUPPLY — 10 items
BAG SNAP BAND KOVER 36X36 (MISCELLANEOUS) ×1 IMPLANT
CATH BLAZERPRIME XP (ABLATOR) ×1 IMPLANT
CATH JOSEPHSON QUAD-ALLRED 6FR (CATHETERS) ×1 IMPLANT
CATH WEB BIDIR CS D-F NONAUTO (CATHETERS) ×1 IMPLANT
PACK EP LATEX FREE (CUSTOM PROCEDURE TRAY) ×2
PACK EP LF (CUSTOM PROCEDURE TRAY) IMPLANT
PAD DEFIB LIFELINK (PAD) ×1 IMPLANT
SHEATH PINNACLE 6F 10CM (SHEATH) ×1 IMPLANT
SHEATH PINNACLE 8F 10CM (SHEATH) ×2 IMPLANT
SHIELD RADPAD SCOOP 12X17 (MISCELLANEOUS) ×1 IMPLANT

## 2016-01-23 NOTE — Hospital Discharge Follow-Up (Signed)
Transitional Care Clinic at [place]:  Patient known to the [facility] [place]. Met with patient at bedside to determine plans for primary care follow-up after discharge. Patient agreeable to primary care follow-up, case management services with the [facility] [place] [facility] again. Patient indicates [phone] (mobile) and [phone] are best contact numbers for him. He is aware he will receive a follow-up phone call from [place] after discharge. Transitional Care appointment scheduled for 01/27/16 at 1430 with Dr. Jarold Song. Appointment on AVS. Olga Coaster, RN CM updated. Will continue to follow patient's clinical progress closely.

## 2016-01-23 NOTE — Progress Notes (Signed)
ANTICOAGULATION CONSULT NOTE - Follow Up Consult  Pharmacy Consult for Coumadin Indication: aflutter  Allergies  Allergen Reactions  . Heparin Other (See Comments)    Will not use pork-derived products due to religious beliefs   . Lovenox [Enoxaparin Sodium] Other (See Comments)    Will not use pork-derived products due to religious beliefs   . Pork-Derived Products Other (See Comments)    Does not eat pork or pork derived products d/t religious reasons    Patient Measurements: Height: 5\' 7"  (170.2 cm) Weight: 224 lb 12.8 oz (101.969 kg) IBW/kg (Calculated) : 66.1  Vital Signs: Temp: 97.7 F (36.5 C) (06/23 0833) Temp Source: Oral (06/23 0833) BP: 102/84 mmHg (06/23 0833) Pulse Rate: 72 (06/23 0833)  Labs:  Recent Labs  01/21/16 1423 01/22/16 1128 01/22/16 1613 01/22/16 2232 01/23/16 0704  HGB  --  11.9*  --   --   --   HCT  --  39.1  --   --   --   PLT  --  245  --   --   --   LABPROT  --  23.2*  --   --  22.7*  INR 2.2 2.07*  --   --  2.01*  CREATININE  --  1.38*  --   --  1.37*  TROPONINI  --  0.06* 0.10* 0.06*  --     Estimated Creatinine Clearance: 70.2 mL/min (by C-G formula based on Cr of 1.37).  Assessment: 55yom on coumadin pta for aflutter, admitted with volume overload 2/2 recurrent aflutter. EP consulted and he will need DCCV vs ablation. INR is therapeutic at 2.01.  Home dose: 1.25mg  daily  Goal of Therapy:  INR 2-3 Monitor platelets by anticoagulation protocol: Yes   Plan:  1) Coumadin 1.25mg  x 1 2) Daily INR  Fredrik Rigger 01/23/2016,2:01 PM

## 2016-01-23 NOTE — Progress Notes (Signed)
  Echocardiogram Echocardiogram Transesophageal has been performed.  Benjamin [name] 01/23/2016, 12:38 PM

## 2016-01-23 NOTE — Progress Notes (Signed)
Pt c/o indigestion, able to swallow food, but it is not going down from earlier,  gave Maaolx from previous order, pt was also on Nexium to help with GERD 4 mo ago, may need again, pt wanted a xray in am for this as well, on call MD aware, will continue to monitor, Thanks Lavonda Jumbo RN

## 2016-01-23 NOTE — Progress Notes (Signed)
Preliminary TEE report Indication: R/O LAA thrombus prior to atrial flutter ablation Pt sedated with versed 4 mg and fentanyl 25 micrograms IV. Pharynx anesthetized with oral cetacaine spray. Omniplane probe passed without difficulty  Severe global reduction in LV function (EF 10-15) LVE Moderate LAE; spontaneous contrast in LAA but no obvious thrombus. Mild RAE and RVE; severely reduced RV function. Pacer wire noted in RV. Small pericardial effusion Moderate atherosclerosis descending aorta Trileaflet aortic valve with trace AI Mildly thickened MV with mild MR Mild TR Trace PI No IAFC by color  Full report to follow  Olga Millers

## 2016-01-23 NOTE — Progress Notes (Signed)
Advanced Heart Failure Rounding Note   Subjective:   Admitted with volume overload and A flutter. Diuresing with IV lasix. EP consulted.   Denies SOB.    Objective:   Weight Range:  Vital Signs:   Temp:  [97.7 F (36.5 C)-98.6 F (37 C)] 97.7 F (36.5 C) (06/23 0833) Pulse Rate:  [72-92] 72 (06/23 0833) Resp:  [18] 18 (06/23 0833) BP: (102-120)/(76-90) 102/84 mmHg (06/23 0833) SpO2:  [96 %-98 %] 98 % (06/23 0833) Weight:  [224 lb 12.8 oz (101.969 kg)-227 lb 14.4 oz (103.375 kg)] 224 lb 12.8 oz (101.969 kg) (06/23 0600) Last BM Date: 01/21/16  Weight change: Filed Weights   01/22/16 1027 01/23/16 0600  Weight: 227 lb 14.4 oz (103.375 kg) 224 lb 12.8 oz (101.969 kg)    Intake/Output:   Intake/Output Summary (Last 24 hours) at 01/23/16 0925 Last data filed at 01/23/16 3704  Gross per 24 hour  Intake    960 ml  Output   3675 ml  Net  -2715 ml     Physical Exam: General:  Well appearing. No resp difficulty. In bed  HEENT: normal Neck: supple. JVP 9-10. Carotids 2+ bilat; no bruits. No lymphadenopathy or thryomegaly appreciated. Cor: PMI nondisplaced. Regular rate & rhythm. No rubs, gallops or murmurs. Lungs: clear Abdomen: soft, nontender, nondistended. No hepatosplenomegaly. No bruits or masses. Good bowel sounds. Extremities: no cyanosis, clubbing, rash, R and LLE trace edema. edema Neuro: alert & orientedx3, cranial nerves grossly intact. moves all 4 extremities w/o difficulty. Affect pleasant  Telemetry: A flutter 70  Labs: Basic Metabolic Panel:  Recent Labs Lab 01/22/16 1128 01/23/16 0704  NA 139 139  K 4.5 4.0  CL 107 106  CO2 25 25  GLUCOSE 138* 123*  BUN 30* 30*  CREATININE 1.38* 1.37*  CALCIUM 8.8* 8.7*  MG 1.9  --     Liver Function Tests:  Recent Labs Lab 01/22/16 1128  AST 29  ALT 52  ALKPHOS 72  BILITOT 1.0  PROT 6.4*  ALBUMIN 3.3*   No results for input(s): LIPASE, AMYLASE in the last 168 hours. No results for  input(s): AMMONIA in the last 168 hours.  CBC:  Recent Labs Lab 01/22/16 1128  WBC 6.7  NEUTROABS 4.3  HGB 11.9*  HCT 39.1  MCV 73.1*  PLT 245    Cardiac Enzymes:  Recent Labs Lab 01/22/16 1128 01/22/16 1613 01/22/16 2232  TROPONINI 0.06* 0.10* 0.06*    BNP: BNP (last 3 results)  Recent Labs  11/03/15 2021 01/12/16 0400 01/22/16 1128  BNP 575.9* 1003.5* 947.5*    ProBNP (last 3 results) No results for input(s): PROBNP in the last 8760 hours.    Other results:  Imaging:  No results found.   Medications:     Scheduled Medications: . amiodarone  200 mg Oral BID  . carvedilol  6.25 mg Oral BID WC  . clopidogrel  75 mg Oral Daily  . digoxin  0.125 mg Oral Daily  . insulin aspart  0-9 Units Subcutaneous TID WC  . insulin glargine  10 Units Subcutaneous QHS  . lisinopril  5 mg Oral Daily  . potassium chloride  20 mEq Oral QODAY  . pregabalin  150 mg Oral Daily  . rosuvastatin  20 mg Oral q1800  . sodium chloride flush  3 mL Intravenous Q12H  . warfarin  1.25 mg Oral q1800  . Warfarin - Pharmacist Dosing Inpatient   Does not apply q1800     Infusions: .  sodium chloride Stopped (01/22/16 1030)     PRN Medications:  sodium chloride, acetaminophen, alum & mag hydroxide-simeth, ondansetron (ZOFRAN) IV, sodium chloride flush   Assessment/Plan/Discussion  Benjamin [name] is a 55 year old with a/C heart failure and recurrent a flutter admitted for IV diuresis and TEE/DC-CV.   1. Acute/ Chronic Systolic Heart Failure-Mixed ICM/NICM. ECHO 2016 with EF 20%. Has [employer] Jude ICD.  - NYHA IIIb. Volume status improving. Continue IV lasix. Renal function stable.  - Continue carvedilol 6.25 mg twice a day. Continue dig 0.125 mg daily.  - Continue lisinopril 5 daily. - He has had hyperkalemia in the past with spiroand refused re challenge.  2. A flutter Plan for TEE DC-CV today.NPO. Continue amio 200 mg twice a day + coumadin. INR therapeutic since 12/26/15.  Pharmacy to dose coumadin. INR 2.01 Plan for TEE DC-CV today. May have ablation if Typical. EP following.   3. CAD- Multivessel CAD- LHC 10/2015 with Complex, calcified, multivessel coronary disease with high-grade obstruction in the mid LAD and distal RCA.  - s/p 2v PC in 4/17 - On plavix and warfarin, crestor, bb.  4. HTN- stable- BP actually low at times 5. Hyperlipidemia- Continue crestor daily  Amy Clegg NP-C  9:28 AM  Length of Stay: 1  Amy Clegg 01/23/2016, 9:25 AM  Advanced Heart Failure Team Pager [phone] (M-F; 7a - 4p)  Please contact [facility] for night-coverage after hours (4p -7a ) and weekends on [email or web address]  Patient seen with NP, agree with the above note.  1. Acute on chronic systolic CHF: Ischemic cardiomyopathy. EF 20% in 2016. He diuresed well yesterday, weight down 3 lbs.  Still some volume overload on exam. Suspect exacerbation was triggered by going back into atrial flutter, at least in part. - Continue Lasix 60 mg IV bid today.  - Continue current Coreg, digoxin, and lisinopril. Check digoxin level in am. - Has had hyperkalemia with spironolactone, does not want to re-try.  2. CAD: s/p PCI in 4/17. On Plavix, warfarin, Crestor. No chest pain.  3. Atrial flutter: ?Typical versus atypical. Discussed with EP. To have TEE followed by atrial flutter ablation today if typical atrial flutter (will put up catheters to decide). If atypical, may just get DCCV. Continue amiodarone and coumadin, INR therapeutic.   Marca Ancona 01/23/2016

## 2016-01-23 NOTE — Progress Notes (Signed)
SUBJECTIVE: The patient is doing well today.  At this time, he denies chest pain, shortness of breath, or any new concerns. Shortness of breath improved  CURRENT MEDICATIONS: . amiodarone  200 mg Oral BID  . carvedilol  6.25 mg Oral BID WC  . clopidogrel  75 mg Oral Daily  . digoxin  0.125 mg Oral Daily  . insulin aspart  0-9 Units Subcutaneous TID WC  . insulin glargine  10 Units Subcutaneous QHS  . lisinopril  5 mg Oral Daily  . potassium chloride  20 mEq Oral QODAY  . pregabalin  150 mg Oral Daily  . rosuvastatin  20 mg Oral q1800  . sodium chloride flush  3 mL Intravenous Q12H  . warfarin  1.25 mg Oral q1800  . Warfarin - Pharmacist Dosing Inpatient   Does not apply q1800   . sodium chloride Stopped (01/22/16 1030)    OBJECTIVE: Physical Exam: Filed Vitals:   01/22/16 2000 01/23/16 0039 01/23/16 0600 01/23/16 0833  BP: 107/83 120/76 111/90 102/84  Pulse: 75 72 72 72  Temp: 98.2 F (36.8 C) 98.6 F (37 C) 98.3 F (36.8 C) 97.7 F (36.5 C)  TempSrc: Oral Oral Oral Oral  Resp: Height:      Weight:   224 lb 12.8 oz (101.969 kg)   SpO2: 98% 97% 98% 98%    Intake/Output Summary (Last 24 hours) at 01/23/16 1027 Last data filed at 01/23/16 0959  Gross per 24 hour  Intake   1080 ml  Output   3675 ml  Net  -2595 ml    Telemetry reveals atrial flutter  GEN- The patient is well appearing, alert and oriented x 3 today.   Head- normocephalic, atraumatic Eyes-  Sclera clear, conjunctiva pink Ears- hearing intact Oropharynx- clear Neck- supple  Lungs- Clear to ausculation bilaterally, normal work of breathing Heart- Regular rate and rhythm, no murmurs, rubs or gallops  GI- soft, NT, ND, + BS Extremities- no clubbing, cyanosis, or edema Skin- no rash or lesion Psych- euthymic mood, full affect Neuro- strength and sensation are intact  LABS: Basic Metabolic Panel:  Recent Labs  96/04/54 1128 01/23/16 0704  NA 139 139  K 4.5 4.0  CL 107 106    CO2 25 25  GLUCOSE 138* 123*  BUN 30* 30*  CREATININE 1.38* 1.37*  CALCIUM 8.8* 8.7*  MG 1.9  --    Liver Function Tests:  Recent Labs  01/22/16 1128  AST 29  ALT 52  ALKPHOS 72  BILITOT 1.0  PROT 6.4*  ALBUMIN 3.3*   CBC:  Recent Labs  01/22/16 1128  WBC 6.7  NEUTROABS 4.3  HGB 11.9*  HCT 39.1  MCV 73.1*  PLT 245   Cardiac Enzymes:  Recent Labs  01/22/16 1128 01/22/16 1613 01/22/16 2232  TROPONINI 0.06* 0.10* 0.06*   Hemoglobin A1C:  Recent Labs  01/22/16 1128  HGBA1C 7.1*   Thyroid Function Tests:  Recent Labs  01/22/16 1613  TSH 1.348    RADIOLOGY: Dg Chest 2 View 01/12/2016  CLINICAL DATA:  Acute onset of shortness of breath and weight gain. Lower extremity swelling. Initial encounter. EXAM: CHEST  2 VIEW COMPARISON:  Chest radiograph performed 11/15/2015 FINDINGS: The lungs are well-aerated. Vascular congestion is noted. Mildly increased interstitial markings could reflect minimal interstitial edema. There is no evidence of pleural effusion or pneumothorax. The heart is enlarged. A pacemaker/AICD is noted at the left chest wall, with leads ending at  the right atrium, right ventricle and coronary sinus. No acute osseous abnormalities are seen. Clips are noted within the right upper quadrant, reflecting prior cholecystectomy. IMPRESSION: Vascular congestion and cardiomegaly. Mildly increased interstitial markings could reflect minimal interstitial edema. Electronically Signed   By: Roanna Raider M.D.   On: 01/12/2016 03:56    ASSESSMENT AND PLAN:  Active Problems:   Acute on chronic systolic HF (heart failure) (HCC)   Atypical atrial flutter (HCC)  1. Atrial flutter Likely typical by EKG; however, LA is significantly dilated making risk of recurrent atrial arrhythmias higher. Plan TEE/flutter ablation today. Risks, benefits reviewed with patient who wishes to proceed. If not typical flutter on EPS, will plan DCCV.  Weight loss will be  important for long term maintenance of SR Would also recommend outpatient sleep study Continue Warfarin for CHADS2VASC of 4   2. Acute on chronic systolic heart failure Management per AHF team  3. CAD No recent ischemic symptoms Continue medical therapy   Gypsy Balsam, NP 01/23/2016 10:29 AM  EP Attending  Patient seen and examined. Agree with above. He is stable for his procedure. Will undergo TEE and catheter ablation of atrial flutter. If flutter in the LA, will need DCCV  Leonia Reeves.D.

## 2016-01-23 NOTE — Progress Notes (Addendum)
Site area: 3-rt fv sheaths Site Prior to Removal:  Level 0 Pressure Applied For:  10 minutes Manual:   yes Patient Status During Pull:  stable Post Pull Site:  Level  0 Post Pull Instructions Given:  yes Post Pull Pulses Present: yes Dressing Applied:  tegaderm Bedrest begins @  1445 Comments:  IV saline locked

## 2016-01-24 ENCOUNTER — Other Ambulatory Visit: Payer: Self-pay

## 2016-01-24 DIAGNOSIS — I5023 Acute on chronic systolic (congestive) heart failure: Secondary | ICD-10-CM

## 2016-01-24 LAB — BASIC METABOLIC PANEL
ANION GAP: 10 (ref 5–15)
BUN: 32 mg/dL — ABNORMAL HIGH (ref 6–20)
CALCIUM: 8.8 mg/dL — AB (ref 8.9–10.3)
CHLORIDE: 102 mmol/L (ref 101–111)
CO2: 24 mmol/L (ref 22–32)
CREATININE: 1.35 mg/dL — AB (ref 0.61–1.24)
GFR calc non Af Amer: 58 mL/min — ABNORMAL LOW (ref >60)
Glucose, Bld: 182 mg/dL — ABNORMAL HIGH (ref 65–99)
Potassium: 4.5 mmol/L (ref 3.5–5.1)
SODIUM: 136 mmol/L (ref 135–145)

## 2016-01-24 LAB — GLUCOSE, CAPILLARY
GLUCOSE-CAPILLARY: 227 mg/dL — AB (ref 65–99)
GLUCOSE-CAPILLARY: 214 mg/dL — AB (ref 65–99)
GLUCOSE-CAPILLARY: 128 mg/dL — AB (ref 65–99)
Glucose-Capillary: 161 mg/dL — ABNORMAL HIGH (ref 65–99)

## 2016-01-24 LAB — DIGOXIN LEVEL: Digoxin Level: 1.3 ng/mL (ref 0.8–2.0)

## 2016-01-24 MED ORDER — INSULIN ASPART 100 UNIT/ML ~~LOC~~ SOLN
0.0000 [IU] | Freq: Three times a day (TID) | SUBCUTANEOUS | Status: DC
  Administered 2016-01-24 (×2): 5 [IU] via SUBCUTANEOUS

## 2016-01-24 MED ORDER — WARFARIN SODIUM 2.5 MG PO TABS
1.2500 mg | ORAL_TABLET | Freq: Once | ORAL | Status: AC
  Administered 2016-01-24: 1.25 mg via ORAL
  Filled 2016-01-24: qty 0.5

## 2016-01-24 MED ORDER — FUROSEMIDE 10 MG/ML IJ SOLN
80.0000 mg | Freq: Once | INTRAMUSCULAR | Status: AC
  Administered 2016-01-24: 80 mg via INTRAVENOUS
  Filled 2016-01-24: qty 8

## 2016-01-24 NOTE — Progress Notes (Signed)
Patient ID: Benjamin [name], male   DOB: Dec 12, 1960, 55 y.o.   MRN: [id number]    Patient Name: Benjamin [name] Date of Encounter: 01/24/2016     Active Problems:   Acute on chronic systolic HF (heart failure) (HCC)   Atypical atrial flutter (HCC)    SUBJECTIVE  No chest pain, dyspnea is better, many questions  CURRENT MEDS . amiodarone  200 mg Oral BID  . carvedilol  6.25 mg Oral BID WC  . clopidogrel  75 mg Oral Daily  . digoxin  0.125 mg Oral Daily  . furosemide  40 mg Oral Daily  . insulin glargine  10 Units Subcutaneous QHS  . lisinopril  5 mg Oral Daily  . metFORMIN  1,000 mg Oral Q lunch  . potassium chloride  20 mEq Oral QODAY  . pregabalin  150 mg Oral QHS  . rosuvastatin  20 mg Oral q1800  . sodium chloride flush  3 mL Intravenous Q12H  . Warfarin - Pharmacist Dosing Inpatient   Does not apply q1800    OBJECTIVE  Filed Vitals:   01/23/16 2039 01/24/16 0040 01/24/16 0649 01/24/16 1108  BP: 102/65 116/80 104/82   Pulse: 60 59 60 62  Temp: 97.8 F (36.6 C) 97.8 F (36.6 C) 97.8 F (36.6 C)   TempSrc: Oral Oral Oral   Resp: 17 16 18    Height:      Weight:   226 lb 6.4 oz (102.694 kg)   SpO2: 97% 98% 98%     Intake/Output Summary (Last 24 hours) at 01/24/16 1109 Last data filed at 01/24/16 1041  Gross per 24 hour  Intake    480 ml  Output   1650 ml  Net  -1170 ml   Filed Weights   01/22/16 1027 01/23/16 0600 01/24/16 0649  Weight: 227 lb 14.4 oz (103.375 kg) 224 lb 12.8 oz (101.969 kg) 226 lb 6.4 oz (102.694 kg)    PHYSICAL EXAM  General: Pleasant, NAD. Neuro: Alert and oriented X 3. Moves all extremities spontaneously. Psych: Normal affect. HEENT:  Normal  Neck: Supple without bruits or JVD. Lungs:  Resp regular and unlabored, CTA. Heart: RRR no s3, s4, or murmurs. Abdomen: Soft, non-tender, non-distended, BS + x 4.  Extremities: No clubbing, 2+ peripheral edema. DP/PT/Radials 2+ and equal bilaterally.  Accessory Clinical Findings  CBC  Recent  Labs  01/22/16 1128  WBC 6.7  NEUTROABS 4.3  HGB 11.9*  HCT 39.1  MCV 73.1*  PLT 245   Basic Metabolic Panel  Recent Labs  01/22/16 1128 01/23/16 0704  NA 139 139  K 4.5 4.0  CL 107 106  CO2 25 25  GLUCOSE 138* 123*  BUN 30* 30*  CREATININE 1.38* 1.37*  CALCIUM 8.8* 8.7*  MG 1.9  --    Liver Function Tests  Recent Labs  01/22/16 1128  AST 29  ALT 52  ALKPHOS 72  BILITOT 1.0  PROT 6.4*  ALBUMIN 3.3*   No results for input(s): LIPASE, AMYLASE in the last 72 hours. Cardiac Enzymes  Recent Labs  01/22/16 1128 01/22/16 1613 01/22/16 2232  TROPONINI 0.06* 0.10* 0.06*   BNP Invalid input(s): POCBNP D-Dimer No results for input(s): DDIMER in the last 72 hours. Hemoglobin A1C  Recent Labs  01/22/16 1128  HGBA1C 7.1*   Fasting Lipid Panel No results for input(s): CHOL, HDL, LDLCALC, TRIG, CHOLHDL, LDLDIRECT in the last 72 hours. Thyroid Function Tests  Recent Labs  01/22/16 1613  TSH 1.348  TELE  nsr with BiV pacing  Radiology/Studies  Dg Chest 2 View  01/12/2016  CLINICAL DATA:  Acute onset of shortness of breath and weight gain. Lower extremity swelling. Initial encounter. EXAM: CHEST  2 VIEW COMPARISON:  Chest radiograph performed 11/15/2015 FINDINGS: The lungs are well-aerated. Vascular congestion is noted. Mildly increased interstitial markings could reflect minimal interstitial edema. There is no evidence of pleural effusion or pneumothorax. The heart is enlarged. A pacemaker/AICD is noted at the left chest wall, with leads ending at the right atrium, right ventricle and coronary sinus. No acute osseous abnormalities are seen. Clips are noted within the right upper quadrant, reflecting prior cholecystectomy. IMPRESSION: Vascular congestion and cardiomegaly. Mildly increased interstitial markings could reflect minimal interstitial edema. Electronically Signed   By: Roanna Raider M.D.   On: 01/12/2016 03:56    ASSESSMENT AND PLAN  1. Acute  on chronic systolic heart failure - he feels a bit better today now that he is in NSR. Will continue IV diuresis, and hopefully discharge home tomorrow. 2. Atrial flutter - he is s/p ablation and DCCV and is maintaining NSR. He will continue amiodarone 3. Atrial fib - he is in NSR. Continue anti-coagulation. 4. DM - Hgb A1C as above. Will follow.   Gregg Taylor,M.D.  01/24/2016 11:09 AM

## 2016-01-24 NOTE — Progress Notes (Signed)
ANTICOAGULATION CONSULT NOTE - Follow Up Consult  Pharmacy Consult for Coumadin Indication: aflutter  Allergies  Allergen Reactions  . Heparin Other (See Comments)    Will not use pork-derived products due to religious beliefs   . Lovenox [Enoxaparin Sodium] Other (See Comments)    Will not use pork-derived products due to religious beliefs   . Pork-Derived Products Other (See Comments)    Does not eat pork or pork derived products d/t religious reasons    Patient Measurements: Height: 5\' 7"  (170.2 cm) Weight: 226 lb 6.4 oz (102.694 kg) (a scale) IBW/kg (Calculated) : 66.1  Vital Signs: Temp: 100.5 F (38.1 C) (06/24 1134) Temp Source: Oral (06/24 1134) BP: 103/61 mmHg (06/24 1134) Pulse Rate: 60 (06/24 1134)  Labs:  Recent Labs  01/21/16 1423 01/22/16 1128 01/22/16 1613 01/22/16 2232 01/23/16 0704 01/24/16 1150  HGB  --  11.9*  --   --   --   --   HCT  --  39.1  --   --   --   --   PLT  --  245  --   --   --   --   LABPROT  --  23.2*  --   --  22.7*  --   INR 2.2 2.07*  --   --  2.01*  --   CREATININE  --  1.38*  --   --  1.37* 1.35*  TROPONINI  --  0.06* 0.10* 0.06*  --   --     Estimated Creatinine Clearance: 71.4 mL/min (by C-G formula based on Cr of 1.35).  Assessment: 55yom on coumadin pta for aflutter, admitted with volume overload 2/2 recurrent aflutter. EP consulted and he will need DCCV vs ablation. INR is therapeutic at 2.01 on 6/23.  CBC stable on 6/22  Home dose: 1.25mg  daily  Goal of Therapy:  INR 2-3 Monitor platelets by anticoagulation protocol: Yes   Plan:  1) Coumadin 1.25mg  x 1 2) INR qMWF  Benjamin [name], PharmD Pharmacy Resident  Pager: [phone] 01/24/2016 2:06 PM

## 2016-01-24 NOTE — Progress Notes (Signed)
Refused bed alarm. Will continue to monitor patient. 

## 2016-01-25 LAB — GLUCOSE, CAPILLARY: GLUCOSE-CAPILLARY: 101 mg/dL — AB (ref 65–99)

## 2016-01-25 MED ORDER — WARFARIN SODIUM 2.5 MG PO TABS: 1.2500 mg | ORAL_TABLET | Freq: Every day | ORAL | Status: DC

## 2016-01-25 MED ORDER — FUROSEMIDE 10 MG/ML IJ SOLN
80.0000 mg | Freq: Once | INTRAMUSCULAR | Status: AC
  Administered 2016-01-25: 80 mg via INTRAVENOUS
  Filled 2016-01-25: qty 8

## 2016-01-25 NOTE — Discharge Summary (Signed)
Discharge Summary    Patient ID: Benjamin [name],  MRN: [id number], DOB/AGE: 1960/08/10 55 y.o.  Admit date: 01/22/2016 Discharge date: 01/25/2016  Primary Care Provider: Jeanann Lewandowsky Primary Cardiologist: Dr. Katrinka Blazing Heart Failure: Dr. Gala Romney Electrophysiologist: Dr. Johney Frame  Discharge Diagnoses    Active Problems:   Acute on chronic systolic HF (heart failure) (HCC)   Atypical atrial flutter (HCC)   Allergies Allergies  Allergen Reactions  . Heparin Other (See Comments)    Will not use pork-derived products due to religious beliefs   . Lovenox [Enoxaparin Sodium] Other (See Comments)    Will not use pork-derived products due to religious beliefs   . Pork-Derived Products Other (See Comments)    Does not eat pork or pork derived products d/t religious reasons    Diagnostic Studies/Procedures    Procedures    A-Flutter/A-Tach/SVT Ablation   See EP Study Note 01/23/16   History of Present Illness     Benjamin [name] is a 55 y.o. male with a past medical history significant for hypertension, hyperlipidemia, mixed cardiomyopathy, chronic systolic heart failure and STJ CRTD. He developed atrial flutter in April of this year and was placed on amiodarone. He also underwent PCI at that time. He has had progressive shortness of breath with exertion and orthopnea since that time and was seen by Dr Katrinka Blazing in the office. Admission for diuresis and rhythm control was recommended and the patient was admitted 01/22/16.    Hospital Course    He was seen by the AHF team who helped to manage acute on chronic systolic heart failure. He was diuresed with IV lasix. Volume and symptoms improved.  EP was asked to evaluate for treatment options for atrial flutter. Ablation was recommended. TEE ruled out LA thrombus and he underwent atrial flutter ablation and DCCV per Dr. Ladona Ridgel on 01/23/16. He was continued on amiodarone and Coumadin. He remained in NSR on telemetry. No complications from his  ablation procedure. He was seen by Dr. Ladona Ridgel on 01/25/16 and was felt to be stable. He was euvolemic on exam and asymptomatic. Dr. Ladona Ridgel determined that he was stable for d/c home. Post hospital f/u has been arranged with Dr. Gala Romney on 02/02/16.      Consultants: Electrophysiology  Discharge Vitals Blood pressure 97/72, pulse 64, temperature 99.1 F (37.3 C), temperature source Oral, resp. rate 18, height 5\' 7"  (1.702 m), weight 226 lb 6.6 oz (102.7 kg), SpO2 95 %.  Filed Weights   01/23/16 0600 01/24/16 0649 01/25/16 0931  Weight: 224 lb 12.8 oz (101.969 kg) 226 lb 6.4 oz (102.694 kg) 226 lb 6.6 oz (102.7 kg)    Labs & Radiologic Studies    CBC  Recent Labs  01/22/16 1128  WBC 6.7  NEUTROABS 4.3  HGB 11.9*  HCT 39.1  MCV 73.1*  PLT 245   Basic Metabolic Panel  Recent Labs  01/22/16 1128 01/23/16 0704 01/24/16 1150  NA 139 139 136  K 4.5 4.0 4.5  CL 107 106 102  CO2 25 25 24   GLUCOSE 138* 123* 182*  BUN 30* 30* 32*  CREATININE 1.38* 1.37* 1.35*  CALCIUM 8.8* 8.7* 8.8*  MG 1.9  --   --    Liver Function Tests  Recent Labs  01/22/16 1128  AST 29  ALT 52  ALKPHOS 72  BILITOT 1.0  PROT 6.4*  ALBUMIN 3.3*   No results for input(s): LIPASE, AMYLASE in the last 72 hours. Cardiac Enzymes  Recent Labs  01/22/16 1128 01/22/16 1613 01/22/16 2232  TROPONINI 0.06* 0.10* 0.06*   BNP Invalid input(s): POCBNP D-Dimer No results for input(s): DDIMER in the last 72 hours. Hemoglobin A1C  Recent Labs  01/22/16 1128  HGBA1C 7.1*   Fasting Lipid Panel No results for input(s): CHOL, HDL, LDLCALC, TRIG, CHOLHDL, LDLDIRECT in the last 72 hours. Thyroid Function Tests  Recent Labs  01/22/16 1613  TSH 1.348   _____________  Dg Chest 2 View  01/12/2016  CLINICAL DATA:  Acute onset of shortness of breath and weight gain. Lower extremity swelling. Initial encounter. EXAM: CHEST  2 VIEW COMPARISON:  Chest radiograph performed 11/15/2015 FINDINGS: The lungs  are well-aerated. Vascular congestion is noted. Mildly increased interstitial markings could reflect minimal interstitial edema. There is no evidence of pleural effusion or pneumothorax. The heart is enlarged. A pacemaker/AICD is noted at the left chest wall, with leads ending at the right atrium, right ventricle and coronary sinus. No acute osseous abnormalities are seen. Clips are noted within the right upper quadrant, reflecting prior cholecystectomy. IMPRESSION: Vascular congestion and cardiomegaly. Mildly increased interstitial markings could reflect minimal interstitial edema. Electronically Signed   By: Roanna Raider M.D.   On: 01/12/2016 03:56   Disposition   Pt is being discharged home today in good condition.  Follow-up Plans & Appointments    Follow-up Information    Follow up with [facility] On 01/27/2016.   Why:  [facility] appointment on 01/27/16 at 2:30 pm with Dr. Venetia Night.   Contact information:   [address] [place] [address] [phone]      Follow up with Arvilla Meres, MD On 02/02/2016.   Specialty:  Cardiology   Why:  2:20 PM    Contact information:   [address] [place] [address] [phone]      Discharge Instructions    Diet - low sodium heart healthy    Complete by:  As directed      Increase activity slowly    Complete by:  As directed            Discharge Medications   Current Discharge Medication List    CONTINUE these medications which have NOT CHANGED   Details  albuterol (PROVENTIL HFA;VENTOLIN HFA) 108 (90 Base) MCG/ACT inhaler Inhale 2 puffs into the lungs every 6 (six) hours as needed for wheezing or shortness of breath. Qty: 1 Inhaler, Refills: 0   Associated Diagnoses: Chronic obstructive pulmonary disease, unspecified COPD type (HCC)    amiodarone (PACERONE) 200 MG tablet Take 1 tablet (200 mg total) by mouth 2 (two) times daily. Qty: 60  tablet, Refills: 5    carvedilol (COREG) 6.25 MG tablet Take 1 tablet (6.25 mg total) by mouth 2 (two) times daily with a meal. Qty: 60 tablet, Refills: 3    clopidogrel (PLAVIX) 75 MG tablet Take 1 tablet (75 mg total) by mouth daily. Qty: 90 tablet, Refills: 3    digoxin (LANOXIN) 0.125 MG tablet Take 1 tablet (0.125 mg total) by mouth daily. Qty: 30 tablet, Refills: 2    furosemide (LASIX) 20 MG tablet Take 40 mg by mouth daily. Refills: 3    insulin aspart (NOVOLOG FLEXPEN) 100 UNIT/ML FlexPen Inject 5-10 Units into the skin 2 (two) times daily as needed for high blood sugar (CBG >140). Dose is based on size of meal    insulin glargine (LANTUS) 100 unit/mL SOPN Inject 15-20 Units into the skin at  bedtime. Patient states uses sliding scale    lisinopril (PRINIVIL,ZESTRIL) 5 MG tablet Take 1 tablet (5 mg total) by mouth daily. Qty: 30 tablet, Refills: 2   Associated Diagnoses: Essential hypertension    metFORMIN (GLUCOPHAGE) 1000 MG tablet Take 1,000 mg by mouth daily with lunch.     nitroGLYCERIN (NITROSTAT) 0.4 MG SL tablet Place 1 tablet (0.4 mg total) under the tongue every 5 (five) minutes x 3 doses as needed for chest pain. Qty: 25 tablet, Refills: 3    potassium chloride (KLOR-CON) 20 MEQ packet Take 20 mEq by mouth every other day.    pregabalin (LYRICA) 75 MG capsule Take 150 mg by mouth at bedtime.    rosuvastatin (CRESTOR) 20 MG tablet Take 1 tablet (20 mg total) by mouth daily. Qty: 90 tablet, Refills: 3    traMADol (ULTRAM) 50 MG tablet Take 50 mg by mouth every 6 (six) hours as needed. (pain) Refills: 0    warfarin (COUMADIN) 2.5 MG tablet Take 1.25 mg by mouth daily. 1/2 tablet every day Refills: 5          Outstanding Labs/Studies   None  Duration of Discharge Encounter   Greater than 30 minutes including physician time.  Signed, Robbie Lis PA-C 01/25/2016, 10:58 AM  EP Attending  Patient seen and examined. He appears to be at his  baseline. He is stable for DC home. He will followup with our advanced heart failure team as well.  Leonia Reeves.D.

## 2016-01-25 NOTE — Progress Notes (Signed)
ANTICOAGULATION CONSULT NOTE - Follow Up Consult  Pharmacy Consult for Coumadin Indication: aflutter  Allergies  Allergen Reactions  . Heparin Other (See Comments)    Will not use pork-derived products due to religious beliefs   . Lovenox [Enoxaparin Sodium] Other (See Comments)    Will not use pork-derived products due to religious beliefs   . Pork-Derived Products Other (See Comments)    Does not eat pork or pork derived products d/t religious reasons    Patient Measurements: Height: 5\' 7"  (170.2 cm) Weight: 226 lb 6.6 oz (102.7 kg) (scale AQ) IBW/kg (Calculated) : 66.1  Vital Signs: Temp: 99.1 F (37.3 C) (06/25 0601) Temp Source: Oral (06/25 0601) BP: 97/72 mmHg (06/25 0601) Pulse Rate: 64 (06/25 0601)  Labs:  Recent Labs  01/22/16 1613 01/22/16 2232 01/23/16 0704 01/24/16 1150  LABPROT  --   --  22.7*  --   INR  --   --  2.01*  --   CREATININE  --   --  1.37* 1.35*  TROPONINI 0.10* 0.06*  --   --     Estimated Creatinine Clearance: 71.4 mL/min (by C-G formula based on Cr of 1.35).  Assessment: 55yom on coumadin pta for aflutter, admitted with volume overload 2/2 recurrent aflutter. EP consulted and he will need DCCV vs ablation. INR is therapeutic at 2.01 on 6/23.  CBC stable on 6/22  Home dose: 1.25mg  daily  Goal of Therapy:  INR 2-3 Monitor platelets by anticoagulation protocol: Yes   Plan:  1) Coumadin 1.25mg  x 1 2) INR qMWF  Nyzaiah Kai C. Marvis Moeller, PharmD Pharmacy Resident  Pager: [phone] 01/25/2016 1:54 PM

## 2016-01-25 NOTE — Progress Notes (Signed)
Patient ID: GARFIELD COINER, male   DOB: [birth year], 55 y.o.   MRN: [id number]    Patient Name: Benjamin [name] Date of Encounter: 01/25/2016     Active Problems:   Acute on chronic systolic HF (heart failure) (HCC)   Atypical atrial flutter (HCC)    SUBJECTIVE  No chest pain or sob.  CURRENT MEDS . amiodarone  200 mg Oral BID  . carvedilol  6.25 mg Oral BID WC  . clopidogrel  75 mg Oral Daily  . digoxin  0.125 mg Oral Daily  . furosemide  80 mg Intravenous Once  . insulin aspart  0-15 Units Subcutaneous TID WC  . insulin glargine  10 Units Subcutaneous QHS  . lisinopril  5 mg Oral Daily  . metFORMIN  1,000 mg Oral Q lunch  . potassium chloride  20 mEq Oral QODAY  . pregabalin  150 mg Oral QHS  . rosuvastatin  20 mg Oral q1800  . sodium chloride flush  3 mL Intravenous Q12H  . Warfarin - Pharmacist Dosing Inpatient   Does not apply q1800    OBJECTIVE  Filed Vitals:   01/24/16 1109 01/24/16 1134 01/24/16 2145 01/25/16 0601  BP: 104/67 103/61 93/62 97/72   Pulse:  60 60 64  Temp:  100.5 F (38.1 C) 99.7 F (37.6 C) 99.1 F (37.3 C)  TempSrc:  Oral Oral Oral  Resp:  Height:      Weight:      SpO2:  98% 95% 95%    Intake/Output Summary (Last 24 hours) at 01/25/16 0912 Last data filed at 01/25/16 0901  Gross per 24 hour  Intake   1200 ml  Output   1750 ml  Net   -550 ml   Filed Weights   01/22/16 1027 01/23/16 0600 01/24/16 0649  Weight: 227 lb 14.4 oz (103.375 kg) 224 lb 12.8 oz (101.969 kg) 226 lb 6.4 oz (102.694 kg)    PHYSICAL EXAM  General: Pleasant, NAD. Neuro: Alert and oriented X 3. Moves all extremities spontaneously. Psych: Normal affect. HEENT:  Normal  Neck: Supple without bruits or JVD. Lungs:  Resp regular and unlabored, CTA. Heart: RRR no s3, s4, or murmurs. Abdomen: Soft, non-tender, non-distended, BS + x 4.  Extremities: No clubbing, cyanosis or edema. DP/PT/Radials 2+ and equal bilaterally.  Accessory Clinical  Findings  CBC  Recent Labs  01/22/16 1128  WBC 6.7  NEUTROABS 4.3  HGB 11.9*  HCT 39.1  MCV 73.1*  PLT 245   Basic Metabolic Panel  Recent Labs  01/22/16 1128 01/23/16 0704 01/24/16 1150  NA 139 139 136  K 4.5 4.0 4.5  CL 107 106 102  CO2 GLUCOSE 138* 123* 182*  BUN 30* 30* 32*  CREATININE 1.38* 1.37* 1.35*  CALCIUM 8.8* 8.7* 8.8*  MG 1.9  --   --    Liver Function Tests  Recent Labs  01/22/16 1128  AST 29  ALT 52  ALKPHOS 72  BILITOT 1.0  PROT 6.4*  ALBUMIN 3.3*   No results for input(s): LIPASE, AMYLASE in the last 72 hours. Cardiac Enzymes  Recent Labs  01/22/16 1128 01/22/16 1613 01/22/16 2232  TROPONINI 0.06* 0.10* 0.06*   BNP Invalid input(s): POCBNP D-Dimer No results for input(s): DDIMER in the last 72 hours. Hemoglobin A1C  Recent Labs  01/22/16 1128  HGBA1C 7.1*   Fasting Lipid Panel No results for input(s): CHOL, HDL, LDLCALC, TRIG, CHOLHDL, LDLDIRECT in the last 72  hours. Thyroid Function Tests  Recent Labs  01/22/16 1613  TSH 1.348    TELE  nsr with BiV Pacing  Radiology/Studies  Dg Chest 2 View  01/12/2016  CLINICAL DATA:  Acute onset of shortness of breath and weight gain. Lower extremity swelling. Initial encounter. EXAM: CHEST  2 VIEW COMPARISON:  Chest radiograph performed 11/15/2015 FINDINGS: The lungs are well-aerated. Vascular congestion is noted. Mildly increased interstitial markings could reflect minimal interstitial edema. There is no evidence of pleural effusion or pneumothorax. The heart is enlarged. A pacemaker/AICD is noted at the left chest wall, with leads ending at the right atrium, right ventricle and coronary sinus. No acute osseous abnormalities are seen. Clips are noted within the right upper quadrant, reflecting prior cholecystectomy. IMPRESSION: Vascular congestion and cardiomegaly. Mildly increased interstitial markings could reflect minimal interstitial edema. Electronically Signed   By:  Roanna Raider M.D.   On: 01/12/2016 03:56    ASSESSMENT AND PLAN  1. Atrial flutter - s/p ablation. Ok for discharge. Continue home meds.  2. Acute on chronic systolic heart failure - he appears back to baseline. Ok for discharge. He will need f/u with Dr. Dorthea Cove. 3. HTN -his blood pressure is stable.  Gregg Taylor,M.D.  01/25/2016 9:12 AM

## 2016-01-26 ENCOUNTER — Telehealth: Payer: Self-pay

## 2016-01-26 ENCOUNTER — Encounter (HOSPITAL_COMMUNITY): Payer: Self-pay | Admitting: Internal Medicine

## 2016-01-26 MED FILL — Heparin Sodium (Porcine) 2 Unit/ML in Sodium Chloride 0.9%: INTRAMUSCULAR | Qty: 500 | Status: AC

## 2016-01-26 NOTE — Telephone Encounter (Signed)
[facility] Post-discharge Follow-Up Phone Call:  Date of Discharge: 01/25/2016 Principal Discharge Diagnosis(es): acute on chronic heart failure, atypical atrial flutter Post-discharge Communication: (Clearly document all attempts clearly and date contact made) call placed to the patient Call Completed: Yes                    With Whom: Patient Interpreter Needed: No     Please check all that apply:  X  Patient is knowledgeable of his/her condition(s) and/or treatment. - He explained that he was admitted to correct an " electrical problem with my heart."  X  Patient is caring for self at home.  ? Patient is receiving assist at home from family and/or caregiver. Family and/or caregiver is knowledgeable of patient's condition(s) and/or treatment. ? Patient is receiving home health services. If so, name of agency.     Medication Reconciliation:  ? Medication list reviewed with patient. X  Patient obtained all discharge medications. If not, why?  He stated that he has all of his medications but is planning to come to [facility] pharmacy this afternoon to pick up some refills that he had ordered prior to his hospitalization. He does not have any new/changed medications.  He also noted that he will talk to Dr Venetia Night tomorrow about having his crestor filled at the [facility] pharmacy instead of the [facility]. He did not have any questions about his medications. Instructed him to bring all of his medications with him to his appointment tomorrow and he stated that he would.  He has a glucometer at home but noted that he has not checked his blood sugar since he has been home.  He said that his blood sugars were " good the whole time in the hospital." Instructed him regarding the importance of resuminig checking his blood sugars as instructed and he stated that he understands.  He said that he has a scale at home but is it not always reliable .  He stated that he is taking his  warfarin and does not have any signs/symptoms of bleeding.    Activities of Daily Living:  X  Independent ? Needs assist (describe; ? home DME used) ? Total Care (describe, ? home DME used)   Community resources in place for patient:  X  None  ? Home Health/Home DME ? Assisted Living ? Support Group         Questions/Concerns discussed: Confirmed his appointment at the [facility] tomorrow, 01/27/16 @ 1430. He also confirmed that he has transportation to the clinic. He reported that he called Medicaid to inquire about the status of his disability and was informed that a decision will be made his week. He spoke of hs frustrations with the length of time it is taking to make a decision about the disability. No other problems/questions reported.

## 2016-01-27 ENCOUNTER — Encounter: Payer: Self-pay | Admitting: Family Medicine

## 2016-01-27 ENCOUNTER — Ambulatory Visit: Payer: Medicaid Other | Attending: Family Medicine | Admitting: Family Medicine

## 2016-01-27 VITALS — BP 117/76 | HR 60 | Temp 98.2°F | Resp 20 | Ht 67.0 in | Wt 225.2 lb

## 2016-01-27 DIAGNOSIS — E1169 Type 2 diabetes mellitus with other specified complication: Secondary | ICD-10-CM | POA: Diagnosis not present

## 2016-01-27 DIAGNOSIS — E119 Type 2 diabetes mellitus without complications: Secondary | ICD-10-CM | POA: Insufficient documentation

## 2016-01-27 DIAGNOSIS — Z95810 Presence of automatic (implantable) cardiac defibrillator: Secondary | ICD-10-CM

## 2016-01-27 DIAGNOSIS — I4892 Unspecified atrial flutter: Secondary | ICD-10-CM | POA: Insufficient documentation

## 2016-01-27 DIAGNOSIS — Z9049 Acquired absence of other specified parts of digestive tract: Secondary | ICD-10-CM | POA: Diagnosis not present

## 2016-01-27 DIAGNOSIS — Z79899 Other long term (current) drug therapy: Secondary | ICD-10-CM | POA: Diagnosis not present

## 2016-01-27 DIAGNOSIS — B081 Molluscum contagiosum: Secondary | ICD-10-CM | POA: Diagnosis not present

## 2016-01-27 DIAGNOSIS — I10 Essential (primary) hypertension: Secondary | ICD-10-CM | POA: Diagnosis not present

## 2016-01-27 DIAGNOSIS — Z7901 Long term (current) use of anticoagulants: Secondary | ICD-10-CM | POA: Insufficient documentation

## 2016-01-27 DIAGNOSIS — Z794 Long term (current) use of insulin: Secondary | ICD-10-CM | POA: Insufficient documentation

## 2016-01-27 DIAGNOSIS — Z9889 Other specified postprocedural states: Secondary | ICD-10-CM | POA: Diagnosis not present

## 2016-01-27 DIAGNOSIS — E785 Hyperlipidemia, unspecified: Secondary | ICD-10-CM

## 2016-01-27 DIAGNOSIS — Z7984 Long term (current) use of oral hypoglycemic drugs: Secondary | ICD-10-CM | POA: Diagnosis not present

## 2016-01-27 LAB — GLUCOSE, POCT (MANUAL RESULT ENTRY): POC GLUCOSE: 163 mg/dL — AB (ref 70–99)

## 2016-01-27 MED ORDER — PODOFILOX 0.5 % EX SOLN: Freq: Two times a day (BID) | CUTANEOUS | Status: AC

## 2016-01-27 MED ORDER — INSULIN GLARGINE 100 UNIT/ML SOLOSTAR PEN: 20.0000 [IU] | PEN_INJECTOR | Freq: Every day | SUBCUTANEOUS | Status: AC

## 2016-01-27 MED ORDER — INSULIN ASPART 100 UNIT/ML FLEXPEN
5.0000 [IU] | PEN_INJECTOR | Freq: Two times a day (BID) | SUBCUTANEOUS | Status: AC | PRN
Start: 2016-01-27 — End: ?

## 2016-01-27 MED ORDER — ROSUVASTATIN CALCIUM 20 MG PO TABS: 20.0000 mg | ORAL_TABLET | Freq: Every day | ORAL | Status: AC

## 2016-01-27 MED ORDER — TRAMADOL HCL 50 MG PO TABS: 50.0000 mg | ORAL_TABLET | Freq: Four times a day (QID) | ORAL | Status: AC | PRN

## 2016-01-27 MED FILL — !NOVOLOG FLEXPEN SYRINGE 1: 100/ML | 30 days supply | Qty: 3 | Fill #0

## 2016-01-27 MED FILL — PODOFILOX 0.5% TOPICAL SOLN: 0.5 | 30 days supply | Qty: 4 | Fill #0

## 2016-01-27 MED FILL — !LANTUS SOLOSTAR 100UNITS/M: 100 | 30 days supply | Qty: 6 | Fill #0

## 2016-01-27 MED FILL — traMADol HCL 50 MG TABS: 50 | 7 days supply | Qty: 30 | Fill #0

## 2016-01-27 MED FILL — ROSUVASTATIN CAL 20 MG TAB: 20 | 30 days supply | Qty: 30 | Fill #0

## 2016-01-27 NOTE — Progress Notes (Signed)
TRANSITIONAL CARE CLINIC  Date of telephone encounter: 01/26/16  PCP: Dr Hyman Hopes  Subjective:  Patient ID: Benjamin [name], male    DOB: [birth year]  Age: 55 y.o. MRN: [id number]  CC: Transitional Care Visit  HPI Benjamin [name] is a type 2 diabetes mellitus (A1c 7.1), hypertension, CHF (EF 20% status post AICD closely followed by the heart failure clinic), atrial flutter who was referred to the ED for admission for IV diuresis by his cardiologist due to volume overload after he had complained of shortness of breath at an office visit.  He received IV diuresis with improvement in symptoms.Electrophysiology was consulted and he underwent a successful atrial flutter ablation and DCCV after left atrium thrombus was ruled out with a TEE. He tolerated the procedure well and was subsequently discharged with recommendations to follow-up with cardiology outpatient.  He informs me today he feels fine and denies shortness of breath, chest pain. He does complain of a rash in his scrotum and inner thighs for the last 2 months which is not painful but sometimes mildly pruritic. He is requesting tramadol for pain at the site of the cardiac cath.  Past Medical History  Diagnosis Date  . Hypertension   . High cholesterol   . Cholelithiasis   . Chronic systolic heart failure (HCC)   . AICD (automatic cardioverter/defibrillator) present   . Kidney stone   . Pneumonia 2002  . Type II diabetes mellitus (HCC)   . GERD (gastroesophageal reflux disease)   . Coronary artery disease     distal vessel disease  . NSTEMI (non-ST elevated myocardial infarction) [facility])     Past Surgical History  Procedure Laterality Date  . Colonoscopy N/A 07/12/2013    Procedure: COLONOSCOPY;  Surgeon: Shirley Friar, MD;  Location: [facility] ENDOSCOPY;  Service: Endoscopy;  Laterality: N/A;  . Ep implantable device N/A 05/09/2015    Procedure: BiV ICD Insertion CRT-D;  Surgeon: Marinus Maw, MD;  Location: [facility] INVASIVE CV LAB;   Service: Cardiovascular;  Laterality: N/A;  . Cholecystectomy  10/09/2015    Procedure: LAPAROSCOPIC CHOLECYSTECTOMY;  Surgeon: Claud Kelp, MD;  Location: [facility] OR;  Service: General;;  . Cardiac catheterization N/A 10/07/2015    Procedure: Left Heart Cath and Coronary Angiography;  Surgeon: Lyn Records, MD;  Location: [facility] INVASIVE CV LAB;  Service: Cardiovascular;  Laterality: N/A;  . Cardiac catheterization N/A 11/05/2015    Procedure: Coronary Stent Intervention Rotablater;  Surgeon: Lyn Records, MD;  Location: [facility] INVASIVE CV LAB;  Service: Cardiovascular;  Laterality: N/A;  . Electrophysiologic study N/A 01/23/2016    Procedure: A-Flutter/A-Tach/SVT Ablation;  Surgeon: Marinus Maw, MD;  Location: [facility] INVASIVE CV LAB;  Service: Cardiovascular;  Laterality: N/A;     Outpatient Prescriptions Prior to Visit  Medication Sig Dispense Refill  . albuterol (PROVENTIL HFA;VENTOLIN HFA) 108 (90 Base) MCG/ACT inhaler Inhale 2 puffs into the lungs every 6 (six) hours as needed for wheezing or shortness of breath. 1 Inhaler 0  . amiodarone (PACERONE) 200 MG tablet Take 1 tablet (200 mg total) by mouth 2 (two) times daily. 60 tablet 5  . carvedilol (COREG) 6.25 MG tablet Take 1 tablet (6.25 mg total) by mouth 2 (two) times daily with a meal. 60 tablet 3  . clopidogrel (PLAVIX) 75 MG tablet Take 1 tablet (75 mg total) by mouth daily. 90 tablet 3  . digoxin (LANOXIN) 0.125 MG tablet Take 1 tablet (0.125 mg total) by mouth daily. 30 tablet 2  .  furosemide (LASIX) 20 MG tablet Take 40 mg by mouth daily.  3  . lisinopril (PRINIVIL,ZESTRIL) 5 MG tablet Take 1 tablet (5 mg total) by mouth daily. 30 tablet 2  . metFORMIN (GLUCOPHAGE) 1000 MG tablet Take 1,000 mg by mouth daily with lunch.     . potassium chloride (KLOR-CON) 20 MEQ packet Take 20 mEq by mouth every other day.    . pregabalin (LYRICA) 75 MG capsule Take 150 mg by mouth at bedtime.    Marland Kitchen warfarin (COUMADIN) 2.5 MG tablet Take 1.25 mg by mouth daily.  1/2 tablet every day  5  . insulin aspart (NOVOLOG FLEXPEN) 100 UNIT/ML FlexPen Inject 5-10 Units into the skin 2 (two) times daily as needed for high blood sugar (CBG >140). Dose is based on size of meal    . insulin glargine (LANTUS) 100 unit/mL SOPN Inject 15-20 Units into the skin at bedtime. Patient states uses sliding scale    . rosuvastatin (CRESTOR) 20 MG tablet Take 1 tablet (20 mg total) by mouth daily. 90 tablet 3  . traMADol (ULTRAM) 50 MG tablet Take 50 mg by mouth every 6 (six) hours as needed. (pain)  0  . nitroGLYCERIN (NITROSTAT) 0.4 MG SL tablet Place 1 tablet (0.4 mg total) under the tongue every 5 (five) minutes x 3 doses as needed for chest pain. (Patient not taking: Reported on 01/27/2016) 25 tablet 3   No facility-administered medications prior to visit.    ROS Review of Systems  Constitutional: Negative for activity change and appetite change.  HENT: Negative for sinus pressure and sore throat.   Eyes: Negative for visual disturbance.  Respiratory: Negative for cough, chest tightness and shortness of breath.   Cardiovascular: Negative for chest pain and leg swelling.  Gastrointestinal: Negative for abdominal pain, diarrhea, constipation and abdominal distention.  Endocrine: Negative.   Genitourinary: Negative for dysuria.       Pain in groin  Musculoskeletal: Negative for myalgias and joint swelling.  Skin:       See hpi  Allergic/Immunologic: Negative.   Neurological: Negative for weakness, light-headedness and numbness.  Psychiatric/Behavioral: Negative for suicidal ideas and dysphoric mood.    Objective:  BP 117/76 mmHg  Pulse 60  Temp(Src) 98.2 F (36.8 C) (Oral)  Resp 20  Ht 5\' 7"  (1.702 m)  Wt 225 lb 3.2 oz (102.15 kg)  BMI 35.26 kg/m2  SpO2 95%  BP/Weight 01/27/2016 01/25/2016 01/22/2016  Systolic BP 117 97 -  Diastolic BP 76 72 -  Wt. (Lbs) 225.2 226.41 -  BMI 35.26 - 35.45      Physical Exam  Constitutional: He is oriented to person,  place, and time. He appears well-developed and well-nourished.  Cardiovascular: Normal rate, normal heart sounds and intact distal pulses.   No murmur heard. Pulmonary/Chest: Effort normal and breath sounds normal. He has no wheezes. He has no rales. He exhibits no tenderness.  Abdominal: Soft. Bowel sounds are normal. He exhibits no distension and no mass. There is no tenderness.  Genitourinary:  Rash with central umbilication sparsely distributed in scrotum and thigh  Musculoskeletal: Normal range of motion. He exhibits edema (1+ ankle edema and right foot; left foot is negative for edema).  Neurological: He is alert and oriented to person, place, and time.  Skin: Skin is warm and dry.  Bruise in right groin  Psychiatric: He has a normal mood and affect.    Lab Results  Component Value Date   HGBA1C 7.1* 01/22/2016  CMP Latest Ref Rng 01/24/2016 01/23/2016 01/22/2016  Glucose 65 - 99 mg/dL 295(M) 841(L) 244(W)  BUN 6 - 20 mg/dL 10(U) 72(Z) 36(U)  Creatinine 0.61 - 1.24 mg/dL 4.40(H) 4.74(Q) 5.95(G)  Sodium 135 - 145 mmol/L 136 139 139  Potassium 3.5 - 5.1 mmol/L 4.5 4.0 4.5  Chloride 101 - 111 mmol/L 102 106 107  CO2 22 - 32 mmol/L Calcium 8.9 - 10.3 mg/dL 3.8(V) 5.6(E) 3.3(I)  Total Protein 6.5 - 8.1 g/dL - - 6.4(L)  Total Bilirubin 0.3 - 1.2 mg/dL - - 1.0  Alkaline Phos 38 - 126 U/L - - 72  AST 15 - 41 U/L - - 29  ALT 17 - 63 U/L - - 52    Lipid Panel     Component Value Date/Time   CHOL 105 11/04/2015 0728   TRIG 97 11/04/2015 0728   HDL 29* 11/04/2015 0728   CHOLHDL 3.6 11/04/2015 0728   VLDL 19 11/04/2015 0728   LDLCALC 57 11/04/2015 0728      Assessment & Plan:   1. Type 2 diabetes mellitus with other specified complication, with long-term current use of insulin (HCC) A1c 7.1 Continue medications - Glucose (CBG) - insulin aspart (NOVOLOG FLEXPEN) 100 UNIT/ML FlexPen; Inject 5-10 Units into the skin 2 (two) times daily as needed for high blood  sugar (CBG >140). Dose is based on size of meal  Dispense: 15 mL; Refill: 3 - Insulin Glargine (LANTUS SOLOSTAR) 100 UNIT/ML Solostar Pen; Inject 20 Units into the skin daily at 10 pm.  Dispense: 5 pen; Refill: 3  2. Essential hypertension Controlled  3. Atrial flutter with rapid ventricular response (HCC) Status post atrial flutter ablation in DCCV Keep appointment with electrophysiology cardiologist.  4. Molluscum contagiosum - podofilox (CONDYLOX) 0.5 % external solution; Apply topically 2 (two) times daily. For 3 days then off for 4 days; repeat every week for 4 weeks.  Dispense: 3.5 mL; Refill: 1  5. Hyperlipidemia with target LDL less than 100  rosuvastatin (CRESTOR) 20 MG tablet; Take 1 tablet (20 mg total) by mouth daily.  Dispense: 30 tablet; Refill: 3  6. S/P ICD (internal cardiac defibrillator) procedure    Meds ordered this encounter  Medications  . rosuvastatin (CRESTOR) 20 MG tablet    Sig: Take 1 tablet (20 mg total) by mouth daily.    Dispense:  30 tablet    Refill:  3  . traMADol (ULTRAM) 50 MG tablet    Sig: Take 1 tablet (50 mg total) by mouth every 6 (six) hours as needed. (pain)    Dispense:  30 tablet    Refill:  0  . insulin aspart (NOVOLOG FLEXPEN) 100 UNIT/ML FlexPen    Sig: Inject 5-10 Units into the skin 2 (two) times daily as needed for high blood sugar (CBG >140). Dose is based on size of meal    Dispense:  15 mL    Refill:  3  . Insulin Glargine (LANTUS SOLOSTAR) 100 UNIT/ML Solostar Pen    Sig: Inject 20 Units into the skin daily at 10 pm.    Dispense:  5 pen    Refill:  3  . podofilox (CONDYLOX) 0.5 % external solution    Sig: Apply topically 2 (two) times daily. For 3 days then off for 4 days; repeat every week for 4 weeks.    Dispense:  3.5 mL    Refill:  1    Follow-up: Return in about 2 weeks (around 02/10/2016) for  TCC -follow up on Diabetes Mellitus.   Jaclyn Shaggy MD

## 2016-01-27 NOTE — Progress Notes (Signed)
Medication refill- tramadol, crestor Hospital follow up

## 2016-01-27 NOTE — Patient Instructions (Signed)
Molluscum Contagiosum, Adult  Molluscum contagiosum is a skin infection that can cause a rash. When molluscum contagiosum affects the genital area, it is called a sexually transmitted disease (STD).  CAUSES  Molluscum contagiosum is caused by a virus. The virus can spread easily from person to person through:  · Skin-to-skin contact with an infected person.  · Contact with an infected object, such as a towel or clothing.  RISK FACTORS  You may be at higher risk for molluscum contagiosum if you:  · Live in an area where the weather is moist and warm.  · Have a weak body defense system (immune system).  SIGNS AND SYMPTOMS  The main symptom is a rash that appears 2-7 weeks after exposure to the virus. It is made up of 2-20 small, firm, dome-shaped bumps that may:  · Be pink or flesh-colored.  · Appear alone or in groups.  · Range from the size of a pinhead to the size of a pencil eraser.  · Feel smooth and waxy.  · Have a pit in the middle.  · Itch. The rash does not itch for most people.  The bumps often appears on the genitals, thighs, face, neck, and abdomen.  DIAGNOSIS   A health care provider can usually diagnose molluscum contagiosum by looking at the bumps on your skin. To confirm the diagnosis, your health care provider may scrape the bumps to collect a skin sample to examine under a microscope.  TREATMENT  The bumps may go away on their own, but people often have treatment to keep the virus from infecting someone else or to keep the rash from spreading to other body parts. Treatment may include:  · Surgery to remove the bumps by freezing them (cryosurgery).  · A procedure to scrape off the bumps (curettage).  · A procedure to remove the bumps with a laser.  · Putting medicine on the bumps (topical treatment).  Sometimes no treatment is needed.   HOME CARE INSTRUCTIONS  · Take medicines only as directed by your health care provider.  · As long as you have bumps on your skin, the infection can spread to others  and to other parts of your body. To prevent this from happening:    Do not scratch the bumps.    Do not share clothing or towels with others until the bumps disappear.      Avoid close contact with others until the bumps disappear. This includes sexual contact.     Wash your hands often.    Cover the bumps with clothing or a bandage when you will be near other people.  SEEK MEDICAL CARE IF:  · The bumps are spreading.  · The bumps are becoming red and sore.  · The bumps have not gone away after 12 months.     This information is not intended to replace advice given to you by your health care provider. Make sure you discuss any questions you have with your health care provider.     Document Released: 02/13/2014 Document Reviewed: 02/13/2014  Elsevier Interactive Patient Education ©2016 [employer] Inc[employer]

## 2016-01-29 ENCOUNTER — Telehealth: Payer: Self-pay

## 2016-01-29 NOTE — Telephone Encounter (Signed)
This Case Manager placed call to patient to check on status and to discuss scheduling Transitional Care follow-up appointment as Dr. Venetia Night wants him to follow-up with her around 02/10/16. Patient denied any problems or health concerns at this time. Inquired if patient picked up medications Dr. Venetia Night prescribed at his appointment on 01/27/16. He indicated he planned to pick up medications today at [facility] pharmacy. Patient denied any questions about his medications. Discussed scheduling Transitional Care Clinic appointment, and patient agreeable. Appointment scheduled for 02/10/16 at 1430 with Dr. Venetia Night. Also reminded patient of his upcoming appointment at CHF Clinic on 02/02/16 at 1420 with Dr. Gala Romney. Also reminded him of appointment for echocardiogram on 02/11/16 at 1300 and Coumadin Clinic appointment on 02/11/16 at 1400. Patient appreciative of reminders. No additional needs/concerns identified.

## 2016-02-02 ENCOUNTER — Ambulatory Visit (HOSPITAL_COMMUNITY)
Admission: RE | Admit: 2016-02-02 | Discharge: 2016-02-02 | Disposition: A | Discharge status: Home / Self Care | Payer: Medicaid Other | Source: Ambulatory Visit | Attending: Internal Medicine | Admitting: Internal Medicine

## 2016-02-02 VITALS — BP 122/82 | HR 60 | Wt 221.0 lb

## 2016-02-02 DIAGNOSIS — Z833 Family history of diabetes mellitus: Secondary | ICD-10-CM | POA: Insufficient documentation

## 2016-02-02 DIAGNOSIS — Z823 Family history of stroke: Secondary | ICD-10-CM | POA: Insufficient documentation

## 2016-02-02 DIAGNOSIS — I484 Atypical atrial flutter: Secondary | ICD-10-CM | POA: Diagnosis not present

## 2016-02-02 DIAGNOSIS — Z7901 Long term (current) use of anticoagulants: Secondary | ICD-10-CM | POA: Insufficient documentation

## 2016-02-02 DIAGNOSIS — I252 Old myocardial infarction: Secondary | ICD-10-CM | POA: Insufficient documentation

## 2016-02-02 DIAGNOSIS — Z79899 Other long term (current) drug therapy: Secondary | ICD-10-CM | POA: Insufficient documentation

## 2016-02-02 DIAGNOSIS — I2510 Atherosclerotic heart disease of native coronary artery without angina pectoris: Secondary | ICD-10-CM | POA: Diagnosis not present

## 2016-02-02 DIAGNOSIS — K219 Gastro-esophageal reflux disease without esophagitis: Secondary | ICD-10-CM | POA: Insufficient documentation

## 2016-02-02 DIAGNOSIS — E7800 Pure hypercholesterolemia, unspecified: Secondary | ICD-10-CM | POA: Diagnosis not present

## 2016-02-02 DIAGNOSIS — Z794 Long term (current) use of insulin: Secondary | ICD-10-CM | POA: Insufficient documentation

## 2016-02-02 DIAGNOSIS — Z7902 Long term (current) use of antithrombotics/antiplatelets: Secondary | ICD-10-CM | POA: Diagnosis not present

## 2016-02-02 DIAGNOSIS — I480 Paroxysmal atrial fibrillation: Secondary | ICD-10-CM | POA: Insufficient documentation

## 2016-02-02 DIAGNOSIS — Z7982 Long term (current) use of aspirin: Secondary | ICD-10-CM | POA: Diagnosis not present

## 2016-02-02 DIAGNOSIS — I5022 Chronic systolic (congestive) heart failure: Secondary | ICD-10-CM | POA: Diagnosis not present

## 2016-02-02 DIAGNOSIS — E785 Hyperlipidemia, unspecified: Secondary | ICD-10-CM | POA: Diagnosis not present

## 2016-02-02 DIAGNOSIS — Z87442 Personal history of urinary calculi: Secondary | ICD-10-CM | POA: Insufficient documentation

## 2016-02-02 DIAGNOSIS — Z95810 Presence of automatic (implantable) cardiac defibrillator: Secondary | ICD-10-CM | POA: Diagnosis not present

## 2016-02-02 DIAGNOSIS — I428 Other cardiomyopathies: Secondary | ICD-10-CM | POA: Insufficient documentation

## 2016-02-02 DIAGNOSIS — I110 Hypertensive heart disease with heart failure: Secondary | ICD-10-CM | POA: Insufficient documentation

## 2016-02-02 DIAGNOSIS — I255 Ischemic cardiomyopathy: Secondary | ICD-10-CM | POA: Diagnosis not present

## 2016-02-02 DIAGNOSIS — E119 Type 2 diabetes mellitus without complications: Secondary | ICD-10-CM | POA: Insufficient documentation

## 2016-02-02 DIAGNOSIS — Z8249 Family history of ischemic heart disease and other diseases of the circulatory system: Secondary | ICD-10-CM | POA: Diagnosis not present

## 2016-02-02 MED ORDER — SACUBITRIL-VALSARTAN 24-26 MG PO TABS: 1.0000 | ORAL_TABLET | Freq: Two times a day (BID) | ORAL | Status: AC

## 2016-02-02 MED ORDER — AMIODARONE HCL 200 MG PO TABS: 200.0000 mg | ORAL_TABLET | Freq: Every day | ORAL | Status: DC

## 2016-02-02 NOTE — Patient Instructions (Signed)
Stop Lisinopril  Start Entresto 24/26 mg Twice daily STARTING ON WED 7/5 AM  Decrease Amiodarone to 200 mg DAILY  Take Furosemide (Lasix) 40 mg Twice daily FOR 2 DAYS, then back to 40 mg daily  Labs in 2 weeks  You have been referred to Erika, Pharm D in 2 weeks  Your physician recommends that you schedule a follow-up appointment in: 6 weeks

## 2016-02-02 NOTE — Progress Notes (Signed)
[name]  Patient ID: Benjamin [name], male   DOB: 27-Jul-1961, 55 y.o.   MRN: [id number] PCP: Primary Cardiologist: Dr Ladona Ridgel   HPI: [name] is 55 year old male with history of nonischemic cardiomyopathy hypertension, diabetes mellitus, hyperlipidemia, chronic systolic heart failure, S/P St Jude bi-V ICD implanted October 2016. His last echocardiogram was August 2016 ejection fraction was 20% with diffuse hypokinesis. Mild LVH. Mild MR. Left atrium severely dilated.  Admitted in early March with abdominal pain and increased SOB with exertion. Cardiology consulted for surgical clearance. Due to many risk factors he was sent for [facility]. LHC showed complex, calcified, multivessel coronary disease with high-grade obstruction in the mid LAD and distal RCA. He had laparoscopic cholecystectomy. Plan for 2V PCI by Dr Katrinka Blazing after he recovered from cholecystectomy. Diuresed with IV lasix and transitioned to lasix 40 mg daily. Discharge weight was 243 pounds.   Admitted in April with AFL. Converted with amio. And then underwent 2-V PCI with rotoblator under Impella support.   Admitted June 2017 for volume overload in setting of recurrent AFL. Had successful A flutter ablation. Diuresed with IV lasix and transitioned to 40 mg lasix daily. He continued on amiodarone and coumadin. Discharge weight was 226 pounds.   Today he returns for post hospital follow up. Overall feels much better.  SOB with steps. + Orthopnea. Denies SOB/PND. No bleeding problems. No CP.  Complaining of leg edema R>L. Weight at home 221-223 pounds. Taking all medications. Currently not working.   PFTS 5/17 FEV1 2.11 (61%) FVC 3.06 (68%) DLCO 72%  Labs 4/17  K 4.5 Cr 1.25 Labs 01/24/2016: K 4.5 Creatinine 1.38   LHC 10/07/2015  - Dr Katrinka Blazing  Prox RCA to Mid RCA lesion, 20% stenosed.  Dist RCA lesion, 90% stenosed.  RPDA lesion, 90% stenosed.  1st Diag lesion, 100% stenosed.  Mid LAD lesion, 95% stenosed.  Dist LAD  lesion, 90% stenosed.  Ost Cx lesion, 60% stenosed.  2nd Mrg lesion, 95% stenosed.  Prox Cx to Mid Cx lesion, 60% stenosed.  LVEF 20-25%.      ROS: All systems negative except as listed in HPI, PMH and Problem List.  SH:  Social History   Social History  . Marital Status: Married    Spouse Name: N/A  . Number of Children: N/A  . Years of Education: N/A   Occupational History  . Not on file.   Social History Main Topics  . Smoking status: Never Smoker   . Smokeless tobacco: Never Used  . Alcohol Use: No  . Drug Use: No  . Sexual Activity: No   Other Topics Concern  . Not on file   Social History Narrative    FH:  Family History  Problem Relation Age of Onset  . Diabetes Mother   . Hypertension Mother   . Hyperlipidemia Mother   . Stroke Mother   . Diabetes Father   . Hypertension Father   . Stroke Father 66    Past Medical History  Diagnosis Date  . Hypertension   . High cholesterol   . Cholelithiasis   . Chronic systolic heart failure (HCC)   . AICD (automatic cardioverter/defibrillator) present   . Kidney stone   . Pneumonia 2002  . Type II diabetes mellitus (HCC)   . GERD (gastroesophageal reflux disease)   . Coronary artery disease     distal vessel disease  . NSTEMI (non-ST elevated myocardial infarction) [facility])     Current Outpatient Prescriptions  Medication Sig Dispense Refill  . albuterol (PROVENTIL HFA;VENTOLIN HFA) 108 (90 Base) MCG/ACT inhaler Inhale 2 puffs into the lungs every 6 (six) hours as needed for wheezing or shortness of breath. 1 Inhaler 0  . amiodarone (PACERONE) 200 MG tablet Take 1 tablet (200 mg total) by mouth 2 (two) times daily. 60 tablet 5  . carvedilol (COREG) 6.25 MG tablet Take 1 tablet (6.25 mg total) by mouth 2 (two) times daily with a meal. 60 tablet 3  . clopidogrel (PLAVIX) 75 MG tablet Take 1 tablet (75 mg total) by mouth daily. 90 tablet 3  . digoxin (LANOXIN) 0.125 MG tablet Take 1 tablet (0.125 mg  total) by mouth daily. 30 tablet 2  . furosemide (LASIX) 20 MG tablet Take 40 mg by mouth daily.  3  . insulin aspart (NOVOLOG FLEXPEN) 100 UNIT/ML FlexPen Inject 5-10 Units into the skin 2 (two) times daily as needed for high blood sugar (CBG >140). Dose is based on size of meal 15 mL 3  . Insulin Glargine (LANTUS SOLOSTAR) 100 UNIT/ML Solostar Pen Inject 20 Units into the skin daily at 10 pm. 5 pen 3  . lisinopril (PRINIVIL,ZESTRIL) 5 MG tablet Take 1 tablet (5 mg total) by mouth daily. 30 tablet 2  . metFORMIN (GLUCOPHAGE) 1000 MG tablet Take 1,000 mg by mouth daily with lunch.     . nitroGLYCERIN (NITROSTAT) 0.4 MG SL tablet Place 1 tablet (0.4 mg total) under the tongue every 5 (five) minutes x 3 doses as needed for chest pain. 25 tablet 3  . podofilox (CONDYLOX) 0.5 % external solution Apply topically 2 (two) times daily. For 3 days then off for 4 days; repeat every week for 4 weeks. 3.5 mL 1  . potassium chloride (KLOR-CON) 20 MEQ packet Take 20 mEq by mouth every other day.    . pregabalin (LYRICA) 75 MG capsule Take 150 mg by mouth at bedtime.    . rosuvastatin (CRESTOR) 20 MG tablet Take 1 tablet (20 mg total) by mouth daily. 30 tablet 3  . traMADol (ULTRAM) 50 MG tablet Take 1 tablet (50 mg total) by mouth every 6 (six) hours as needed. (pain) 30 tablet 0  . warfarin (COUMADIN) 2.5 MG tablet Take 1.25 mg by mouth daily. 1/2 tablet every day  5   No current facility-administered medications for this encounter.    Filed Vitals:   02/02/16 1438  BP: 122/82  Pulse: 60  Weight: 221 lb (100.245 kg)  SpO2: 92%    PHYSICAL EXAM: General:  Well appearing. No resp difficulty HEENT: normal Neck: supple. JVP ~9. Carotids 2+ bilaterally; no bruits. No lymphadenopathy or thryomegaly appreciated. Cor: PMI normal. Irregular rate & rhythm. No rubs, gallops or murmurs. Lungs: clear Abdomen: obese, soft, nontender, nondistended. No hepatosplenomegaly. No bruits or masses. Good bowel  sounds. Extremities: no cyanosis, clubbing, rash, R>L 2+ >1+  edema Neuro: alert & orientedx3, cranial nerves grossly intact. Moves all 4 extremities w/o difficulty. Affect pleasant.  EKG: A sensed -Vpaced 60 bpm   ASSESSMENT & PLAN: 1. Chronic  Systolic Heart Failure-Mixed ICM/NICM. ECHO 2016 with EF 20%. Has St Jude ICD.  - NYHA II-III. - Volume status elevated. Increase lasix 40 mg /40mg  for 2 days. Then he will go back to lasix 40 mg daily.   - Continue carvedilol 6.25 mg twice a day. Continue dig 0.125 mg daily.  - Stop lisinopril and allow 36 hour washout then start entresto 24-26 mg - He has had hyperkalemia in the  past with spiro  and does not want to re-challenge.   - Reinfoced low salt diet, daily weights, and limiting fluid intake to < 2 liters per day.  2.CAD- Multivessel CAD- LHC 10/2015 with Complex, calcified, multivessel coronary disease with high-grade obstruction in the mid LAD and distal RCA.  - s/p 2v PC in 4./17 - On aspirin, plavix and warfarin, crestor, bb. Followed by Dr. Katrinka Blazing. Off ASA.   3. PAF/AFL - S/P AFL ablation. Cut amio back to 200 mg daily.   4. HTN- stable- BP actually low at times 5. Hyperlipidemia- Continue crestor daily  Refer to cardiac rehab.   Amy Clegg NP-C   3:10 PM    Patient seen and examined with Tonye Becket, NP. We discussed all aspects of the encounter. I agree with the assessment and plan as stated above.   Overall improved with restoration of NSR. NYHA II-III. Mildly volume overloaded. Will increase lasix for 2 days and switch lisinopril to Entresto. Watch volume status and BP closely. Maintaining NSR after AFL ablation. Cut amio to 200 dail and can eventually wean off. Continue warfarin and plavix. Now off ASA.  Refer CR. Will need CPX testing in near future to further assess degree of HF.   Zackery Brine,MD 12:19 AM

## 2016-02-04 MED FILL — ENTRESTO 24 MG-26 MG TABLET: 24-26 | 30 days supply | Qty: 60 | Fill #0

## 2016-02-05 ENCOUNTER — Telehealth: Payer: Self-pay

## 2016-02-05 NOTE — Telephone Encounter (Signed)
This RN called patient to find out how he was feeling and to remind him of his appt with Dr Venetia Night next Tuesday 02/10/16.  LVM reminding patient of his scheduled appt and encouraged him to call back to update team on his condition.

## 2016-02-09 ENCOUNTER — Telehealth: Payer: Self-pay

## 2016-02-09 NOTE — Telephone Encounter (Signed)
Call placed to the patient and confirmed his appointment at the [facility] tomorrow, 02/10/16 @ 1430. He said that he would be there and he has transportation to the clinic. He also confirmed that he has an appointment with cardiology and an INR check on 02/11/16.   Reminded him to bring his medications to his appointment and he stated that he would.  He stated that his scale is still not working. Informed him that this CM would contact the HF clinic to inquire about another scale.   Regarding his disability and medicaid application - He said that he still has not received a decision but plans to go to DSS possibly today to check on the status of the application. He noted that he needs to obtain a letter of acceptance or denial for the [facility] pharmacy in order to continue to receive his medications. He hopes to have the letter to give to the [facility] pharmacy tomorrow.   He stated that he is feeling "fine" and reported no problems/ questions at this time.   Voice mail message left for Zetta Bills, HF Clinic inquiring about obtaining another scale for the patient.

## 2016-02-10 ENCOUNTER — Ambulatory Visit: Payer: Medicaid Other | Attending: Family Medicine | Admitting: Family Medicine

## 2016-02-10 ENCOUNTER — Telehealth: Payer: Self-pay

## 2016-02-10 ENCOUNTER — Encounter: Payer: Self-pay | Admitting: Family Medicine

## 2016-02-10 VITALS — BP 102/68 | HR 61 | Temp 98.3°F | Wt 213.4 lb

## 2016-02-10 DIAGNOSIS — E785 Hyperlipidemia, unspecified: Secondary | ICD-10-CM | POA: Diagnosis not present

## 2016-02-10 DIAGNOSIS — E118 Type 2 diabetes mellitus with unspecified complications: Secondary | ICD-10-CM | POA: Diagnosis not present

## 2016-02-10 DIAGNOSIS — I10 Essential (primary) hypertension: Secondary | ICD-10-CM | POA: Diagnosis not present

## 2016-02-10 DIAGNOSIS — Z95810 Presence of automatic (implantable) cardiac defibrillator: Secondary | ICD-10-CM | POA: Diagnosis not present

## 2016-02-10 DIAGNOSIS — Z794 Long term (current) use of insulin: Secondary | ICD-10-CM

## 2016-02-10 DIAGNOSIS — I4892 Unspecified atrial flutter: Secondary | ICD-10-CM

## 2016-02-10 LAB — GLUCOSE, POCT (MANUAL RESULT ENTRY): POC GLUCOSE: 192 mg/dL — AB (ref 70–99)

## 2016-02-10 MED ORDER — CARVEDILOL 3.125 MG PO TABS: 3.1250 mg | ORAL_TABLET | Freq: Two times a day (BID) | ORAL | Status: AC

## 2016-02-10 MED FILL — CARVEDILOL 3.125 MG TABLET: 3.125 | 30 days supply | Qty: 60 | Fill #0

## 2016-02-10 MED FILL — FUROSEMIDE 20 MG TABLET: 20 | 15 days supply | Qty: 30 | Fill #1

## 2016-02-10 NOTE — Telephone Encounter (Signed)
A scale was obtained for the patient from the heart failure clinic and given to him at his appointment today. He was very Adult nurse. He informed this CM that he received a call from DSS and was notified that he has been approved for medicaid. He said that he was told that the medicaid information would be " in the system" tomorrow. He said that his disability status is still pending.

## 2016-02-10 NOTE — Progress Notes (Signed)
TRANSITIONAL CARE CLINIC  Date of telephone encounter: 01/26/16  PCP: Dr Hyman Hopes  Subjective:    Patient ID: Benjamin [name], male    DOB: 1960/10/06, 55 y.o.   MRN: [id number]  HPI He is a 55 year old male with type 2 diabetes mellitus (A1c 7.1), hypertension, CHF (EF 20% status post AICD closely followed by the heart failure clinic), atrial flutter who was referred to the ED for admission for IV diuresis by his cardiologist due to volume overload after he had complained of shortness of breath at an office visit. He received IV diuresis with improvement in symptoms.Electrophysiology was consulted and he underwent a successful atrial flutter ablation and DCCV after left atrium thrombus was ruled out with a TEE. He tolerated the procedure well and was subsequently discharged with recommendations to follow-up with cardiology outpatient.  He informs me today he feels fine and denies shortness of breath, chest pain; However he complains of feeling dizzy and has been having a low blood pressure at home. He was provided with the skin of the clinic for measurement of daily weights.  Past Medical History  Diagnosis Date  . Hypertension   . High cholesterol   . Cholelithiasis   . Chronic systolic heart failure (HCC)   . AICD (automatic cardioverter/defibrillator) present   . Kidney stone   . Pneumonia 2002  . Type II diabetes mellitus (HCC)   . GERD (gastroesophageal reflux disease)   . Coronary artery disease     distal vessel disease  . NSTEMI (non-ST elevated myocardial infarction) [facility])     Past Surgical History  Procedure Laterality Date  . Colonoscopy N/A 07/12/2013    Procedure: COLONOSCOPY;  Surgeon: Shirley Friar, MD;  Location: [facility] ENDOSCOPY;  Service: Endoscopy;  Laterality: N/A;  . Ep implantable device N/A 05/09/2015    Procedure: BiV ICD Insertion CRT-D;  Surgeon: Marinus Maw, MD;  Location: [facility] INVASIVE CV LAB;  Service: Cardiovascular;  Laterality: N/A;  .  Cholecystectomy  10/09/2015    Procedure: LAPAROSCOPIC CHOLECYSTECTOMY;  Surgeon: Claud Kelp, MD;  Location: [facility] OR;  Service: General;;  . Cardiac catheterization N/A 10/07/2015    Procedure: Left Heart Cath and Coronary Angiography;  Surgeon: Lyn Records, MD;  Location: [facility] INVASIVE CV LAB;  Service: Cardiovascular;  Laterality: N/A;  . Cardiac catheterization N/A 11/05/2015    Procedure: Coronary Stent Intervention Rotablater;  Surgeon: Lyn Records, MD;  Location: [facility] INVASIVE CV LAB;  Service: Cardiovascular;  Laterality: N/A;  . Electrophysiologic study N/A 01/23/2016    Procedure: A-Flutter/A-Tach/SVT Ablation;  Surgeon: Marinus Maw, MD;  Location: [facility] INVASIVE CV LAB;  Service: Cardiovascular;  Laterality: N/A;    Allergies  Allergen Reactions  . Heparin Other (See Comments)    Will not use pork-derived products due to religious beliefs   . Lovenox [Enoxaparin Sodium] Other (See Comments)    Will not use pork-derived products due to religious beliefs   . Pork-Derived Products Other (See Comments)    Does not eat pork or pork derived products d/t religious reasons     Review of Systems Constitutional: Negative for activity change and appetite change.  HENT: Negative for sinus pressure and sore throat.   Eyes: Negative for visual disturbance.  Respiratory: Negative for cough, chest tightness and shortness of breath.   Cardiovascular: Negative for chest pain and leg swelling.  Gastrointestinal: Negative for abdominal pain, diarrhea, constipation and abdominal distention.  Endocrine: Negative.   Genitourinary: Negative for dysuria.  ocassional pain in groin (at site of cardiac cath)  Musculoskeletal: Negative for myalgias and joint swelling.  Skin: no rash Allergic/Immunologic: Negative.   Neurological: Negative for weakness, Positive for light-headedness and negative for numbness.  Psychiatric/Behavioral: Negative for suicidal ideas and dysphoric mood.      Objective: Filed Vitals:   02/10/16 1428  BP: 102/68  Pulse: 61  Temp: 98.3 F (36.8 C)  TempSrc: Oral  Weight: 213 lb 6.4 oz (96.798 kg)  SpO2: 95%      Physical Exam Constitutional: He is oriented to person, place, and time. He appears well-developed and well-nourished.  Cardiovascular: Normal rate, normal heart sounds and intact distal pulses.   No murmur heard. Pulmonary/Chest: Effort normal and breath sounds normal. He has no wheezes. He has no rales. He exhibits no tenderness.  Abdominal: Soft. Bowel sounds are normal. He exhibits no distension and no mass. There is no tenderness.  Musculoskeletal: Normal range of motion. He exhibits no edema   Neurological: He is alert and oriented to person, place, and time.  Skin: Skin is warm and dry.  Psychiatric: He has a normal mood and affect.         Assessment & Plan:  1. Type 2 diabetes mellitus with other specified complication, with long-term current use of insulin (HCC) A1c 7.1 Continue medications - Glucose (CBG) - insulin aspart (NOVOLOG FLEXPEN) 100 UNIT/ML FlexPen; Inject 5-10 Units into the skin 2 (two) times daily as needed for high blood sugar (CBG >140). Dose is based on size of meal  Dispense: 15 mL; Refill: 3 - Insulin Glargine (LANTUS SOLOSTAR) 100 UNIT/ML Solostar Pen; Inject 20 Units into the skin daily at 10 pm.  Dispense: 5 pen; Refill: 3  2. Essential hypertension Blood pressure is on the soft side in the patient complains of dizziness Reduced dose of carvedilol from 6.25 mg to 3.125 milligrams twice daily  3. Atrial flutter with rapid ventricular response (HCC) Status post atrial flutter ablation in DCCV Keep appointment with electrophysiology cardiologist.  4. Hyperlipidemia with target LDL less than 100  rosuvastatin (CRESTOR) 20 MG tablet; Take 1 tablet (20 mg total) by mouth daily.  Dispense: 30 tablet; Refill: 3  5. S/P ICD (internal cardiac defibrillator) procedure

## 2016-02-10 NOTE — Patient Instructions (Signed)
Diabetes Mellitus and Food It is important for you to manage your blood sugar (glucose) level. Your blood glucose level can be greatly affected by what you eat. Eating healthier foods in the appropriate amounts throughout the day at about the same time each day will help you control your blood glucose level. It can also help slow or prevent worsening of your diabetes mellitus. Healthy eating may even help you improve the level of your blood pressure and reach or maintain a healthy weight.  General recommendations for healthful eating and cooking habits include:  Eating meals and snacks regularly. Avoid going long periods of time without eating to lose weight.  Eating a diet that consists mainly of plant-based foods, such as fruits, vegetables, nuts, legumes, and whole grains.  Using low-heat cooking methods, such as baking, instead of high-heat cooking methods, such as deep frying. Work with your dietitian to make sure you understand how to use the Nutrition Facts information on food labels. HOW CAN FOOD AFFECT ME? Carbohydrates Carbohydrates affect your blood glucose level more than any other type of food. Your dietitian will help you determine how many carbohydrates to eat at each meal and teach you how to count carbohydrates. Counting carbohydrates is important to keep your blood glucose at a healthy level, especially if you are using insulin or taking certain medicines for diabetes mellitus. Alcohol Alcohol can cause sudden decreases in blood glucose (hypoglycemia), especially if you use insulin or take certain medicines for diabetes mellitus. Hypoglycemia can be a life-threatening condition. Symptoms of hypoglycemia (sleepiness, dizziness, and disorientation) are similar to symptoms of having too much alcohol.  If your health care provider has given you approval to drink alcohol, do so in moderation and use the following guidelines:  Women should not have more than one drink per day, and men  should not have more than two drinks per day. One drink is equal to:  12 oz of beer.  5 oz of wine.  1 oz of hard liquor.  Do not drink on an empty stomach.  Keep yourself hydrated. Have water, diet soda, or unsweetened iced tea.  Regular soda, juice, and other mixers might contain a lot of carbohydrates and should be counted. WHAT FOODS ARE NOT RECOMMENDED? As you make food choices, it is important to remember that all foods are not the same. Some foods have fewer nutrients per serving than other foods, even though they might have the same number of calories or carbohydrates. It is difficult to get your body what it needs when you eat foods with fewer nutrients. Examples of foods that you should avoid that are high in calories and carbohydrates but low in nutrients include:  Trans fats (most processed foods list trans fats on the Nutrition Facts label).  Regular soda.  Juice.  Candy.  Sweets, such as cake, pie, doughnuts, and cookies.  Fried foods. WHAT FOODS CAN I EAT? Eat nutrient-rich foods, which will nourish your body and keep you healthy. The food you should eat also will depend on several factors, including:  The calories you need.  The medicines you take.  Your weight.  Your blood glucose level.  Your blood pressure level.  Your cholesterol level. You should eat a variety of foods, including:  Protein.  Lean cuts of meat.  Proteins low in saturated fats, such as fish, egg whites, and beans. Avoid processed meats.  Fruits and vegetables.  Fruits and vegetables that may help control blood glucose levels, such as apples, mangoes, and   yams.  Dairy products.  Choose fat-free or low-fat dairy products, such as milk, yogurt, and cheese.  Grains, bread, pasta, and rice.  Choose whole grain products, such as multigrain bread, whole oats, and brown rice. These foods may help control blood pressure.  Fats.  Foods containing healthful fats, such as nuts,  avocado, olive oil, canola oil, and fish. DOES EVERYONE WITH DIABETES MELLITUS HAVE THE SAME MEAL PLAN? Because every person with diabetes mellitus is different, there is not one meal plan that works for everyone. It is very important that you meet with a dietitian who will help you create a meal plan that is just right for you.   This information is not intended to replace advice given to you by your health care provider. Make sure you discuss any questions you have with your health care provider.   Document Released: 04/15/2005 Document Revised: 08/09/2014 Document Reviewed: 06/15/2013 Elsevier Interactive Patient Education 2016 Elsevier Inc.  

## 2016-02-11 ENCOUNTER — Ambulatory Visit (HOSPITAL_COMMUNITY): Payer: Medicaid Other | Attending: Cardiology

## 2016-02-11 ENCOUNTER — Telehealth: Payer: Self-pay

## 2016-02-11 ENCOUNTER — Other Ambulatory Visit (HOSPITAL_COMMUNITY): Payer: Self-pay | Admitting: *Deleted

## 2016-02-11 ENCOUNTER — Ambulatory Visit (INDEPENDENT_AMBULATORY_CARE_PROVIDER_SITE_OTHER): Payer: Medicaid Other | Admitting: *Deleted

## 2016-02-11 ENCOUNTER — Other Ambulatory Visit: Payer: Self-pay

## 2016-02-11 DIAGNOSIS — I340 Nonrheumatic mitral (valve) insufficiency: Secondary | ICD-10-CM | POA: Insufficient documentation

## 2016-02-11 DIAGNOSIS — I509 Heart failure, unspecified: Secondary | ICD-10-CM | POA: Insufficient documentation

## 2016-02-11 DIAGNOSIS — I255 Ischemic cardiomyopathy: Secondary | ICD-10-CM | POA: Diagnosis not present

## 2016-02-11 DIAGNOSIS — E785 Hyperlipidemia, unspecified: Secondary | ICD-10-CM | POA: Insufficient documentation

## 2016-02-11 DIAGNOSIS — I110 Hypertensive heart disease with heart failure: Secondary | ICD-10-CM | POA: Insufficient documentation

## 2016-02-11 DIAGNOSIS — E119 Type 2 diabetes mellitus without complications: Secondary | ICD-10-CM | POA: Diagnosis not present

## 2016-02-11 DIAGNOSIS — I4892 Unspecified atrial flutter: Secondary | ICD-10-CM | POA: Diagnosis not present

## 2016-02-11 LAB — ECHOCARDIOGRAM COMPLETE
CHL CUP DOP CALC LVOT VTI: 14.4 cm
CHL CUP MV DEC (S): 158
E/e' ratio: 18.35
EWDT: 158 ms
FS: 8 % — AB (ref 28–44)
IVS/LV PW RATIO, ED: 1.13
LA ID, A-P, ES: 71 mm
LA diam end sys: 71 mm
LA vol: 125 mL
LADIAMINDEX: 3.27 cm/m2
LAVOLA4C: 111 mL
LAVOLIN: 57.5 mL/m2
LDCA: 3.8 cm2
LV E/e' medial: 18.35
LV E/e'average: 18.35
LV PW d: 9.7 mm — AB (ref 0.6–1.1)
LV TDI E'LATERAL: 6.32
LV TDI E'MEDIAL: 3.44
LVELAT: 6.32 cm/s
LVOT SV: 55 mL
LVOT diameter: 22 mm
LVOTPV: 72.6 cm/s
MV Peak grad: 5 mmHg
MVPKAVEL: 19.5 m/s
MVPKEVEL: 116 m/s

## 2016-02-11 LAB — POCT INR: INR: 1.7

## 2016-02-11 MED ORDER — PERFLUTREN LIPID MICROSPHERE
1.0000 mL | INTRAVENOUS | Status: AC | PRN
  Administered 2016-02-11: 1 mL via INTRAVENOUS

## 2016-02-11 NOTE — Telephone Encounter (Signed)
Call received from the patient and informed him that his medicaid is active as of 01/31/16. If he has questions about bills prior to that date, he needs to call [employer] and he stated that he understood.

## 2016-02-11 NOTE — Telephone Encounter (Signed)
As per Fay Records, [facility] Financial Counselor, the patient has been approved for medicaid effective 01/31/2016. ID # [id number].  Call placed to the patient # [phone] to inform him of the approval. HIPAA compliant voicemail message left requesting a call back to # [phone] or [phone].

## 2016-02-16 ENCOUNTER — Ambulatory Visit (HOSPITAL_COMMUNITY): Payer: Self-pay

## 2016-02-16 ENCOUNTER — Telehealth: Payer: Self-pay

## 2016-02-16 NOTE — Telephone Encounter (Signed)
Call placed to Myer Peer, [facility] Liaison, to inquire if the patient is eligible for their tele-monitoring services in the community.  She stated that she is not able to determine his eligibility as he was just approved for medicaid last week and the system that verifies eligibility for her is not currently updated.She will continue to check his status regarding eligibility.

## 2016-02-17 ENCOUNTER — Encounter: Payer: Self-pay | Admitting: Internal Medicine

## 2016-02-17 ENCOUNTER — Telehealth: Payer: Self-pay

## 2016-02-17 NOTE — Telephone Encounter (Signed)
Message received from Myer Peer, [facility] Liaison, noting that she spoke to the patient's DSS careworker, Dow Adolph and was informed that the patient has straight medicaid, not [place] Access 2, so he is not eligible for [facility] services/tele-monitoring.

## 2016-02-19 ENCOUNTER — Telehealth: Payer: Self-pay | Admitting: Family Medicine

## 2016-02-19 ENCOUNTER — Telehealth: Payer: Self-pay

## 2016-02-19 ENCOUNTER — Other Ambulatory Visit: Payer: Self-pay | Admitting: Family Medicine

## 2016-02-19 DIAGNOSIS — E119 Type 2 diabetes mellitus without complications: Secondary | ICD-10-CM

## 2016-02-19 DIAGNOSIS — Z794 Long term (current) use of insulin: Principal | ICD-10-CM

## 2016-02-19 MED ORDER — GLUCOSE BLOOD VI STRP: ORAL_STRIP | Status: AC

## 2016-02-19 MED ORDER — ACCU-CHEK SOFTCLIX LANCET DEV MISC: Status: AC

## 2016-02-19 MED ORDER — CLOPIDOGREL BISULFATE 75 MG PO TABS: 75.0000 mg | ORAL_TABLET | Freq: Every day | ORAL | Status: AC

## 2016-02-19 MED ORDER — ACCU-CHEK AVIVA CONNECT W/DEVICE KIT: 1.0000 | PACK | Freq: Three times a day (TID) | Status: AC

## 2016-02-19 MED ORDER — POTASSIUM CHLORIDE 20 MEQ PO PACK: 20.0000 meq | PACK | ORAL | Status: AC

## 2016-02-19 MED ORDER — DIGOXIN 125 MCG PO TABS: 0.1250 mg | ORAL_TABLET | Freq: Every day | ORAL | Status: AC

## 2016-02-19 MED FILL — WARFARIN NA 2.5 MG TAB: 2.5 | 30 days supply | Qty: 30 | Fill #2

## 2016-02-19 MED FILL — DIGITEK 125 MCG TABLET: 125 | 30 days supply | Qty: 30 | Fill #0

## 2016-02-19 MED FILL — ACCU-CHEK SOFTCLIX LANCETS: 30 days supply | Qty: 100 | Fill #0

## 2016-02-19 MED FILL — ACCU-CHEK AVIVA PLUS TEST S: 30 days supply | Qty: 100 | Fill #0

## 2016-02-19 MED FILL — CLOPIDOGREL 75 MG TABLET: 75 | 30 days supply | Qty: 30 | Fill #3

## 2016-02-19 MED FILL — POTASSIUM CL ER 20 MEQ TAB: 20 | 30 days supply | Qty: 30 | Fill #1

## 2016-02-19 NOTE — Telephone Encounter (Signed)
This Case Manager placed call to patient to check on status. Patient indicated he was doing well and denied chest pain or shortness of breath. Patient indicated he needed refills of the following medications: digoxin 0.125 mg daily, plavix 75 mg daily, potassium chloride 20 mEq every other day, and coumadin 1.25 mg daily.  Patient instructed that Coumadin refill ready for pick-up in the pharmacy. Dr. Venetia Night ordered refills of digoxin, plavix, and potassium chloride. Patient appreciative and also indicated he needed a refill of diabetes test strips. He indicated he had the True Metrix meter. Spoke with Pharmacy tech who indicated patient will need Accuc[employer]k Aviva meter and diabetes testing supplies since he now has Medicaid. Dr. Venetia Night gave verbal order for [employer]uc[employer] Aviva meter and test strips, and meter and testing supplies ordered. Patient updated on all above information, and he verbalized understanding.    In addition, discussed importance of medication compliance, and patient verbalized understanding. He indicated he was now taking reduced dose of carvedilol (3.125 mg twice daily) that Dr. Venetia Night prescribed on 02/10/16. Also reminded patient of importance of checking weight daily and keeping a weight log. Patient verbalized understanding. Reminded patient that Dr. Venetia Night wants him to follow-up with Dr. Hyman Hopes around 03/23/16 for follow-up of hypertension. Informed patient he will need to call clinic in early August to schedule follow-up appointment. Patient verbalized understanding and appreciative of call. No additional needs/concerns identified.

## 2016-02-23 ENCOUNTER — Telehealth: Payer: Self-pay | Admitting: *Deleted

## 2016-02-23 MED FILL — NOVOLOG FLEXPEN SYRINGE: 100 | 30 days supply | Qty: 6 | Fill #1

## 2016-02-23 MED FILL — ACCU-CHEK AVIVA PLUS METER: W/DEVICE | 1 days supply | Qty: 1 | Fill #0

## 2016-02-23 MED FILL — ROSUVASTATIN CAL 20 MG TAB: 20 | 30 days supply | Qty: 30 | Fill #1

## 2016-02-23 MED FILL — LANTUS SOLOSTAR 100 UNITS/M: 100 | 30 days supply | Qty: 6 | Fill #1

## 2016-02-23 MED FILL — FUROSEMIDE 20 MG TABLET: 20 | 30 days supply | Qty: 60 | Fill #2

## 2016-02-23 NOTE — Telephone Encounter (Signed)
LMTCB/sss  ICD shock received on 02/22/16. Called patient to see if he had sx's

## 2016-02-24 NOTE — Telephone Encounter (Signed)
Spoke to patient regarding ICD shock from 7/23. Patient states that he was having "activity with his wife" when all of a sudden he became dizzy and received the ICD shock. Patient states that the shock relieved his sx's. Patient states that he is still taking all of his medications as Rx'd.   Will inform Dr.Taylor about episode and notify patient of any further recommendations.  I encouraged patient to keep his appt with Dr.Taylor scheduled for 7/28. Patient aware of driving restrictions.

## 2016-02-26 MED FILL — ENTRESTO 24 MG-26 MG TABLET: 24-26 | 30 days supply | Qty: 60 | Fill #1

## 2016-02-27 ENCOUNTER — Ambulatory Visit (INDEPENDENT_AMBULATORY_CARE_PROVIDER_SITE_OTHER): Payer: Medicaid Other | Admitting: Internal Medicine

## 2016-02-27 ENCOUNTER — Ambulatory Visit (INDEPENDENT_AMBULATORY_CARE_PROVIDER_SITE_OTHER): Payer: Medicaid Other | Admitting: *Deleted

## 2016-02-27 ENCOUNTER — Telehealth (HOSPITAL_COMMUNITY): Payer: Self-pay | Admitting: Pharmacist

## 2016-02-27 ENCOUNTER — Encounter: Payer: Self-pay | Admitting: Internal Medicine

## 2016-02-27 VITALS — BP 134/68 | HR 66 | Ht 67.0 in | Wt 211.0 lb

## 2016-02-27 DIAGNOSIS — I255 Ischemic cardiomyopathy: Secondary | ICD-10-CM | POA: Diagnosis not present

## 2016-02-27 DIAGNOSIS — I4892 Unspecified atrial flutter: Secondary | ICD-10-CM | POA: Diagnosis not present

## 2016-02-27 DIAGNOSIS — I5022 Chronic systolic (congestive) heart failure: Secondary | ICD-10-CM | POA: Diagnosis not present

## 2016-02-27 DIAGNOSIS — I5023 Acute on chronic systolic (congestive) heart failure: Secondary | ICD-10-CM | POA: Diagnosis not present

## 2016-02-27 LAB — CUP PACEART INCLINIC DEVICE CHECK
Brady Statistic RV Percent Paced: 57 %
Date Time Interrogation Session: 20170728183113
HighPow Impedance: 61.875
Implantable Lead Implant Date: 20161007
Implantable Lead Location: 753858
Lead Channel Impedance Value: 437.5 Ohm
Lead Channel Impedance Value: 825 Ohm
Lead Channel Pacing Threshold Amplitude: 1 V
Lead Channel Pacing Threshold Amplitude: 1.75 V
Lead Channel Pacing Threshold Pulse Width: 0.5 ms
Lead Channel Sensing Intrinsic Amplitude: 4.6 mV
Lead Channel Setting Pacing Amplitude: 2 V
Lead Channel Setting Pacing Amplitude: 2.5 V
Lead Channel Setting Pacing Pulse Width: 0.5 ms
MDC IDC LEAD IMPLANT DT: 20161007
MDC IDC LEAD IMPLANT DT: 20161007
MDC IDC LEAD LOCATION: 753859
MDC IDC LEAD LOCATION: 753860
MDC IDC LEAD MODEL: 7122
MDC IDC MSMT BATTERY REMAINING LONGEVITY: 75.6
MDC IDC MSMT LEADCHNL LV PACING THRESHOLD AMPLITUDE: 1.75 V
MDC IDC MSMT LEADCHNL LV PACING THRESHOLD PULSEWIDTH: 0.5 ms
MDC IDC MSMT LEADCHNL RA PACING THRESHOLD AMPLITUDE: 0.75 V
MDC IDC MSMT LEADCHNL RA PACING THRESHOLD AMPLITUDE: 0.75 V
MDC IDC MSMT LEADCHNL RA PACING THRESHOLD PULSEWIDTH: 0.5 ms
MDC IDC MSMT LEADCHNL RA PACING THRESHOLD PULSEWIDTH: 0.5 ms
MDC IDC MSMT LEADCHNL RV IMPEDANCE VALUE: 487.5 Ohm
MDC IDC MSMT LEADCHNL RV PACING THRESHOLD AMPLITUDE: 1 V
MDC IDC MSMT LEADCHNL RV PACING THRESHOLD PULSEWIDTH: 0.5 ms
MDC IDC MSMT LEADCHNL RV PACING THRESHOLD PULSEWIDTH: 0.5 ms
MDC IDC MSMT LEADCHNL RV SENSING INTR AMPL: 12 mV
MDC IDC PG SERIAL: 7294919
MDC IDC SET LEADCHNL RA PACING AMPLITUDE: 2 V
MDC IDC SET LEADCHNL RV PACING PULSEWIDTH: 0.5 ms
MDC IDC SET LEADCHNL RV SENSING SENSITIVITY: 0.5 mV
MDC IDC STAT BRADY RA PERCENT PACED: 16 %

## 2016-02-27 LAB — BASIC METABOLIC PANEL
BUN: 17 mg/dL (ref 7–25)
CHLORIDE: 101 mmol/L (ref 98–110)
CO2: 26 mmol/L (ref 20–31)
Calcium: 9.4 mg/dL (ref 8.6–10.3)
Creat: 1.05 mg/dL (ref 0.70–1.33)
GLUCOSE: 96 mg/dL (ref 65–99)
POTASSIUM: 4.5 mmol/L (ref 3.5–5.3)
SODIUM: 138 mmol/L (ref 135–146)

## 2016-02-27 LAB — POCT INR: INR: 1.5

## 2016-02-27 MED ORDER — AMIODARONE HCL 200 MG PO TABS: 200.0000 mg | ORAL_TABLET | Freq: Two times a day (BID) | ORAL | 3 refills | Status: AC

## 2016-02-27 NOTE — Telephone Encounter (Signed)
Entresto 24-26 mg PO BID PA approved by [place] Medicaid through 02/05/17.   Tyler Deis. Bonnye Fava, PharmD, BCPS, CPP Clinical Pharmacist Pager: [phone] Phone: [phone] 02/27/2016 9:43 AM

## 2016-02-27 NOTE — Patient Instructions (Addendum)
Medication Instructions:  Your physician has recommended you make the following change in your medication:  1) Take Amiodarone 200 mg twice daily for 2 weeks then go back to one daily    Labwork: Your physician recommends that you return for lab work BMP    Testing/Procedures: None ordered   Follow-Up: Your physician wants you to follow-up in: 9 months with Benjamin [name] will receive a reminder letter in the mail two months in advance. If you don't receive a letter, please call our office to schedule the follow-up appointment.  Remote monitoring is used to monitor your ICD from home. This monitoring reduces the number of office visits required to check your device to one time per year. It allows [place] to keep an eye on the functioning of your device to ensure it is working properly. You are scheduled for a device check from home on 05/31/16. You may send your transmission at any time that day. If you have a wireless device, the transmission will be sent automatically. After your physician reviews your transmission, you will receive a postcard with your next transmission date.     Any Other Special Instructions Will Be Listed Below (If Applicable).     If you need a refill on your cardiac medications before your next appointment, please call your pharmacy.

## 2016-02-27 NOTE — Progress Notes (Signed)
HPI Benjamin [name] returns for followup. He is a pleasant 55 yo man with chronic systolic heart failure and LBBB, s/p BiV ICD implant. He was found to have 3 vessel CAD and has undergone complex intervention by Dr. Tamala Julian. He developed atrial flutter, and underwent EP study and ablation but was found to have multiple flutters. He has been shocked by his ICD in the interim for what appears to be 1:1 flutter. Although we cannot definitely exclude a double tachycardia. He has been on amiodarone. His CHF symptoms are unchanged. He remains class 2B/3A.  Allergies  Allergen Reactions  . Heparin Other (See Comments)    Will not use pork-derived products due to religious beliefs   . Lovenox [Enoxaparin Sodium] Other (See Comments)    Will not use pork-derived products due to religious beliefs   . Pork-Derived Products Other (See Comments)    Does not eat pork or pork derived products d/t religious reasons     Current Outpatient Prescriptions  Medication Sig Dispense Refill  . albuterol (PROVENTIL HFA;VENTOLIN HFA) 108 (90 Base) MCG/ACT inhaler Inhale 2 puffs into the lungs every 6 (six) hours as needed for wheezing or shortness of breath. 1 Inhaler 0  . amiodarone (PACERONE) 200 MG tablet Take 1 tablet (200 mg total) by mouth daily. 60 tablet 5  . Blood Glucose Monitoring Suppl (ACCU-CHEK AVIVA CONNECT) w/Device KIT 1 Device by Does not apply route 3 (three) times daily. 1 kit 0  . carvedilol (COREG) 3.125 MG tablet Take 1 tablet (3.125 mg total) by mouth 2 (two) times daily with a meal. 60 tablet 2  . clopidogrel (PLAVIX) 75 MG tablet Take 1 tablet (75 mg total) by mouth daily. 30 tablet 3  . digoxin (LANOXIN) 0.125 MG tablet Take 1 tablet (0.125 mg total) by mouth daily. 30 tablet 3  . furosemide (LASIX) 20 MG tablet Take 40 mg by mouth daily.  3  . glucose blood (ACCU-CHEK AVIVA) test strip Check Blood glucose three times daily. 100 each 12  . insulin aspart (NOVOLOG FLEXPEN) 100 UNIT/ML FlexPen  Inject 5-10 Units into the skin 2 (two) times daily as needed for high blood sugar (CBG >140). Dose is based on size of meal 15 mL 3  . Insulin Glargine (LANTUS SOLOSTAR) 100 UNIT/ML Solostar Pen Inject 20 Units into the skin daily at 10 pm. 5 pen 3  . Lancet Devices (ACCU-CHEK SOFTCLIX) lancets Check blood glucose three times daily. 1 each 12  . metFORMIN (GLUCOPHAGE) 1000 MG tablet Take 1,000 mg by mouth daily with lunch.     . nitroGLYCERIN (NITROSTAT) 0.4 MG SL tablet Place 1 tablet (0.4 mg total) under the tongue every 5 (five) minutes x 3 doses as needed for chest pain. 25 tablet 3  . podofilox (CONDYLOX) 0.5 % external solution Apply topically 2 (two) times daily. For 3 days then off for 4 days; repeat every week for 4 weeks. 3.5 mL 1  . potassium chloride (KLOR-CON) 20 MEQ packet Take 20 mEq by mouth every other day. 30 tablet 3  . pregabalin (LYRICA) 75 MG capsule Take 150 mg by mouth at bedtime.    . rosuvastatin (CRESTOR) 20 MG tablet Take 1 tablet (20 mg total) by mouth daily. 30 tablet 3  . sacubitril-valsartan (ENTRESTO) 24-26 MG Take 1 tablet by mouth 2 (two) times daily. 60 tablet 3  . traMADol (ULTRAM) 50 MG tablet Take 1 tablet (50 mg total) by mouth every 6 (six) hours as needed. (  pain) 30 tablet 0  . warfarin (COUMADIN) 2.5 MG tablet Take 1.25 mg by mouth daily. 1/2 tablet every day  5   No current facility-administered medications for this visit.      Past Medical History:  Diagnosis Date  . AICD (automatic cardioverter/defibrillator) present   . Cholelithiasis   . Chronic systolic heart failure (HCC)   . Coronary artery disease    distal vessel disease  . GERD (gastroesophageal reflux disease)   . High cholesterol   . Hypertension   . Kidney stone   . NSTEMI (non-ST elevated myocardial infarction) (HCC)   . Pneumonia 2002  . Type II diabetes mellitus (HCC)     ROS:   All systems reviewed and negative except as noted in the HPI.   Past Surgical History:    Procedure Laterality Date  . CARDIAC CATHETERIZATION N/A 10/07/2015   Procedure: Left Heart Cath and Coronary Angiography;  Surgeon: Lyn Records, MD;  Location: [facility] INVASIVE CV LAB;  Service: Cardiovascular;  Laterality: N/A;  . CARDIAC CATHETERIZATION N/A 11/05/2015   Procedure: Coronary Stent Intervention Rotablater;  Surgeon: Lyn Records, MD;  Location: [facility] INVASIVE CV LAB;  Service: Cardiovascular;  Laterality: N/A;  . CHOLECYSTECTOMY  10/09/2015   Procedure: LAPAROSCOPIC CHOLECYSTECTOMY;  Surgeon: Claud Kelp, MD;  Location: [facility] OR;  Service: General;;  . COLONOSCOPY N/A 07/12/2013   Procedure: COLONOSCOPY;  Surgeon: Shirley Friar, MD;  Location: [facility] ENDOSCOPY;  Service: Endoscopy;  Laterality: N/A;  . ELECTROPHYSIOLOGIC STUDY N/A 01/23/2016   Procedure: A-Flutter/A-Tach/SVT Ablation;  Surgeon: Marinus Maw, MD;  Location: [facility] INVASIVE CV LAB;  Service: Cardiovascular;  Laterality: N/A;  . EP IMPLANTABLE DEVICE N/A 05/09/2015   Procedure: BiV ICD Insertion CRT-D;  Surgeon: Marinus Maw, MD;  Location: [facility] INVASIVE CV LAB;  Service: Cardiovascular;  Laterality: N/A;     Family History  Problem Relation Age of Onset  . Diabetes Mother   . Hypertension Mother   . Hyperlipidemia Mother   . Stroke Mother   . Diabetes Father   . Hypertension Father   . Stroke Father 67     Social History   Social History  . Marital status: Married    Spouse name: N/A  . Number of children: N/A  . Years of education: N/A   Occupational History  . Not on file.   Social History Main Topics  . Smoking status: Never Smoker  . Smokeless tobacco: Never Used  . Alcohol use No  . Drug use: No  . Sexual activity: No   Other Topics Concern  . Not on file   Social History Narrative  . No narrative on file     BP 134/68   Pulse 66   Ht 5\' 7"  (1.702 m)   Wt 211 lb (95.7 kg)   SpO2 95%   BMI 33.05 kg/m   Physical Exam:  Well appearing 55 yo man, obese, NAD HEENT:  Unremarkable Neck:  6 cm JVD, no thyromegally Lymphatics:  No adenopathy Back:  No CVA tenderness Lungs:  Clear with no wheezes HEART:  Regular rate rhythm, no murmurs, no rubs, no clicks Abd:  soft, positive bowel sounds, no organomegally, no rebound, no guarding Ext:  2 plus pulses, no edema, no cyanosis, no clubbing Skin:  No rashes no nodules Neuro:  CN II through XII intact, motor grossly intact  EKG - nsr with LBBB/IVCD ICD check - normal device (St.Jude BiV ICD) function.  Assess/Plan: 1. CAD - he is  improved after complex PCI.  2. Chronic systolic heart failure - his symptoms are class 2-3.  His 2D echo demonstrates no improvement in his LV function. 3. ICD - his [employer]. Jude BiV ICD is working normally. Will recheck in several months 4. Obesity - again we discussed the importance of weight loss. He has lost about 10 lbs. 5. Atrial flutter - he had at least 2 circuits at ablation. I have asked him to continue amio but to increase this medication to 400 mg a day for 2 weeks.  Mikle Bosworth.D.

## 2016-03-02 ENCOUNTER — Encounter: Payer: Self-pay | Admitting: Internal Medicine

## 2016-03-10 ENCOUNTER — Ambulatory Visit (INDEPENDENT_AMBULATORY_CARE_PROVIDER_SITE_OTHER): Payer: Medicaid Other | Admitting: *Deleted

## 2016-03-10 DIAGNOSIS — I4892 Unspecified atrial flutter: Secondary | ICD-10-CM | POA: Diagnosis not present

## 2016-03-10 LAB — POCT INR: INR: 2

## 2016-03-10 MED FILL — AMIODARONE HCL 200 MG TAB: 200 | 30 days supply | Qty: 60 | Fill #2

## 2016-03-10 MED FILL — CARVEDILOL 3.125 MG TABLET: 3.125 | 30 days supply | Qty: 60 | Fill #1

## 2016-03-12 NOTE — Telephone Encounter (Signed)
Pt came in requesting a refill of pregabalin (LYRICA) 75 MG capsule  Can this be prescribed to pt or does pt need OV?   Pt was informed that he would be called the week of 8/14 with whether he needs an OV or not

## 2016-03-15 ENCOUNTER — Telehealth: Payer: Self-pay

## 2016-03-15 MED ORDER — PREGABALIN 75 MG PO CAPS: 150.0000 mg | ORAL_CAPSULE | Freq: Every day | ORAL | 3 refills | Status: AC

## 2016-03-15 NOTE — Telephone Encounter (Signed)
Ready for pick up

## 2016-03-15 NOTE — Telephone Encounter (Signed)
Writer LVM for patient to let him know that his Lyrica prescription is ready for pick up.

## 2016-03-19 ENCOUNTER — Inpatient Hospital Stay (HOSPITAL_COMMUNITY): Payer: Medicaid Other

## 2016-03-19 ENCOUNTER — Ambulatory Visit (HOSPITAL_COMMUNITY): Payer: Medicaid Other

## 2016-03-19 ENCOUNTER — Inpatient Hospital Stay (HOSPITAL_COMMUNITY): Admission: EM | Disposition: E | Payer: Self-pay | Attending: Pulmonary Disease

## 2016-03-19 ENCOUNTER — Encounter (HOSPITAL_COMMUNITY): Payer: Self-pay | Admitting: Emergency Medicine

## 2016-03-19 ENCOUNTER — Inpatient Hospital Stay (HOSPITAL_COMMUNITY)
Admission: EM | Admit: 2016-03-19 | Discharge: 2016-04-02 | DRG: 286 | Disposition: E | Payer: Medicaid Other | Source: Intra-hospital | Attending: Pulmonary Disease | Admitting: Pulmonary Disease

## 2016-03-19 ENCOUNTER — Encounter (HOSPITAL_COMMUNITY): Payer: Self-pay | Admitting: Internal Medicine

## 2016-03-19 DIAGNOSIS — I110 Hypertensive heart disease with heart failure: Secondary | ICD-10-CM | POA: Diagnosis present

## 2016-03-19 DIAGNOSIS — R402212 Coma scale, best verbal response, none, at arrival to emergency department: Secondary | ICD-10-CM | POA: Diagnosis present

## 2016-03-19 DIAGNOSIS — Z888 Allergy status to other drugs, medicaments and biological substances status: Secondary | ICD-10-CM

## 2016-03-19 DIAGNOSIS — Z452 Encounter for adjustment and management of vascular access device: Secondary | ICD-10-CM | POA: Diagnosis not present

## 2016-03-19 DIAGNOSIS — R402312 Coma scale, best motor response, none, at arrival to emergency department: Secondary | ICD-10-CM | POA: Diagnosis present

## 2016-03-19 DIAGNOSIS — Z9861 Coronary angioplasty status: Secondary | ICD-10-CM

## 2016-03-19 DIAGNOSIS — R570 Cardiogenic shock: Secondary | ICD-10-CM | POA: Diagnosis present

## 2016-03-19 DIAGNOSIS — D689 Coagulation defect, unspecified: Secondary | ICD-10-CM | POA: Diagnosis present

## 2016-03-19 DIAGNOSIS — Z7984 Long term (current) use of oral hypoglycemic drugs: Secondary | ICD-10-CM

## 2016-03-19 DIAGNOSIS — I2510 Atherosclerotic heart disease of native coronary artery without angina pectoris: Secondary | ICD-10-CM | POA: Diagnosis not present

## 2016-03-19 DIAGNOSIS — I469 Cardiac arrest, cause unspecified: Secondary | ICD-10-CM | POA: Diagnosis not present

## 2016-03-19 DIAGNOSIS — J9601 Acute respiratory failure with hypoxia: Secondary | ICD-10-CM | POA: Diagnosis present

## 2016-03-19 DIAGNOSIS — R4020 Unspecified coma: Secondary | ICD-10-CM | POA: Diagnosis not present

## 2016-03-19 DIAGNOSIS — Z789 Other specified health status: Secondary | ICD-10-CM | POA: Diagnosis not present

## 2016-03-19 DIAGNOSIS — J449 Chronic obstructive pulmonary disease, unspecified: Secondary | ICD-10-CM | POA: Diagnosis present

## 2016-03-19 DIAGNOSIS — Z833 Family history of diabetes mellitus: Secondary | ICD-10-CM | POA: Diagnosis not present

## 2016-03-19 DIAGNOSIS — J9600 Acute respiratory failure, unspecified whether with hypoxia or hypercapnia: Secondary | ICD-10-CM

## 2016-03-19 DIAGNOSIS — Z7901 Long term (current) use of anticoagulants: Secondary | ICD-10-CM

## 2016-03-19 DIAGNOSIS — Z794 Long term (current) use of insulin: Secondary | ICD-10-CM

## 2016-03-19 DIAGNOSIS — G253 Myoclonus: Secondary | ICD-10-CM | POA: Diagnosis not present

## 2016-03-19 DIAGNOSIS — I252 Old myocardial infarction: Secondary | ICD-10-CM | POA: Diagnosis not present

## 2016-03-19 DIAGNOSIS — Z823 Family history of stroke: Secondary | ICD-10-CM | POA: Diagnosis not present

## 2016-03-19 DIAGNOSIS — I5022 Chronic systolic (congestive) heart failure: Secondary | ICD-10-CM | POA: Diagnosis present

## 2016-03-19 DIAGNOSIS — E785 Hyperlipidemia, unspecified: Secondary | ICD-10-CM | POA: Diagnosis present

## 2016-03-19 DIAGNOSIS — Z8249 Family history of ischemic heart disease and other diseases of the circulatory system: Secondary | ICD-10-CM | POA: Diagnosis not present

## 2016-03-19 DIAGNOSIS — Z9289 Personal history of other medical treatment: Secondary | ICD-10-CM

## 2016-03-19 DIAGNOSIS — I4892 Unspecified atrial flutter: Secondary | ICD-10-CM | POA: Diagnosis present

## 2016-03-19 DIAGNOSIS — Z9049 Acquired absence of other specified parts of digestive tract: Secondary | ICD-10-CM

## 2016-03-19 DIAGNOSIS — R402112 Coma scale, eyes open, never, at arrival to emergency department: Secondary | ICD-10-CM | POA: Diagnosis present

## 2016-03-19 DIAGNOSIS — J690 Pneumonitis due to inhalation of food and vomit: Secondary | ICD-10-CM | POA: Diagnosis present

## 2016-03-19 DIAGNOSIS — Z955 Presence of coronary angioplasty implant and graft: Secondary | ICD-10-CM

## 2016-03-19 DIAGNOSIS — J984 Other disorders of lung: Secondary | ICD-10-CM | POA: Diagnosis present

## 2016-03-19 DIAGNOSIS — G931 Anoxic brain damage, not elsewhere classified: Secondary | ICD-10-CM | POA: Diagnosis present

## 2016-03-19 DIAGNOSIS — K219 Gastro-esophageal reflux disease without esophagitis: Secondary | ICD-10-CM | POA: Diagnosis present

## 2016-03-19 DIAGNOSIS — I255 Ischemic cardiomyopathy: Secondary | ICD-10-CM | POA: Diagnosis present

## 2016-03-19 DIAGNOSIS — G9340 Encephalopathy, unspecified: Secondary | ICD-10-CM

## 2016-03-19 DIAGNOSIS — Z95810 Presence of automatic (implantable) cardiac defibrillator: Secondary | ICD-10-CM | POA: Diagnosis not present

## 2016-03-19 DIAGNOSIS — N179 Acute kidney failure, unspecified: Secondary | ICD-10-CM | POA: Diagnosis present

## 2016-03-19 DIAGNOSIS — E872 Acidosis: Secondary | ICD-10-CM | POA: Diagnosis present

## 2016-03-19 DIAGNOSIS — I4901 Ventricular fibrillation: Principal | ICD-10-CM | POA: Diagnosis present

## 2016-03-19 DIAGNOSIS — R569 Unspecified convulsions: Secondary | ICD-10-CM | POA: Diagnosis not present

## 2016-03-19 DIAGNOSIS — D72829 Elevated white blood cell count, unspecified: Secondary | ICD-10-CM | POA: Diagnosis present

## 2016-03-19 DIAGNOSIS — G40901 Epilepsy, unspecified, not intractable, with status epilepticus: Secondary | ICD-10-CM | POA: Diagnosis not present

## 2016-03-19 DIAGNOSIS — Z6833 Body mass index (BMI) 33.0-33.9, adult: Secondary | ICD-10-CM

## 2016-03-19 DIAGNOSIS — Z978 Presence of other specified devices: Secondary | ICD-10-CM

## 2016-03-19 DIAGNOSIS — E1165 Type 2 diabetes mellitus with hyperglycemia: Secondary | ICD-10-CM | POA: Diagnosis present

## 2016-03-19 DIAGNOSIS — I639 Cerebral infarction, unspecified: Secondary | ICD-10-CM | POA: Diagnosis not present

## 2016-03-19 DIAGNOSIS — Z79899 Other long term (current) drug therapy: Secondary | ICD-10-CM

## 2016-03-19 DIAGNOSIS — Z7902 Long term (current) use of antithrombotics/antiplatelets: Secondary | ICD-10-CM

## 2016-03-19 DIAGNOSIS — R740 Nonspecific elevation of levels of transaminase and lactic acid dehydrogenase [LDH]: Secondary | ICD-10-CM | POA: Diagnosis present

## 2016-03-19 DIAGNOSIS — Z66 Do not resuscitate: Secondary | ICD-10-CM | POA: Diagnosis not present

## 2016-03-19 HISTORY — PX: CARDIAC CATHETERIZATION: SHX172

## 2016-03-19 LAB — BASIC METABOLIC PANEL
ANION GAP: 6 (ref 5–15)
Anion gap: 5 (ref 5–15)
BUN: 23 mg/dL — ABNORMAL HIGH (ref 6–20)
BUN: 25 mg/dL — AB (ref 6–20)
CALCIUM: 7.7 mg/dL — AB (ref 8.9–10.3)
CO2: 22 mmol/L (ref 22–32)
CO2: 23 mmol/L (ref 22–32)
CREATININE: 1.52 mg/dL — AB (ref 0.61–1.24)
CREATININE: 1.41 mg/dL — AB (ref 0.61–1.24)
Calcium: 8 mg/dL — ABNORMAL LOW (ref 8.9–10.3)
Chloride: 107 mmol/L (ref 101–111)
Chloride: 108 mmol/L (ref 101–111)
GFR calc Af Amer: 58 mL/min — ABNORMAL LOW (ref >60)
GFR calc Af Amer: >60 mL/min (ref >60)
GFR calc non Af Amer: 50 mL/min — ABNORMAL LOW (ref >60)
GFR, EST NON AFRICAN AMERICAN: 55 mL/min — AB (ref >60)
GLUCOSE: 207 mg/dL — AB (ref 65–99)
Glucose, Bld: 250 mg/dL — ABNORMAL HIGH (ref 65–99)
Potassium: 6 mmol/L — ABNORMAL HIGH (ref 3.5–5.1)
Potassium: 4.3 mmol/L (ref 3.5–5.1)
SODIUM: 136 mmol/L (ref 135–145)
Sodium: 135 mmol/L (ref 135–145)

## 2016-03-19 LAB — POCT I-STAT 3, ART BLOOD GAS (G3+)
ACID-BASE DEFICIT: 1 mmol/L (ref 0.0–2.0)
ACID-BASE DEFICIT: 5 mmol/L — AB (ref 0.0–2.0)
Acid-base deficit: 5 mmol/L — ABNORMAL HIGH (ref 0.0–2.0)
BICARBONATE: 23 meq/L (ref 20.0–24.0)
Bicarbonate: 19.7 mEq/L — ABNORMAL LOW (ref 20.0–24.0)
Bicarbonate: 18.7 mEq/L — ABNORMAL LOW (ref 20.0–24.0)
O2 SAT: 100 %
O2 SAT: 99 %
O2 SAT: 100 %
PCO2 ART: 36.5 mmHg (ref 35.0–45.0)
TCO2: 21 mmol/L (ref 0–100)
TCO2: 24 mmol/L (ref 0–100)
TCO2: 20 mmol/L (ref 0–100)
pCO2 arterial: 36.5 mmHg (ref 35.0–45.0)
pCO2 arterial: 30.4 mmHg — ABNORMAL LOW (ref 35.0–45.0)
pH, Arterial: 7.339 — ABNORMAL LOW (ref 7.350–7.450)
pH, Arterial: 7.408 (ref 7.350–7.450)
pH, Arterial: 7.397 (ref 7.350–7.450)
pO2, Arterial: 162 mmHg — ABNORMAL HIGH (ref 80.0–100.0)
pO2, Arterial: 237 mmHg — ABNORMAL HIGH (ref 80.0–100.0)
pO2, Arterial: 195 mmHg — ABNORMAL HIGH (ref 80.0–100.0)

## 2016-03-19 LAB — I-STAT CG4 LACTIC ACID, ED: Lactic Acid, Venous: 5.99 mmol/L (ref 0.5–1.9)

## 2016-03-19 LAB — POCT I-STAT, CHEM 8
BUN: 26 mg/dL — ABNORMAL HIGH (ref 6–20)
BUN: 20 mg/dL (ref 6–20)
BUN: 21 mg/dL — ABNORMAL HIGH (ref 6–20)
CALCIUM ION: 0.84 mmol/L — AB (ref 1.13–1.30)
CHLORIDE: 105 mmol/L (ref 101–111)
CHLORIDE: 106 mmol/L (ref 101–111)
CHLORIDE: 109 mmol/L (ref 101–111)
CREATININE: 1 mg/dL (ref 0.61–1.24)
CREATININE: 0.9 mg/dL (ref 0.61–1.24)
Calcium, Ion: 1.05 mmol/L — ABNORMAL LOW (ref 1.13–1.30)
Calcium, Ion: 0.91 mmol/L — ABNORMAL LOW (ref 1.13–1.30)
Creatinine, Ser: 1.3 mg/dL — ABNORMAL HIGH (ref 0.61–1.24)
GLUCOSE: 315 mg/dL — AB (ref 65–99)
GLUCOSE: 220 mg/dL — AB (ref 65–99)
Glucose, Bld: 211 mg/dL — ABNORMAL HIGH (ref 65–99)
HCT: 38 % — ABNORMAL LOW (ref 39.0–52.0)
HCT: 38 % — ABNORMAL LOW (ref 39.0–52.0)
HEMATOCRIT: 43 % (ref 39.0–52.0)
HEMOGLOBIN: 14.6 g/dL (ref 13.0–17.0)
Hemoglobin: 12.9 g/dL — ABNORMAL LOW (ref 13.0–17.0)
Hemoglobin: 12.9 g/dL — ABNORMAL LOW (ref 13.0–17.0)
POTASSIUM: 6.3 mmol/L — AB (ref 3.5–5.1)
POTASSIUM: 4.4 mmol/L (ref 3.5–5.1)
POTASSIUM: 3.6 mmol/L (ref 3.5–5.1)
SODIUM: 141 mmol/L (ref 135–145)
Sodium: 138 mmol/L (ref 135–145)
Sodium: 144 mmol/L (ref 135–145)
TCO2: 22 mmol/L (ref 0–100)
TCO2: 17 mmol/L (ref 0–100)
TCO2: 18 mmol/L (ref 0–100)

## 2016-03-19 LAB — CBC WITH DIFFERENTIAL/PLATELET
BASOS PCT: 1 %
Basophils Absolute: 0.1 10*3/uL (ref 0.0–0.1)
EOS ABS: 0.2 10*3/uL (ref 0.0–0.7)
Eosinophils Relative: 2 %
HCT: 41.8 % (ref 39.0–52.0)
Hemoglobin: 13.2 g/dL (ref 13.0–17.0)
LYMPHS ABS: 3.8 10*3/uL (ref 0.7–4.0)
LYMPHS PCT: 31 %
MCH: 23 pg — AB (ref 26.0–34.0)
MCHC: 31.6 g/dL (ref 30.0–36.0)
MCV: 72.8 fL — AB (ref 78.0–100.0)
MONO ABS: 0.6 10*3/uL (ref 0.1–1.0)
Monocytes Relative: 5 %
NEUTROS ABS: 7.6 10*3/uL (ref 1.7–7.7)
NEUTROS PCT: 61 %
PLATELETS: 233 10*3/uL (ref 150–400)
RBC: 5.74 MIL/uL (ref 4.22–5.81)
RDW: 21.5 % — ABNORMAL HIGH (ref 11.5–15.5)
WBC: 12.3 10*3/uL — AB (ref 4.0–10.5)

## 2016-03-19 LAB — COMPREHENSIVE METABOLIC PANEL
ALT: 86 U/L — ABNORMAL HIGH (ref 17–63)
ANION GAP: 9 (ref 5–15)
AST: 127 U/L — AB (ref 15–41)
Albumin: 3.4 g/dL — ABNORMAL LOW (ref 3.5–5.0)
Alkaline Phosphatase: 92 U/L (ref 38–126)
BUN: 20 mg/dL (ref 6–20)
CHLORIDE: 105 mmol/L (ref 101–111)
CO2: 23 mmol/L (ref 22–32)
Calcium: 8.4 mg/dL — ABNORMAL LOW (ref 8.9–10.3)
Creatinine, Ser: 1.55 mg/dL — ABNORMAL HIGH (ref 0.61–1.24)
GFR, EST AFRICAN AMERICAN: 57 mL/min — AB (ref >60)
GFR, EST NON AFRICAN AMERICAN: 49 mL/min — AB (ref >60)
Glucose, Bld: 232 mg/dL — ABNORMAL HIGH (ref 65–99)
POTASSIUM: 4.1 mmol/L (ref 3.5–5.1)
Sodium: 137 mmol/L (ref 135–145)
Total Bilirubin: 1 mg/dL (ref 0.3–1.2)
Total Protein: 6.5 g/dL (ref 6.5–8.1)

## 2016-03-19 LAB — PROTIME-INR
INR: 1.9
INR: 2.03
PROTHROMBIN TIME: 22 s — AB (ref 11.4–15.2)
Prothrombin Time: 23.3 seconds — ABNORMAL HIGH (ref 11.4–15.2)

## 2016-03-19 LAB — GLUCOSE, CAPILLARY
GLUCOSE-CAPILLARY: 266 mg/dL — AB (ref 65–99)
GLUCOSE-CAPILLARY: 238 mg/dL — AB (ref 65–99)
GLUCOSE-CAPILLARY: 186 mg/dL — AB (ref 65–99)
Glucose-Capillary: 174 mg/dL — ABNORMAL HIGH (ref 65–99)
Glucose-Capillary: 265 mg/dL — ABNORMAL HIGH (ref 65–99)
Glucose-Capillary: 270 mg/dL — ABNORMAL HIGH (ref 65–99)
Glucose-Capillary: 174 mg/dL — ABNORMAL HIGH (ref 65–99)
Glucose-Capillary: 191 mg/dL — ABNORMAL HIGH (ref 65–99)

## 2016-03-19 LAB — I-STAT CHEM 8, ED
BUN: 25 mg/dL — ABNORMAL HIGH (ref 6–20)
CHLORIDE: 101 mmol/L (ref 101–111)
Calcium, Ion: 1.06 mmol/L — ABNORMAL LOW (ref 1.13–1.30)
Creatinine, Ser: 1.3 mg/dL — ABNORMAL HIGH (ref 0.61–1.24)
GLUCOSE: 221 mg/dL — AB (ref 65–99)
HCT: 45 % (ref 39.0–52.0)
Hemoglobin: 15.3 g/dL (ref 13.0–17.0)
POTASSIUM: 4.1 mmol/L (ref 3.5–5.1)
Sodium: 140 mmol/L (ref 135–145)
TCO2: 24 mmol/L (ref 0–100)

## 2016-03-19 LAB — LACTIC ACID, PLASMA
LACTIC ACID, VENOUS: 2.2 mmol/L — AB (ref 0.5–1.9)
LACTIC ACID, VENOUS: 2 mmol/L — AB (ref 0.5–1.9)

## 2016-03-19 LAB — APTT
APTT: 37 s — AB (ref 24–36)
aPTT: 34 seconds (ref 24–36)

## 2016-03-19 LAB — MAGNESIUM: Magnesium: 1.8 mg/dL (ref 1.7–2.4)

## 2016-03-19 LAB — TYPE AND SCREEN
ABO/RH(D): O POS
Antibody Screen: NEGATIVE

## 2016-03-19 LAB — LIPID PANEL
CHOL/HDL RATIO: 3.4 ratio
Cholesterol: 95 mg/dL (ref 0–200)
HDL: 28 mg/dL — AB (ref >40)
LDL CALC: 46 mg/dL (ref 0–99)
TRIGLYCERIDES: 103 mg/dL (ref <150)
VLDL: 21 mg/dL (ref 0–40)

## 2016-03-19 LAB — TROPONIN I
TROPONIN I: 0.1 ng/mL — AB (ref <0.03)
TROPONIN I: 0.35 ng/mL — AB (ref <0.03)
TROPONIN I: 0.26 ng/mL — AB (ref <0.03)

## 2016-03-19 LAB — I-STAT TROPONIN, ED: TROPONIN I, POC: 0.07 ng/mL (ref 0.00–0.08)

## 2016-03-19 LAB — PHOSPHORUS: Phosphorus: 3.9 mg/dL (ref 2.5–4.6)

## 2016-03-19 LAB — BRAIN NATRIURETIC PEPTIDE: B NATRIURETIC PEPTIDE 5: 597.3 pg/mL — AB (ref 0.0–100.0)

## 2016-03-19 LAB — ABO/RH: ABO/RH(D): O POS

## 2016-03-19 LAB — T4, FREE: Free T4: 1.51 ng/dL — ABNORMAL HIGH (ref 0.61–1.12)

## 2016-03-19 LAB — I-STAT CREATININE, ED: CREATININE: 1.4 mg/dL — AB (ref 0.61–1.24)

## 2016-03-19 LAB — TSH: TSH: 1.491 u[IU]/mL (ref 0.350–4.500)

## 2016-03-19 LAB — MRSA PCR SCREENING: MRSA by PCR: NEGATIVE

## 2016-03-19 SURGERY — LEFT HEART CATH AND CORONARY ANGIOGRAPHY
Anesthesia: LOCAL

## 2016-03-19 MED ORDER — SODIUM CHLORIDE 0.9 % IV SOLN
250.0000 mL | INTRAVENOUS | Status: DC | PRN
  Administered 2016-03-20 – 2016-03-27 (×2): 250 mL via INTRAVENOUS

## 2016-03-19 MED ORDER — AMIODARONE HCL 200 MG PO TABS: 200.0000 mg | ORAL_TABLET | Freq: Two times a day (BID) | ORAL | Status: DC

## 2016-03-19 MED ORDER — SODIUM CHLORIDE 0.9 % IV SOLN
1.0000 mg/h | INTRAVENOUS | Status: DC
  Administered 2016-03-19: 1 mg/h via INTRAVENOUS
  Filled 2016-03-19: qty 10

## 2016-03-19 MED ORDER — ASPIRIN 300 MG RE SUPP
300.0000 mg | RECTAL | Status: AC
  Administered 2016-03-19: 300 mg via RECTAL

## 2016-03-19 MED ORDER — SODIUM CHLORIDE 0.9 % IV SOLN
2.0000 mg/h | INTRAVENOUS | Status: DC
  Administered 2016-03-19 (×2): 2 mg/h via INTRAVENOUS
  Administered 2016-03-20 – 2016-03-21 (×2): 3 mg/h via INTRAVENOUS
  Filled 2016-03-19 (×4): qty 10

## 2016-03-19 MED ORDER — IOPAMIDOL (ISOVUE-370) INJECTION 76%
INTRAVENOUS | Status: DC | PRN
  Administered 2016-03-19: 60 mL via INTRA_ARTERIAL

## 2016-03-19 MED ORDER — ROSUVASTATIN CALCIUM 20 MG PO TABS
20.0000 mg | ORAL_TABLET | Freq: Every day | ORAL | Status: DC
  Administered 2016-03-19 – 2016-03-28 (×10): 20 mg
  Filled 2016-03-19 (×10): qty 1

## 2016-03-19 MED ORDER — SODIUM CHLORIDE 0.9% FLUSH
3.0000 mL | Freq: Two times a day (BID) | INTRAVENOUS | Status: DC
  Administered 2016-03-19: 3 mL via INTRAVENOUS

## 2016-03-19 MED ORDER — SODIUM CHLORIDE 0.9% FLUSH
3.0000 mL | Freq: Two times a day (BID) | INTRAVENOUS | Status: DC
  Administered 2016-03-19 – 2016-03-28 (×16): 3 mL via INTRAVENOUS

## 2016-03-19 MED ORDER — DIGOXIN 125 MCG PO TABS
0.1250 mg | ORAL_TABLET | Freq: Every day | ORAL | Status: DC
  Filled 2016-03-19: qty 1

## 2016-03-19 MED ORDER — SODIUM CHLORIDE 0.9 % IV SOLN
1.0000 ug/kg/min | INTRAVENOUS | Status: DC
  Administered 2016-03-19 – 2016-03-20 (×2): 1 ug/kg/min via INTRAVENOUS
  Filled 2016-03-19 (×2): qty 20

## 2016-03-19 MED ORDER — AMIODARONE HCL IN DEXTROSE 360-4.14 MG/200ML-% IV SOLN
60.0000 mg/h | INTRAVENOUS | Status: AC
  Administered 2016-03-19 (×2): 60 mg/h via INTRAVENOUS
  Filled 2016-03-19: qty 400

## 2016-03-19 MED ORDER — AMIODARONE HCL IN DEXTROSE 360-4.14 MG/200ML-% IV SOLN
30.0000 mg/h | INTRAVENOUS | Status: DC
  Administered 2016-03-20 – 2016-03-25 (×11): 30 mg/h via INTRAVENOUS
  Filled 2016-03-19 (×12): qty 200

## 2016-03-19 MED ORDER — SODIUM CHLORIDE 0.9% FLUSH: 3.0000 mL | INTRAVENOUS | Status: DC | PRN

## 2016-03-19 MED ORDER — NOREPINEPHRINE BITARTRATE 1 MG/ML IV SOLN
0.0000 ug/min | INTRAVENOUS | Status: DC
  Administered 2016-03-20: 10 ug/min via INTRAVENOUS
  Administered 2016-03-20: 3 ug/min via INTRAVENOUS
  Administered 2016-03-21: 18 ug/min via INTRAVENOUS
  Administered 2016-03-22: 16 ug/min via INTRAVENOUS
  Administered 2016-03-23: 8 ug/min via INTRAVENOUS
  Filled 2016-03-19 (×4): qty 16

## 2016-03-19 MED ORDER — ACETAMINOPHEN 325 MG PO TABS
650.0000 mg | ORAL_TABLET | ORAL | Status: DC | PRN
  Administered 2016-03-25 – 2016-03-27 (×5): 650 mg via ORAL
  Filled 2016-03-19 (×5): qty 2

## 2016-03-19 MED ORDER — WARFARIN - PHARMACIST DOSING INPATIENT: Freq: Every day | Status: DC

## 2016-03-19 MED ORDER — MIDAZOLAM HCL 2 MG/2ML IJ SOLN
INTRAMUSCULAR | Status: DC | PRN
  Administered 2016-03-19: 1 mg via INTRAVENOUS

## 2016-03-19 MED ORDER — SODIUM CHLORIDE 0.9 % IV SOLN
INTRAVENOUS | Status: DC
  Administered 2016-03-19: 1.3 [IU]/h via INTRAVENOUS
  Filled 2016-03-19: qty 2.5

## 2016-03-19 MED ORDER — SODIUM CHLORIDE 0.9% FLUSH
10.0000 mL | INTRAVENOUS | Status: DC | PRN
  Administered 2016-03-19: 30 mL
  Filled 2016-03-19: qty 40

## 2016-03-19 MED ORDER — SODIUM CHLORIDE 0.9 % IV SOLN
1.0000 ug/kg/min | INTRAVENOUS | Status: DC
  Filled 2016-03-19: qty 20

## 2016-03-19 MED ORDER — SODIUM CHLORIDE 0.9% FLUSH
10.0000 mL | Freq: Two times a day (BID) | INTRAVENOUS | Status: DC
  Administered 2016-03-19: 30 mL
  Administered 2016-03-20: 10 mL
  Administered 2016-03-20: 30 mL
  Administered 2016-03-21: 20 mL
  Administered 2016-03-22 – 2016-03-26 (×7): 10 mL
  Administered 2016-03-27: 30 mL
  Administered 2016-03-27 – 2016-03-28 (×2): 10 mL
  Administered 2016-03-28: 30 mL

## 2016-03-19 MED ORDER — IOPAMIDOL (ISOVUE-370) INJECTION 76%
INTRAVENOUS | Status: AC
  Filled 2016-03-19: qty 100

## 2016-03-19 MED ORDER — NOREPINEPHRINE BITARTRATE 1 MG/ML IV SOLN
0.0000 ug/min | INTRAVENOUS | Status: DC
  Filled 2016-03-19: qty 4

## 2016-03-19 MED ORDER — LIDOCAINE HCL (PF) 1 % IJ SOLN
INTRAMUSCULAR | Status: AC
  Filled 2016-03-19: qty 30

## 2016-03-19 MED ORDER — VERAPAMIL HCL 2.5 MG/ML IV SOLN
INTRAVENOUS | Status: AC
  Filled 2016-03-19: qty 2

## 2016-03-19 MED ORDER — FAMOTIDINE IN NACL 20-0.9 MG/50ML-% IV SOLN
20.0000 mg | Freq: Two times a day (BID) | INTRAVENOUS | Status: DC
  Administered 2016-03-19 – 2016-03-28 (×20): 20 mg via INTRAVENOUS
  Filled 2016-03-19 (×20): qty 50

## 2016-03-19 MED ORDER — ANTISEPTIC ORAL RINSE SOLUTION (CORINZ)
7.0000 mL | OROMUCOSAL | Status: DC
  Administered 2016-03-19 – 2016-03-26 (×64): 7 mL via OROMUCOSAL

## 2016-03-19 MED ORDER — SODIUM CHLORIDE 0.9 % IV SOLN
100.0000 ug/h | INTRAVENOUS | Status: DC
  Administered 2016-03-19: 100 ug/h via INTRAVENOUS
  Administered 2016-03-20 (×2): 150 ug/h via INTRAVENOUS
  Filled 2016-03-19 (×4): qty 50

## 2016-03-19 MED ORDER — ARTIFICIAL TEARS OP OINT
1.0000 "application " | TOPICAL_OINTMENT | Freq: Three times a day (TID) | OPHTHALMIC | Status: DC
  Administered 2016-03-19 – 2016-03-23 (×12): 1 via OPHTHALMIC
  Filled 2016-03-19 (×2): qty 3.5

## 2016-03-19 MED ORDER — SODIUM CHLORIDE 0.9 % IV SOLN
10.0000 ug/h | INTRAVENOUS | Status: DC
  Administered 2016-03-19: 50 ug/h via INTRAVENOUS
  Filled 2016-03-19: qty 50

## 2016-03-19 MED ORDER — SODIUM CHLORIDE 0.9 % IV SOLN
2000.0000 mL | Freq: Once | INTRAVENOUS | Status: AC
  Administered 2016-03-19: 500 mL via INTRAVENOUS

## 2016-03-19 MED ORDER — AMIODARONE LOAD VIA INFUSION
150.0000 mg | Freq: Once | INTRAVENOUS | Status: AC
  Administered 2016-03-19: 150 mg via INTRAVENOUS
  Filled 2016-03-19: qty 83.34

## 2016-03-19 MED ORDER — WARFARIN SODIUM 2.5 MG PO TABS
2.5000 mg | ORAL_TABLET | Freq: Once | ORAL | Status: DC
  Filled 2016-03-19: qty 1

## 2016-03-19 MED ORDER — SODIUM BICARBONATE 8.4 % IV SOLN
INTRAVENOUS | Status: AC
  Filled 2016-03-19: qty 50

## 2016-03-19 MED ORDER — ONDANSETRON HCL 4 MG/2ML IJ SOLN: 4.0000 mg | Freq: Four times a day (QID) | INTRAMUSCULAR | Status: DC | PRN

## 2016-03-19 MED ORDER — CHLORHEXIDINE GLUCONATE 0.12% ORAL RINSE (MEDLINE KIT)
15.0000 mL | Freq: Two times a day (BID) | OROMUCOSAL | Status: DC
  Administered 2016-03-19 – 2016-03-26 (×14): 15 mL via OROMUCOSAL

## 2016-03-19 MED ORDER — CLOPIDOGREL BISULFATE 75 MG PO TABS
75.0000 mg | ORAL_TABLET | Freq: Every day | ORAL | Status: DC
  Administered 2016-03-19 – 2016-03-28 (×10): 75 mg
  Filled 2016-03-19 (×11): qty 1

## 2016-03-19 MED ORDER — CISATRACURIUM BOLUS VIA INFUSION
0.0500 mg/kg | INTRAVENOUS | Status: DC | PRN
  Filled 2016-03-19: qty 5

## 2016-03-19 MED ORDER — CISATRACURIUM BOLUS VIA INFUSION
0.1000 mg/kg | Freq: Once | INTRAVENOUS | Status: AC
  Administered 2016-03-19: 9.6 mg via INTRAVENOUS
  Filled 2016-03-19: qty 10

## 2016-03-19 MED ORDER — MIDAZOLAM HCL 2 MG/2ML IJ SOLN: 2.0000 mg | Freq: Once | INTRAMUSCULAR | Status: DC

## 2016-03-19 MED ORDER — LIDOCAINE HCL (PF) 1 % IJ SOLN
INTRAMUSCULAR | Status: DC | PRN
  Administered 2016-03-19: 2 mL

## 2016-03-19 MED ORDER — INSULIN ASPART 100 UNIT/ML ~~LOC~~ SOLN
0.0000 [IU] | SUBCUTANEOUS | Status: DC
  Administered 2016-03-19: 8 [IU] via SUBCUTANEOUS
  Administered 2016-03-19: 5 [IU] via SUBCUTANEOUS

## 2016-03-19 MED ORDER — SODIUM CHLORIDE 0.9 % IV SOLN: INTRAVENOUS | Status: DC | PRN

## 2016-03-19 MED ORDER — NOREPINEPHRINE BITARTRATE 1 MG/ML IV SOLN
0.0000 ug/min | INTRAVENOUS | Status: DC
  Administered 2016-03-19: 5 ug/min via INTRAVENOUS
  Filled 2016-03-19: qty 16

## 2016-03-19 MED ORDER — SODIUM CHLORIDE 0.9 % IV SOLN: 250.0000 mL | INTRAVENOUS | Status: DC | PRN

## 2016-03-19 MED ORDER — FENTANYL CITRATE (PF) 100 MCG/2ML IJ SOLN
100.0000 ug | Freq: Once | INTRAMUSCULAR | Status: AC
  Administered 2016-03-19: 100 ug via INTRAVENOUS

## 2016-03-19 MED ORDER — SODIUM BICARBONATE 8.4 % IV SOLN
INTRAVENOUS | Status: DC | PRN
  Administered 2016-03-19: 50 meq via INTRAVENOUS

## 2016-03-19 MED ORDER — NOREPINEPHRINE BITARTRATE 1 MG/ML IV SOLN
0.0000 ug/min | INTRAVENOUS | Status: DC
  Administered 2016-03-19: 5 ug/min via INTRAVENOUS
  Filled 2016-03-19: qty 4

## 2016-03-19 MED ORDER — FENTANYL BOLUS VIA INFUSION
50.0000 ug | INTRAVENOUS | Status: DC | PRN
  Filled 2016-03-19: qty 50

## 2016-03-19 MED ORDER — SODIUM CHLORIDE 0.9 % IV SOLN
3.0000 g | Freq: Three times a day (TID) | INTRAVENOUS | Status: DC
  Administered 2016-03-19 – 2016-03-26 (×21): 3 g via INTRAVENOUS
  Filled 2016-03-19 (×23): qty 3

## 2016-03-19 MED ORDER — MIDAZOLAM BOLUS VIA INFUSION
2.0000 mg | INTRAVENOUS | Status: DC | PRN
  Filled 2016-03-19: qty 2

## 2016-03-19 SURGICAL SUPPLY — 8 items
CATH OPTITORQUE TIG 4.0 5F (CATHETERS) ×1 IMPLANT
DEVICE RAD COMP TR BAND LRG (VASCULAR PRODUCTS) ×1 IMPLANT
GLIDESHEATH SLEND SS 6F .021 (SHEATH) ×1 IMPLANT
KIT HEART LEFT (KITS) ×2 IMPLANT
PACK CARDIAC CATHETERIZATION (CUSTOM PROCEDURE TRAY) ×2 IMPLANT
TRANSDUCER W/STOPCOCK (MISCELLANEOUS) ×2 IMPLANT
TUBING CIL FLEX 10 FLL-RA (TUBING) ×2 IMPLANT
WIRE SAFE-T 1.5MM-J .035X260CM (WIRE) ×1 IMPLANT

## 2016-03-19 NOTE — ED Notes (Signed)
Cooling pads applied to pt and transported to cath lab. Bed side report given.

## 2016-03-19 NOTE — Progress Notes (Signed)
STAT EEG completed. Results pending.

## 2016-03-19 NOTE — Progress Notes (Signed)
RT Note: unable to place A-line, Elink MD called and made aware, will hold at this time, ABG done, results called to MD, RT will monitor

## 2016-03-19 NOTE — Progress Notes (Signed)
ANTICOAGULATION CONSULT NOTE - Initial Consult  Pharmacy Consult for Coumadin Indication: atrial flutter  Allergies  Allergen Reactions  . Heparin Other (See Comments)    Will not use pork-derived products due to religious beliefs   . Lovenox [Enoxaparin Sodium] Other (See Comments)    Will not use pork-derived products due to religious beliefs   . Pork-Derived Products Other (See Comments)    Does not eat pork or pork derived products d/t religious reasons    Patient Measurements: Height: 5\' 7"  (170.2 cm) Weight: 211 lb (95.7 kg) IBW/kg (Calculated) : 66.1  Vital Signs: Temp: 96.4 F (35.8 C) (08/18 1331) Temp Source: Core (Comment) (08/18 1331) BP: 107/85 (08/18 1331) Pulse Rate: 49 (08/18 1300)  Labs:  Recent Labs  Apr 05, 2016 1011 04/05/2016 1013 04-05-2016 1015  HGB 15.3  --  13.2  HCT 45.0  --  41.8  PLT  --   --  233  LABPROT  --   --  22.0*  INR  --   --  1.90  CREATININE 1.30* 1.40* 1.55*  TROPONINI  --   --  0.10*    Estimated Creatinine Clearance: 60 mL/min (by C-G formula based on SCr of 1.55 mg/dL).   Medical History: Past Medical History:  Diagnosis Date  . AICD (automatic cardioverter/defibrillator) present   . Cholelithiasis   . Chronic systolic heart failure (HCC)   . Coronary artery disease    distal vessel disease  . GERD (gastroesophageal reflux disease)   . High cholesterol   . Hypertension   . Kidney stone   . NSTEMI (non-ST elevated myocardial infarction) (HCC)   . Pneumonia 2002  . Type II diabetes mellitus (HCC)    Assessment: 55yom on coumadin pta for aflutter, admitted s/p cardiac arrest and now on hypothermia protocol. INR 1.9 on admit and coumadin to continue. Cannot use heparin/lovenox due to religious beliefs. CBC wnl.   PTA dose: 1.25mg  daily  Goal of Therapy:  INR 2-3 Monitor platelets by anticoagulation protocol: Yes   Plan:  1) Coumadin 2.5mg  x 1 2) Daily INR  Fredrik Rigger 2016-04-05,1:42 PM

## 2016-03-19 NOTE — Progress Notes (Signed)
CRITICAL VALUE ALERT  Critical value received:  lactic acid  Date of notification:  04/05/16  Time of notification:  2115  Critical value read back:Yes.    Nurse who received alert:  Melissa Montane  MD notified (1st page):  Dr. Kristopher Oppenheim  Time of first page:  2115  MD notified (2nd page):  Time of second page:  Responding MD: Dr.Manem  Time MD responded:  2115

## 2016-03-19 NOTE — Progress Notes (Signed)
Responded to page from ED secretary to provide support to patient's family who had been placed in consultation room. Pt was a witnessed arrest. CPR started by son.   I met with family and provided empathetic listening and  information sharing between staff and family. Patient went to cath lab. Patient is supported by two sons and wife. Chaplain available as needed.   03/17/2016 1000  Clinical Encounter Type  Visited With Family;Patient not available;Health care provider  Visit Type Initial;Spiritual support;Pre-op;Critical Care;ED  Referral From Nurse  Spiritual Encounters  Spiritual Needs Emotional  Stress Factors  Family Stress Factors Exhausted;Health changes  Cristopher Peru, [facility], Pager [phone]

## 2016-03-19 NOTE — H&P (Signed)
History and Physical Interval Note:  NAME:  Benjamin [name]   MRN: [id number] DOB:  [birth year]   ADMIT DATE: 03/30/2016   03/12/2016 10:54 AM  Benjamin [name] is a 55 y.o. male with CM - EF 20%, s/p ICD & complext Impella supported Atherectomy byased PCI of RCA & mLAD.  Was doing well until this AM- became unresponsive with agonal breathing, bystander CPR done.  ICD shock, but remained in VF.  EMS defibrillation to paced rhythm.  Intubated in the field.  Unable to interpret EKG due to paced rhythm.   Plan was to bring emergently to the cath lab for invasive coronary evaluation to exclude ischemic VF.  Full H&P per Dr. Haroldine Laws   Past Medical History:  Diagnosis Date  . AICD (automatic cardioverter/defibrillator) present   . Cholelithiasis   . Chronic systolic heart failure ([place])   . Coronary artery disease    distal vessel disease  . GERD (gastroesophageal reflux disease)   . High cholesterol   . Hypertension   . Kidney stone   . NSTEMI (non-ST elevated myocardial infarction) ([place])   . Pneumonia 2002  . Type II diabetes mellitus ([place])    Past Surgical History:  Procedure Laterality Date  . CARDIAC CATHETERIZATION N/A 10/07/2015   Procedure: Left Heart Cath and Coronary Angiography;  Surgeon: Belva Crome, MD;  Location: [place] [facility];  Service: Cardiovascular;  Laterality: N/A;  . CARDIAC CATHETERIZATION N/A 11/05/2015   Procedure: Coronary Stent Intervention Rotablater;  Surgeon: Belva Crome, MD;  Location: [place] [facility];  Service: Cardiovascular;  Laterality: N/A;  . CHOLECYSTECTOMY  10/09/2015   Procedure: LAPAROSCOPIC CHOLECYSTECTOMY;  Surgeon: Fanny Skates, MD;  Location: [place];  Service: General;;  . COLONOSCOPY N/A 07/12/2013   Procedure: COLONOSCOPY;  Surgeon: Lear Ng, MD;  Location: [facility];  Service: Endoscopy;  Laterality: N/A;  . ELECTROPHYSIOLOGIC STUDY N/A 01/23/2016   Procedure: A-Flutter/A-Tach/SVT Ablation;  Surgeon: Evans Lance, MD;   Location: [place] [facility];  Service: Cardiovascular;  Laterality: N/A;  . EP IMPLANTABLE DEVICE N/A 05/09/2015   Procedure: BiV ICD Insertion CRT-D;  Surgeon: Evans Lance, MD;  Location: [place] [facility];  Service: Cardiovascular;  Laterality: N/A;    FAMHx: Family History  Problem Relation Age of Onset  . Diabetes Mother   . Hypertension Mother   . Hyperlipidemia Mother   . Stroke Mother   . Diabetes Father   . Hypertension Father   . Stroke Father 23    SOCHx:  reports that he has never smoked. He has never used smokeless tobacco. He reports that he does not drink alcohol or use drugs.  ALLERGIES: Allergies  Allergen Reactions  . Heparin Other (See Comments)    Will not use pork-derived products due to religious beliefs   . Lovenox [Enoxaparin Sodium] Other (See Comments)    Will not use pork-derived products due to religious beliefs   . Pork-Derived Products Other (See Comments)    Does not eat pork or pork derived products d/t religious reasons    HOME MEDICATIONS: Prescriptions Prior to Admission  Medication Sig Dispense Refill Last Dose  . amiodarone (PACERONE) 200 MG tablet Take 1 tablet (200 mg total) by mouth 2 (two) times daily. 60 tablet 3   . Blood Glucose Monitoring Suppl (ACCU-CHEK AVIVA CONNECT) w/Device KIT 1 Device by Does not apply route 3 (three) times daily. (Patient taking differently: 1 Device by Does not apply route See admin instructions.  Check blood sugar once daily) 1 kit 0 04/01/2016 at Unknown time  . carvedilol (COREG) 3.125 MG tablet Take 1 tablet (3.125 mg total) by mouth 2 (two) times daily with a meal. 60 tablet 2 Taking  . clopidogrel (PLAVIX) 75 MG tablet Take 1 tablet (75 mg total) by mouth daily. 30 tablet 3 Taking  . furosemide (LASIX) 20 MG tablet Take 40 mg by mouth daily.  3 Taking  . glucose blood (ACCU-CHEK AVIVA) test strip Check Blood glucose three times daily. (Patient taking differently: 1 each by Other route See admin  instructions. Check blood sugar once daily) 100 each 12 03/07/2016 at Unknown time  . insulin aspart (NOVOLOG FLEXPEN) 100 UNIT/ML FlexPen Inject 5-10 Units into the skin 2 (two) times daily as needed for high blood sugar (CBG >140). Dose is based on size of meal 15 mL 3 Taking  . Insulin Glargine (LANTUS SOLOSTAR) 100 UNIT/ML Solostar Pen Inject 20 Units into the skin daily at 10 pm. 5 pen 3 Taking  . Lancet Devices (ACCU-CHEK SOFTCLIX) lancets Check blood glucose three times daily. (Patient taking differently: 1 each by Other route See admin instructions. Check blood sugar once daily) 1 each 12 03/30/2016 at Unknown time  . potassium chloride (KLOR-CON) 20 MEQ packet Take 20 mEq by mouth every other day. (Patient taking differently: Take 20 mEq by mouth daily. ) 30 tablet 3 Taking  . rosuvastatin (CRESTOR) 20 MG tablet Take 1 tablet (20 mg total) by mouth daily. 30 tablet 3 Taking  . sacubitril-valsartan (ENTRESTO) 24-26 MG Take 1 tablet by mouth 2 (two) times daily. 60 tablet 3 Taking  . warfarin (COUMADIN) 2.5 MG tablet Take 1.25 mg by mouth daily. 1/2 tablet every day  5 Taking  . albuterol (PROVENTIL HFA;VENTOLIN HFA) 108 (90 Base) MCG/ACT inhaler Inhale 2 puffs into the lungs every 6 (six) hours as needed for wheezing or shortness of breath. 1 Inhaler 0 Taking  . digoxin (LANOXIN) 0.125 MG tablet Take 1 tablet (0.125 mg total) by mouth daily. (Patient not taking: Reported on 03/21/2016) 30 tablet 3 Not Taking at Unknown time  . metFORMIN (GLUCOPHAGE) 1000 MG tablet Take 1,000 mg by mouth daily with lunch.    Taking  . nitroGLYCERIN (NITROSTAT) 0.4 MG SL tablet Place 1 tablet (0.4 mg total) under the tongue every 5 (five) minutes x 3 doses as needed for chest pain. 25 tablet 3 Unknown at Unknown  . podofilox (CONDYLOX) 0.5 % external solution Apply topically 2 (two) times daily. For 3 days then off for 4 days; repeat every week for 4 weeks. 3.5 mL 1 Taking  . pregabalin (LYRICA) 75 MG capsule Take 2  capsules (150 mg total) by mouth at bedtime. 60 capsule 3   . traMADol (ULTRAM) 50 MG tablet Take 1 tablet (50 mg total) by mouth every 6 (six) hours as needed. (pain) 30 tablet 0 Taking    PHYSICAL EXAM:Blood pressure 103/74, pulse 60, temperature 98.7 F (37.1 C), temperature source Rectal, resp. rate 18, height _0  (1.702 m), weight 211 lb (95.7 kg), SpO2 100 %. Per Dr. Haroldine Laws H&P   Adult ECG Report Unable to interpret - paced rhythm  IMPRESSION & PLAN The patients' history has been reviewed, patient examined, no change in status from most recent note, stable for surgery. I have reviewed the patients' chart and labs. Questions were answered to the patient's satisfaction.    Benjamin [name] has presented today for surgery, with the diagnosis of chest pain The  various methods of treatment have been discussed with the patient and family.   Risks / Complications include, but not limited to: Death, MI, CVA/TIA, VF/VT (with defibrillation), Bradycardia (need for temporary pacer placement), contrast induced nephropathy, bleeding / bruising / hematoma / pseudoaneurysm, vascular or coronary injury (with possible emergent CT or Vascular Surgery), adverse medication reactions, infection.     After consideration of risks, benefits and other options for treatment, the patient has consented to Procedure(s):  LEFT HEART CATHETERIZATION AND CORONARY ANGIOGRAPHY +/- AD HOC PERCUTANEOUS CORONARY INTERVENTION Possible mechanical support with IABP +/- Impella   as a surgical intervention.   We will proceed with the planned procedure.    Glenetta Hew, M.D., M.S. Interventional Cardiologist   Pager # [phone] Phone # [phone] [address]. [place], [place] [address]  03/03/2016 10:54 AM

## 2016-03-19 NOTE — H&P (Signed)
ADVANCED HF HISTORY & PHYSICAL  Reason for Admit: VT/VF arrest   HPI:  Benjamin [name] is 55 year old male with history of systolic HF due to ischemic cardiomyopathy (EF 20-25%) S/P St Jude bi-V ICD implanted October 2016, PAFL, hypertension, diabetes mellitus, hyperlipidemia who presents with VT/VF arrest at home.   Had cath in 3/17 prior to cholecytstectomy which complex, calcified, multivessel coronary disease with high-grade obstruction in the mid LAD and distal RCA.   Admitted in April with AFL. Converted with amio. And then underwent 2-V PCI of LAD and RCA with rotoblator under Impella support.   Admitted June 2017 for volume overload in setting of recurrent AFL. Had successful A flutter ablation. Diuresed with IV lasix and transitioned to 40 mg lasix daily. He continued on amiodarone and coumadin. Discharge weight was 226 pounds.   Had been doing well just returned from trip to [place] with his wife. Was in bed this am and wife went to bathroom. She hear him making a strange sound so came out of bathroom and found him unresponsive on the bed. She called her son who was downstairs. He called 911 and immediately stadted CPR. While doing CPR he felt ICD shock. Intubated in field and brought to ER. In ER, unresponsive but had stable BP and HR. Patient did not complain of CP prior to arrest.  ICD interrogated personally showed VF that was just below the 200 beat threshold for a while (poor R wave detection) and finally received shock back to NSR. In ER decision made to take emergently to cath lab for coronary angiogram and beginning cooling process. Discussed with interventional team and CCM personally.     Review of Systems: unavailable as patient unconscious  Past Medical History:  Diagnosis Date  . AICD (automatic cardioverter/defibrillator) present   . Cholelithiasis   . Chronic systolic heart failure ([place])   . Coronary artery disease    distal vessel disease  . GERD  (gastroesophageal reflux disease)   . High cholesterol   . Hypertension   . Kidney stone   . NSTEMI (non-ST elevated myocardial infarction) ([place])   . Pneumonia 2002  . Type II diabetes mellitus ([place])     Prior to Admission medications   Medication Sig Start Date End Date Taking? Authorizing Provider  amiodarone (PACERONE) 200 MG tablet Take 1 tablet (200 mg total) by mouth 2 (two) times daily. 02/27/16  Yes Evans Lance, MD  Blood Glucose Monitoring Suppl (ACCU-CHEK AVIVA CONNECT) w/Device KIT 1 Device by Does not apply route 3 (three) times daily. Patient taking differently: 1 Device by Does not apply route See admin instructions. Check blood sugar once daily 02/19/16  Yes Arnoldo Morale, MD  carvedilol (COREG) 3.125 MG tablet Take 1 tablet (3.125 mg total) by mouth 2 (two) times daily with a meal. 02/10/16  Yes Arnoldo Morale, MD  clopidogrel (PLAVIX) 75 MG tablet Take 1 tablet (75 mg total) by mouth daily. 02/19/16  Yes Arnoldo Morale, MD  furosemide (LASIX) 20 MG tablet Take 40 mg by mouth daily. 12/02/15  Yes Historical Provider, MD  glucose blood (ACCU-CHEK AVIVA) test strip Check Blood glucose three times daily. Patient taking differently: 1 each by Other route See admin instructions. Check blood sugar once daily 02/19/16  Yes Enobong Amao, MD  insulin aspart (NOVOLOG FLEXPEN) 100 UNIT/ML FlexPen Inject 5-10 Units into the skin 2 (two) times daily as needed for high blood sugar (CBG >140). Dose is based on size of meal 01/27/16  Yes Enobong  Jarold Song, MD  Insulin Glargine (LANTUS SOLOSTAR) 100 UNIT/ML Solostar Pen Inject 20 Units into the skin daily at 10 pm. 01/27/16  Yes Arnoldo Morale, MD  Lancet Devices [facility]) lancets Check blood glucose three times daily. Patient taking differently: 1 each by Other route See admin instructions. Check blood sugar once daily 02/19/16  Yes Arnoldo Morale, MD  potassium chloride (KLOR-CON) 20 MEQ packet Take 20 mEq by mouth every other day. Patient taking  differently: Take 20 mEq by mouth daily.  02/19/16  Yes Arnoldo Morale, MD  rosuvastatin (CRESTOR) 20 MG tablet Take 1 tablet (20 mg total) by mouth daily. 01/27/16  Yes Arnoldo Morale, MD  sacubitril-valsartan (ENTRESTO) 24-26 MG Take 1 tablet by mouth 2 (two) times daily. 02/02/16  Yes Jolaine Artist, MD  warfarin (COUMADIN) 2.5 MG tablet Take 1.25 mg by mouth daily. 1/2 tablet every day 12/17/15  Yes Historical Provider, MD  albuterol (PROVENTIL HFA;VENTOLIN HFA) 108 (90 Base) MCG/ACT inhaler Inhale 2 puffs into the lungs every 6 (six) hours as needed for wheezing or shortness of breath. 12/22/15   Arnoldo Morale, MD  digoxin (LANOXIN) 0.125 MG tablet Take 1 tablet (0.125 mg total) by mouth daily. Patient not taking: Reported on 03/18/2016 02/19/16   Arnoldo Morale, MD  metFORMIN (GLUCOPHAGE) 1000 MG tablet Take 1,000 mg by mouth daily with lunch.     Historical Provider, MD  nitroGLYCERIN (NITROSTAT) 0.4 MG SL tablet Place 1 tablet (0.4 mg total) under the tongue every 5 (five) minutes x 3 doses as needed for chest pain. 11/09/15   Almyra Deforest, PA  podofilox (CONDYLOX) 0.5 % external solution Apply topically 2 (two) times daily. For 3 days then off for 4 days; repeat every week for 4 weeks. 01/27/16   Arnoldo Morale, MD  pregabalin (LYRICA) 75 MG capsule Take 2 capsules (150 mg total) by mouth at bedtime. 03/15/16   Arnoldo Morale, MD  traMADol (ULTRAM) 50 MG tablet Take 1 tablet (50 mg total) by mouth every 6 (six) hours as needed. (pain) 01/27/16   Arnoldo Morale, MD      Allergies  Allergen Reactions  . Heparin Other (See Comments)    Will not use pork-derived products due to religious beliefs   . Lovenox [Enoxaparin Sodium] Other (See Comments)    Will not use pork-derived products due to religious beliefs   . Pork-Derived Products Other (See Comments)    Does not eat pork or pork derived products d/t religious reasons    Social History   Social History  . Marital status: Married    Spouse name: N/A  .  Number of children: N/A  . Years of education: N/A   Occupational History  . Not on file.   Social History Main Topics  . Smoking status: Never Smoker  . Smokeless tobacco: Never Used  . Alcohol use No  . Drug use: No  . Sexual activity: No   Other Topics Concern  . Not on file   Social History Narrative  . No narrative on file    Family History  Problem Relation Age of Onset  . Diabetes Mother   . Hypertension Mother   . Hyperlipidemia Mother   . Stroke Mother   . Diabetes Father   . Hypertension Father   . Stroke Father 60    PHYSICAL EXAM: Vitals:   03/21/2016 1610 03/11/2016 1630  BP: (!) 116/92 (!) 126/94  Pulse: 60 (!) 59  Resp: (!) 24 (!) 0  Temp:  Marland Kitchen)  89.8 F (32.1 C)   General:  Intubated unresponsive HEENT: normal x for ETT Neck: supple. JVP 7. Carotids 2+ bilat; no bruits. No lymphadenopathy or thryomegaly appreciated. Cor: PMI nondisplaced. Regular rate & rhythm. No rubs, gallops or murmurs. Lungs: clear Abdomen: soft, nontender, nondistended. No hepatosplenomegaly. No bruits or masses. Good bowel sounds. Extremities: no cyanosis, clubbing, rash, edema Neuro: unresponsive.   ECG: NSR with v pacing  Results for orders placed or performed during the hospital encounter of 03/12/2016 (from the past 24 hour(s))  I-stat troponin, ED     Status: None   Collection Time: 03/08/2016 10:08 AM  Result Value Ref Range   Troponin i, poc 0.07 0.00 - 0.08 ng/mL   Comment 3          I-stat Chem 8, ED     Status: Abnormal   Collection Time: 03/21/2016 10:11 AM  Result Value Ref Range   Sodium 140 135 - 145 mmol/L   Potassium 4.1 3.5 - 5.1 mmol/L   Chloride 101 101 - 111 mmol/L   BUN 25 (H) 6 - 20 mg/dL   Creatinine, Ser 1.30 (H) 0.61 - 1.24 mg/dL   Glucose, Bld 221 (H) 65 - 99 mg/dL   Calcium, Ion 1.06 (L) 1.13 - 1.30 mmol/L   TCO2 24 0 - 100 mmol/L   Hemoglobin 15.3 13.0 - 17.0 g/dL   HCT 45.0 39.0 - 52.0 %  I-stat Creatinine, ED     Status: Abnormal    Collection Time: 03/05/2016 10:13 AM  Result Value Ref Range   Creatinine, Ser 1.40 (H) 0.61 - 1.24 mg/dL  Comprehensive metabolic panel     Status: Abnormal   Collection Time: 03/12/2016 10:15 AM  Result Value Ref Range   Sodium 137 135 - 145 mmol/L   Potassium 4.1 3.5 - 5.1 mmol/L   Chloride 105 101 - 111 mmol/L   CO2 23 22 - 32 mmol/L   Glucose, Bld 232 (H) 65 - 99 mg/dL   BUN 20 6 - 20 mg/dL   Creatinine, Ser 1.55 (H) 0.61 - 1.24 mg/dL   Calcium 8.4 (L) 8.9 - 10.3 mg/dL   Total Protein 6.5 6.5 - 8.1 g/dL   Albumin 3.4 (L) 3.5 - 5.0 g/dL   AST 127 (H) 15 - 41 U/L   ALT 86 (H) 17 - 63 U/L   Alkaline Phosphatase 92 38 - 126 U/L   Total Bilirubin 1.0 0.3 - 1.2 mg/dL   GFR calc non Af Amer 49 (L) >60 mL/min   GFR calc Af Amer 57 (L) >60 mL/min   Anion gap 9 5 - 15  Brain natriuretic peptide     Status: Abnormal   Collection Time: 03/11/2016 10:15 AM  Result Value Ref Range   B Natriuretic Peptide 597.3 (H) 0.0 - 100.0 pg/mL  Troponin I     Status: Abnormal   Collection Time: 03/12/2016 10:15 AM  Result Value Ref Range   Troponin I 0.10 (HH) <0.03 ng/mL  CBC with Differential     Status: Abnormal   Collection Time: 03/09/2016 10:15 AM  Result Value Ref Range   WBC 12.3 (H) 4.0 - 10.5 K/uL   RBC 5.74 4.22 - 5.81 MIL/uL   Hemoglobin 13.2 13.0 - 17.0 g/dL   HCT 41.8 39.0 - 52.0 %   MCV 72.8 (L) 78.0 - 100.0 fL   MCH 23.0 (L) 26.0 - 34.0 pg   MCHC 31.6 30.0 - 36.0 g/dL   RDW 21.5 (H) 11.5 -  15.5 %   Platelets 233 150 - 400 K/uL   Neutrophils Relative % 61 %   Lymphocytes Relative 31 %   Monocytes Relative 5 %   Eosinophils Relative 2 %   Basophils Relative 1 %   Neutro Abs 7.6 1.7 - 7.7 K/uL   Lymphs Abs 3.8 0.7 - 4.0 K/uL   Monocytes Absolute 0.6 0.1 - 1.0 K/uL   Eosinophils Absolute 0.2 0.0 - 0.7 K/uL   Basophils Absolute 0.1 0.0 - 0.1 K/uL   RBC Morphology POLYCHROMASIA PRESENT   Protime-INR     Status: Abnormal   Collection Time: 03/08/2016 10:15 AM  Result Value Ref Range     Prothrombin Time 22.0 (H) 11.4 - 15.2 seconds   INR 1.90   I-Stat CG4 Lactic Acid, ED     Status: Abnormal   Collection Time: 03/13/2016 10:19 AM  Result Value Ref Range   Lactic Acid, Venous 5.99 (HH) 0.5 - 1.9 mmol/L   Comment NOTIFIED PHYSICIAN   I-STAT 3, arterial blood gas (G3+)     Status: Abnormal   Collection Time: 04/01/2016 11:07 AM  Result Value Ref Range   pH, Arterial 7.339 (L) 7.350 - 7.450   pCO2 arterial 36.5 35.0 - 45.0 mmHg   pO2, Arterial 162.0 (H) 80.0 - 100.0 mmHg   Bicarbonate 19.7 (L) 20.0 - 24.0 mEq/L   TCO2 21 0 - 100 mmol/L   O2 Saturation 99.0 %   Acid-base deficit 5.0 (H) 0.0 - 2.0 mmol/L   Patient temperature HIDE    Sample type ARTERIAL   I-STAT 3, arterial blood gas (G3+)     Status: Abnormal   Collection Time: 04/01/2016 11:29 AM  Result Value Ref Range   pH, Arterial 7.408 7.350 - 7.450   pCO2 arterial 36.5 35.0 - 45.0 mmHg   pO2, Arterial 237.0 (H) 80.0 - 100.0 mmHg   Bicarbonate 23.0 20.0 - 24.0 mEq/L   TCO2 24 0 - 100 mmol/L   O2 Saturation 100.0 %   Acid-base deficit 1.0 0.0 - 2.0 mmol/L   Patient temperature HIDE    Sample type ARTERIAL   MRSA PCR Screening     Status: None   Collection Time: 03/21/2016 12:48 PM  Result Value Ref Range   MRSA by PCR NEGATIVE NEGATIVE  Glucose, capillary     Status: Abnormal   Collection Time: 03/15/2016  1:13 PM  Result Value Ref Range   Glucose-Capillary 174 (H) 65 - 99 mg/dL   Comment 1 Venous Specimen   Basic metabolic panel     Status: Abnormal   Collection Time: 03/26/2016  1:45 PM  Result Value Ref Range   Sodium 135 135 - 145 mmol/L   Potassium 6.0 (H) 3.5 - 5.1 mmol/L   Chloride 107 101 - 111 mmol/L   CO2 22 22 - 32 mmol/L   Glucose, Bld 207 (H) 65 - 99 mg/dL   BUN 23 (H) 6 - 20 mg/dL   Creatinine, Ser 1.52 (H) 0.61 - 1.24 mg/dL   Calcium 7.7 (L) 8.9 - 10.3 mg/dL   GFR calc non Af Amer 50 (L) >60 mL/min   GFR calc Af Amer 58 (L) >60 mL/min   Anion gap 6 5 - 15  APTT     Status: None    Collection Time: 03/22/2016  1:45 PM  Result Value Ref Range   aPTT 34 24 - 36 seconds  Troponin I (q 6hr x 3)     Status: Abnormal   Collection  Time: 03/13/2016  1:45 PM  Result Value Ref Range   Troponin I 0.35 (HH) <0.03 ng/mL  TSH     Status: None   Collection Time: 03/07/2016  1:45 PM  Result Value Ref Range   TSH 1.491 0.350 - 4.500 uIU/mL  T4, free     Status: Abnormal   Collection Time: 03/14/2016  1:45 PM  Result Value Ref Range   Free T4 1.51 (H) 0.61 - 1.12 ng/dL  Lipid panel     Status: Abnormal   Collection Time: 03/22/2016  1:45 PM  Result Value Ref Range   Cholesterol 95 0 - 200 mg/dL   Triglycerides 103 <150 mg/dL   HDL 28 (L) >40 mg/dL   Total CHOL/HDL Ratio 3.4 RATIO   VLDL 21 0 - 40 mg/dL   LDL Cholesterol 46 0 - 99 mg/dL  Magnesium     Status: None   Collection Time: 03/12/2016  1:45 PM  Result Value Ref Range   Magnesium 1.8 1.7 - 2.4 mg/dL  Phosphorus     Status: None   Collection Time: 03/11/2016  1:45 PM  Result Value Ref Range   Phosphorus 3.9 2.5 - 4.6 mg/dL  Lactic acid, plasma     Status: Abnormal   Collection Time: 03/20/2016  2:00 PM  Result Value Ref Range   Lactic Acid, Venous 2.2 (HH) 0.5 - 1.9 mmol/L  I-STAT, chem 8     Status: Abnormal   Collection Time: 03/28/2016  2:18 PM  Result Value Ref Range   Sodium 138 135 - 145 mmol/L   Potassium 6.3 (HH) 3.5 - 5.1 mmol/L   Chloride 105 101 - 111 mmol/L   BUN 26 (H) 6 - 20 mg/dL   Creatinine, Ser 1.30 (H) 0.61 - 1.24 mg/dL   Glucose, Bld 211 (H) 65 - 99 mg/dL   Calcium, Ion 1.05 (L) 1.13 - 1.30 mmol/L   TCO2 22 0 - 100 mmol/L   Hemoglobin 14.6 13.0 - 17.0 g/dL   HCT 43.0 39.0 - 52.0 %  Glucose, capillary     Status: Abnormal   Collection Time: 03/26/2016  3:21 PM  Result Value Ref Range   Glucose-Capillary 265 (H) 65 - 99 mg/dL   Comment 1 Venous Specimen   I-STAT, chem 8     Status: Abnormal   Collection Time: 03/13/2016  3:23 PM  Result Value Ref Range   Sodium 141 135 - 145 mmol/L   Potassium 4.4 3.5  - 5.1 mmol/L   Chloride 106 101 - 111 mmol/L   BUN 20 6 - 20 mg/dL   Creatinine, Ser 1.00 0.61 - 1.24 mg/dL   Glucose, Bld 315 (H) 65 - 99 mg/dL   Calcium, Ion 0.84 (L) 1.13 - 1.30 mmol/L   TCO2 17 0 - 100 mmol/L   Hemoglobin 12.9 (L) 13.0 - 17.0 g/dL   HCT 38.0 (L) 39.0 - 52.0 %  Type and screen [place]     Status: None   Collection Time: 03/25/2016  3:39 PM  Result Value Ref Range   ABO/RH(D) O POS    Antibody Screen NEG    Sample Expiration 03/22/2016   Glucose, capillary     Status: Abnormal   Collection Time: 03/30/2016  4:01 PM  Result Value Ref Range   Glucose-Capillary 266 (H) 65 - 99 mg/dL   Comment 1 Venous Specimen   I-STAT 3, arterial blood gas (G3+)     Status: Abnormal   Collection Time: 03/24/2016  4:08 PM  Result Value Ref Range   pH, Arterial 7.397 7.350 - 7.450   pCO2 arterial 30.4 (L) 35.0 - 45.0 mmHg   pO2, Arterial 195.0 (H) 80.0 - 100.0 mmHg   Bicarbonate 18.7 (L) 20.0 - 24.0 mEq/L   TCO2 20 0 - 100 mmol/L   O2 Saturation 100.0 %   Acid-base deficit 5.0 (H) 0.0 - 2.0 mmol/L   Patient temperature HIDE    Sample type ARTERIAL    Dg Chest Port 1 View  Result Date: 03/15/2016 CLINICAL DATA:  Central catheter placement EXAM: PORTABLE CHEST 1 VIEW COMPARISON:  Study obtained earlier in the day FINDINGS: Central catheter tip is in the superior vena cava. Nasogastric tube tip and side port are below the diaphragm. Endotracheal tube tip is 5.4 cm above the carina. Pacemaker leads are attached to the right atrium, right ventricle, and left ventricle, stable. No pneumothorax. There is atelectatic change in the left base. Lungs elsewhere clear. There is cardiomegaly. The pulmonary vascularity is within normal limits. No adenopathy. IMPRESSION: Tube and catheter positions as described without pneumothorax. Stable cardiomegaly. Left base atelectasis, unchanged. No new opacity. Electronically Signed   By: Lowella Grip III M.D.   On: 03/23/2016 14:10    Dg Chest Port 1 View  Result Date: 03/07/2016 CLINICAL DATA:  Evaluate endotracheal tube placement. EXAM: PORTABLE CHEST 1 VIEW COMPARISON:  01/12/2016 FINDINGS: Endotracheal tube is 4.5 cm above the carina. The cardiac silhouette is enlarged. There is a left-sided biventricular ICD. Haziness and increased densities in the perihilar regions are suggestive for vascular congestion and edema. Negative for a pneumothorax. IMPRESSION: Endotracheal tube is appropriately positioned. Cardiomegaly with central vascular congestion and mild edema. Electronically Signed   By: Markus Daft M.D.   On: 03/28/2016 10:30    ASSESSMENT: 1. VT/VF arrest 2. Acute respiratory failure 3. Chronic systolic HF due to iCM EF 20-25% 4. DM2   PLAN/DISCUSSION:  Suspect primary VT/VF arrest but given severe CAD with recent PCI will take to cath lab for emergent angiography. Will begin cooling process as well. Will need to continue Plavix. Supp K and Mag as needed.   I directed his care in the ER and Discussed with interventional team and CCM personally. ICD interrogated personally with help of ST. Jude rep.   The patient is critically ill with multiple organ systems failure and requires high complexity decision making for assessment and support, frequent evaluation and titration of therapies, application of advanced monitoring technologies and extensive interpretation of multiple databases.   Critical Care Time devoted to patient care services described in this note is 60 Minutes.   Bensimhon, Daniel,MD 4:51 PM

## 2016-03-19 NOTE — Progress Notes (Signed)
Pharmacy Antibiotic Note  Benjamin [name] is a 55 y.o. male admitted on 03/09/2016 with cardiac arrest, now on hypothermia protocol.  Pharmacy has been consulted for unasyn dosing to rule out aspiration pneumonia. Renal insufficiency noted with sCr 1.55 and CrCl 60 ml/min.  Plan: 1) Unasyn 3g IV q8 2) Follow renal function, cultures, LOT  Height: 5\' 7"  (170.2 cm) Weight: 211 lb (95.7 kg) IBW/kg (Calculated) : 66.1  Temp (24hrs), Avg:98.6 F (37 C), Min:98.4 F (36.9 C), Max:98.7 F (37.1 C)   Recent Labs Lab 03/10/2016 1011 03/04/2016 1013 03/25/2016 1015 03/30/2016 1019  WBC  --   --  12.3*  --   CREATININE 1.30* 1.40* 1.55*  --   LATICACIDVEN  --   --   --  5.99*    Estimated Creatinine Clearance: 60 mL/min (by C-G formula based on SCr of 1.55 mg/dL).    Allergies  Allergen Reactions  . Heparin Other (See Comments)    Will not use pork-derived products due to religious beliefs   . Lovenox [Enoxaparin Sodium] Other (See Comments)    Will not use pork-derived products due to religious beliefs   . Pork-Derived Products Other (See Comments)    Does not eat pork or pork derived products d/t religious reasons    Antimicrobials this admission: 8/18 Unasyn>>  Dose adjustments this admission: n/a  Microbiology results: 8/18 blood x 1 >>  Thank you for allowing pharmacy to be a part of this patient's care.  Fredrik Rigger 03/03/2016 12:26 PM

## 2016-03-19 NOTE — ED Notes (Addendum)
Pt packed with ice and cold saline infusing CCM paged

## 2016-03-19 NOTE — Progress Notes (Signed)
eLink Physician-Brief Progress Note Patient Name: Benjamin [name] DOB: 03-04-1961 MRN: [id number]   Date of Service  03/07/2016  HPI/Events of Note  K was 6.3. Repeat K is 4.4 Unable to get a line.  Pt is not on pressors. Lactic acid is improving  eICU Interventions  Follow BMP Monitor cuff pressures. No need for a line at present.     Intervention Category Evaluation Type: Other  [name] 03/11/2016, 4:08 PM

## 2016-03-19 NOTE — Progress Notes (Signed)
RT note- Arterial line attempted 3 times, unable to thread catheter over, good blood flow intialy but not fully inserted. RN notified and aware.

## 2016-03-19 NOTE — Consult Note (Signed)
PULMONARY / CRITICAL CARE MEDICINE   Name: Benjamin [name] MRN: [id number] DOB: 01-07-1961    ADMISSION DATE:  03/18/2016 CONSULTATION DATE:  03/10/2016  REFERRING MD:  Julianne Rice, M.D. / EDP  CHIEF COMPLAINT:  Post Arrest  HISTORY OF PRESENT ILLNESS:  Per records patient was a witnessed arrest with CPR initiated by his son. On arrival patient was in PEA. Patient received approximately 12 minutes of CPR total as well as bolus epinephrine. Patient lost his pulse in the midst of transport to the emergency department but regained it prior to arrival. Rhythm was paced upon arrival in the emergency department. Endotracheal tube placed by EMS. Family confirm the patient did not have any purposeful movement or open his eyes at all during his resuscitation at home. Additionally his son noted that his AICD did fire as he was doing CPR. The wife found him laying on the bed, unresponsive, with abnormal respirations. She promptly called 911 & her son started CPR. PCCM was consulted for vent & medical management. Therapeutic hypothermia was initiated in the ED prior to going to the cath lab. The patient received 1L of iced saline. Patient did receive bolus bicarb during heart cath.  PAST MEDICAL HISTORY :  Past Medical History:  Diagnosis Date  . AICD (automatic cardioverter/defibrillator) present   . Cholelithiasis   . Chronic systolic heart failure ([place])   . Coronary artery disease    distal vessel disease  . GERD (gastroesophageal reflux disease)   . High cholesterol   . Hypertension   . Kidney stone   . NSTEMI (non-ST elevated myocardial infarction) ([place])   . Pneumonia 2002  . Type II diabetes mellitus ([place])     PAST SURGICAL HISTORY: Past Surgical History:  Procedure Laterality Date  . CARDIAC CATHETERIZATION N/A 10/07/2015   Procedure: Left Heart Cath and Coronary Angiography;  Surgeon: Belva Crome, MD;  Location: [facility] [facility];  Service: Cardiovascular;  Laterality: N/A;  . CARDIAC  CATHETERIZATION N/A 11/05/2015   Procedure: Coronary Stent Intervention Rotablater;  Surgeon: Belva Crome, MD;  Location: [facility];  Service: Cardiovascular;  Laterality: N/A;  . CHOLECYSTECTOMY  10/09/2015   Procedure: LAPAROSCOPIC CHOLECYSTECTOMY;  Surgeon: Fanny Skates, MD;  Location: [facility];  Service: General;;  . COLONOSCOPY N/A 07/12/2013   Procedure: COLONOSCOPY;  Surgeon: Lear Ng, MD;  Location: [facility];  Service: Endoscopy;  Laterality: N/A;  . ELECTROPHYSIOLOGIC STUDY N/A 01/23/2016   Procedure: A-Flutter/A-Tach/SVT Ablation;  Surgeon: Evans Lance, MD;  Location: [facility];  Service: Cardiovascular;  Laterality: N/A;  . EP IMPLANTABLE DEVICE N/A 05/09/2015   Procedure: BiV ICD Insertion CRT-D;  Surgeon: Evans Lance, MD;  Location: [facility];  Service: Cardiovascular;  Laterality: N/A;     Allergies  Allergen Reactions  . Heparin Other (See Comments)    Will not use pork-derived products due to religious beliefs   . Lovenox [Enoxaparin Sodium] Other (See Comments)    Will not use pork-derived products due to religious beliefs   . Pork-Derived Products Other (See Comments)    Does not eat pork or pork derived products d/t religious reasons    No current facility-administered medications on file prior to encounter.    Current Outpatient Prescriptions on File Prior to Encounter  Medication Sig  . Blood Glucose Monitoring Suppl (ACCU-CHEK AVIVA CONNECT) w/Device KIT 1 Device by Does not apply route 3 (three) times daily. (Patient taking differently: 1 Device by Does not  apply route See admin instructions. Check blood sugar once daily)  . glucose blood (ACCU-CHEK AVIVA) test strip Check Blood glucose three times daily. (Patient taking differently: 1 each by Other route See admin instructions. Check blood sugar once daily)  . Lancet Devices (ACCU-CHEK SOFTCLIX) lancets Check blood glucose three times daily. (Patient taking differently: 1  each by Other route See admin instructions. Check blood sugar once daily)  . albuterol (PROVENTIL HFA;VENTOLIN HFA) 108 (90 Base) MCG/ACT inhaler Inhale 2 puffs into the lungs every 6 (six) hours as needed for wheezing or shortness of breath.  Marland Kitchen amiodarone (PACERONE) 200 MG tablet Take 1 tablet (200 mg total) by mouth 2 (two) times daily.  . carvedilol (COREG) 3.125 MG tablet Take 1 tablet (3.125 mg total) by mouth 2 (two) times daily with a meal.  . clopidogrel (PLAVIX) 75 MG tablet Take 1 tablet (75 mg total) by mouth daily.  . digoxin (LANOXIN) 0.125 MG tablet Take 1 tablet (0.125 mg total) by mouth daily.  . furosemide (LASIX) 20 MG tablet Take 40 mg by mouth daily.  . insulin aspart (NOVOLOG FLEXPEN) 100 UNIT/ML FlexPen Inject 5-10 Units into the skin 2 (two) times daily as needed for high blood sugar (CBG >140). Dose is based on size of meal  . Insulin Glargine (LANTUS SOLOSTAR) 100 UNIT/ML Solostar Pen Inject 20 Units into the skin daily at 10 pm.  . metFORMIN (GLUCOPHAGE) 1000 MG tablet Take 1,000 mg by mouth daily with lunch.   . nitroGLYCERIN (NITROSTAT) 0.4 MG SL tablet Place 1 tablet (0.4 mg total) under the tongue every 5 (five) minutes x 3 doses as needed for chest pain.  . podofilox (CONDYLOX) 0.5 % external solution Apply topically 2 (two) times daily. For 3 days then off for 4 days; repeat every week for 4 weeks.  . potassium chloride (KLOR-CON) 20 MEQ packet Take 20 mEq by mouth every other day.  . pregabalin (LYRICA) 75 MG capsule Take 2 capsules (150 mg total) by mouth at bedtime.  . rosuvastatin (CRESTOR) 20 MG tablet Take 1 tablet (20 mg total) by mouth daily.  . sacubitril-valsartan (ENTRESTO) 24-26 MG Take 1 tablet by mouth 2 (two) times daily.  . traMADol (ULTRAM) 50 MG tablet Take 1 tablet (50 mg total) by mouth every 6 (six) hours as needed. (pain)  . warfarin (COUMADIN) 2.5 MG tablet Take 1.25 mg by mouth daily. 1/2 tablet every day    FAMILY HISTORY:  Family History   Problem Relation Age of Onset  . Diabetes Mother   . Hypertension Mother   . Hyperlipidemia Mother   . Stroke Mother   . Diabetes Father   . Hypertension Father   . Stroke Father 51     SOCIAL HISTORY: Social History   Social History  . Marital status: Married    Spouse name: N/A  . Number of children: N/A  . Years of education: N/A   Social History Main Topics  . Smoking status: Never Smoker  . Smokeless tobacco: Never Used  . Alcohol use No  . Drug use: No  . Sexual activity: No   Other Topics Concern  . None   Social History Narrative  . None    REVIEW OF SYSTEMS:  Unable to obtain given altered mental status & intubation.  SUBJECTIVE: As above.  VITAL SIGNS: BP 94/77   Pulse (!) 59   Temp 98.7 F (37.1 C) (Rectal)   Resp 18   Ht '5\' 7"'$  (1.702 m)  Wt 211 lb (95.7 kg)   SpO2 97%   BMI 33.05 kg/m   HEMODYNAMICS:    VENTILATOR SETTINGS: Vent Mode: PRVC FiO2 (%):  [100 %] 100 % Set Rate:  [18 bmp] 18 bmp Vt Set:  [530 mL] 530 mL PEEP:  [5 cmH20] 5 cmH20 Plateau Pressure:  [19 cmH20] 19 cmH20  INTAKE / OUTPUT: No intake/output data recorded.  PHYSICAL EXAMINATION: General:  Sedated. On ventilator. Nurses at bedside. Integument:  Warm & dry. No rash on exposed skin. No bruising. Pressure device in place over right wrist. Lymphatics:  No appreciated cervical or supraclavicular lymphadenoapthy. HEENT:  No scleral injection or icterus. Endotracheal tube in place.  Cardiovascular:  Regular rate. No edema. No appreciable JVD.  Pulmonary:  Good aeration & clear to auscultation bilaterally. Symmetric chest wall rise on ventilator. Abdomen: Soft. Normal bowel sounds. Nondistended.  Musculoskeletal:  Normal bulk. No joint deformity or effusion appreciated. Neurological:  No withdrawal to pain in extremities. Symmetric DTRs. Pupils pinpoint & symmetric. No Babinski.  Psychiatric:  Unable to assess with sedation/endotracheal tube/altered  mentation  LABS:  BMET  Recent Labs Lab 03/18/2016 1011 03/30/2016 1013  NA 140  --   K 4.1  --   CL 101  --   BUN 25*  --   CREATININE 1.30* 1.40*  GLUCOSE 221*  --     Electrolytes No results for input(s): CALCIUM, MG, PHOS in the last 168 hours.  CBC  Recent Labs Lab 03/28/2016 1011  HGB 15.3  HCT 45.0    Coag's No results for input(s): APTT, INR in the last 168 hours.  Sepsis Markers  Recent Labs Lab 03/13/2016 1019  LATICACIDVEN 5.99*    ABG No results for input(s): PHART, PCO2ART, PO2ART in the last 168 hours.  Liver Enzymes No results for input(s): AST, ALT, ALKPHOS, BILITOT, ALBUMIN in the last 168 hours.  Cardiac Enzymes No results for input(s): TROPONINI, PROBNP in the last 168 hours.  Glucose No results for input(s): GLUCAP in the last 168 hours.  Imaging No results found.   STUDIES:  PFT 12/12/15: FVC 3.06 L (68%) FEV1 2.11 L (61%) FEV1/FVC 0.69 positive bronchodilator response TLC 4.61 L (72%) RV 77% ERV 78% DLCO uncorrected 72% TTE 02/11/16: LV severely dilated. EF 15-20% w/ diffuse hypokinesis. Grade 3 diastolic dysfunction. RV mildly dilated with normal systolic function. Moderate mitral regurgitation. No aortic stenosis or regurgitation. Port CXR 8/18: Endotracheal tube in acceptable position. Suggestion of cardiomegaly. Left lower lobe opacity as well as right midlung opacity with air bronchograms suggesting consolidation.  MICROBIOLOGY: MRSA PCR 8/18 >> Tracheal Asp Ctx 8/18 >> Blood Ctx x2 8/18 >>  ANTIBIOTICS: Unasyn 8/18 >>  SIGNIFICANT EVENTS: 8/18 - Admit post arrest w/ cooling initiated & LHC  LINES/TUBES: OETT 7.0 8/18 >> Foley 8/18 >> PIV x2  DISCUSSION:  55 year old male with known DM, HTN, DLD, & Systolic CHF (EF 16-10%). PEA arrest w/ 12 min until ROSC. Initiated therapeutic hypothermia. Placing central access. Neurological prognosis remains guarded while awaiting EEG & CT Head. Awaiting cardiology recs regarding  anticoag.  ASSESSMENT / PLAN:  CARDIOVASCULAR A:  S/P PEA Arrest - 12 min downtime. Witnessed & CPR initiated by son. Shock - Likely due to medication/sedation. H/O NSTEMI/CAD H/O Systolic CHF H/O AICD Placement H/O HTN & DLD  P:  Cardiology managing Vitals per unit protocol Continuous telemetry monitoring Trending Troponin I q6hr Aspirin Supp '300mg'$  x1 Systemic anticoagulation per Cardiology  NEUROLOGIC A:   Sedation on Ventilator  Acute Encephalopathy - Likely from anoxia. No reported purposeful movements post arrest.  P:   RASS goal: Per Hypothermia Protocol 33C Hypothermia Protocol Versed gtt & IV prn Fentanyl gtt & IV prn Nimbex gtt prn shivering EEG today CT Head W/O today  PULMONARY A: Acute Hypoxic Respiratory Failure - Post arrest. Probable Aspiration - R Lung on CXR. Moderate COPD - Based on pre-bronchod spiro May 2017. Mild Restrictive Lung Disease - Based on lung vol May 2017. Likely due to cardiomyopathy.  P:   Full Vent Support SBT & WUA once rewarmed Intermittent Port CXR & ABG  RENAL A:   Acute Renal Failure Lactic Acidosis  P:   Trending UOP with Foley Monitoring electrolytes & renal function per protocol Replacing electrolytes as indicated Trending LA q6hr  GASTROINTESTINAL A:   Transaminitis - Mild. Likely due to shock.  P:   NPO Pepcid IV q12hr Trending LFTs daily  HEMATOLOGIC A:   Systemic Anticoagulation - Coumadin as outpatient. INR 1.9 on arrival. Leukocytosis - Likely reactive from arrest.  P:  Trending cell counts w/ CBC daily Stat Type & Screen INR daily Holding Coumadin SCDs  No heparin or lovenox due to religious beliefs Systemic anticoagulation per Cardiology service  INFECTIOUS A:   Probable Aspiration Pneumonia vs Pneumonitis  P:   Empiric Unasyn Day #1 Checking Tracheal Asp Ctx  ENDOCRINE A:   H/O DM Type 2    P:   Checking Hgb A1c SSI per Moderate algorithm Accu-Checks q4hr Holding home  Lantus & Metformin Checking TSH, Free T4, & Serum Cortisol Level  FAMILY  - Updates: Wife & Sons updated by Dr. Ashok Cordia 8/18.  - Inter-disciplinary family meet or Palliative Care meeting due by:  8/25  I have spent a total of  43 minutes of critical care time today caring for the patient, updating family, and reviewing the patient's electronic medical record.   Sonia Baller Ashok Cordia, M.D. [facility] Pager:  [phone] After 3pm or if no response, call [phone] 03/24/2016, 10:28 AM

## 2016-03-19 NOTE — Procedures (Signed)
Central Venous Catheter Insertion Procedure Note Benjamin [name] [id number] 10/17/1960  Procedure: Insertion of Central Venous Catheter Indications: Assessment of intravascular volume, Drug and/or fluid administration and Frequent blood sampling  Procedure Details Consent: emergent consent / medical necessity.  No family available.  Post cardiac arrest.  Time Out: Verified patient identification, verified procedure, site/side was marked, verified correct patient position, special equipment/implants available, medications/allergies/relevent history reviewed, required imaging and test results available.  Performed  Maximum sterile technique was used including antiseptics, cap, gloves, gown, hand hygiene, mask and sheet. Skin prep: Chlorhexidine; local anesthetic administered  A antimicrobial bonded/coated triple lumen catheter was placed in the right internal jugular vein using the Seldinger technique, biopatch applied, sutured in place at 16 cm.  Evaluation Blood flow good Complications: No apparent complications Patient did tolerate procedure well. Chest X-ray ordered to verify placement.  CXR: pending.   Procedure performed under direct supervision of Dr. Delton Coombes and with ultrasound guidance for real time vessel cannulation.     Benjamin Brim, NP-C [place] [place] Pgr: [phone] or if no answer [phone] 04/14/16, 2:11 PM

## 2016-03-19 NOTE — ED Triage Notes (Addendum)
Pt was a witnessed arrest. CPR started by son. On ems arrival pt was PEA 8 minutes of cpr preformed and 2mg  epi given pt lost pulses again upon being being transported 4 minutes of cpr given and 2 mg epi. On arrival to ED pt in paced rhythm hr 60 bp 138/96. EDP and cardiology at bedside. ET tube placed by ems 7.0

## 2016-03-19 NOTE — Procedures (Signed)
PCCM Procedure Note:   Attempted to cannulate & pass guidewire with ultrasound guidance into left internal jugular vein without success. Able to access the vein with ease and good, non-pulsatile, dark blood flow. However, guidewire could not be passed with ease. Needle & guidewire removed. No immediate complications. Pressure was held. Plan to attempt right internal jugular central venous catheter placement.  Donna Christen Jamison Neighbor, M.D. [facility]monary [facility] Critical Care Pager:  [phone] After 3pm or if no response, call [phone] 1:15 PM 03/11/2016

## 2016-03-19 NOTE — ED Provider Notes (Signed)
[facility] DEPT Provider Note   CSN: [id number] Arrival date & time: 03/28/2016  2831     History   Chief Complaint Chief Complaint  Patient presents with  . Cardiac Arrest    HPI Benjamin [name] is a 55 y.o. male.  The history is provided by the EMS personnel and a relative. No language interpreter was used.  Cardiac Arrest  Witnessed by:  Family member Incident location:  Home Time since incident: pta. Time before BLS initiated:  Immediate Time before ALS initiated:  > 10 minutes Condition upon EMS arrival:  Unresponsive Pulse:  Absent Initial cardiac rhythm per EMS:  Paced (pulseless) Treatments prior to arrival:  ACLS protocol, intubation and vascular access Medications given prior to ED:  Epinephrine IV access type:  Peripheral Airway:  Intubation prior to arrival Rhythm on admission to ED:  Paced (with a pulse) Associated symptoms: loss of consciousness and syncope   Associated symptoms: no chest pain, no difficulty breathing, no dizziness, no palpitations, no shortness of breath, no vomiting and no weakness   Syncope:    LOC duration: almost an hour.   Witnessed: yes     Suspicion of head trauma:  No Risk factors: diabetes mellitus, heart problem and hyperlipidemia   Risk factors: no COPD, no drug overdose, no head injury and no trauma     Past Medical History:  Diagnosis Date  . AICD (automatic cardioverter/defibrillator) present   . Cholelithiasis   . Chronic systolic heart failure ([place])   . Coronary artery disease    distal vessel disease  . GERD (gastroesophageal reflux disease)   . High cholesterol   . Hypertension   . Kidney stone   . NSTEMI (non-ST elevated myocardial infarction) ([place])   . Pneumonia 2002  . Type II diabetes mellitus [facility])     Patient Active Problem List   Diagnosis Date Noted  . Acute on chronic systolic HF (heart failure) ([place]) 01/22/2016  . Atypical atrial flutter ([place])   . Obstructive airway disease ([place]) 12/22/2015  .  Cardiomyopathy, ischemic 11/05/2015  . Atrial flutter with rapid ventricular response ([place]) 11/04/2015  . NSTEMI (non-ST elevated myocardial infarction) ([place]) 11/03/2015  . Coronary artery disease involving native coronary artery of native heart without angina pectoris   . Cholecystitis 10/06/2015  . Neuropathy ([place]) 10/06/2015  . Acute on chronic systolic heart failure ([place]) 10/06/2015  . Preoperative clearance 10/06/2015  . Preop cardiovascular exam 10/06/2015  . S/P ICD (internal cardiac defibrillator) procedure 05/09/2015  . Chronic systolic congestive heart failure ([place]) 08/21/2014  . Hypertensive heart disease 08/21/2014  . Dyspnea 07/22/2014  . Diabetic foot ([place]) 04/18/2014  . Erectile dysfunction associated with type 2 diabetes mellitus ([place]) 11/05/2013  . Seasonal allergies 11/05/2013  . Diabetes ([place]) 08/07/2013  . DM (diabetes mellitus) type 2, uncontrolled, with ketoacidosis ([place]) 08/07/2013  . Dyslipidemia 08/07/2013  . GERD (gastroesophageal reflux disease) 08/07/2013  . Change in bowel habits 07/12/2013  . Essential hypertension 04/18/2013  . Hyperlipidemia with target LDL less than 100 04/18/2013  . Constipation 04/18/2013  . Insulin dependent type 2 diabetes mellitus, uncontrolled ([place]) 04/18/2013    Past Surgical History:  Procedure Laterality Date  . CARDIAC CATHETERIZATION N/A 10/07/2015   Procedure: Left Heart Cath and Coronary Angiography;  Surgeon: Belva Crome, MD;  Location: [place] CV LAB;  Service: Cardiovascular;  Laterality: N/A;  . CARDIAC CATHETERIZATION N/A 11/05/2015   Procedure: Coronary Stent Intervention Rotablater;  Surgeon: Belva Crome, MD;  Location: [place]  CV LAB;  Service: Cardiovascular;  Laterality: N/A;  . CHOLECYSTECTOMY  10/09/2015   Procedure: LAPAROSCOPIC CHOLECYSTECTOMY;  Surgeon: Fanny Skates, MD;  Location: [facility];  Service: General;;  . COLONOSCOPY N/A 07/12/2013   Procedure: COLONOSCOPY;  Surgeon: Lear Ng,  MD;  Location: [facility];  Service: Endoscopy;  Laterality: N/A;  . ELECTROPHYSIOLOGIC STUDY N/A 01/23/2016   Procedure: A-Flutter/A-Tach/SVT Ablation;  Surgeon: Evans Lance, MD;  Location: [place] CV LAB;  Service: Cardiovascular;  Laterality: N/A;  . EP IMPLANTABLE DEVICE N/A 05/09/2015   Procedure: BiV ICD Insertion CRT-D;  Surgeon: Evans Lance, MD;  Location: [place] CV LAB;  Service: Cardiovascular;  Laterality: N/A;       Home Medications    Prior to Admission medications   Medication Sig Start Date End Date Taking? Authorizing Provider  Blood Glucose Monitoring Suppl (ACCU-CHEK AVIVA CONNECT) w/Device KIT 1 Device by Does not apply route 3 (three) times daily. Patient taking differently: 1 Device by Does not apply route See admin instructions. Check blood sugar once daily 02/19/16  Yes Enobong Amao, MD  glucose blood (ACCU-CHEK AVIVA) test strip Check Blood glucose three times daily. Patient taking differently: 1 each by Other route See admin instructions. Check blood sugar once daily 02/19/16  Yes Arnoldo Morale, MD  Lancet Devices [facility]) lancets Check blood glucose three times daily. Patient taking differently: 1 each by Other route See admin instructions. Check blood sugar once daily 02/19/16  Yes Arnoldo Morale, MD  albuterol (PROVENTIL HFA;VENTOLIN HFA) 108 (90 Base) MCG/ACT inhaler Inhale 2 puffs into the lungs every 6 (six) hours as needed for wheezing or shortness of breath. 12/22/15   Arnoldo Morale, MD  amiodarone (PACERONE) 200 MG tablet Take 1 tablet (200 mg total) by mouth 2 (two) times daily. 02/27/16   Evans Lance, MD  carvedilol (COREG) 3.125 MG tablet Take 1 tablet (3.125 mg total) by mouth 2 (two) times daily with a meal. 02/10/16   Arnoldo Morale, MD  clopidogrel (PLAVIX) 75 MG tablet Take 1 tablet (75 mg total) by mouth daily. 02/19/16   Arnoldo Morale, MD  digoxin (LANOXIN) 0.125 MG tablet Take 1 tablet (0.125 mg total) by mouth daily. 02/19/16    Arnoldo Morale, MD  furosemide (LASIX) 20 MG tablet Take 40 mg by mouth daily. 12/02/15   Historical Provider, MD  insulin aspart (NOVOLOG FLEXPEN) 100 UNIT/ML FlexPen Inject 5-10 Units into the skin 2 (two) times daily as needed for high blood sugar (CBG >140). Dose is based on size of meal 01/27/16   Arnoldo Morale, MD  Insulin Glargine (LANTUS SOLOSTAR) 100 UNIT/ML Solostar Pen Inject 20 Units into the skin daily at 10 pm. 01/27/16   Arnoldo Morale, MD  metFORMIN (GLUCOPHAGE) 1000 MG tablet Take 1,000 mg by mouth daily with lunch.     Historical Provider, MD  nitroGLYCERIN (NITROSTAT) 0.4 MG SL tablet Place 1 tablet (0.4 mg total) under the tongue every 5 (five) minutes x 3 doses as needed for chest pain. 11/09/15   Almyra Deforest, PA  podofilox (CONDYLOX) 0.5 % external solution Apply topically 2 (two) times daily. For 3 days then off for 4 days; repeat every week for 4 weeks. 01/27/16   Arnoldo Morale, MD  potassium chloride (KLOR-CON) 20 MEQ packet Take 20 mEq by mouth every other day. 02/19/16   Arnoldo Morale, MD  pregabalin (LYRICA) 75 MG capsule Take 2 capsules (150 mg total) by mouth at bedtime. 03/15/16   Arnoldo Morale, MD  rosuvastatin (CRESTOR) 20 MG tablet Take 1 tablet (20 mg total) by mouth daily. 01/27/16   Arnoldo Morale, MD  sacubitril-valsartan (ENTRESTO) 24-26 MG Take 1 tablet by mouth 2 (two) times daily. 02/02/16   Jolaine Artist, MD  traMADol (ULTRAM) 50 MG tablet Take 1 tablet (50 mg total) by mouth every 6 (six) hours as needed. (pain) 01/27/16   Arnoldo Morale, MD  warfarin (COUMADIN) 2.5 MG tablet Take 1.25 mg by mouth daily. 1/2 tablet every day 12/17/15   Historical Provider, MD    Family History Family History  Problem Relation Age of Onset  . Diabetes Mother   . Hypertension Mother   . Hyperlipidemia Mother   . Stroke Mother   . Diabetes Father   . Hypertension Father   . Stroke Father 69    Social History Social History  Substance Use Topics  . Smoking status: Never Smoker  .  Smokeless tobacco: Never Used  . Alcohol use No     Allergies   Heparin; Lovenox [enoxaparin sodium]; and Pork-derived products   Review of Systems Review of Systems  Unable to perform ROS: Intubated  Respiratory: Negative for shortness of breath.   Cardiovascular: Positive for syncope. Negative for chest pain and palpitations.  Gastrointestinal: Negative for vomiting.  Neurological: Positive for loss of consciousness. Negative for dizziness and weakness.     Physical Exam Updated Vital Signs BP 94/77   Pulse (!) 59   Temp 98.7 F (37.1 C) (Rectal)   Resp 18   Ht '5\' 7"'$  (1.702 m)   Wt 95.7 kg   SpO2 97%   BMI 33.05 kg/m   Physical Exam  Constitutional: He appears well-developed and well-nourished. No distress.  unconscious  HENT:  Head: Normocephalic and atraumatic.  Right Ear: External ear normal.  Left Ear: External ear normal.  Nose: Nose normal.  Mouth/Throat: Oropharynx is clear and moist.  NPA in place, no blood in oropharynx. 7.0 tube in place  Eyes: Conjunctivae are normal. No scleral icterus.  Pupils constricted and minimally responsive to light  Neck: Normal range of motion. Neck supple. No tracheal deviation present.  Cardiovascular: Normal rate, regular rhythm, normal heart sounds and intact distal pulses.   No murmur heard. Pulmonary/Chest: Effort normal and breath sounds normal. No stridor. No respiratory distress. He has no wheezes. He has no rales.  Abdominal: Soft. He exhibits no distension. There is no tenderness. There is no rebound and no guarding.  Musculoskeletal: He exhibits no edema, tenderness or deformity.  Neurological: He is unresponsive. GCS eye subscore is 1. GCS verbal subscore is 1. GCS motor subscore is 1.  No sedation in the field  Skin: Skin is warm and dry. Capillary refill takes less than 2 seconds. He is not diaphoretic.  Nursing note and vitals reviewed.    ED Treatments / Results  Labs (all labs ordered are listed, but  only abnormal results are displayed) Labs Reviewed  COMPREHENSIVE METABOLIC PANEL - Abnormal; Notable for the following:       Result Value   Glucose, Bld 232 (*)    Creatinine, Ser 1.55 (*)    Calcium 8.4 (*)    Albumin 3.4 (*)    AST 127 (*)    ALT 86 (*)    GFR calc non Af Amer 49 (*)    GFR calc Af Amer 57 (*)    All other components within normal limits  BRAIN NATRIURETIC PEPTIDE - Abnormal; Notable for the following:    B  Natriuretic Peptide 597.3 (*)    All other components within normal limits  TROPONIN I - Abnormal; Notable for the following:    Troponin I 0.10 (*)    All other components within normal limits  CBC WITH DIFFERENTIAL/PLATELET - Abnormal; Notable for the following:    WBC 12.3 (*)    MCV 72.8 (*)    MCH 23.0 (*)    RDW 21.5 (*)    All other components within normal limits  PROTIME-INR - Abnormal; Notable for the following:    Prothrombin Time 22.0 (*)    All other components within normal limits  BASIC METABOLIC PANEL - Abnormal; Notable for the following:    Potassium 6.0 (*)    Glucose, Bld 207 (*)    BUN 23 (*)    Creatinine, Ser 1.52 (*)    Calcium 7.7 (*)    GFR calc non Af Amer 50 (*)    GFR calc Af Amer 58 (*)    All other components within normal limits  LACTIC ACID, PLASMA - Abnormal; Notable for the following:    Lactic Acid, Venous 2.2 (*)    All other components within normal limits  LACTIC ACID, PLASMA - Abnormal; Notable for the following:    Lactic Acid, Venous 2.0 (*)    All other components within normal limits  TROPONIN I - Abnormal; Notable for the following:    Troponin I 0.35 (*)    All other components within normal limits  TROPONIN I - Abnormal; Notable for the following:    Troponin I 0.26 (*)    All other components within normal limits  T4, FREE - Abnormal; Notable for the following:    Free T4 1.51 (*)    All other components within normal limits  LIPID PANEL - Abnormal; Notable for the following:    HDL 28 (*)     All other components within normal limits  BASIC METABOLIC PANEL - Abnormal; Notable for the following:    Glucose, Bld 250 (*)    BUN 25 (*)    Creatinine, Ser 1.41 (*)    Calcium 8.0 (*)    GFR calc non Af Amer 55 (*)    All other components within normal limits  APTT - Abnormal; Notable for the following:    aPTT 37 (*)    All other components within normal limits  GLUCOSE, CAPILLARY - Abnormal; Notable for the following:    Glucose-Capillary 174 (*)    All other components within normal limits  GLUCOSE, CAPILLARY - Abnormal; Notable for the following:    Glucose-Capillary 265 (*)    All other components within normal limits  GLUCOSE, CAPILLARY - Abnormal; Notable for the following:    Glucose-Capillary 266 (*)    All other components within normal limits  GLUCOSE, CAPILLARY - Abnormal; Notable for the following:    Glucose-Capillary 270 (*)    All other components within normal limits  GLUCOSE, CAPILLARY - Abnormal; Notable for the following:    Glucose-Capillary 174 (*)    All other components within normal limits  GLUCOSE, CAPILLARY - Abnormal; Notable for the following:    Glucose-Capillary 238 (*)    All other components within normal limits  PROTIME-INR - Abnormal; Notable for the following:    Prothrombin Time 23.3 (*)    All other components within normal limits  GLUCOSE, CAPILLARY - Abnormal; Notable for the following:    Glucose-Capillary 191 (*)    All other components within normal limits  GLUCOSE, CAPILLARY - Abnormal; Notable for the following:    Glucose-Capillary 186 (*)    All other components within normal limits  I-STAT CHEM 8, ED - Abnormal; Notable for the following:    BUN 25 (*)    Creatinine, Ser 1.30 (*)    Glucose, Bld 221 (*)    Calcium, Ion 1.06 (*)    All other components within normal limits  I-STAT CREATININE, ED - Abnormal; Notable for the following:    Creatinine, Ser 1.40 (*)    All other components within normal limits  I-STAT CG4  LACTIC ACID, ED - Abnormal; Notable for the following:    Lactic Acid, Venous 5.99 (*)    All other components within normal limits  POCT I-STAT 3, ART BLOOD GAS (G3+) - Abnormal; Notable for the following:    pH, Arterial 7.339 (*)    pO2, Arterial 162.0 (*)    Bicarbonate 19.7 (*)    Acid-base deficit 5.0 (*)    All other components within normal limits  POCT I-STAT 3, ART BLOOD GAS (G3+) - Abnormal; Notable for the following:    pO2, Arterial 237.0 (*)    All other components within normal limits  POCT I-STAT, CHEM 8 - Abnormal; Notable for the following:    Potassium 6.3 (*)    BUN 26 (*)    Creatinine, Ser 1.30 (*)    Glucose, Bld 211 (*)    Calcium, Ion 1.05 (*)    All other components within normal limits  POCT I-STAT, CHEM 8 - Abnormal; Notable for the following:    Glucose, Bld 315 (*)    Calcium, Ion 0.84 (*)    Hemoglobin 12.9 (*)    HCT 38.0 (*)    All other components within normal limits  POCT I-STAT 3, ART BLOOD GAS (G3+) - Abnormal; Notable for the following:    pCO2 arterial 30.4 (*)    pO2, Arterial 195.0 (*)    Bicarbonate 18.7 (*)    Acid-base deficit 5.0 (*)    All other components within normal limits  POCT I-STAT, CHEM 8 - Abnormal; Notable for the following:    BUN 21 (*)    Glucose, Bld 220 (*)    Calcium, Ion 0.91 (*)    Hemoglobin 12.9 (*)    HCT 38.0 (*)    All other components within normal limits  MRSA PCR SCREENING  CULTURE, BLOOD (ROUTINE X 2)  CULTURE, BLOOD (ROUTINE X 2)  CULTURE, RESPIRATORY (NON-EXPECTORATED)  APTT  TSH  MAGNESIUM  PHOSPHORUS  HEMOGLOBIN A1C  BLOOD GAS, ARTERIAL  LACTIC ACID, PLASMA  TROPONIN I  PROTIME-INR  HEPATIC FUNCTION PANEL  PHOSPHORUS  MAGNESIUM  DIGOXIN LEVEL  BASIC METABOLIC PANEL  BASIC METABOLIC PANEL  BASIC METABOLIC PANEL  BASIC METABOLIC PANEL  BASIC METABOLIC PANEL  BASIC METABOLIC PANEL  I-STAT TROPOININ, ED  TYPE AND SCREEN  ABO/RH    EKG  EKG Interpretation None        Radiology Dg Chest Port 1 View  Result Date: 03/31/2016 CLINICAL DATA:  Central catheter placement EXAM: PORTABLE CHEST 1 VIEW COMPARISON:  Study obtained earlier in the day FINDINGS: Central catheter tip is in the superior vena cava. Nasogastric tube tip and side port are below the diaphragm. Endotracheal tube tip is 5.4 cm above the carina. Pacemaker leads are attached to the right atrium, right ventricle, and left ventricle, stable. No pneumothorax. There is atelectatic change in the left base. Lungs elsewhere clear. There is cardiomegaly. The  pulmonary vascularity is within normal limits. No adenopathy. IMPRESSION: Tube and catheter positions as described without pneumothorax. Stable cardiomegaly. Left base atelectasis, unchanged. No new opacity. Electronically Signed   By: Lowella Grip III M.D.   On: 03/23/2016 14:10   Dg Chest Port 1 View  Result Date: 03/07/2016 CLINICAL DATA:  Evaluate endotracheal tube placement. EXAM: PORTABLE CHEST 1 VIEW COMPARISON:  01/12/2016 FINDINGS: Endotracheal tube is 4.5 cm above the carina. The cardiac silhouette is enlarged. There is a left-sided biventricular ICD. Haziness and increased densities in the perihilar regions are suggestive for vascular congestion and edema. Negative for a pneumothorax. IMPRESSION: Endotracheal tube is appropriately positioned. Cardiomegaly with central vascular congestion and mild edema. Electronically Signed   By: Markus Daft M.D.   On: 03/17/2016 10:30   Ct Portable Head W/o Cm  Result Date: 03/05/2016 CLINICAL DATA:  Status post cardiac arrest today for 8 minutes. EXAM: CT HEAD WITHOUT CONTRAST TECHNIQUE: Contiguous axial images were obtained from the base of the skull through the vertex without intravenous contrast. COMPARISON:  05/21/2008. FINDINGS: Study was performed with a portable apparatus. Image quality is reduced. Brain: There is a area of 27 x 16 mm hypoattenuation in the RIGHT occipital lobe, uncertain  significance and duration. This was not present in 2009, but image quality is insufficient to distinguish between an acute versus chronic infarct. Hypoattenuation due to a mass is less likely. Poor gray-white differentiation throughout the cerebral hemispheres. Anoxic- ischemic injury is not excluded. No hemorrhage, hydrocephalus, or extra-axial fluid. Mild atrophy, premature for age. Vascular: No hyperdense vessel.  Carotid siphon calcification. Skull: Negative Sinuses/Orbits: Negative Other: None. IMPRESSION: Poor gray-white differentiation throughout the cerebral hemispheres. Anoxic ischemic injury is not excluded. Superficial RIGHT occipital lobe area of hypoattenuation, approximately the 3 x 1.5 cm, could represent an acute or chronic infarct. Continued surveillance warranted. No intracranial hemorrhage or large vessel occlusion. Electronically Signed   By: Staci Righter M.D.   On: 03/22/2016 20:16    Procedures Procedures (including critical care time)  Medications Ordered in ED Medications  norepinephrine (LEVOPHED) 4 mg in dextrose 5 % 250 mL (0.016 mg/mL) infusion (not administered)  NaCl 500cc only due to poor EF Rectal ASA prior to Cath '300mg'$  suppository  Initial Impression / Assessment and Plan / ED Course  I have reviewed the triage vital signs and the nursing notes.  Pertinent labs & imaging results that were available during my care of the patient were reviewed by me and considered in my medical decision making (see chart for details).  Clinical Course   Patient is a 55 year old male past medical history of coronary artery disease (high-grade obstruction in the mid LAD and distal RCA), hyperlipidemia, hypertension, type 2 diabetes with a prior NSTEMI and ICD placed due to heart failure, with an EF of 20%, who presents to the emergency department after a cardiac arrest.  Patient was intubated in the field.  Bilateral breath sounds on arrival.  Not hypoxic on ventilator.  Patient had  a perfusing paced rhythm on arrival.  Cardiology was called to evaluate patient.  Due to history of coronary disease device was urgently interrogated in order to identify initial rhythm.  Patient was found to have been in ventricular fibrillation. EMS reportedly had defibrillated him to a perfusing paced rhythm after receiving 2 rounds of epi as well. As a  Result, decision was made by cardiology to take patient to the cath lab.  Critical care was consulted as well to continue hypothermia protocol  that was initiated in the field.  They requested central line placement due to likely pressor use while inpatient, however cardiology wanted patient be admitted to Cath Lab urgently, so will defer central line placement until Cardiac cath complete.  Patient did not have any episode of pulselessness or arrhythmia while in the emergency department.  Hemodynamically stable.  No need for pressors in the ED.  Ventilating adequately.  Patient was given small normal saline bolus due to history of heart failure and rectal aspirin ordered prior to cath. Family updated by all teams involved. All questions answered. Stated understanding and agreement with plan.   Final Clinical Impressions(s) / ED Diagnoses   Final diagnoses:  Cardiac arrest [facility])    New Prescriptions New Prescriptions   No medications on file     Darlina Rumpf, MD 03/20/16 2037    Julianne Rice, MD 03/21/16 1620

## 2016-03-19 NOTE — ED Notes (Signed)
Paged chaplain. Family put in consultation room.

## 2016-03-19 NOTE — ED Notes (Signed)
[employer] at bedside.

## 2016-03-19 NOTE — Care Management Note (Signed)
Case Management Note  Patient Details  Name: Benjamin [name] MRN: [id number] Date of Birth: [birth year]  Subjective/Objective:      Adm w pea arrest, on vent              Action/Plan:lives w fam, pcp dr Hyman Hopes   Expected Discharge Date:                  Expected Discharge Plan:     In-House Referral:     Discharge planning Services  CM Consult  Post Acute Care Choice:    Choice offered to:     DME Arranged:    DME Agency:     HH Arranged:    HH Agency:     Status of Service:  In process, will continue to follow  If discussed at Long Length of Stay Meetings, dates discussed:    Additional Comments: on vent  Hanley Hays, RN 03/15/2016, 1:52 PM

## 2016-03-19 NOTE — Progress Notes (Signed)
ANTICOAGULATION CONSULT NOTE - Initial Consult  Pharmacy Consult for Coumadin / Angiomax Indication: atrial flutter  Allergies  Allergen Reactions  . Heparin Other (See Comments)    Will not use pork-derived products due to religious beliefs   . Lovenox [Enoxaparin Sodium] Other (See Comments)    Will not use pork-derived products due to religious beliefs   . Pork-Derived Products Other (See Comments)    Does not eat pork or pork derived products d/t religious reasons    Patient Measurements: Height: 5\' 7"  (170.2 cm) Weight: 211 lb (95.7 kg) IBW/kg (Calculated) : 66.1  Vital Signs: Temp: 93 F (33.9 C) (08/18 2000) Temp Source: Core (Comment) (08/18 2000) BP: 107/76 (08/18 2000) Pulse Rate: 58 (08/18 2000)  Labs:  Recent Labs  04/05/2016 1015 2016/04/05 1345 04/05/16 1418 Apr 05, 2016 1523 04/05/2016 1817 05-Apr-2016 2002 04-05-16 2003  HGB 13.2  --  14.6 12.9* 12.9*  --   --   HCT 41.8  --  43.0 38.0* 38.0*  --   --   PLT 233  --   --   --   --   --   --   APTT  --  34  --   --   --   --  37*  LABPROT 22.0*  --   --   --   --  23.3*  --   INR 1.90  --   --   --   --  2.03  --   CREATININE 1.55* 1.52* 1.30* 1.00 0.90  --  1.41*  TROPONINI 0.10* 0.35*  --   --   --   --  0.26*    Estimated Creatinine Clearance: 66 mL/min (by C-G formula based on SCr of 1.41 mg/dL).   Medical History: Past Medical History:  Diagnosis Date  . AICD (automatic cardioverter/defibrillator) present   . Cholelithiasis   . Chronic systolic heart failure (HCC)   . Coronary artery disease    distal vessel disease  . GERD (gastroesophageal reflux disease)   . High cholesterol   . Hypertension   . Kidney stone   . NSTEMI (non-ST elevated myocardial infarction) (HCC)   . Pneumonia 2002  . Type II diabetes mellitus (HCC)    Assessment: 55yom on coumadin pta for aflutter, admitted s/p cardiac arrest and now on hypothermia protocol. INR 1.9 on admit and Coumadin not given 8/18 PM.   Order to  begin Angiomax at very low dose to substitute for heparin when INR < 2 INR this evening = 2.03 Currently cooled  Cannot use heparin/lovenox due to religious beliefs. CBC wnl.   PTA dose: 1.25mg  daily  Goal of Therapy:  INR 2-3 Monitor platelets by anticoagulation protocol: Yes   Plan:  1) Coumadin not given 8/18 PM / Angiomax not yet needed 2) Daily INR  Thank you Okey Regal, PharmD [phone] 05-Apr-2016,9:00 PM

## 2016-03-19 NOTE — Procedures (Signed)
History: 55 yo M s/p cardiac arrest on hypothermia protocol  Sedation: Versed  Technique: This is a 21 channel routine scalp EEG performed at the bedside with bipolar and monopolar montages arranged in accordance to the international 10/20 system of electrode placement. One channel was dedicated to EKG recording.    Background: The background is very attenuated and reviewed on a sensitivity of 3 V. It consists of low voltage irregular generalized delta activity.  Photic stimulation: Physiologic driving is not performed  EEG Abnormalities: 1) very low voltage generalized irregular delta activity  Clinical Interpretation: This EEG is consistent with a generalized non-specific cerebral dysfunction(encephalopathy). There was no seizure or seizure predisposition recorded on this study. Please note that a normal EEG does not preclude the possibility of epilepsy.   Ritta Slot, MD [facility] [phone]  If 7pm- 7am, please page neurology on call as listed in AMION.

## 2016-03-20 DIAGNOSIS — I2510 Atherosclerotic heart disease of native coronary artery without angina pectoris: Secondary | ICD-10-CM

## 2016-03-20 DIAGNOSIS — I4901 Ventricular fibrillation: Principal | ICD-10-CM

## 2016-03-20 DIAGNOSIS — Z9861 Coronary angioplasty status: Secondary | ICD-10-CM

## 2016-03-20 LAB — POCT I-STAT, CHEM 8
BUN: 24 mg/dL — ABNORMAL HIGH (ref 6–20)
BUN: 35 mg/dL — AB (ref 6–20)
BUN: 29 mg/dL — AB (ref 6–20)
BUN: 29 mg/dL — ABNORMAL HIGH (ref 6–20)
BUN: 29 mg/dL — ABNORMAL HIGH (ref 6–20)
CALCIUM ION: 1.06 mmol/L — AB (ref 1.13–1.30)
CALCIUM ION: 1.09 mmol/L — AB (ref 1.13–1.30)
CALCIUM ION: 1.12 mmol/L — AB (ref 1.13–1.30)
CHLORIDE: 108 mmol/L (ref 101–111)
CREATININE: 0.9 mg/dL (ref 0.61–1.24)
CREATININE: 0.9 mg/dL (ref 0.61–1.24)
Calcium, Ion: 1.04 mmol/L — ABNORMAL LOW (ref 1.13–1.30)
Calcium, Ion: 1.08 mmol/L — ABNORMAL LOW (ref 1.13–1.30)
Chloride: 108 mmol/L (ref 101–111)
Chloride: 105 mmol/L (ref 101–111)
Chloride: 106 mmol/L (ref 101–111)
Chloride: 104 mmol/L (ref 101–111)
Creatinine, Ser: 1 mg/dL (ref 0.61–1.24)
Creatinine, Ser: 1 mg/dL (ref 0.61–1.24)
Creatinine, Ser: 1 mg/dL (ref 0.61–1.24)
GLUCOSE: 140 mg/dL — AB (ref 65–99)
GLUCOSE: 136 mg/dL — AB (ref 65–99)
Glucose, Bld: 146 mg/dL — ABNORMAL HIGH (ref 65–99)
Glucose, Bld: 143 mg/dL — ABNORMAL HIGH (ref 65–99)
Glucose, Bld: 134 mg/dL — ABNORMAL HIGH (ref 65–99)
HCT: 44 % (ref 39.0–52.0)
HCT: 45 % (ref 39.0–52.0)
HEMATOCRIT: 41 % (ref 39.0–52.0)
HEMATOCRIT: 44 % (ref 39.0–52.0)
HEMATOCRIT: 46 % (ref 39.0–52.0)
HEMOGLOBIN: 13.9 g/dL (ref 13.0–17.0)
HEMOGLOBIN: 15 g/dL (ref 13.0–17.0)
HEMOGLOBIN: 15.6 g/dL (ref 13.0–17.0)
Hemoglobin: 15 g/dL (ref 13.0–17.0)
Hemoglobin: 15.3 g/dL (ref 13.0–17.0)
POTASSIUM: 3.9 mmol/L (ref 3.5–5.1)
Potassium: 2.9 mmol/L — ABNORMAL LOW (ref 3.5–5.1)
Potassium: 7.1 mmol/L (ref 3.5–5.1)
Potassium: 4.3 mmol/L (ref 3.5–5.1)
Potassium: 3.8 mmol/L (ref 3.5–5.1)
SODIUM: 145 mmol/L (ref 135–145)
SODIUM: 138 mmol/L (ref 135–145)
SODIUM: 142 mmol/L (ref 135–145)
SODIUM: 142 mmol/L (ref 135–145)
Sodium: 141 mmol/L (ref 135–145)
TCO2: 19 mmol/L (ref 0–100)
TCO2: 22 mmol/L (ref 0–100)
TCO2: 21 mmol/L (ref 0–100)
TCO2: 21 mmol/L (ref 0–100)
TCO2: 21 mmol/L (ref 0–100)

## 2016-03-20 LAB — GLUCOSE, CAPILLARY
GLUCOSE-CAPILLARY: 132 mg/dL — AB (ref 65–99)
GLUCOSE-CAPILLARY: 134 mg/dL — AB (ref 65–99)
GLUCOSE-CAPILLARY: 135 mg/dL — AB (ref 65–99)
GLUCOSE-CAPILLARY: 138 mg/dL — AB (ref 65–99)
GLUCOSE-CAPILLARY: 142 mg/dL — AB (ref 65–99)
GLUCOSE-CAPILLARY: 142 mg/dL — AB (ref 65–99)
GLUCOSE-CAPILLARY: 143 mg/dL — AB (ref 65–99)
GLUCOSE-CAPILLARY: 168 mg/dL — AB (ref 65–99)
GLUCOSE-CAPILLARY: 52 mg/dL — AB (ref 65–99)
Glucose-Capillary: 171 mg/dL — ABNORMAL HIGH (ref 65–99)
Glucose-Capillary: 150 mg/dL — ABNORMAL HIGH (ref 65–99)
Glucose-Capillary: 141 mg/dL — ABNORMAL HIGH (ref 65–99)
Glucose-Capillary: 153 mg/dL — ABNORMAL HIGH (ref 65–99)

## 2016-03-20 LAB — MAGNESIUM: Magnesium: 1.9 mg/dL (ref 1.7–2.4)

## 2016-03-20 LAB — BASIC METABOLIC PANEL
ANION GAP: 7 (ref 5–15)
ANION GAP: 9 (ref 5–15)
BUN: 25 mg/dL — ABNORMAL HIGH (ref 6–20)
BUN: 27 mg/dL — ABNORMAL HIGH (ref 6–20)
CHLORIDE: 107 mmol/L (ref 101–111)
CHLORIDE: 109 mmol/L (ref 101–111)
CO2: 21 mmol/L — AB (ref 22–32)
CO2: 23 mmol/L (ref 22–32)
CREATININE: 1.31 mg/dL — AB (ref 0.61–1.24)
Calcium: 8 mg/dL — ABNORMAL LOW (ref 8.9–10.3)
Calcium: 8.1 mg/dL — ABNORMAL LOW (ref 8.9–10.3)
Creatinine, Ser: 1.36 mg/dL — ABNORMAL HIGH (ref 0.61–1.24)
GFR calc non Af Amer: 58 mL/min — ABNORMAL LOW (ref >60)
GFR calc non Af Amer: >60 mL/min (ref >60)
Glucose, Bld: 150 mg/dL — ABNORMAL HIGH (ref 65–99)
Glucose, Bld: 209 mg/dL — ABNORMAL HIGH (ref 65–99)
Potassium: 3.5 mmol/L (ref 3.5–5.1)
Potassium: 3.8 mmol/L (ref 3.5–5.1)
Sodium: 137 mmol/L (ref 135–145)
Sodium: 139 mmol/L (ref 135–145)

## 2016-03-20 LAB — HEPATIC FUNCTION PANEL
ALK PHOS: 69 U/L (ref 38–126)
ALT: 60 U/L (ref 17–63)
AST: 60 U/L — ABNORMAL HIGH (ref 15–41)
Albumin: 2.9 g/dL — ABNORMAL LOW (ref 3.5–5.0)
BILIRUBIN INDIRECT: 0.7 mg/dL (ref 0.3–0.9)
BILIRUBIN TOTAL: 0.9 mg/dL (ref 0.3–1.2)
Bilirubin, Direct: 0.2 mg/dL (ref 0.1–0.5)
Total Protein: 5.5 g/dL — ABNORMAL LOW (ref 6.5–8.1)

## 2016-03-20 LAB — LACTIC ACID, PLASMA: Lactic Acid, Venous: 2.3 mmol/L (ref 0.5–1.9)

## 2016-03-20 LAB — PROTIME-INR
INR: 2.43
Prothrombin Time: 26.8 seconds — ABNORMAL HIGH (ref 11.4–15.2)

## 2016-03-20 LAB — PHOSPHORUS: PHOSPHORUS: 3.3 mg/dL (ref 2.5–4.6)

## 2016-03-20 LAB — TROPONIN I: Troponin I: 0.21 ng/mL (ref <0.03)

## 2016-03-20 LAB — HEMOGLOBIN A1C
Hgb A1c MFr Bld: 7.3 % — ABNORMAL HIGH (ref 4.8–5.6)
MEAN PLASMA GLUCOSE: 163 mg/dL

## 2016-03-20 LAB — DIGOXIN LEVEL: Digoxin Level: 1.2 ng/mL (ref 0.8–2.0)

## 2016-03-20 MED ORDER — DEXTROSE 10 % IV SOLN: INTRAVENOUS | Status: DC | PRN

## 2016-03-20 MED ORDER — POTASSIUM CHLORIDE 10 MEQ/50ML IV SOLN
10.0000 meq | INTRAVENOUS | Status: DC
Start: 2016-03-20 — End: 2016-03-20
  Administered 2016-03-20 (×4): 10 meq via INTRAVENOUS
  Filled 2016-03-20 (×4): qty 50

## 2016-03-20 MED ORDER — DIGOXIN 125 MCG PO TABS
0.0625 mg | ORAL_TABLET | Freq: Every day | ORAL | Status: DC
  Administered 2016-03-20 – 2016-03-28 (×9): 0.0625 mg via ORAL
  Filled 2016-03-20 (×9): qty 1

## 2016-03-20 MED ORDER — DIGOXIN 125 MCG PO TABS: 0.0625 mg | ORAL_TABLET | Freq: Every day | ORAL | Status: DC

## 2016-03-20 MED ORDER — INSULIN GLARGINE 100 UNIT/ML ~~LOC~~ SOLN
10.0000 [IU] | Freq: Every day | SUBCUTANEOUS | Status: DC
  Administered 2016-03-20 – 2016-03-25 (×6): 10 [IU] via SUBCUTANEOUS
  Filled 2016-03-20 (×7): qty 0.1

## 2016-03-20 NOTE — Progress Notes (Signed)
Initial Nutrition Assessment  DOCUMENTATION CODES:   Obesity unspecified  INTERVENTION:  If pt remains intubated >/= 24-48 hours, recommend TF with Vital High Protein at goal rate of 45 ml/h (1080 ml per day) and Prostat 30 ml TID to provide 1380 kcals, 140 gm protein, 907 ml free water daily.  NUTRITION DIAGNOSIS:   Inadequate oral intake related to inability to eat as evidenced by NPO status.  GOAL:   Patient will meet greater than or equal to 90% of their needs  MONITOR:   Vent status, Labs, Weight trends, Skin, I & O's  REASON FOR ASSESSMENT:   Ventilator    ASSESSMENT:   55 year old male with known DM, HTN, DLD, & Systolic CHF (EF 74-16%). VF arrest w/ 12 min until ROSC. Initiated therapeutic hypothermia.  Patient is currently intubated on ventilator support MV: 12.5 L/min Temp (24hrs), Avg:91.1 F (32.8 C), Min:89.1 F (31.7 C), Max:93.2 F (34 C)  Propofol: none  Family at bedside, they report pt was eating well PTA with no other difficulties. Weight has been fairly stable. Recent weight loss likely related to fluid status as last hospital admission in June 2017, pt was fluid overloaded. Pt with no observed significant fat or muscle mass loss.   Labs and medications reviewed. Potassium low at 2.9.  Diet Order:   NPO  Skin:  Reviewed, no issues  Last BM:  Unknown  Height:   Ht Readings from Last 1 Encounters:  March 27, 2016 5\' 7"  (1.702 m)    Weight:   Wt Readings from Last 1 Encounters:  03-27-16 211 lb (95.7 kg)    Ideal Body Weight:  67.27 kg  BMI:  Body mass index is 33.05 kg/m.  Estimated Nutritional Needs:   Kcal:  3845-3646  Protein:  >/= 135 grams  Fluid:  Per MD  EDUCATION NEEDS:   No education needs identified at this time  Roslyn Smiling, MS, RD, LDN Pager # [phone] After hours/ weekend pager # [phone]

## 2016-03-20 NOTE — Progress Notes (Signed)
Called E-Link to notify Mannam, MD of error in lab draw Potassium of 7.1. Stopped IV replacement Potassium, redrew lab. K now 4.3. Per protocol, discontinued last 2 bags of potassium. Rewarming started at 1500. Will continue to monitor acutely.  Glade Lloyd, RN

## 2016-03-20 NOTE — Progress Notes (Addendum)
Notified Byrum, MD of slight twitch in pt's right side of his neck near his Right IJ and right above pt's right eyebrow as well as bleeding around right IJ insertion. MD at bedside to assess. No new orders at this time. Will continue to monitor acutely.   Glade Lloyd, RN

## 2016-03-20 NOTE — Progress Notes (Addendum)
eLink Physician-Brief Progress Note Patient Name: Benjamin [name] DOB: May 29, 1961 MRN: [id number]   Date of Service  03/20/2016  HPI/Events of Note  Contacted by bedside nurse regarding intermittent jerking movements after removal of paralytic agent. Patient currently in rewarming phase. Camera check shows patient with forward gaze. Pupils symmetric and without dilatation. Intermittent whole-body jerks noted . No apparent focal nature in any other extremities. Initial EEG report reviewed without findings of focal epileptiform activity.   eICU Interventions  1. Ordering stat EEG 2. Continuing Versed infusion 3. Continuing rewarming phase 4. Neurology to consult & assess patient     Intervention Category Major Interventions: Other:  Lawanda Cousins 03/20/2016, 11:59 PM

## 2016-03-20 NOTE — Progress Notes (Signed)
PULMONARY / CRITICAL CARE MEDICINE   Name: Rufina FalcoHatem D Gohr MRN: [id number] DOB: 04-16-1961    ADMISSION DATE:  03/16/2016 CONSULTATION DATE:  03/28/2016  REFERRING MD:  Loren Raceravid Yelverton, M.D. / EDP  CHIEF COMPLAINT:  Post Arrest  HISTORY OF PRESENT ILLNESS:  Per records patient was a witnessed arrest with CPR initiated by his son. On arrival patient was in PEA. Patient received approximately 12 minutes of CPR total as well as bolus epinephrine. Patient lost his pulse in the midst of transport to the emergency department but regained it prior to arrival. Rhythm was paced upon arrival in the emergency department. Endotracheal tube placed by EMS. Family confirm the patient did not have any purposeful movement or open his eyes at all during his resuscitation at home. Additionally his son noted that his AICD did fire as he was doing CPR. The wife found him laying on the bed, unresponsive, with abnormal respirations. She promptly called 911 & her son started CPR. PCCM was consulted for vent & medical management. Therapeutic hypothermia was initiated in the ED prior to going to the cath lab. The patient received 1L of iced saline. Patient did receive bolus bicarb during heart cath.  SUBJECTIVE:    VITAL SIGNS: BP (!) 143/106   Pulse 60   Temp (!) 89.8 F (32.1 C) (Core (Comment))   Resp 11   Ht 5\' 7"  (1.702 m)   Wt 95.7 kg (211 lb)   SpO2 100%   BMI 33.05 kg/m   HEMODYNAMICS: CVP:  [6 mmHg-13 mmHg] 6 mmHg  VENTILATOR SETTINGS: Vent Mode: PRVC FiO2 (%):  [40 %-100 %] 40 % Set Rate:  [18 bmp-24 bmp] 24 bmp Vt Set:  [530 mL] 530 mL PEEP:  [5 cmH20] 5 cmH20 Plateau Pressure:  [16 cmH20-24 cmH20] 16 cmH20  INTAKE / OUTPUT: I/O last 3 completed shifts: In: 1408.6 [I.V.:1008.6; IV Piggyback:400] Out: 1175 [Urine:1175]  PHYSICAL EXAMINATION: General:  Sedated. On ventilator. Nurses at bedside. Integument:  Warm & dry. No rash on exposed skin. No bruising. Pressure device in place over right  wrist. Lymphatics:  No appreciated cervical or supraclavicular lymphadenoapthy. HEENT:  No scleral injection or icterus. Endotracheal tube in place.  Cardiovascular:  Regular rate. No edema. No appreciable JVD.  Pulmonary:  Good aeration & clear to auscultation bilaterally. Symmetric chest wall rise on ventilator. Abdomen: Soft. Normal bowel sounds. Nondistended.  Musculoskeletal:  Normal bulk. No joint deformity or effusion appreciated. Neurological:  No withdrawal to pain in extremities. Symmetric DTRs. Pupils pinpoint & symmetric. No Babinski.  Psychiatric:  Unable to assess with sedation/endotracheal tube/altered mentation  LABS:  BMET  Recent Labs Lab 03/10/2016 2003 03/20/16 0016 03/20/16 0435  NA 136 137 139  K 4.3 3.8 3.5  CL 108 107 109  CO2 23 21* 23  BUN 25* 25* 27*  CREATININE 1.41* 1.31* 1.36*  GLUCOSE 250* 209* 150*    Electrolytes  Recent Labs Lab 03/28/2016 1345 03/06/2016 2003 03/20/16 0016 03/20/16 0435  CALCIUM 7.7* 8.0* 8.0* 8.1*  MG 1.8  --   --  1.9  PHOS 3.9  --   --  3.3    CBC  Recent Labs Lab 03/22/2016 1015 03/20/2016 1418 03/18/2016 1523 03/26/2016 1817  WBC 12.3*  --   --   --   HGB 13.2 14.6 12.9* 12.9*  HCT 41.8 43.0 38.0* 38.0*  PLT 233  --   --   --     Coag's  Recent Labs Lab 03/10/2016 1015 03/13/2016 1345  03/06/2016 2002 03/31/2016 2003 03/20/16 0435  APTT  --  34  --  37*  --   INR 1.90  --  2.03  --  2.43    Sepsis Markers  Recent Labs Lab 03/14/2016 1400 03/21/2016 2003 03/20/16 0200  LATICACIDVEN 2.2* 2.0* 2.3*    ABG  Recent Labs Lab 03/20/2016 1107 03/18/2016 1129 03/12/2016 1608  PHART 7.339* 7.408 7.397  PCO2ART 36.5 36.5 30.4*  PO2ART 162.0* 237.0* 195.0*    Liver Enzymes  Recent Labs Lab 03/28/2016 1015 03/20/16 0435  AST 127* 60*  ALT 86* 60  ALKPHOS 92 69  BILITOT 1.0 0.9  ALBUMIN 3.4* 2.9*    Cardiac Enzymes  Recent Labs Lab 03/05/2016 1345 03/17/2016 2003 03/20/16 0200  TROPONINI 0.35* 0.26*  0.21*    Glucose  Recent Labs Lab 03/20/16 0210 03/20/16 0315 03/20/16 0318 03/20/16 0402 03/20/16 0501 03/20/16 0800  GLUCAP 150* 52* 141* 132* 138* 143*    Imaging Dg Chest Port 1 View  Result Date: 03/05/2016 CLINICAL DATA:  Central catheter placement EXAM: PORTABLE CHEST 1 VIEW COMPARISON:  Study obtained earlier in the day FINDINGS: Central catheter tip is in the superior vena cava. Nasogastric tube tip and side port are below the diaphragm. Endotracheal tube tip is 5.4 cm above the carina. Pacemaker leads are attached to the right atrium, right ventricle, and left ventricle, stable. No pneumothorax. There is atelectatic change in the left base. Lungs elsewhere clear. There is cardiomegaly. The pulmonary vascularity is within normal limits. No adenopathy. IMPRESSION: Tube and catheter positions as described without pneumothorax. Stable cardiomegaly. Left base atelectasis, unchanged. No new opacity. Electronically Signed   By: Bretta Bang III M.D.   On: 03/17/2016 14:10   Dg Chest Port 1 View  Result Date: 03/15/2016 CLINICAL DATA:  Evaluate endotracheal tube placement. EXAM: PORTABLE CHEST 1 VIEW COMPARISON:  01/12/2016 FINDINGS: Endotracheal tube is 4.5 cm above the carina. The cardiac silhouette is enlarged. There is a left-sided biventricular ICD. Haziness and increased densities in the perihilar regions are suggestive for vascular congestion and edema. Negative for a pneumothorax. IMPRESSION: Endotracheal tube is appropriately positioned. Cardiomegaly with central vascular congestion and mild edema. Electronically Signed   By: Richarda Overlie M.D.   On: 03/06/2016 10:30   Ct Portable Head W/o Cm  Result Date: 03/31/2016 CLINICAL DATA:  Status post cardiac arrest today for 8 minutes. EXAM: CT HEAD WITHOUT CONTRAST TECHNIQUE: Contiguous axial images were obtained from the base of the skull through the vertex without intravenous contrast. COMPARISON:  05/21/2008. FINDINGS: Study was  performed with a portable apparatus. Image quality is reduced. Brain: There is a area of 27 x 16 mm hypoattenuation in the RIGHT occipital lobe, uncertain significance and duration. This was not present in 2009, but image quality is insufficient to distinguish between an acute versus chronic infarct. Hypoattenuation due to a mass is less likely. Poor gray-white differentiation throughout the cerebral hemispheres. Anoxic- ischemic injury is not excluded. No hemorrhage, hydrocephalus, or extra-axial fluid. Mild atrophy, premature for age. Vascular: No hyperdense vessel.  Carotid siphon calcification. Skull: Negative Sinuses/Orbits: Negative Other: None. IMPRESSION: Poor gray-white differentiation throughout the cerebral hemispheres. Anoxic ischemic injury is not excluded. Superficial RIGHT occipital lobe area of hypoattenuation, approximately the 3 x 1.5 cm, could represent an acute or chronic infarct. Continued surveillance warranted. No intracranial hemorrhage or large vessel occlusion. Electronically Signed   By: Elsie Stain M.D.   On: 03/31/2016 20:16     STUDIES:  PFT 12/12/15: FVC  3.06 L (68%) FEV1 2.11 L (61%) FEV1/FVC 0.69 positive bronchodilator response TLC 4.61 L (72%) RV 77% ERV 78% DLCO uncorrected 72% TTE 02/11/16: LV severely dilated. EF 15-20% w/ diffuse hypokinesis. Grade 3 diastolic dysfunction. RV mildly dilated with normal systolic function. Moderate mitral regurgitation. No aortic stenosis or regurgitation. Port CXR 8/18: Endotracheal tube in acceptable position. Suggestion of cardiomegaly. Left lower lobe opacity as well as right midlung opacity with air bronchograms suggesting consolidation. EEG 8/18 >> no over seizure activity, generalized low voltage and irregular delta activity Head CT scan 8/18 >> poor gray-white differentiation throughout hemispheres, R occipital hypo-attenuation could represent chronic or sub-acute infarct L heart cath 8/18 >> no significant change from prior  study 11/2015  MICROBIOLOGY: MRSA PCR 8/18 >> Tracheal Asp Ctx 8/18 >> Blood Ctx x2 8/18 >>  ANTIBIOTICS: Unasyn 8/18 >>  SIGNIFICANT EVENTS: 8/18 - Admit post arrest w/ cooling initiated & LHC  LINES/TUBES: OETT 7.0 8/18 >> Foley 8/18 >> PIV x2  DISCUSSION:  55 year old male with known DM, HTN, DLD, & Systolic CHF (EF 93-81%). VF arrest w/ 12 min until ROSC. Initiated therapeutic hypothermia. Neurological prognosis remains guarded.   ASSESSMENT / PLAN:  CARDIOVASCULAR A:  S/P VF Arrest - 12 min downtime. Witnessed & CPR initiated by son. Shock - Likely due to medication/sedation +/- cardiogenic H/O NSTEMI/CAD H/O Systolic CHF H/O AICD Placement H/O HTN & DLD  P:  Cardiology managing Vitals per unit protocol Continuous telemetry monitoring Trending Troponin I q6hr Amiodarone, digoxin  Systemic anticoagulation > plavix, start angiomax once INR < 2.0  NEUROLOGIC A:   Sedation on Ventilator Acute Encephalopathy - Likely from anoxia. No reported purposeful movements post arrest.  P:   RASS goal: Per Hypothermia Protocol 33C Hypothermia Protocol Versed gtt & IV prn Fentanyl gtt & IV prn Nimbex gtt prn shivering May need to involve neurology after wakeup for prognostication Consider repeat head CT scan 8/20  PULMONARY A: Acute Hypoxic Respiratory Failure - Post arrest. Probable Aspiration - R Lung on CXR. Moderate COPD - Based on pre-bronchod spiro May 2017. Mild Restrictive Lung Disease - Based on lung vol May 2017. Likely due to cardiomyopathy.  P:   Full Vent Support Assess for SBT & WUA once rewarmed Intermittent Port CXR & ABG  RENAL A:   Acute Renal Failure Lactic Acidosis, improved  P:   Trending UOP with Foley Monitoring electrolytes & renal function per protocol Replacing electrolytes as indicated  GASTROINTESTINAL A:   Transaminitis - Mild. Likely due to shock.  P:   NPO Pepcid IV q12hr Trending LFTs daily  HEMATOLOGIC A:    Coagulopathy on Systemic Anticoagulation - Coumadin as outpatient. INR 1.9 on arrival. Leukocytosis - Likely reactive from arrest.  P:  Trending cell counts w/ CBC daily INR daily SCDs  No heparin or lovenox due to religious beliefs Coumadin d/c'd, plan is to start angiomax when INR < 2  INFECTIOUS A:   Probable Aspiration Pneumonia vs Pneumonitis  P:   Empiric Unasyn Day #2 Tracheal Asp Ctx  ENDOCRINE A:   H/O DM Type 2    P:   Checking Hgb A1c SSI per Moderate algorithm Accu-Checks q4hr Holding home Lantus & Metformin  FAMILY  - Updates: Wife updated by Dr Delton Coombes 8/19  - Inter-disciplinary family meet or Palliative Care meeting due by:  8/25   Independent CC time 33 miutes   Levy Pupa, MD, PhD 03/20/2016, 8:39 AM [facility] [phone] or if no  answer [phone]

## 2016-03-20 NOTE — Progress Notes (Signed)
ANTICOAGULATION CONSULT NOTE - Initial Consult  Pharmacy Consult for Coumadin / Angiomax Indication: atrial flutter  Allergies  Allergen Reactions  . Heparin Other (See Comments)    Will not use pork-derived products due to religious beliefs   . Lovenox [Enoxaparin Sodium] Other (See Comments)    Will not use pork-derived products due to religious beliefs   . Pork-Derived Products Other (See Comments)    Does not eat pork or pork derived products d/t religious reasons    Patient Measurements: Height: 5\' 7"  (170.2 cm) Weight: 211 lb (95.7 kg) IBW/kg (Calculated) : 66.1  Vital Signs: Temp: 91.6 F (33.1 C) (08/19 0900) Temp Source: Core (Comment) (08/19 0900) BP: 89/71 (08/19 0900) Pulse Rate: 60 (08/19 0900)  Labs:  Recent Labs  03/28/2016 1015 03/05/2016 1345  03/09/2016 1523 03/03/2016 1817 03/28/2016 2002 03/14/2016 2003 03/20/16 0016 03/20/16 0200 03/20/16 0435 03/20/16 0806  HGB 13.2  --   < > 12.9* 12.9*  --   --   --   --   --  13.9  HCT 41.8  --   < > 38.0* 38.0*  --   --   --   --   --  41.0  PLT 233  --   --   --   --   --   --   --   --   --   --   APTT  --  34  --   --   --   --  37*  --   --   --   --   LABPROT 22.0*  --   --   --   --  23.3*  --   --   --  26.8*  --   INR 1.90  --   --   --   --  2.03  --   --   --  2.43  --   CREATININE 1.55* 1.52*  < > 1.00 0.90  --  1.41* 1.31*  --  1.36* 0.90  TROPONINI 0.10* 0.35*  --   --   --   --  0.26*  --  0.21*  --   --   < > = values in this interval not displayed.  Estimated Creatinine Clearance: 103.4 mL/min (by C-G formula based on SCr of 0.9 mg/dL).   Medical History: Past Medical History:  Diagnosis Date  . AICD (automatic cardioverter/defibrillator) present   . Cholelithiasis   . Chronic systolic heart failure (HCC)   . Coronary artery disease    distal vessel disease  . GERD (gastroesophageal reflux disease)   . High cholesterol   . Hypertension   . Kidney stone   . NSTEMI (non-ST elevated  myocardial infarction) (HCC)   . Pneumonia 2002  . Type II diabetes mellitus (HCC)    Assessment: 55yom on coumadin pta for aflutter, admitted s/p cardiac arrest and now on hypothermia protocol. INR 1.9 on admit and Coumadin not given 8/18 PM.   Order to begin Angiomax at very low dose to substitute for heparin when INR < 2 INR this am remains >2. Currently cooled. Cannot use heparin/lovenox due to religious beliefs. CBC wnl and stable.   PTA dose: 1.25mg  daily  Goal of Therapy:  INR 2-3 Monitor platelets by anticoagulation protocol: Yes   Plan:  1) Continue to hold Coumadin and Angiomax with INR > 2 2) Daily INR  Chauncey Bruno K. Bonnye Fava, PharmD, BCPS, CPP Clinical Pharmacist Pager: [phone] Phone: [phone] 03/20/2016 9:29 AM

## 2016-03-20 NOTE — Progress Notes (Signed)
Patient ID: Benjamin [name], male   DOB: [birth year], 55 y.o.   MRN: [id number]   Benjamin [name] is 55 year old male with history of systolic HF due to ischemic cardiomyopathy (EF 20-25%) S/P St Jude bi-V ICD implanted October 2016, PAFL, hypertension, diabetes mellitus, hyperlipidemia who presents with VT/VF arrest at home.   Had cath in 3/17 prior to cholecytstectomy which complex, calcified, multivessel coronary disease with high-grade obstruction in the mid LAD and distal RCA.   Admitted in April with AFL. Converted with amio. And then underwent 2-V PCI of LAD and RCA with rotoblator under Impella support.   Admitted June 2017 for volume overload in setting of recurrent AFL. Had successful A flutter ablation. Diuresed with IV lasix and transitioned to 40 mg lasix daily. He continued on amiodarone and coumadin. Discharge weight was 226 pounds.   Had been doing well just returned from trip to [place] with his wife. Was in bed 8/18 am and wife went to bathroom. She hear him making a strange sound so came out of bathroom and found him unresponsive on the bed. She called her son who was downstairs. He called 911 and immediately stadted CPR. While doing CPR he felt ICD shock. Intubated in field and brought to ER. In ER, unresponsive but had stable BP and HR. Patient did not complain of CP prior to arrest.  ICD interrogated showed VF that was just below the 200 beat threshold for a while (poor R wave detection) and finally received shock back to NSR. In ER decision made to take emergently to cath lab for coronary angiogram and beginning cooling process. Coronary angiography was stable compared to post-PCI in 4/17, suspect primary arrhythmic event.   Currently, intubated/sedated and cooled.  Not responsive.  CT possibly suggestive of anoxic injury.  He is on low dose norepinephrine today with stable hemodynamics and CVP 6.  Creatinine back to baseline.   Scheduled Meds: . ampicillin-sulbactam (UNASYN) IV  3 g  Intravenous Q8H  . antiseptic oral rinse  7 mL Mouth Rinse 10 times per day  . artificial tears  1 application Both Eyes H8E  . chlorhexidine gluconate (SAGE KIT)  15 mL Mouth Rinse BID  . clopidogrel  75 mg Per Tube Daily  . digoxin  0.0625 mg Oral Daily  . famotidine (PEPCID) IV  20 mg Intravenous Q12H  . insulin glargine  10 Units Subcutaneous Daily  . potassium chloride  10 mEq Intravenous Q1 Hr x 6  . rosuvastatin  20 mg Per Tube q1800  . sodium chloride flush  10-40 mL Intracatheter Q12H  . sodium chloride flush  3 mL Intravenous Q12H   Continuous Infusions: . amiodarone 30 mg/hr (03/20/16 0400)  . cisatracurium (NIMBEX) infusion 1 mcg/kg/min (03/20/16 0000)  . dextrose    . fentaNYL infusion INTRAVENOUS 150 mcg/hr (03/20/16 0400)  . midazolam (VERSED) infusion 3 mg/hr (03/20/16 0400)  . norepinephrine (LEVOPHED) Adult infusion 3 mcg/min (03/20/16 0914)   PRN Meds:.sodium chloride, Place/Maintain arterial line **AND** sodium chloride, acetaminophen, [COMPLETED] cisatracurium **AND** cisatracurium (NIMBEX) infusion **AND** cisatracurium, dextrose, fentaNYL, midazolam, ondansetron (ZOFRAN) IV, sodium chloride flush, sodium chloride flush     Vitals:   03/20/16 0800 03/20/16 0900 03/20/16 0915 03/20/16 0933  BP: 93/74 (!) 89/71 96/75   Pulse: 60 60 60 60  Resp: (!) 24 (!) 24 19   Temp: (!) 91.2 F (32.9 C) (!) 91.6 F (33.1 C)    TempSrc: Core (Comment) Core (Comment)    SpO2: 100% 100% 100%  Weight:      Height:        Intake/Output Summary (Last 24 hours) at 03/20/16 0944 Last data filed at 03/20/16 0900  Gross per 24 hour  Intake          1539.39 ml  Output             1370 ml  Net           169.39 ml    LABS: Basic Metabolic Panel:  Recent Labs  03/22/2016 1345  03/20/16 0016 03/20/16 0435 03/20/16 0806  NA 135  < > 137 139 145  K 6.0*  < > 3.8 3.5 2.9*  CL 107  < > 107 109 108  CO2 22  < > 21* 23  --   GLUCOSE 207*  < > 209* 150* 140*  BUN 23*  < >  25* 27* 24*  CREATININE 1.52*  < > 1.31* 1.36* 0.90  CALCIUM 7.7*  < > 8.0* 8.1*  --   MG 1.8  --   --  1.9  --   PHOS 3.9  --   --  3.3  --   < > = values in this interval not displayed. Liver Function Tests:  Recent Labs  03/13/2016 1015 03/20/16 0435  AST 127* 60*  ALT 86* 60  ALKPHOS 92 69  BILITOT 1.0 0.9  PROT 6.5 5.5*  ALBUMIN 3.4* 2.9*   No results for input(s): LIPASE, AMYLASE in the last 72 hours. CBC:  Recent Labs  03/31/2016 1015  03/09/2016 1817 03/20/16 0806  WBC 12.3*  --   --   --   NEUTROABS 7.6  --   --   --   HGB 13.2  < > 12.9* 13.9  HCT 41.8  < > 38.0* 41.0  MCV 72.8*  --   --   --   PLT 233  --   --   --   < > = values in this interval not displayed. Cardiac Enzymes:  Recent Labs  03/03/2016 1345 03/22/2016 2003 03/20/16 0200  TROPONINI 0.35* 0.26* 0.21*   BNP: Invalid input(s): POCBNP D-Dimer: No results for input(s): DDIMER in the last 72 hours. Hemoglobin A1C:  Recent Labs  03/15/2016 1345  HGBA1C 7.3*   Fasting Lipid Panel:  Recent Labs  03/07/2016 1345  CHOL 95  HDL 28*  LDLCALC 46  TRIG 103  CHOLHDL 3.4   Thyroid Function Tests:  Recent Labs  03/08/2016 1345  TSH 1.491   Anemia Panel: No results for input(s): VITAMINB12, FOLATE, FERRITIN, TIBC, IRON, RETICCTPCT in the last 72 hours.  RADIOLOGY: Dg Chest Port 1 View  Result Date: 03/18/2016 CLINICAL DATA:  Central catheter placement EXAM: PORTABLE CHEST 1 VIEW COMPARISON:  Study obtained earlier in the day FINDINGS: Central catheter tip is in the superior vena cava. Nasogastric tube tip and side port are below the diaphragm. Endotracheal tube tip is 5.4 cm above the carina. Pacemaker leads are attached to the right atrium, right ventricle, and left ventricle, stable. No pneumothorax. There is atelectatic change in the left base. Lungs elsewhere clear. There is cardiomegaly. The pulmonary vascularity is within normal limits. No adenopathy. IMPRESSION: Tube and catheter positions  as described without pneumothorax. Stable cardiomegaly. Left base atelectasis, unchanged. No new opacity. Electronically Signed   By: Lowella Grip III M.D.   On: 03/07/2016 14:10   Dg Chest Port 1 View  Result Date: 03/03/2016 CLINICAL DATA:  Evaluate endotracheal tube placement. EXAM: PORTABLE CHEST  1 VIEW COMPARISON:  01/12/2016 FINDINGS: Endotracheal tube is 4.5 cm above the carina. The cardiac silhouette is enlarged. There is a left-sided biventricular ICD. Haziness and increased densities in the perihilar regions are suggestive for vascular congestion and edema. Negative for a pneumothorax. IMPRESSION: Endotracheal tube is appropriately positioned. Cardiomegaly with central vascular congestion and mild edema. Electronically Signed   By: Markus Daft M.D.   On: 03/10/2016 10:30   Ct Portable Head W/o Cm  Result Date: 03/22/2016 CLINICAL DATA:  Status post cardiac arrest today for 8 minutes. EXAM: CT HEAD WITHOUT CONTRAST TECHNIQUE: Contiguous axial images were obtained from the base of the skull through the vertex without intravenous contrast. COMPARISON:  05/21/2008. FINDINGS: Study was performed with a portable apparatus. Image quality is reduced. Brain: There is a area of 27 x 16 mm hypoattenuation in the RIGHT occipital lobe, uncertain significance and duration. This was not present in 2009, but image quality is insufficient to distinguish between an acute versus chronic infarct. Hypoattenuation due to a mass is less likely. Poor gray-white differentiation throughout the cerebral hemispheres. Anoxic- ischemic injury is not excluded. No hemorrhage, hydrocephalus, or extra-axial fluid. Mild atrophy, premature for age. Vascular: No hyperdense vessel.  Carotid siphon calcification. Skull: Negative Sinuses/Orbits: Negative Other: None. IMPRESSION: Poor gray-white differentiation throughout the cerebral hemispheres. Anoxic ischemic injury is not excluded. Superficial RIGHT occipital lobe area of  hypoattenuation, approximately the 3 x 1.5 cm, could represent an acute or chronic infarct. Continued surveillance warranted. No intracranial hemorrhage or large vessel occlusion. Electronically Signed   By: Staci Righter M.D.   On: 03/18/2016 20:16    PHYSICAL EXAM General: Intubated/sedated.  Neck: No JVD, no thyromegaly or thyroid nodule.  Lungs: Crackles dependently. CV: Lateral PMI.  Heart regular S1/S2, no S3/S4, no murmur.  No peripheral edema.   Abdomen: Soft, nontender, no hepatosplenomegaly, no distention.  Neurologic: Intubated, not responsive.  Extremities: No clubbing or cyanosis.   TELEMETRY: Reviewed telemetry pt in a-paced, BiV paced  ASSESSMENT AND PLAN: 55 yo with history of systolic HF due to ischemic cardiomyopathy (EF 20-25%) S/P St Jude bi-V ICD implanted October 2016, PAFL, hypertension, diabetes mellitus, hyperlipidemia who presents with VT/VF arrest at home.  1. Cardiac arrest: VF arrest at home.  Coronary angiography showed stable coronary anatomy compared to post-PCI in 4/17.  Suspect primary arrhythmic arrest.  - Continue amiodarone gtt for now.  - Undergoing hypothermia protocol, rewarming to start today.  - Continue digoxin. Dose lowered with elevated level.  2. Chronic systolic CHF: EF 59-56%, ischemic cardiomyopathy.  On low dose norepinephrine while hypothermic.  CVP 6.  Will get co-ox in am.  3. CAD: Stable anatomy on angiography. Continue Plavix, statin.  4. Atrial flutter: s/p ablation.  Had been on warfarin.  Holding warfarin for now, will use bivalirudin when INR < 2 (no heparin/Lovenox with religious beliefs).  5. Possible aspiration PNA: On antibiotics per CCM.  6. Neuro: Possible anoxic injury on CT.  Will follow closely with rewarming.   Loralie Champagne 03/20/2016 9:52 AM

## 2016-03-21 ENCOUNTER — Inpatient Hospital Stay (HOSPITAL_COMMUNITY): Payer: Medicaid Other

## 2016-03-21 DIAGNOSIS — G253 Myoclonus: Secondary | ICD-10-CM

## 2016-03-21 DIAGNOSIS — I639 Cerebral infarction, unspecified: Secondary | ICD-10-CM

## 2016-03-21 LAB — CARBOXYHEMOGLOBIN
Carboxyhemoglobin: 0.6 % (ref 0.5–1.5)
METHEMOGLOBIN: 1.1 % (ref 0.0–1.5)
O2 Saturation: 60.9 %
Total hemoglobin: 11.6 g/dL — ABNORMAL LOW (ref 13.5–18.0)

## 2016-03-21 LAB — HEPATIC FUNCTION PANEL
ALBUMIN: 3 g/dL — AB (ref 3.5–5.0)
ALT: 55 U/L (ref 17–63)
AST: 44 U/L — AB (ref 15–41)
Alkaline Phosphatase: 70 U/L (ref 38–126)
BILIRUBIN TOTAL: 1 mg/dL (ref 0.3–1.2)
Bilirubin, Direct: 0.2 mg/dL (ref 0.1–0.5)
Indirect Bilirubin: 0.8 mg/dL (ref 0.3–0.9)
Total Protein: 6 g/dL — ABNORMAL LOW (ref 6.5–8.1)

## 2016-03-21 LAB — PROTIME-INR
INR: 3.08
PROTHROMBIN TIME: 32.5 s — AB (ref 11.4–15.2)

## 2016-03-21 LAB — BASIC METABOLIC PANEL
Anion gap: 10 (ref 5–15)
Anion gap: 9 (ref 5–15)
BUN: 28 mg/dL — AB (ref 6–20)
BUN: 30 mg/dL — AB (ref 6–20)
CHLORIDE: 109 mmol/L (ref 101–111)
CHLORIDE: 108 mmol/L (ref 101–111)
CO2: 21 mmol/L — AB (ref 22–32)
CO2: 21 mmol/L — AB (ref 22–32)
CREATININE: 1.18 mg/dL (ref 0.61–1.24)
Calcium: 8.2 mg/dL — ABNORMAL LOW (ref 8.9–10.3)
Calcium: 8.4 mg/dL — ABNORMAL LOW (ref 8.9–10.3)
Creatinine, Ser: 1.69 mg/dL — ABNORMAL HIGH (ref 0.61–1.24)
GFR calc Af Amer: 51 mL/min — ABNORMAL LOW (ref >60)
GFR calc non Af Amer: 44 mL/min — ABNORMAL LOW (ref >60)
GFR calc non Af Amer: >60 mL/min (ref >60)
Glucose, Bld: 140 mg/dL — ABNORMAL HIGH (ref 65–99)
Glucose, Bld: 149 mg/dL — ABNORMAL HIGH (ref 65–99)
POTASSIUM: 4.1 mmol/L (ref 3.5–5.1)
POTASSIUM: 5 mmol/L (ref 3.5–5.1)
SODIUM: 139 mmol/L (ref 135–145)
Sodium: 139 mmol/L (ref 135–145)

## 2016-03-21 LAB — CBC
HCT: 45.3 % (ref 39.0–52.0)
HEMOGLOBIN: 14.2 g/dL (ref 13.0–17.0)
MCH: 22.4 pg — AB (ref 26.0–34.0)
MCHC: 31.3 g/dL (ref 30.0–36.0)
MCV: 71.6 fL — AB (ref 78.0–100.0)
PLATELETS: 196 10*3/uL (ref 150–400)
RBC: 6.33 MIL/uL — AB (ref 4.22–5.81)
RDW: 21.7 % — ABNORMAL HIGH (ref 11.5–15.5)
WBC: 13.9 10*3/uL — ABNORMAL HIGH (ref 4.0–10.5)

## 2016-03-21 LAB — GLUCOSE, CAPILLARY
GLUCOSE-CAPILLARY: 148 mg/dL — AB (ref 65–99)
GLUCOSE-CAPILLARY: 136 mg/dL — AB (ref 65–99)
GLUCOSE-CAPILLARY: 167 mg/dL — AB (ref 65–99)
Glucose-Capillary: 148 mg/dL — ABNORMAL HIGH (ref 65–99)
Glucose-Capillary: 180 mg/dL — ABNORMAL HIGH (ref 65–99)

## 2016-03-21 LAB — TRIGLYCERIDES: Triglycerides: 150 mg/dL — ABNORMAL HIGH (ref <150)

## 2016-03-21 MED ORDER — HYDRALAZINE HCL 20 MG/ML IJ SOLN
10.0000 mg | INTRAMUSCULAR | Status: DC | PRN
Start: 2016-03-21 — End: 2016-03-29
  Administered 2016-03-27: 10 mg via INTRAVENOUS
  Filled 2016-03-21: qty 1

## 2016-03-21 MED ORDER — FENTANYL CITRATE (PF) 100 MCG/2ML IJ SOLN
25.0000 ug | INTRAMUSCULAR | Status: DC | PRN
  Administered 2016-03-21 – 2016-03-28 (×3): 50 ug via INTRAVENOUS
  Filled 2016-03-21 (×3): qty 2

## 2016-03-21 MED ORDER — LORAZEPAM 2 MG/ML IJ SOLN
INTRAMUSCULAR | Status: AC
  Filled 2016-03-21: qty 2

## 2016-03-21 MED ORDER — SODIUM CHLORIDE 0.9 % IV SOLN
1500.0000 mg | Freq: Two times a day (BID) | INTRAVENOUS | Status: DC
  Administered 2016-03-21 (×2): 1500 mg via INTRAVENOUS
  Filled 2016-03-21 (×3): qty 15

## 2016-03-21 MED ORDER — PROPOFOL BOLUS VIA INFUSION
2.0000 mg/kg | Freq: Once | INTRAVENOUS | Status: DC
  Filled 2016-03-21: qty 208

## 2016-03-21 MED ORDER — VALPROATE SODIUM 500 MG/5ML IV SOLN
500.0000 mg | Freq: Three times a day (TID) | INTRAVENOUS | Status: DC
  Administered 2016-03-21 – 2016-03-28 (×20): 500 mg via INTRAVENOUS
  Filled 2016-03-21 (×24): qty 5

## 2016-03-21 MED ORDER — PROPOFOL 1000 MG/100ML IV EMUL
80.0000 ug/kg/min | INTRAVENOUS | Status: DC
Start: 2016-03-21 — End: 2016-03-22
  Administered 2016-03-21 (×2): 80 ug/kg/min via INTRAVENOUS
  Administered 2016-03-21: 70 ug/kg/min via INTRAVENOUS
  Administered 2016-03-21 – 2016-03-22 (×9): 80 ug/kg/min via INTRAVENOUS
  Filled 2016-03-21 (×9): qty 100

## 2016-03-21 MED ORDER — LABETALOL HCL 5 MG/ML IV SOLN: 10.0000 mg | INTRAVENOUS | Status: DC | PRN

## 2016-03-21 MED ORDER — SODIUM CHLORIDE 0.9 % IV SOLN
1000.0000 mg | Freq: Two times a day (BID) | INTRAVENOUS | Status: DC
Start: 2016-03-21 — End: 2016-03-29
  Administered 2016-03-21 – 2016-03-28 (×15): 1000 mg via INTRAVENOUS
  Filled 2016-03-21 (×18): qty 10

## 2016-03-21 MED ORDER — INSULIN ASPART 100 UNIT/ML ~~LOC~~ SOLN
0.0000 [IU] | SUBCUTANEOUS | Status: DC
Start: 2016-03-21 — End: 2016-03-29
  Administered 2016-03-21: 3 [IU] via SUBCUTANEOUS
  Administered 2016-03-21 (×2): 2 [IU] via SUBCUTANEOUS
  Administered 2016-03-21: 3 [IU] via SUBCUTANEOUS
  Administered 2016-03-22 – 2016-03-23 (×6): 2 [IU] via SUBCUTANEOUS
  Administered 2016-03-24: 3 [IU] via SUBCUTANEOUS
  Administered 2016-03-24 (×4): 2 [IU] via SUBCUTANEOUS
  Administered 2016-03-25 (×2): 3 [IU] via SUBCUTANEOUS
  Administered 2016-03-25: 2 [IU] via SUBCUTANEOUS
  Administered 2016-03-25: 3 [IU] via SUBCUTANEOUS
  Administered 2016-03-25: 2 [IU] via SUBCUTANEOUS
  Administered 2016-03-26 – 2016-03-27 (×10): 3 [IU] via SUBCUTANEOUS
  Administered 2016-03-27 (×2): 5 [IU] via SUBCUTANEOUS
  Administered 2016-03-28: 3 [IU] via SUBCUTANEOUS
  Administered 2016-03-28: 5 [IU] via SUBCUTANEOUS
  Administered 2016-03-28 – 2016-03-29 (×5): 3 [IU] via SUBCUTANEOUS

## 2016-03-21 MED ORDER — VITAMIN K1 10 MG/ML IJ SOLN
5.0000 mg | Freq: Once | INTRAVENOUS | Status: AC
  Administered 2016-03-21: 5 mg via INTRAVENOUS
  Filled 2016-03-21: qty 0.5

## 2016-03-21 MED ORDER — PROPOFOL BOLUS VIA INFUSION
50.0000 mg | INTRAVENOUS | Status: AC
  Administered 2016-03-21 (×2): 50 mg via INTRAVENOUS
  Filled 2016-03-21 (×3): qty 50

## 2016-03-21 MED ORDER — VALPROATE SODIUM 500 MG/5ML IV SOLN
250.0000 mg | Freq: Three times a day (TID) | INTRAVENOUS | Status: DC
  Administered 2016-03-21 (×2): 250 mg via INTRAVENOUS
  Filled 2016-03-21 (×4): qty 2.5

## 2016-03-21 MED ORDER — LORAZEPAM 2 MG/ML IJ SOLN
4.0000 mg | Freq: Once | INTRAMUSCULAR | Status: AC
  Administered 2016-03-21: 2 mg via INTRAVENOUS

## 2016-03-21 MED ORDER — MIDAZOLAM HCL 2 MG/2ML IJ SOLN
1.0000 mg | INTRAMUSCULAR | Status: DC | PRN
  Administered 2016-03-21: 4 mg via INTRAVENOUS
  Filled 2016-03-21: qty 4

## 2016-03-21 MED ORDER — PROPOFOL 1000 MG/100ML IV EMUL
INTRAVENOUS | Status: AC
  Filled 2016-03-21: qty 100

## 2016-03-21 NOTE — Progress Notes (Signed)
ANTICOAGULATION CONSULT NOTE - Initial Consult  Pharmacy Consult for Coumadin / Angiomax Indication: atrial flutter  Allergies  Allergen Reactions  . Heparin Other (See Comments)    Will not use pork-derived products due to religious beliefs   . Lovenox [Enoxaparin Sodium] Other (See Comments)    Will not use pork-derived products due to religious beliefs   . Pork-Derived Products Other (See Comments)    Does not eat pork or pork derived products d/t religious reasons    Patient Measurements: Height: 5\' 7"  (170.2 cm) Weight: 229 lb 4.5 oz (104 kg) IBW/kg (Calculated) : 66.1  Vital Signs: Temp: 97.7 F (36.5 C) (08/20 0730) Temp Source: Core (Comment) (08/20 0730) BP: 132/95 (08/20 0730) Pulse Rate: 78 (08/20 0730)  Labs:  Recent Labs  16-Jun-2016 1015 16-Jun-2016 1345  16-Jun-2016 2002 16-Jun-2016 2003  03/20/16 0200 03/20/16 0435  03/20/16 1557 03/20/16 2006 03/21/16 0005 03/21/16 0437  HGB 13.2  --   < >  --   --   --   --   --   < > 15.0 15.6  --  14.2  HCT 41.8  --   < >  --   --   --   --   --   < > 44.0 46.0  --  45.3  PLT 233  --   --   --   --   --   --   --   --   --   --   --  196  APTT  --  34  --   --  37*  --   --   --   --   --   --   --   --   LABPROT 22.0*  --   --  23.3*  --   --   --  26.8*  --   --   --   --  32.5*  INR 1.90  --   --  2.03  --   --   --  2.43  --   --   --   --  3.08  CREATININE 1.55* 1.52*  < >  --  1.41*  < >  --  1.36*  < > 1.00 1.00 1.18 1.69*  TROPONINI 0.10* 0.35*  --   --  0.26*  --  0.21*  --   --   --   --   --   --   < > = values in this interval not displayed.  Estimated Creatinine Clearance: 57.5 mL/min (by C-G formula based on SCr of 1.69 mg/dL).   Medical History: Past Medical History:  Diagnosis Date  . AICD (automatic cardioverter/defibrillator) present   . Cholelithiasis   . Chronic systolic heart failure (HCC)   . Coronary artery disease    distal vessel disease  . GERD (gastroesophageal reflux disease)   . High  cholesterol   . Hypertension   . Kidney stone   . NSTEMI (non-ST elevated myocardial infarction) (HCC)   . Pneumonia 2002  . Type II diabetes mellitus (HCC)    Assessment: 55yom on coumadin pta for aflutter, admitted s/p cardiac arrest and now on hypothermia protocol. INR 1.9 on admit and Coumadin not given 8/18 PM.   Order to begin Angiomax at very low dose to substitute for heparin when INR < 2 INR this am remains >2. Currently cooled. Cannot use heparin/lovenox due to religious beliefs. CBC wnl and stable with no reported s/s bleeding.  PTA dose: 1.25mg  daily  Goal of Therapy:  INR 2-3 Monitor platelets by anticoagulation protocol: Yes   Plan:  1) Continue to hold Coumadin and Angiomax with INR > 2 2) Daily INR  Maleiah Dula K. Bonnye Fava, PharmD, BCPS, CPP Clinical Pharmacist Pager: [phone] Phone: [phone] 03/21/2016 7:55 AM

## 2016-03-21 NOTE — Progress Notes (Signed)
Dr Amada Jupiter at the bedside. 2mg  Ativan bolus given IV, 1 dose Diprivan 50mg  given IV. EEG tech at bedside. Levophed titrated for BP control.

## 2016-03-21 NOTE — Progress Notes (Signed)
Patient ID: Benjamin [name], male   DOB: 10-Feb-1961, 55 y.o.   MRN: [id number]   Benjamin [name] is 55 year old male with history of systolic HF due to ischemic cardiomyopathy (EF 20-25%) S/P St Jude bi-V ICD implanted October 2016, PAFL, hypertension, diabetes mellitus, hyperlipidemia who presents with VT/VF arrest at home.   Had cath in 3/17 prior to cholecytstectomy which complex, calcified, multivessel coronary disease with high-grade obstruction in the mid LAD and distal RCA.   Admitted in April with AFL. Converted with amio. And then underwent 2-V PCI of LAD and RCA with rotoblator under Impella support.   Admitted June 2017 for volume overload in setting of recurrent AFL. Had successful A flutter ablation. Diuresed with IV lasix and transitioned to 40 mg lasix daily. He continued on amiodarone and coumadin. Discharge weight was 226 pounds.   Had been doing well just returned from trip to [place] with his wife. Was in bed 8/18 am and wife went to bathroom. She hear him making a strange sound so came out of bathroom and found him unresponsive on the bed. She called her son who was downstairs. He called 911 and immediately stadted CPR. While doing CPR he felt ICD shock. Intubated in field and brought to ER. In ER, unresponsive but had stable BP and HR. Patient did not complain of CP prior to arrest.  ICD interrogated showed VF that was just below the 200 beat threshold for a while (poor R wave detection) and finally received shock back to NSR. In ER decision made to take emergently to cath lab for coronary angiogram and beginning cooling process. Coronary angiography was stable compared to post-PCI in 4/17, suspect primary arrhythmic event.   He has been rewarmed and sedation off.  Myoclonus started yesterday and he is on Keppra now.  No meaningful response.  CT possibly suggestive of anoxic injury.  He is now off norepinephrine.  CVP 13.  Creatinine has jumped up to 1.69.   Scheduled Meds: .  ampicillin-sulbactam (UNASYN) IV  3 g Intravenous Q8H  . antiseptic oral rinse  7 mL Mouth Rinse 10 times per day  . artificial tears  1 application Both Eyes Z0C  . chlorhexidine gluconate (SAGE KIT)  15 mL Mouth Rinse BID  . clopidogrel  75 mg Per Tube Daily  . digoxin  0.0625 mg Oral Daily  . famotidine (PEPCID) IV  20 mg Intravenous Q12H  . insulin glargine  10 Units Subcutaneous Daily  . levETIRAcetam  1,500 mg Intravenous BID  . rosuvastatin  20 mg Per Tube q1800  . sodium chloride flush  10-40 mL Intracatheter Q12H  . sodium chloride flush  3 mL Intravenous Q12H  . valproate sodium  250 mg Intravenous Q8H   Continuous Infusions: . amiodarone 30 mg/hr (03/21/16 0155)  . dextrose    . norepinephrine (LEVOPHED) Adult infusion Stopped (03/21/16 0245)   PRN Meds:.sodium chloride, acetaminophen, dextrose, fentaNYL (SUBLIMAZE) injection, hydrALAZINE, labetalol, midazolam, ondansetron (ZOFRAN) IV, sodium chloride flush, sodium chloride flush     Vitals:   03/21/16 0303 03/21/16 0400 03/21/16 0500 03/21/16 0600  BP:  128/72 (!) 136/42 135/79  Pulse:  69 66 81  Resp:  19 17 (!) 22  Temp:  (!) 100.6 F (38.1 C)  97.7 F (36.5 C)  TempSrc:  Core (Comment)  Core (Comment)  SpO2: 95% 100% 100% 98%  Weight:  229 lb 4.5 oz (104 kg)    Height:        Intake/Output Summary (Last 24  hours) at 03/21/16 0726 Last data filed at 03/21/16 0600  Gross per 24 hour  Intake          1871.87 ml  Output              650 ml  Net          1221.87 ml    LABS: Basic Metabolic Panel:  Recent Labs  03/13/2016 1345  03/20/16 0435  03/21/16 0005 03/21/16 0437  NA 135  < > 139  < > 139 139  K 6.0*  < > 3.5  < > 4.1 5.0  CL 107  < > 109  < > 109 108  CO2 22  < > 23  --  21* 21*  GLUCOSE 207*  < > 150*  < > 140* 149*  BUN 23*  < > 27*  < > 28* 30*  CREATININE 1.52*  < > 1.36*  < > 1.18 1.69*  CALCIUM 7.7*  < > 8.1*  --  8.2* 8.4*  MG 1.8  --  1.9  --   --   --   PHOS 3.9  --  3.3  --   --    --   < > = values in this interval not displayed. Liver Function Tests:  Recent Labs  03/20/16 0435 03/21/16 0437  AST 60* 44*  ALT 60 55  ALKPHOS 69 70  BILITOT 0.9 1.0  PROT 5.5* 6.0*  ALBUMIN 2.9* 3.0*   No results for input(s): LIPASE, AMYLASE in the last 72 hours. CBC:  Recent Labs  03/15/2016 1015  03/20/16 2006 03/21/16 0437  WBC 12.3*  --   --  13.9*  NEUTROABS 7.6  --   --   --   HGB 13.2  < > 15.6 14.2  HCT 41.8  < > 46.0 45.3  MCV 72.8*  --   --  71.6*  PLT 233  --   --  196  < > = values in this interval not displayed. Cardiac Enzymes:  Recent Labs  03/25/2016 1345 03/09/2016 2003 03/20/16 0200  TROPONINI 0.35* 0.26* 0.21*   BNP: Invalid input(s): POCBNP D-Dimer: No results for input(s): DDIMER in the last 72 hours. Hemoglobin A1C:  Recent Labs  03/08/2016 1345  HGBA1C 7.3*   Fasting Lipid Panel:  Recent Labs  03/20/2016 1345  CHOL 95  HDL 28*  LDLCALC 46  TRIG 103  CHOLHDL 3.4   Thyroid Function Tests:  Recent Labs  03/28/2016 1345  TSH 1.491   Anemia Panel: No results for input(s): VITAMINB12, FOLATE, FERRITIN, TIBC, IRON, RETICCTPCT in the last 72 hours.  RADIOLOGY: Dg Chest Port 1 View  Result Date: 03/09/2016 CLINICAL DATA:  Central catheter placement EXAM: PORTABLE CHEST 1 VIEW COMPARISON:  Study obtained earlier in the day FINDINGS: Central catheter tip is in the superior vena cava. Nasogastric tube tip and side port are below the diaphragm. Endotracheal tube tip is 5.4 cm above the carina. Pacemaker leads are attached to the right atrium, right ventricle, and left ventricle, stable. No pneumothorax. There is atelectatic change in the left base. Lungs elsewhere clear. There is cardiomegaly. The pulmonary vascularity is within normal limits. No adenopathy. IMPRESSION: Tube and catheter positions as described without pneumothorax. Stable cardiomegaly. Left base atelectasis, unchanged. No new opacity. Electronically Signed   By: Lowella Grip III M.D.   On: 03/27/2016 14:10   Dg Chest Port 1 View  Result Date: 03/06/2016 CLINICAL DATA:  Evaluate endotracheal tube placement.  EXAM: PORTABLE CHEST 1 VIEW COMPARISON:  01/12/2016 FINDINGS: Endotracheal tube is 4.5 cm above the carina. The cardiac silhouette is enlarged. There is a left-sided biventricular ICD. Haziness and increased densities in the perihilar regions are suggestive for vascular congestion and edema. Negative for a pneumothorax. IMPRESSION: Endotracheal tube is appropriately positioned. Cardiomegaly with central vascular congestion and mild edema. Electronically Signed   By: Markus Daft M.D.   On: 03/12/2016 10:30   Ct Portable Head W/o Cm  Result Date: 03/18/2016 CLINICAL DATA:  Status post cardiac arrest today for 8 minutes. EXAM: CT HEAD WITHOUT CONTRAST TECHNIQUE: Contiguous axial images were obtained from the base of the skull through the vertex without intravenous contrast. COMPARISON:  05/21/2008. FINDINGS: Study was performed with a portable apparatus. Image quality is reduced. Brain: There is a area of 27 x 16 mm hypoattenuation in the RIGHT occipital lobe, uncertain significance and duration. This was not present in 2009, but image quality is insufficient to distinguish between an acute versus chronic infarct. Hypoattenuation due to a mass is less likely. Poor gray-white differentiation throughout the cerebral hemispheres. Anoxic- ischemic injury is not excluded. No hemorrhage, hydrocephalus, or extra-axial fluid. Mild atrophy, premature for age. Vascular: No hyperdense vessel.  Carotid siphon calcification. Skull: Negative Sinuses/Orbits: Negative Other: None. IMPRESSION: Poor gray-white differentiation throughout the cerebral hemispheres. Anoxic ischemic injury is not excluded. Superficial RIGHT occipital lobe area of hypoattenuation, approximately the 3 x 1.5 cm, could represent an acute or chronic infarct. Continued surveillance warranted. No intracranial  hemorrhage or large vessel occlusion. Electronically Signed   By: Staci Righter M.D.   On: 03/25/2016 20:16    PHYSICAL EXAM General: Intubated/sedated.  Neck: No JVD, no thyromegaly or thyroid nodule.  Lungs: Crackles dependently. CV: Lateral PMI.  Heart regular S1/S2, no S3/S4, no murmur.  No peripheral edema.   Abdomen: Soft, nontender, no hepatosplenomegaly, no distention.  Neurologic: Intubated, not responsive. Having myoclonic jerks.  Extremities: No clubbing or cyanosis.   TELEMETRY: Reviewed telemetry pt in a-paced, BiV paced  ASSESSMENT AND PLAN: 55 yo with history of systolic HF due to ischemic cardiomyopathy (EF 20-25%) S/P St Jude bi-V ICD implanted October 2016, PAFL, hypertension, diabetes mellitus, hyperlipidemia who presents with VT/VF arrest at home.  1. Cardiac arrest: VF arrest at home.  Coronary angiography this admission showed stable coronary anatomy compared to post-PCI in 4/17.  Suspect primary arrhythmic arrest.  - Continue amiodarone gtt for now.  2. Chronic systolic CHF: EF 66-59%, ischemic cardiomyopathy.  On low dose norepinephrine while hypothermic.  CVP 13.  Co-ox 61%.  - Continue digoxin. Dose lowered with elevated level.  - Would hold off on diuresis for now with acute rise in creatinine.  3. CAD: Stable anatomy on angiography. Continue Plavix, statin.  4. Atrial flutter: s/p ablation.  Had been on warfarin.  Holding warfarin for now, will use bivalirudin when INR < 2 (no heparin/Lovenox with religious beliefs).  INR 3 today.  5. Possible aspiration PNA: On Unasyn per CCM.  6. Neuro: He has been rewarmed and sedation is off.  No meaningful response. Has started to have post-anoxic myoclonus.  Prognosis for neurological recovery seems to be poor.    Loralie Champagne 03/21/2016 7:26 AM

## 2016-03-21 NOTE — Progress Notes (Signed)
LTM started. Notified Dr Jobe Gibbon. Educated nurse on patient button.

## 2016-03-21 NOTE — Progress Notes (Signed)
Pt. Became hypertensive on monitor, manual MAP was 78 with doppler. Cuff moved to leg with BP reading 107/62 and a MAP of 71.  Levophed off.   Fentanyl and versed off.  200 mg of fentanyl and 50 mg of versed wasted in sink with Hillery Aldo, RN.

## 2016-03-21 NOTE — Progress Notes (Signed)
PULMONARY / CRITICAL CARE MEDICINE   Name: Benjamin [name] MRN: [id number] DOB: 1960-09-01    ADMISSION DATE:  Apr 14, 2016 CONSULTATION DATE:  04/14/16  REFERRING MD:  Loren Racer, M.D. / EDP  CHIEF COMPLAINT:  Post Arrest  HISTORY OF PRESENT ILLNESS:  Per records patient was a witnessed arrest with CPR initiated by his son. On arrival patient was in PEA. Patient received approximately 12 minutes of CPR total as well as bolus epinephrine. Patient lost his pulse in the midst of transport to the emergency department but regained it prior to arrival. Rhythm was paced upon arrival in the emergency department. Endotracheal tube placed by EMS. Family confirm the patient did not have any purposeful movement or open his eyes at all during his resuscitation at home. Additionally his son noted that his AICD did fire as he was doing CPR. The wife found him laying on the bed, unresponsive, with abnormal respirations. She promptly called 911 & her son started CPR. PCCM was consulted for vent & medical management. Therapeutic hypothermia was initiated in the ED prior to going to the cath lab. The patient received 1L of iced saline. Patient did receive bolus bicarb during heart cath.  SUBJECTIVE:  BP has risen through the rewarming process, off pressors at this time Began to have upper body myoclonic jerking when paralytics d/c'd early am Currently off all sedation  VITAL SIGNS: BP 135/79 (BP Location: Left Leg)   Pulse 81   Temp 97.7 F (36.5 C) (Core (Comment))   Resp (!) 22   Ht 5\' 7"  (1.702 m)   Wt 104 kg (229 lb 4.5 oz)   SpO2 98%   BMI 35.91 kg/m   HEMODYNAMICS: CVP:  [3 mmHg-11 mmHg] 11 mmHg  VENTILATOR SETTINGS: Vent Mode: PRVC FiO2 (%):  [40 %] 40 % Set Rate:  [24 bmp] 24 bmp Vt Set:  [530 mL] 530 mL PEEP:  [5 cmH20] 5 cmH20 Plateau Pressure:  [16 cmH20-27 cmH20] 27 cmH20  INTAKE / OUTPUT: I/O last 3 completed shifts: In: 2780.1 [I.V.:1652.6; NG/GT:110; IV Piggyback:1017.5] Out:  1235 [Urine:1035; Emesis/NG output:200]  PHYSICAL EXAMINATION: General:  Unresponsive on MV Integument:  Warm & dry. No rash on exposed skin. No bruising.  Lymphatics:  No appreciated cervical or supraclavicular lymphadenoapthy. HEENT:  No scleral injection or icterus. Endotracheal tube in place.  Cardiovascular:  Regular rate. No edema. No appreciable JVD.  Pulmonary:  Good aeration & clear to auscultation bilaterally. Symmetric chest wall rise on ventilator. Abdomen: Soft. Normal bowel sounds. Nondistended.  Musculoskeletal:  Normal bulk. No joint deformity or effusion appreciated. Neurological:  No withdrawal to pain in extremities. Pupils 3mm and equal. Intermittent myoclonic jerking   LABS:  BMET  Recent Labs Lab 03/20/16 0435  03/20/16 2006 03/21/16 0005 03/21/16 0437  NA 139  < > 142 139 139  K 3.5  < > 3.8 4.1 5.0  CL 109  < > 104 109 108  CO2 23  --   --  21* 21*  BUN 27*  < > 29* 28* 30*  CREATININE 1.36*  < > 1.00 1.18 1.69*  GLUCOSE 150*  < > 134* 140* 149*  < > = values in this interval not displayed.  Electrolytes  Recent Labs Lab 2016-04-14 1345  03/20/16 0435 03/21/16 0005 03/21/16 0437  CALCIUM 7.7*  < > 8.1* 8.2* 8.4*  MG 1.8  --  1.9  --   --   PHOS 3.9  --  3.3  --   --   < > =  values in this interval not displayed.  CBC  Recent Labs Lab 03/18/2016 1015  03/20/16 1557 03/20/16 2006 03/21/16 0437  WBC 12.3*  --   --   --  13.9*  HGB 13.2  < > 15.0 15.6 14.2  HCT 41.8  < > 44.0 46.0 45.3  PLT 233  --   --   --  196  < > = values in this interval not displayed.  Coag's  Recent Labs Lab 03/13/2016 1345 03/09/2016 2002 03/22/2016 2003 03/20/16 0435 03/21/16 0437  APTT 34  --  37*  --   --   INR  --  2.03  --  2.43 3.08    Sepsis Markers  Recent Labs Lab 03/30/2016 1400 04/01/2016 2003 03/20/16 0200  LATICACIDVEN 2.2* 2.0* 2.3*    ABG  Recent Labs Lab 03/12/2016 1107 03/08/2016 1129 03/06/2016 1608  PHART 7.339* 7.408 7.397  PCO2ART  36.5 36.5 30.4*  PO2ART 162.0* 237.0* 195.0*    Liver Enzymes  Recent Labs Lab 03/18/2016 1015 03/20/16 0435 03/21/16 0437  AST 127* 60* 44*  ALT 86* 60 55  ALKPHOS 92 69 70  BILITOT 1.0 0.9 1.0  ALBUMIN 3.4* 2.9* 3.0*    Cardiac Enzymes  Recent Labs Lab 03/13/2016 1345 03/18/2016 2003 03/20/16 0200  TROPONINI 0.35* 0.26* 0.21*    Glucose  Recent Labs Lab 03/20/16 1208 03/20/16 1423 03/20/16 1555 03/20/16 2007 03/20/16 2349 03/21/16 0421  GLUCAP 134* 153* 142* 135* 142* 148*    Imaging No results found.   STUDIES:  PFT 12/12/15: FVC 3.06 L (68%) FEV1 2.11 L (61%) FEV1/FVC 0.69 positive bronchodilator response TLC 4.61 L (72%) RV 77% ERV 78% DLCO uncorrected 72% TTE 02/11/16: LV severely dilated. EF 15-20% w/ diffuse hypokinesis. Grade 3 diastolic dysfunction. RV mildly dilated with normal systolic function. Moderate mitral regurgitation. No aortic stenosis or regurgitation. Port CXR 8/18: Endotracheal tube in acceptable position. Suggestion of cardiomegaly. Left lower lobe opacity as well as right midlung opacity with air bronchograms suggesting consolidation. EEG 8/18 >> no over seizure activity, generalized low voltage and irregular delta activity Head CT scan 8/18 >> poor gray-white differentiation throughout hemispheres, R occipital hypo-attenuation could represent chronic or sub-acute infarct L heart cath 8/18 >> no significant change from prior study 11/2015  MICROBIOLOGY: MRSA PCR 8/18 >> Tracheal Asp Ctx 8/18 >> Blood Ctx x2 8/18 >>  ANTIBIOTICS: Unasyn 8/18 >>  SIGNIFICANT EVENTS: 8/18 - Admit post arrest w/ cooling initiated & LHC  LINES/TUBES: OETT 7.0 8/18 >> Foley 8/18 >> PIV x2  DISCUSSION:  55 year old male with known DM, HTN, DLD, & Systolic CHF (EF 63-87%15-20%). VF arrest w/ 12 min until ROSC. Initiated therapeutic hypothermia. Neurological prognosis poor based on myoclonus and abnormal CT head  ASSESSMENT / PLAN:  CARDIOVASCULAR A:  S/P  VF Arrest - 12 min downtime. Witnessed & CPR initiated by son. Shock - Likely due to medication/sedation +/- cardiogenic H/O NSTEMI/CAD H/O Systolic CHF H/O AICD Placement H/O HTN & DLD  P:  Vitals per unit protocol Continuous telemetry monitoring Trending Troponin I q6hr Amiodarone, digoxin  Systemic anticoagulation > plavix, start angiomax once INR < 2.0, (8/20 > 3.0)  NEUROLOGIC A:   Sedation on Ventilator Acute Encephalopathy - Likely from anoxia. No reported purposeful movements post arrest or currently off sedation Myoclonus, due to anoxic injury. Unlikely to be seizures P:   RASS goal: 0 33C Hypothermia Protocol completed Appreciate Neurology's assistance keppra + depakote ordered EEG pending  Consider repeat head  CT scan 8/20  PULMONARY A: Acute Hypoxic Respiratory Failure - Post arrest. Probable Aspiration - R Lung on CXR. Moderate COPD - Based on pre-bronchod spiro May 2017. Mild Restrictive Lung Disease - Based on lung vol May 2017. Likely due to cardiomyopathy.  P:   Full Vent Support OK to consider PSV but MS precludes extubation Intermittent Port CXR & ABG  RENAL A:   Acute Renal Failure Lactic Acidosis, improved  P:   Trending UOP with Foley Monitoring electrolytes & renal function, serial BMP Replacing electrolytes as indicated  GASTROINTESTINAL A:   Transaminitis - Mild. Likely due to shock.  P:   NPO Pepcid IV q12hr Trending LFTs   HEMATOLOGIC A:   Coagulopathy on Systemic Anticoagulation - Coumadin as outpatient. INR 1.9 on arrival. Leukocytosis - Likely reactive from arrest.  P:  Trending cell counts w/ CBC  INR daily SCDs  No heparin or lovenox due to religious beliefs Coumadin d/c'd, plan is to start angiomax when INR < 2 Vit K 5mg  x 1 now  INFECTIOUS A:   Probable Aspiration Pneumonia vs Pneumonitis  P:   Empiric Unasyn Day #3 Tracheal Asp Ctx  ENDOCRINE A:   H/O DM Type 2    P:   Checking Hgb A1c SSI per  Moderate algorithm Accu-Checks q4hr Holding home Lantus & Metformin  FAMILY  - Updates: Family updated by Dr Delton Coombes 8/19 and 8/20 at bedside  - Inter-disciplinary family meet or Palliative Care meeting due by:  8/25   Independent CC time 40 minutes   Levy Pupa, MD, PhD 03/21/2016, 7:18 AM [facility] [phone] or if no answer [phone]

## 2016-03-21 NOTE — Progress Notes (Signed)
Subjective: Continues to have abnormal movments.   Exam: Vitals:   03/21/16 1700 03/21/16 1800  BP: 98/66   Pulse: 60   Resp: (!) 24   Temp:  98.2 F (36.8 C)   Gen: In bed, NAD Resp: non-labored breathing, no acute distress Abd: soft, nt  Neuro: MS: does not open eyes(other than with myoclonus) or follow commands [id number], corneals present, oculocephalics absent  Motor: frequent clonic activity diffusely, no clear response to pain.  Sensory: as above  Pertinent Labs: Mildly elevated creatinine  EEG frequent GPEDs with some evolution in frequency suggestive of ictal nature.   Impression: 55 yo M with status epilepticus following cardiac arrest. The semiology does appear myoclonic which is typically thought of as a poor prognostic indicator. Given his young age, I think that it would be reasonable to attempt to control seizures with aggressive measures(e.g. Burst suppression) however if after getting them controlled, they restart,  I suspect that this would be even more supportive of poor prognosis.   Recommendations: 1) Propofol for burst suppression.  2) keppra 1000mg  BID 3) Depakote 500mg  TID 4) depakote level in the AM 5) would wean propofol tomorrow  This patient is critically ill and at significant risk of neurological worsening, death and care requires constant monitoring of vital signs, hemodynamics,respiratory and cardiac monitoring, neurological assessment, discussion with family, other specialists and medical decision making of high complexity. I spent 45 minutes of neurocritical care time  in the care of  this patient.  Ritta Slot, MD [facility] [phone]  If 7pm- 7am, please page neurology on call as listed in AMION. 03/21/2016  6:41 PM

## 2016-03-21 NOTE — Progress Notes (Signed)
Pt. Having jerking motion of whole body during rewarming while paralytic still on.  Dr. Jamison Neighbor called.  Stat EEG ordered and neuro is being consulted.  Paralytic now off and pt. Is at 36 degrees at 0030.  Pt. Still has no cough, gag, or corneal reflex.  Versed and fentanyl will be continued until neuro wants to stop.

## 2016-03-21 NOTE — Progress Notes (Signed)
eLink Physician-Brief Progress Note Patient Name: Benjamin [name] DOB: 05/23/1961 MRN: [id number]   Date of Service  03/21/2016  HPI/Events of Note  Notified by bedside nurse of significant hypertension off of vasopressor infusion. Nursing staff at bedside reportedly unable to auscultate sounds to obtain manual blood pressure. Previously unsuccessful on 3 separate attempts to obtain arterial line.   eICU Interventions  1. Change in blood pressure cuff 2. Attempt lower extremity measurement of blood pressure 3. Labetalol & hydralazine IV when necessary to maintain systolic blood pressure less than 165 4. Consider CT of head if blood pressure is truly elevated significantly     Intervention Category Intermediate Interventions: Hypertension - evaluation and management  Lawanda Cousins 03/21/2016, 2:50 AM

## 2016-03-21 NOTE — Consult Note (Addendum)
Neurology Consult Note  Reason for Consultation: Jerking following cardiac arrest  Requesting provider: Tera Partridge, MD  CC: None, patient is comatose and unable to provide  HPI: This is a 55 year old man in the ICU who is status post cardiac arrest. History is obtained from detailed review of the patient's medical record.  The patient was admitted following an out-of-hospital cardiac arrest. According to notes, he was in his usual state of health until the morning of 03/24/2016. He and his wife were in bed when she got up to go to the bathroom. She reported hearing him make a strange noise and came out of the bathroom to check on him, at which time she apparently found him laying on the bed, unresponsive with abnormal respirations. She called 911 and their son initiated CPR and reported feeling the patient's ICD discharge while he was performing compressions. EMS arrived to find an initial rhythm of PEA. ACLS was initiated and the patient was intubated in the field. He received CPR in addition to 2 mg of epinephrine and reportedly regained pulses after 12 minutes of resuscitation. However, during transport to the emergency department, he again lost pulses briefly with ROSC obtained after 4 minutes of CPR and an additional 2 mg of epinephrine. On arrival in the emergency department, he had a paced rhythm of 60 beats per minute with blood pressure 138/96. Therapeutic hypothermia was initiated in the emergency department. Cardiology interrogated his ICD and noted that this showed ventricular fibrillation before he was shocked back to a normal sinus rhythm during resuscitation. He was taken emergently to the cath lab where angiography did not show any significant changes when compared to a prior angiogram in April 2017. It was thus felt that his arrest was not due to acute ischemia but rather resulted from a primary arrhythmia. He was admitted to the ICU on the hypothermia protocol. STAT EEG was obtained and  showed very low voltage generalized irregular delta activity without evidence of epileptiform discharges or seizure.   Rewarming was started at 1500 on 03/20/16. At about midnight, his nurse observed that he was having jerking movements of his body after she discontinued his paralytic. He did not have any gaze deviation and pupils were noted to be symmetric without dilatation. His jerking was described as intermittent jerking of his entire body without focality. He remains on a Versed drip at the time of this activity. Neurology is now being consulted for further recommendations due to concern about possible seizure activity.  Most recent temperature at the time of this consult 35.8C  Pertinent medications:  Cisatracurium drip, stopped 03/21/16 at 0024  Fentanyl drip, currently running 150 mcg/hr  Midazolam drip, currently running at 3 mg/hr  Norepinephrine drip, currently running at 6 mg/min  Pertinent labs:  AST 127 and ALT 86 on 03/09/2016; AST 60, ALT 60 on 03/20/16  Creatinine 1.18 03/21/16 at 0005  Glucose 142 03/20/16 at 2349  PMH: Limited to chart review as patient is unable to provide and no family is present.  Past Medical History:  Diagnosis Date  . AICD (automatic cardioverter/defibrillator) present   . Cholelithiasis   . Chronic systolic heart failure ([place])   . Coronary artery disease    distal vessel disease  . GERD (gastroesophageal reflux disease)   . High cholesterol   . Hypertension   . Kidney stone   . NSTEMI (non-ST elevated myocardial infarction) ([place])   . Pneumonia 2002  . Type II diabetes mellitus ([facility])     PSH:  Limited to chart review as patient is unable to provide and no family is present.  Past Surgical History:  Procedure Laterality Date  . CARDIAC CATHETERIZATION N/A 10/07/2015   Procedure: Left Heart Cath and Coronary Angiography;  Surgeon: Belva Crome, MD;  Location: [facility] [facility];  Service: Cardiovascular;  Laterality: N/A;  . CARDIAC  CATHETERIZATION N/A 11/05/2015   Procedure: Coronary Stent Intervention Rotablater;  Surgeon: Belva Crome, MD;  Location: [place] [facility];  Service: Cardiovascular;  Laterality: N/A;  . CARDIAC CATHETERIZATION N/A 03/14/2016   Procedure: Left Heart Cath and Coronary Angiography;  Surgeon: Leonie Man, MD;  Location: [place] [facility];  Service: Cardiovascular;  Laterality: N/A;  . CHOLECYSTECTOMY  10/09/2015   Procedure: LAPAROSCOPIC CHOLECYSTECTOMY;  Surgeon: Fanny Skates, MD;  Location: [facility];  Service: General;;  . COLONOSCOPY N/A 07/12/2013   Procedure: COLONOSCOPY;  Surgeon: Lear Ng, MD;  Location: [facility];  Service: Endoscopy;  Laterality: N/A;  . ELECTROPHYSIOLOGIC STUDY N/A 01/23/2016   Procedure: A-Flutter/A-Tach/SVT Ablation;  Surgeon: Evans Lance, MD;  Location: [place] [facility];  Service: Cardiovascular;  Laterality: N/A;  . EP IMPLANTABLE DEVICE N/A 05/09/2015   Procedure: BiV ICD Insertion CRT-D;  Surgeon: Evans Lance, MD;  Location: [place] [facility];  Service: Cardiovascular;  Laterality: N/A;    Family history: Limited to chart review as patient is unable to provide and no family is present.  Family History  Problem Relation Age of Onset  . Diabetes Mother   . Hypertension Mother   . Hyperlipidemia Mother   . Stroke Mother   . Diabetes Father   . Hypertension Father   . Stroke Father 71    Social history: Limited to chart review as patient is unable to provide and no family is present.  Social History   Social History  . Marital status: Married    Spouse name: N/A  . Number of children: N/A  . Years of education: N/A   Occupational History  . Not on file.   Social History Main Topics  . Smoking status: Never Smoker  . Smokeless tobacco: Never Used  . Alcohol use No  . Drug use: No  . Sexual activity: No   Other Topics Concern  . Not on file   Social History Narrative  . No narrative on file    Current outpatient  meds: Current Meds  Medication Sig  . amiodarone (PACERONE) 200 MG tablet Take 1 tablet (200 mg total) by mouth 2 (two) times daily.  . carvedilol (COREG) 3.125 MG tablet Take 1 tablet (3.125 mg total) by mouth 2 (two) times daily with a meal.  . clopidogrel (PLAVIX) 75 MG tablet Take 1 tablet (75 mg total) by mouth daily.  . digoxin (LANOXIN) 0.125 MG tablet Take 1 tablet (0.125 mg total) by mouth daily.  . furosemide (LASIX) 20 MG tablet Take 40 mg by mouth daily.  Marland Kitchen glucose blood (ACCU-CHEK AVIVA) test strip Check Blood glucose three times daily. (Patient taking differently: 1 each by Other route See admin instructions. Check blood sugar once daily)  . insulin aspart (NOVOLOG FLEXPEN) 100 UNIT/ML FlexPen Inject 5-10 Units into the skin 2 (two) times daily as needed for high blood sugar (CBG >140). Dose is based on size of meal  . Insulin Glargine (LANTUS SOLOSTAR) 100 UNIT/ML Solostar Pen Inject 20 Units into the skin daily at 10 pm.  . Elmore Guise Devices (ACCU-CHEK SOFTCLIX) lancets Check blood glucose three times  daily. (Patient taking differently: 1 each by Other route See admin instructions. Check blood sugar once daily)  . nitroGLYCERIN (NITROSTAT) 0.4 MG SL tablet Place 1 tablet (0.4 mg total) under the tongue every 5 (five) minutes x 3 doses as needed for chest pain.  . podofilox (CONDYLOX) 0.5 % external solution Apply topically 2 (two) times daily. For 3 days then off for 4 days; repeat every week for 4 weeks. (Patient taking differently: Apply 1 application topically See admin instructions. Apply twice daily for 3 days then off for 4 days; repeat every week for 4 weeks.)  . potassium chloride SA (K-DUR,KLOR-CON) 20 MEQ tablet Take 20 mEq by mouth daily.  . rosuvastatin (CRESTOR) 20 MG tablet Take 1 tablet (20 mg total) by mouth daily.  . sacubitril-valsartan (ENTRESTO) 24-26 MG Take 1 tablet by mouth 2 (two) times daily.  . traMADol (ULTRAM) 50 MG tablet Take 1 tablet (50 mg total) by mouth  every 6 (six) hours as needed. (pain) (Patient taking differently: Take 50 mg by mouth every 6 (six) hours as needed (pain). )  . warfarin (COUMADIN) 2.5 MG tablet Take 1.25-2.5 mg by mouth See admin instructions. Per anti-coag visit 03/10/16: take 1 tablet (2.5 mg) by mouth daily on Mondays, take 1/2 tablet (1.25 mg) on all other days of the week    Current inpatient meds:  Current Facility-Administered Medications  Medication Dose Route Frequency Provider Last Rate Last Dose  . 0.9 %  sodium chloride infusion  250 mL Intravenous PRN Leonie Man, MD 10 mL/hr at 03/20/16 2020 250 mL at 03/20/16 2020  . acetaminophen (TYLENOL) tablet 650 mg  650 mg Oral Q4H PRN Leonie Man, MD      . amiodarone (NEXTERONE PREMIX) 360-4.14 MG/200ML-% (1.8 mg/mL) IV infusion  30 mg/hr Intravenous Continuous Leonie Man, MD 16.7 mL/hr at 03/20/16 2000 30 mg/hr at 03/20/16 2000  . Ampicillin-Sulbactam (UNASYN) 3 g in sodium chloride 0.9 % 100 mL IVPB  3 g Intravenous Q8H Otilio Miu, RPH   3 g at 03/20/16 2047  . antiseptic oral rinse solution (CORINZ)  7 mL Mouth Rinse 10 times per day Javier Glazier, MD   7 mL at 03/20/16 2349  . artificial tears (LACRILUBE) ophthalmic ointment 1 application  1 application Both Eyes P1W Javier Glazier, MD   1 application at 25/85/27 2201  . chlorhexidine gluconate (SAGE KIT) (PERIDEX) 0.12 % solution 15 mL  15 mL Mouth Rinse BID Javier Glazier, MD   15 mL at 03/20/16 1957  . cisatracurium (NIMBEX) 200 mg in sodium chloride 0.9 % 200 mL (1 mg/mL) infusion  1-1.5 mcg/kg/min Intravenous Continuous Javier Glazier, MD   Stopped at 03/21/16 0024   And  . cisatracurium (NIMBEX) bolus via infusion 4.8 mg  0.05 mg/kg Intravenous PRN Javier Glazier, MD      . clopidogrel (PLAVIX) tablet 75 mg  75 mg Per Tube Daily Leonie Man, MD   75 mg at 03/20/16 0954  . dextrose 10 % infusion   Intravenous Continuous PRN Javier Glazier, MD      . digoxin [facility])  tablet 0.0625 mg  0.0625 mg Oral Daily Larey Dresser, MD   0.0625 mg at 03/20/16 0953  . famotidine (PEPCID) IVPB 20 mg premix  20 mg Intravenous Q12H Javier Glazier, MD   20 mg at 03/20/16 2201  . fentaNYL (SUBLIMAZE) 2,500 mcg in sodium chloride 0.9 % 250 mL (10 mcg/mL) infusion  100-300 mcg/hr Intravenous Continuous Javier Glazier, MD 15 mL/hr at 03/20/16 2129 150 mcg/hr at 03/20/16 2129  . fentaNYL (SUBLIMAZE) bolus via infusion 50 mcg  50 mcg Intravenous Q30 min PRN Javier Glazier, MD      . insulin glargine (LANTUS) injection 10 Units  10 Units Subcutaneous Daily Javier Glazier, MD   10 Units at 03/20/16 [id number]  . midazolam (VERSED) 50 mg in sodium chloride 0.9 % 50 mL (1 mg/mL) infusion  2-10 mg/hr Intravenous Continuous Javier Glazier, MD 3 mL/hr at 03/20/16 2000 3 mg/hr at 03/20/16 2000  . midazolam (VERSED) bolus via infusion 2 mg  2 mg Intravenous Q30 min PRN Javier Glazier, MD      . norepinephrine (LEVOPHED) 16 mg in dextrose 5 % 250 mL (0.064 mg/mL) infusion  0-50 mcg/min Intravenous Titrated Javier Glazier, MD 5.6 mL/hr at 03/20/16 2208 6 mcg/min at 03/20/16 2208  . ondansetron (ZOFRAN) injection 4 mg  4 mg Intravenous Q6H PRN Leonie Man, MD      . rosuvastatin (CRESTOR) tablet 20 mg  20 mg Per Tube q1800 Leonie Man, MD   20 mg at 03/20/16 1802  . sodium chloride flush (NS) 0.9 % injection 10-40 mL  10-40 mL Intracatheter Q12H Javier Glazier, MD   10 mL at 03/20/16 2200  . sodium chloride flush (NS) 0.9 % injection 10-40 mL  10-40 mL Intracatheter PRN Javier Glazier, MD   30 mL at 03/31/2016 2219  . sodium chloride flush (NS) 0.9 % injection 3 mL  3 mL Intravenous Q12H Leonie Man, MD   3 mL at 03/20/16 2200  . sodium chloride flush (NS) 0.9 % injection 3 mL  3 mL Intravenous PRN Leonie Man, MD        Allergies: Allergies  Allergen Reactions  . Heparin Other (See Comments)    Will not use pork-derived products due to religious beliefs    . Lovenox [Enoxaparin Sodium] Other (See Comments)    Will not use pork-derived products due to religious beliefs   . Pork-Derived Products Other (See Comments)    Does not eat pork or pork derived products d/t religious reasons    ROS: Unable to obtain as the patient is presently comatose and unable to provide.   PE:  BP 104/76 (BP Location: Left Arm)   Pulse 60   Temp (!) 96.4 F (35.8 C) (Oral)   Resp (!) 24   Ht _0  (1.702 m)   Wt 95.7 kg (211 lb)   SpO2 100%   BMI 33.05 kg/m   General: WDWN lying in ICU bed. He has intermittent brief (<1 sec) episodes of spontaneous irregular generalized jerking of the body. He does not open his eyes spontaneously or with stimulation. He is unresponsive to verbal, tactile, and noxious stimuli.  HEENT: Normocephalic. Neck supple. ETT and OGT in place. Sclerae anicteric. No conjunctival injection.  CV: Regular, no murmur. Distal pulses 2+ and symmetric.  Lungs: Ventilated. CTAB on anterior exam.  Abdomen: Soft. Cooling pads in place.   Extremities: No C/C/E. Cooling pads in place.  Neuro:  CN: Pupils are approximately 1 mm, equal and round, weakly reactive to light. He does not blink to visual threat. Eyes are midline, no deviation and no nystagmus. Oculocephalics are absent. Corneals are absent. His face is grossly symmetric but the lower portion is obscured by tubes and tape. He has no response to supraorbital pressure on either side. The  remainder of his cranial nerves cannot be accurately assessed as he is unable to participate with the exam.  Motor: Normal bulk. Flaccid tone throughout. Frequent episodes of irregular low amplitude generalized myoclonic jerking is observed, greatest in the arms, upper torso, and head with lesser involvement of the legs. The frequency and intensity of his myoclonus are unaffected by stimulation. No purposeful movement is seen.  Sensation: He has no response to nailbed pressure x4. He has slight shoulder  adduction with central pain.   DTRs: 2+, symmetric. Toes mute. Coordination and gait: These cannot be assessed as the patient is comatose and unable to participate with the exam.   Labs:  Lab Results  Component Value Date   WBC 12.3 (H) 03/06/2016   HGB 15.6 03/20/2016   HCT 46.0 03/20/2016   PLT 233 03/22/2016   GLUCOSE 140 (H) 03/21/2016   CHOL 95 03/03/2016   TRIG 103 03/02/2016   HDL 28 (L) 03/18/2016   LDLCALC 46 03/09/2016   ALT 60 03/20/2016   AST 60 (H) 03/20/2016   NA 139 03/21/2016   K 4.1 03/21/2016   CL 109 03/21/2016   CREATININE 1.18 03/21/2016   BUN 28 (H) 03/21/2016   CO2 21 (L) 03/21/2016   TSH 1.491 03/18/2016   INR 2.43 03/20/2016   HGBA1C 7.3 (H) 03/26/2016   Digoxin level 1.2 03/20/16 Magnesium 1.9 03/20/16 Phosphorus 3.3 03/20/16 Lactate 2.3 03/20/16  Imaging: I have personally and independently reviewed the [facility] without contrast from 03/23/2016. This is a portable exam and thus the images are of low quality. Subtle abnormalities may not be detected on this scan. There is a focal wedge-shaped area of hypodensity in the right occipital lobe. This is most consistent with an area of ischemia of uncertain chronicity and it could be either subacute or chronic.   Assessment and Plan: This patient is critically ill, comatose in the ICU requiring mechanical ventilation and pressors.   1. Anoxic brain injury: This is acute, due to OOH cardiac arrest. EMS reported PEA but vfib noted on interrogation of ICD. He received immediate bystander CPR with initial ROSC after 12 minutes of ACLS with a second arrest during transport to ED requiring an additional 4 minutes of resuscitation. This appears to be a severe injury. Treatment is supportive as noted below.   2. Anoxic encephalopathy: This is acute, due to anoxic brain injury from cardiac arrest. His current examination is poor with absent oculocephalics, absent corneals, sluggish pupils and minimal response to pain. However,  he remains hypothermic and heavily sedated with cisatracurium infusion just turned off about 45 minutes prior to my exam. Thus my exam must be interpreted with caution. Continue with supportive care. Avoid hypoxemia and hypotension for even brief intervals as these are associated with worse neurologic outcomes. Fever and hyperglycemia must be aggressively treated for the same reason. Continue to wean sedation as tolerated. We will need to follow his exam as he continues to rewarm and as his sedation is tapered to determine neurologic prognosis.   3. Myoclonus: His exam is consistent with postanoxic myoclonus in the setting of anoxic brain injury. This is generalized with no associated autonomic signs to suggest seizure. This may intensify as the paralytics continue to exit his system and as his other sedation is weaned. I will start Keppra 1500 mg BID and obtain routine EEG. If myoclonus persists despite Keppra, may consider adding valproic acid.   4. Ischemic stroke: CT of the head shows an area of infarction in  the right occipital lobe. The scan is suboptimal and the age of this infarct cannot be determined. It appears well demarcated and quite hypodense suggesting a chronic insult but it could also be subacute. Treatment is supportive. Continue clopidogrel and rosuvastatin.   Thank you for the opportunity to participate in Mr. Graefe care. Please don't hesitate to call with any questions or concerns. Neurology will continue to follow along with you.   A total of 50 minutes of critical care time was spent on this consultation.    Neurology Addendum:  Patient still with frequent myoclonic jerks despite Keppra 1500 mg. I will add Depacon 250 mg q8 in hopes of decreasing this activity. The goal of treating his myoclonus is largely to decrease the distress this may cause family and caregivers as it is often disturbing to witness. Doubt this is epileptic in origin.

## 2016-03-22 ENCOUNTER — Inpatient Hospital Stay (HOSPITAL_COMMUNITY): Payer: Medicaid Other

## 2016-03-22 DIAGNOSIS — G931 Anoxic brain damage, not elsewhere classified: Secondary | ICD-10-CM

## 2016-03-22 DIAGNOSIS — R569 Unspecified convulsions: Secondary | ICD-10-CM

## 2016-03-22 LAB — GLUCOSE, CAPILLARY
GLUCOSE-CAPILLARY: 129 mg/dL — AB (ref 65–99)
GLUCOSE-CAPILLARY: 267 mg/dL — AB (ref 65–99)
Glucose-Capillary: 106 mg/dL — ABNORMAL HIGH (ref 65–99)
Glucose-Capillary: 122 mg/dL — ABNORMAL HIGH (ref 65–99)
Glucose-Capillary: 130 mg/dL — ABNORMAL HIGH (ref 65–99)
Glucose-Capillary: 97 mg/dL (ref 65–99)
Glucose-Capillary: 99 mg/dL (ref 65–99)

## 2016-03-22 LAB — CARBOXYHEMOGLOBIN
CARBOXYHEMOGLOBIN: 1.3 % (ref 0.5–1.5)
METHEMOGLOBIN: 1 % (ref 0.0–1.5)
O2 SAT: 72.5 %
TOTAL HEMOGLOBIN: 12.8 g/dL — AB (ref 13.5–18.0)

## 2016-03-22 LAB — CBC
HCT: 40.3 % (ref 39.0–52.0)
Hemoglobin: 12.9 g/dL — ABNORMAL LOW (ref 13.0–17.0)
MCH: 22.5 pg — AB (ref 26.0–34.0)
MCHC: 32 g/dL (ref 30.0–36.0)
MCV: 70.2 fL — AB (ref 78.0–100.0)
PLATELETS: 184 10*3/uL (ref 150–400)
RBC: 5.74 MIL/uL (ref 4.22–5.81)
RDW: 21.8 % — AB (ref 11.5–15.5)
WBC: 11.2 10*3/uL — ABNORMAL HIGH (ref 4.0–10.5)

## 2016-03-22 LAB — BASIC METABOLIC PANEL
Anion gap: 10 (ref 5–15)
BUN: 30 mg/dL — AB (ref 6–20)
CALCIUM: 8.1 mg/dL — AB (ref 8.9–10.3)
CHLORIDE: 108 mmol/L (ref 101–111)
CO2: 21 mmol/L — AB (ref 22–32)
CREATININE: 1.48 mg/dL — AB (ref 0.61–1.24)
GFR calc non Af Amer: 52 mL/min — ABNORMAL LOW (ref >60)
Glucose, Bld: 133 mg/dL — ABNORMAL HIGH (ref 65–99)
Potassium: 3.7 mmol/L (ref 3.5–5.1)
Sodium: 139 mmol/L (ref 135–145)

## 2016-03-22 LAB — HEPATIC FUNCTION PANEL
ALK PHOS: 57 U/L (ref 38–126)
ALT: 36 U/L (ref 17–63)
AST: 35 U/L (ref 15–41)
Albumin: 2.6 g/dL — ABNORMAL LOW (ref 3.5–5.0)
BILIRUBIN DIRECT: 0.3 mg/dL (ref 0.1–0.5)
BILIRUBIN TOTAL: 0.7 mg/dL (ref 0.3–1.2)
Indirect Bilirubin: 0.4 mg/dL (ref 0.3–0.9)
Total Protein: 5.7 g/dL — ABNORMAL LOW (ref 6.5–8.1)

## 2016-03-22 LAB — APTT
APTT: 36 s (ref 24–36)
APTT: 80 s — AB (ref 24–36)
APTT: 91 s — AB (ref 24–36)
aPTT: 98 seconds — ABNORMAL HIGH (ref 24–36)

## 2016-03-22 LAB — FIBRINOGEN: Fibrinogen: 594 mg/dL — ABNORMAL HIGH (ref 210–475)

## 2016-03-22 LAB — PROTIME-INR
INR: 1.57
PROTHROMBIN TIME: 18.9 s — AB (ref 11.4–15.2)

## 2016-03-22 MED ORDER — SODIUM CHLORIDE 0.9 % IV SOLN
0.0600 mg/kg/h | INTRAVENOUS | Status: DC
  Administered 2016-03-22: 0.15 mg/kg/h via INTRAVENOUS
  Administered 2016-03-23 – 2016-03-26 (×5): 0.05 mg/kg/h via INTRAVENOUS
  Filled 2016-03-22 (×6): qty 250

## 2016-03-22 MED ORDER — PROPOFOL 1000 MG/100ML IV EMUL
5.0000 ug/kg/min | INTRAVENOUS | Status: DC
  Administered 2016-03-22 (×2): 25 ug/kg/min via INTRAVENOUS
  Administered 2016-03-23 (×5): 50 ug/kg/min via INTRAVENOUS
  Administered 2016-03-23 (×2): 25 ug/kg/min via INTRAVENOUS
  Administered 2016-03-24 (×2): 50 ug/kg/min via INTRAVENOUS
  Filled 2016-03-22 (×12): qty 100

## 2016-03-22 NOTE — Progress Notes (Signed)
Pt head checked; no skin breakdown noted. All leads still under 5 [place].

## 2016-03-22 NOTE — Progress Notes (Signed)
Subjective:  Benjamin [name] continues on burst suppression for mild clonus secondary to likely anoxic event.  Exam: Vitals:   03/22/16 0800 03/22/16 0815  BP: 97/69 102/70  Pulse: 60 60  Resp: (!) 24 (!) 24  Temp: 99.5 F (37.5 C)     HEENT-  Normocephalic, no lesions, without obvious abnormality.  Normal external eye and conjunctiva.  Normal TM's bilaterally.  Normal auditory canals and external ears. Normal external nose, mucus membranes and septum.  Normal pharynx. Cardiovascular- regular rate and rhythm, S1, S2 normal, no murmur, click, rub or gallop, pulses palpable throughout   Lungs- chest clear, no wheezing, rales, normal symmetric air entry, Heart exam - S1, S2 normal, no murmur, no gallop, rate regular Abdomen- soft, non-tender; bowel sounds normal; no masses,  no organomegaly Extremities- less then 2 second capillary refill     Gen: In bed, NAD MS: Intubated, in burst suppression. CN: Intubated, no facial asymmetry. Motor: No motor response. Sensory: Unable to assess.   Pertinent Labs/Diagnostics: Reviewed.    Impression:   Benjamin [name] is presently in burst suppression. This can be weaned off today. He continues on the Keppra and Depakote. The EEG was reviewed this morning. It reveals significant burst suppression with minimal intervening burst of activity which is very low in amplitude.   Recommendations:  1. Wean off burst suppression today.  2. MRI when possible.   Addley Ballinger A. Hilda Blades, M.D. Neurohospitalist Phone: [phone]   03/22/2016, 8:43 AM

## 2016-03-22 NOTE — Progress Notes (Signed)
Patient ID: Benjamin [name], male   DOB: 1960-11-19, 55 y.o.   MRN: [id number]   Had been doing well just returned from trip to [place] with his wife. Was in bed 8/18 am and wife went to bathroom. She hear him making a strange sound so came out of bathroom and found him unresponsive on the bed. She called her son who was downstairs. He called 911 and immediately stadted CPR. While doing CPR he felt ICD shock. Intubated in field and brought to ER. In ER, unresponsive but had stable BP and HR. Patient did not complain of CP prior to arrest.  ICD interrogated showed VF that was just below the 200 beat threshold for a while (poor R wave detection) and finally received shock back to NSR. In ER decision made to take emergently to cath lab for coronary angiogram and beginning cooling process. Coronary angiography was stable compared to post-PCI in 4/17, suspect primary arrhythmic event.   Over the weekend had Myoclonus is on Keppra now.  No meaningful response.  CT possibly suggestive of anoxic injury.   Yesterday he was started on norepi 18 mcg + propofol. Neurology following.EEG underway. Remains intubated 40%.     Scheduled Meds: . ampicillin-sulbactam (UNASYN) IV  3 g Intravenous Q8H  . antiseptic oral rinse  7 mL Mouth Rinse 10 times per day  . artificial tears  1 application Both Eyes C3J  . chlorhexidine gluconate (SAGE KIT)  15 mL Mouth Rinse BID  . clopidogrel  75 mg Per Tube Daily  . digoxin  0.0625 mg Oral Daily  . famotidine (PEPCID) IV  20 mg Intravenous Q12H  . insulin aspart  0-15 Units Subcutaneous Q4H  . insulin glargine  10 Units Subcutaneous Daily  . levETIRAcetam  1,000 mg Intravenous BID  . rosuvastatin  20 mg Per Tube q1800  . sodium chloride flush  10-40 mL Intracatheter Q12H  . sodium chloride flush  3 mL Intravenous Q12H  . valproate sodium  500 mg Intravenous Q8H   Continuous Infusions: . amiodarone 30 mg/hr (03/22/16 0700)  . bivalirudin (ANGIOMAX) infusion 0.5 mg/mL  (Non-ACS indications) 0.15 mg/kg/hr (03/22/16 0700)  . dextrose    . norepinephrine (LEVOPHED) Adult infusion 18 mcg/min (03/22/16 0700)  . propofol (DIPRIVAN) infusion 80 mcg/kg/min (03/22/16 0700)   PRN Meds:.sodium chloride, acetaminophen, dextrose, fentaNYL (SUBLIMAZE) injection, hydrALAZINE, labetalol, midazolam, ondansetron (ZOFRAN) IV, sodium chloride flush, sodium chloride flush     Vitals:   03/22/16 0630 03/22/16 0645 03/22/16 0700 03/22/16 0706  BP: 99/68 91/60 (!) 90/59   Pulse: 60 60 60 60  Resp: (!) 24 (!) 24 (!) 24 (!) 24  Temp:   98.8 F (37.1 C)   TempSrc:   Core (Comment)   SpO2: 100% 100% 100% 100%  Weight:      Height:        Intake/Output Summary (Last 24 hours) at 03/22/16 0720 Last data filed at 03/22/16 0700  Gross per 24 hour  Intake          2496.81 ml  Output             1788 ml  Net           708.81 ml    LABS: Basic Metabolic Panel:  Recent Labs  03/24/2016 1345  03/20/16 0435  03/21/16 0437 03/22/16 0417  NA 135  < > 139  < > 139 139  K 6.0*  < > 3.5  < > 5.0 3.7  CL 107  < >  109  < > 108 108  CO2 22  < > 23  < > 21* 21*  GLUCOSE 207*  < > 150*  < > 149* 133*  BUN 23*  < > 27*  < > 30* 30*  CREATININE 1.52*  < > 1.36*  < > 1.69* 1.48*  CALCIUM 7.7*  < > 8.1*  < > 8.4* 8.1*  MG 1.8  --  1.9  --   --   --   PHOS 3.9  --  3.3  --   --   --   < > = values in this interval not displayed. Liver Function Tests:  Recent Labs  03/21/16 0437 03/22/16 0417  AST 44* 35  ALT 55 36  ALKPHOS 70 57  BILITOT 1.0 0.7  PROT 6.0* 5.7*  ALBUMIN 3.0* 2.6*   No results for input(s): LIPASE, AMYLASE in the last 72 hours. CBC:  Recent Labs  03/11/2016 1015  03/21/16 0437 03/22/16 0417  WBC 12.3*  --  13.9* 11.2*  NEUTROABS 7.6  --   --   --   HGB 13.2  < > 14.2 12.9*  HCT 41.8  < > 45.3 40.3  MCV 72.8*  --  71.6* 70.2*  PLT 233  --  196 184  < > = values in this interval not displayed. Cardiac Enzymes:  Recent Labs  03/10/2016 1345  03/30/2016 2003 03/20/16 0200  TROPONINI 0.35* 0.26* 0.21*   BNP: Invalid input(s): POCBNP D-Dimer: No results for input(s): DDIMER in the last 72 hours. Hemoglobin A1C:  Recent Labs  03/09/2016 1345  HGBA1C 7.3*   Fasting Lipid Panel:  Recent Labs  03/23/2016 1345 03/21/16 1245  CHOL 95  --   HDL 28*  --   LDLCALC 46  --   TRIG 103 150*  CHOLHDL 3.4  --    Thyroid Function Tests:  Recent Labs  03/20/2016 1345  TSH 1.491   Anemia Panel: No results for input(s): VITAMINB12, FOLATE, FERRITIN, TIBC, IRON, RETICCTPCT in the last 72 hours.  RADIOLOGY: Ct Head Wo Contrast  Result Date: 03/21/2016 CLINICAL DATA:  Ventricular fibrillation arrest at home. Post anoxic myoclonus. No meaningful response to external stimuli. EXAM: CT HEAD WITHOUT CONTRAST TECHNIQUE: Contiguous axial images were obtained from the base of the skull through the vertex without intravenous contrast. COMPARISON:  Portable CT scan 03/23/2016. FINDINGS: Brain: Muted gray-white differentiation throughout the cerebral hemispheres, without frank reversal of the normal attenuation relationship between gray and white matter, nevertheless concerning for early hypoxic ischemic injury. Chronic RIGHT occipital infarct, well-demarcated, and stable. No secondary signs of increased intracranial pressure. Premature for age atrophy. No hemorrhage, mass lesion, or extra-axial fluid. Vascular: Vascular calcification.  No hyperdense vessel proximally. Skull: Unremarkable. Asymmetric thickness and hyperattenuation of the scalp soft tissues over the LEFT convexity, suggesting scalp hematoma, new/increased from portable CT. Sinuses/Orbits: Layering fluid in the sphenoid and mastoids reflect recumbency and intubation. Other: None. IMPRESSION: Muted gray-white differentiation similar to portable CT scan performed on 08/18. Early hypoxic ischemic injury is not excluded. No signs of large vessel occlusion, increased intracranial pressure, or  intracranial hemorrhage. Continued surveillance is warranted. Electronically Signed   By: Staci Righter M.D.   On: 03/21/2016 09:44   Dg Chest Port 1 View  Result Date: 03/22/2016 CLINICAL DATA:  Acute respiratory failure EXAM: PORTABLE CHEST 1 VIEW COMPARISON:  Chest radiograph 03/21/2016 FINDINGS: Endotracheal tube tip is at the level of the clavicular heads. A right IJ central venous catheter overlies  the lower SVC. Unchanged appearance of left chest wall pacemaker/AICD. Aeration of the lungs has improved. There is persistent pulmonary edema, decreased from the prior study. Small left pleural effusion. No pneumothorax. Persistent cardiomegaly. IMPRESSION: Mildly improved lung inflation with decreased pulmonary edema. Electronically Signed   By: Ulyses Jarred M.D.   On: 03/22/2016 05:58   Dg Chest Port 1 View  Result Date: 03/21/2016 CLINICAL DATA:  Acute respiratory failure. EXAM: PORTABLE CHEST 1 VIEW COMPARISON:  03/21/2016. FINDINGS: The heart remains enlarged. BILATERAL pulmonary opacities favored to represent pulmonary edema, worse from priors. Endotracheal tube tip satisfactory position, 3.9 cm above carina. Dual lead pacer unchanged. Central venous catheter tip proximal RIGHT atrium from RIGHT IJ approach. IMPRESSION: Worsening aeration.  Cardiomegaly with pulmonary edema. Electronically Signed   By: Staci Righter M.D.   On: 03/21/2016 07:43   Dg Chest Port 1 View  Result Date: 03/28/2016 CLINICAL DATA:  Central catheter placement EXAM: PORTABLE CHEST 1 VIEW COMPARISON:  Study obtained earlier in the day FINDINGS: Central catheter tip is in the superior vena cava. Nasogastric tube tip and side port are below the diaphragm. Endotracheal tube tip is 5.4 cm above the carina. Pacemaker leads are attached to the right atrium, right ventricle, and left ventricle, stable. No pneumothorax. There is atelectatic change in the left base. Lungs elsewhere clear. There is cardiomegaly. The pulmonary  vascularity is within normal limits. No adenopathy. IMPRESSION: Tube and catheter positions as described without pneumothorax. Stable cardiomegaly. Left base atelectasis, unchanged. No new opacity. Electronically Signed   By: Lowella Grip III M.D.   On: 03/07/2016 14:10   Dg Chest Port 1 View  Result Date: 03/28/2016 CLINICAL DATA:  Evaluate endotracheal tube placement. EXAM: PORTABLE CHEST 1 VIEW COMPARISON:  01/12/2016 FINDINGS: Endotracheal tube is 4.5 cm above the carina. The cardiac silhouette is enlarged. There is a left-sided biventricular ICD. Haziness and increased densities in the perihilar regions are suggestive for vascular congestion and edema. Negative for a pneumothorax. IMPRESSION: Endotracheal tube is appropriately positioned. Cardiomegaly with central vascular congestion and mild edema. Electronically Signed   By: Markus Daft M.D.   On: 03/05/2016 10:30   Ct Portable Head W/o Cm  Result Date: 03/18/2016 CLINICAL DATA:  Status post cardiac arrest today for 8 minutes. EXAM: CT HEAD WITHOUT CONTRAST TECHNIQUE: Contiguous axial images were obtained from the base of the skull through the vertex without intravenous contrast. COMPARISON:  05/21/2008. FINDINGS: Study was performed with a portable apparatus. Image quality is reduced. Brain: There is a area of 27 x 16 mm hypoattenuation in the RIGHT occipital lobe, uncertain significance and duration. This was not present in 2009, but image quality is insufficient to distinguish between an acute versus chronic infarct. Hypoattenuation due to a mass is less likely. Poor gray-white differentiation throughout the cerebral hemispheres. Anoxic- ischemic injury is not excluded. No hemorrhage, hydrocephalus, or extra-axial fluid. Mild atrophy, premature for age. Vascular: No hyperdense vessel.  Carotid siphon calcification. Skull: Negative Sinuses/Orbits: Negative Other: None. IMPRESSION: Poor gray-white differentiation throughout the cerebral  hemispheres. Anoxic ischemic injury is not excluded. Superficial RIGHT occipital lobe area of hypoattenuation, approximately the 3 x 1.5 cm, could represent an acute or chronic infarct. Continued surveillance warranted. No intracranial hemorrhage or large vessel occlusion. Electronically Signed   By: Staci Righter M.D.   On: 03/15/2016 20:16    PHYSICAL EXAM CVP 3  General: Intubated/sedated.  Neck: No JVD, no thyromegaly or thyroid nodule.  Lungs: Coarse throughout. . CV: Lateral PMI.  Heart regular S1/S2, no S3/S4, no murmur.  No peripheral edema.   Abdomen: Soft, nontender, no hepatosplenomegaly, no distention.  Neurologic: Intubated, not responsive. Having myoclonic jerks.  Extremities: No clubbing or cyanosis. R and LLE SCDs.   TELEMETRY: Reviewed telemetry pt in a-paced, BiV paced  ASSESSMENT AND PLAN: 55 yo with history of systolic HF due to ischemic cardiomyopathy (EF 20-25%) S/P [employer] bi-V ICD implanted October 2016, PAFL, hypertension, diabetes mellitus, hyperlipidemia who presents with VT/VF arrest at home.  1. Cardiac arrest: VF arrest at home.  Coronary angiography this admission showed stable coronary anatomy compared to post-PCI in 4/17.  Suspect primary arrhythmic arrest. Rewarmed.  - Continue amiodarone gtt for now.  2. Chronic systolic CHF: EF 24-82%, ischemic cardiomyopathy.  Now on 18 mcg norepi. CVP 3.  Co-ox pending.   - Continue digoxin. Dose lowered with elevated level.  - Off diuretics. CVP ok. Creatinine coming dwon 1.6>1.4   3. CAD: Stable anatomy on angiography. Continue Plavix, statin.  4. Atrial flutter: s/p ablation.  Had been on warfarin.  Holding warfarin for now, will use bivalirudin when INR < 2 (no heparin/Lovenox with religious beliefs).  INR 1.57 oday.  5. Possible aspiration PNA: On Unasyn per CCM.  6. Neuro: He has been rewarmed and sedation is off.  No meaningful response. Has started to have post-anoxic myoclonus.  Prognosis for neurological  recovery seems to be poor.    Amy Clegg NP-C  03/22/2016 7:20 AM  Patient seen and examined with Darrick Grinder, NP. We discussed all aspects of the encounter. I agree with the assessment and plan as stated above.   Neurology and CCM nput reviewed and appreciated. Remains on pressors. Rhythm stable.  Appears to have have severe anoxic brain injury - EEG in progress. Volume status, renal function and CAD stable.   Prognosis appears extremely poor.   The patient is critically ill with multiple organ systems failure and requires high complexity decision making for assessment and support, frequent evaluation and titration of therapies, application of advanced monitoring technologies and extensive interpretation of multiple databases.   Critical Care Time devoted to patient care services described in this note is 35 Minutes.  Bensimhon, Daniel,MD 8:13 AM

## 2016-03-22 NOTE — Progress Notes (Signed)
ANTICOAGULATION CONSULT NOTE  Pharmacy Consult for  Angiomax Indication: atrial flutter  Allergies  Allergen Reactions  . Heparin Other (See Comments)    Will not use pork-derived products due to religious beliefs   . Lovenox [Enoxaparin Sodium] Other (See Comments)    Will not use pork-derived products due to religious beliefs   . Pork-Derived Products Other (See Comments)    Does not eat pork or pork derived products d/t religious reasons    Patient Measurements: Height: 5\' 7"  (170.2 cm) Weight: 229 lb 4.5 oz (104 kg) IBW/kg (Calculated) : 66.1  Vital Signs: Temp: 98.2 F (36.8 C) (08/21 0000) Temp Source: Core (Comment) (08/21 0000) BP: 100/70 (08/21 0115) Pulse Rate: 60 (08/21 0115)  Labs:  Recent Labs  03/18/2016 1015 03/28/2016 1345  03/25/2016 2003  03/20/16 0200 03/20/16 0435  03/20/16 2006 03/21/16 0005 03/21/16 0437 03/22/16 0417  HGB 13.2  --   < >  --   --   --   --   < > 15.6  --  14.2 12.9*  HCT 41.8  --   < >  --   --   --   --   < > 46.0  --  45.3 40.3  PLT 233  --   --   --   --   --   --   --   --   --  196 PENDING  APTT  --  34  --  37*  --   --   --   --   --   --   --  36  LABPROT 22.0*  --   < >  --   --   --  26.8*  --   --   --  32.5* 18.9*  INR 1.90  --   < >  --   --   --  2.43  --   --   --  3.08 1.57  CREATININE 1.55* 1.52*  < > 1.41*  < >  --  1.36*  < > 1.00 1.18 1.69*  --   TROPONINI 0.10* 0.35*  --  0.26*  --  0.21*  --   --   --   --   --   --   < > = values in this interval not displayed.  Estimated Creatinine Clearance: 57.5 mL/min (by C-G formula based on SCr of 1.69 mg/dL).  Assessment: 55 yo male s/p cardiac arrest/hypothermia protocol, h/o aflutter and Coumadin in hold, for Angiomax.  INR this morning 1.57.     Goal of Therapy:  APTT 50-90 INR 2-3 Monitor platelets by anticoagulation protocol: Yes   Plan:  Start Angiomax 0.15 mg/kg/hr APTT at 0800  Geannie Risen, PharmD, BCPS  03/22/2016 4:48 AM

## 2016-03-22 NOTE — Progress Notes (Signed)
PULMONARY / CRITICAL CARE MEDICINE   Name: Benjamin [name] MRN: [id number] DOB: July 17, 1961    ADMISSION DATE:  03/14/2016 CONSULTATION DATE:  03/12/2016  REFERRING MD:  Loren Racer, M.D. / EDP  CHIEF COMPLAINT:  Post Arrest  HISTORY OF PRESENT ILLNESS:  Per records patient was a witnessed arrest with CPR initiated by his son. On arrival patient was in PEA. Patient received approximately 12 minutes of CPR total as well as bolus epinephrine. Patient lost his pulse in the midst of transport to the emergency department but regained it prior to arrival. Rhythm was paced upon arrival in the emergency department. Endotracheal tube placed by EMS. Family confirm the patient did not have any purposeful movement or open his eyes at all during his resuscitation at home. Additionally his son noted that his AICD did fire as he was doing CPR. The wife found him laying on the bed, unresponsive, with abnormal respirations. She promptly called 911 & her son started CPR. PCCM was consulted for vent & medical management. Therapeutic hypothermia was initiated in the ED prior to going to the cath lab. The patient received 1L of iced saline. Patient did receive bolus bicarb during heart cath.  SUBJECTIVE:  Remains unresponsive off propofol.  VITAL SIGNS: BP 103/72   Pulse 60   Temp 99.3 F (37.4 C) (Core (Comment))   Resp (!) 24   Ht 5\' 7"  (1.702 m)   Wt 105.6 kg (232 lb 13.2 oz)   SpO2 100%   BMI 36.47 kg/m   HEMODYNAMICS: CVP:  [2 mmHg-11 mmHg] 2 mmHg  VENTILATOR SETTINGS: Vent Mode: PRVC FiO2 (%):  [40 %] 40 % Set Rate:  [24 bmp] 24 bmp Vt Set:  [530 mL] 530 mL PEEP:  [5 cmH20] 5 cmH20 Plateau Pressure:  [12 cmH20-18 cmH20] 18 cmH20  INTAKE / OUTPUT: I/O last 3 completed shifts: In: 3473.1 [I.V.:2458.1; NG/GT:230; IV Piggyback:785] Out: 1938 [Urine:1938]  PHYSICAL EXAMINATION: General:  Unresponsive on MV Integument:  Warm & dry. No rash on exposed skin. No bruising.  Lymphatics:  No  appreciated cervical or supraclavicular lymphadenoapthy. HEENT:  No scleral injection or icterus. Endotracheal tube in place.  Cardiovascular:  Regular rate. No edema. No appreciable JVD.  Pulmonary:  Good aeration & clear to auscultation bilaterally. Symmetric chest wall rise on ventilator. Abdomen: Soft. Normal bowel sounds. Nondistended.  Musculoskeletal:  Normal bulk. No joint deformity or effusion appreciated. Neurological:  No withdrawal to pain in extremities. Pupils 11mm and equal. Intermittent myoclonic jerking  LABS:  BMET  Recent Labs Lab 03/21/16 0005 03/21/16 0437 03/22/16 0417  NA 139 139 139  K 4.1 5.0 3.7  CL 109 108 108  CO2 21* 21* 21*  BUN 28* 30* 30*  CREATININE 1.18 1.69* 1.48*  GLUCOSE 140* 149* 133*    Electrolytes  Recent Labs Lab 03/18/2016 1345  03/20/16 0435 03/21/16 0005 03/21/16 0437 03/22/16 0417  CALCIUM 7.7*  < > 8.1* 8.2* 8.4* 8.1*  MG 1.8  --  1.9  --   --   --   PHOS 3.9  --  3.3  --   --   --   < > = values in this interval not displayed.  CBC  Recent Labs Lab 03/30/2016 1015  03/20/16 2006 03/21/16 0437 03/22/16 0417  WBC 12.3*  --   --  13.9* 11.2*  HGB 13.2  < > 15.6 14.2 12.9*  HCT 41.8  < > 46.0 45.3 40.3  PLT 233  --   --  196 184  < > = values in this interval not displayed.  Coag's  Recent Labs Lab 03/22/2016 1345  03/05/2016 2003 03/20/16 0435 03/21/16 0437 03/22/16 0417  APTT 34  --  37*  --   --  36  INR  --   < >  --  2.43 3.08 1.57  < > = values in this interval not displayed.  Sepsis Markers  Recent Labs Lab 03/14/2016 1400 03/02/2016 2003 03/20/16 0200  LATICACIDVEN 2.2* 2.0* 2.3*    ABG  Recent Labs Lab 03/06/2016 1107 03/28/2016 1129 03/18/2016 1608  PHART 7.339* 7.408 7.397  PCO2ART 36.5 36.5 30.4*  PO2ART 162.0* 237.0* 195.0*   Liver Enzymes  Recent Labs Lab 03/20/16 0435 03/21/16 0437 03/22/16 0417  AST 60* 44* 35  ALT 60 55 36  ALKPHOS 69 70 57  BILITOT 0.9 1.0 0.7  ALBUMIN 2.9*  3.0* 2.6*   Cardiac Enzymes  Recent Labs Lab 03/02/2016 1345 03/26/2016 2003 03/20/16 0200  TROPONINI 0.35* 0.26* 0.21*   Glucose  Recent Labs Lab 03/21/16 0818 03/21/16 1208 03/21/16 1623 03/21/16 2030 03/21/16 2357 03/22/16 0726  GLUCAP 136* 148* 180* 167* 130* 122*   Imaging Dg Chest Port 1 View  Result Date: 03/22/2016 CLINICAL DATA:  Acute respiratory failure EXAM: PORTABLE CHEST 1 VIEW COMPARISON:  Chest radiograph 03/21/2016 FINDINGS: Endotracheal tube tip is at the level of the clavicular heads. A right IJ central venous catheter overlies the lower SVC. Unchanged appearance of left chest wall pacemaker/AICD. Aeration of the lungs has improved. There is persistent pulmonary edema, decreased from the prior study. Small left pleural effusion. No pneumothorax. Persistent cardiomegaly. IMPRESSION: Mildly improved lung inflation with decreased pulmonary edema. Electronically Signed   By: Deatra RobinsonKevin  Herman M.D.   On: 03/22/2016 05:58   STUDIES:  PFT 12/12/15: FVC 3.06 L (68%) FEV1 2.11 L (61%) FEV1/FVC 0.69 positive bronchodilator response TLC 4.61 L (72%) RV 77% ERV 78% DLCO uncorrected 72% TTE 02/11/16: LV severely dilated. EF 15-20% w/ diffuse hypokinesis. Grade 3 diastolic dysfunction. RV mildly dilated with normal systolic function. Moderate mitral regurgitation. No aortic stenosis or regurgitation. Port CXR 8/18: Endotracheal tube in acceptable position. Suggestion of cardiomegaly. Left lower lobe opacity as well as right midlung opacity with air bronchograms suggesting consolidation. EEG 8/18 >> no over seizure activity, generalized low voltage and irregular delta activity Head CT scan 8/18 >> poor gray-white differentiation throughout hemispheres, R occipital hypo-attenuation could represent chronic or sub-acute infarct L heart cath 8/18 >> no significant change from prior study 11/2015  MICROBIOLOGY: MRSA PCR 8/18 >> Tracheal Asp Ctx 8/18 >> Blood Ctx x2 8/18  >>  ANTIBIOTICS: Unasyn 8/18 >>  SIGNIFICANT EVENTS: 8/18 - Admit post arrest w/ cooling initiated & LHC  LINES/TUBES: OETT 7.0 8/18 >> Foley 8/18>>> PIV x2>>>  DISCUSSION:  55 year old male with known DM, HTN, DLD, & Systolic CHF (EF 16-10%15-20%). VF arrest w/ 12 min until ROSC. Initiated therapeutic hypothermia. Neurological prognosis poor based on myoclonus and abnormal CT head  ASSESSMENT / PLAN:  CARDIOVASCULAR A:  S/P VF Arrest - 12 min downtime. Witnessed & CPR initiated by son. Shock - Likely due to medication/sedation +/- cardiogenic H/O NSTEMI/CAD H/O Systolic CHF H/O AICD Placement H/O HTN & DLD  P:  Vitals per unit protocol Continuous telemetry monitoring Trending Troponin I q6hr Amiodarone, digoxin  Systemic anticoagulation > plavix, start angiomax once INR < 2.0, (8/20 > 3.0) Levophed for BP support.  NEUROLOGIC A:   Sedation on Ventilator Acute  Encephalopathy - Likely from anoxia. No reported purposeful movements post arrest or currently off sedation Myoclonus, due to anoxic injury. Unlikely to be seizures P:   RASS goal: 0 33C Hypothermia Protocol completed Appreciate Neurology's assistance keppra + depakote ordered EEG noted Hold propofol for now unless patient seizes again then will restart.  PULMONARY A: Acute Hypoxic Respiratory Failure - Post arrest. Probable Aspiration - R Lung on CXR. Moderate COPD - Based on pre-bronchod spiro May 2017. Mild Restrictive Lung Disease - Based on lung vol May 2017. Likely due to cardiomyopathy.  P:   Full Vent Support Hold weaning until neuro status is addressed. Intermittent Port CXR & ABG  RENAL A:   Acute Renal Failure Lactic Acidosis, improved  P:   Trending UOP with Foley Monitoring electrolytes & renal function, serial BMP Replacing electrolytes as indicated  GASTROINTESTINAL A:   Transaminitis - Mild. Likely due to shock.  P:   NPO Pepcid IV q12hr Trending LFTs   HEMATOLOGIC A:    Coagulopathy on Systemic Anticoagulation - Coumadin as outpatient. INR 1.9 on arrival. Leukocytosis - Likely reactive from arrest.  P:  Trending cell counts w/ CBC  SCDs  No heparin or lovenox due to religious beliefs Coumadin d/c'd, plan is to start angiomax when INR < 2 Vit K 5mg  x 1 now  INFECTIOUS A:   Probable Aspiration Pneumonia vs Pneumonitis  P:   Empiric Unasyn Day #4 Tracheal Asp Ctx  ENDOCRINE A:   H/O DM Type 2    P:   Checking Hgb A1c SSI per Moderate algorithm Accu-Checks q4hr Holding home Lantus & Metformin  FAMILY  - Updates: Spoke with family at length, after discussion, decided to make patient DNR and will allow for another day to see neuro status, if no improvement then will likely withdraw.  - Inter-disciplinary family meet or Palliative Care meeting due by:  8/25  The patient is critically ill with multiple organ systems failure and requires high complexity decision making for assessment and support, frequent evaluation and titration of therapies, application of advanced monitoring technologies and extensive interpretation of multiple databases.   Critical Care Time devoted to patient care services described in this note is  45  Minutes. This time reflects time of care of this signee Dr Koren Bound. This critical care time does not reflect procedure time, or teaching time or supervisory time of PA/NP/Med student/Med Resident etc but could involve care discussion time.  Alyson Reedy, M.D. [facility] Pulmonary/Critical Care Medicine. Pager: [phone]. After hours pager: [phone].

## 2016-03-22 NOTE — Procedures (Addendum)
Continuous Video-EEG Monitoring Report  Patient: Benjamin [name], Benjamin [name]     EEG No.ID: [id number]     DOB: 06/02/1961 Age: 55 Room#2H08  MED REC NO: [id number] Gender: Male   TECH: Marianne Sofia     Physician: Ronnie Doss     Referring Physician: Ritta Slot     Report Date: 03/22/2016       Study Duration: 03/21/2016 10:20 to 03/22/2016 07:30 CPT Code:  25366 Diagnosis:  Altered mental status   History: This is a 55 year old male presenting with altered mental status after an episode of cardiac arrest.  Video-EEG monitoring was performed to evaluate for seizures.  Technical Details:  Long-term video-EEG monitoring was performed using standard setting per the guidelines.  Briefly, a minimum of 21 electrodes were placed on scalp according to the International 10-20 or/and 10-10 Systems.  Supplemental electrodes were placed as needed.  Single EKG electrode was also used to detect cardiac arrhythmia.  Patient's behavior was continuously recorded on video simultaneously with EEG.  A minimum of 16 channels were used for data display.  Each epoch of study was reviewed manually daily and as needed using standard referential and bipolar montages.    EEG Description:  Initially, the background was suppressed with no posterior dominant rhythm or sleep architecture.  GPEDs (generalized periodic epileptiform discharges) were present, at interval of 1-2/second.  After around 11: 52 on 03/21/2016, the GPEDs resolved, and intermittent generalized delta and theta slowing admixed with diffuse beta activity was seen, most likely due to uptitration of propofol.  No seizure was present.  Impression:  This is an abnormal EEG due to the presence of the following: 1) Background suppression and slowing, suggesting severe encephalopathy, but medication effect (propofol) could not be excluded; 2) GPEDs which subsided around 11:52 on 03/21/2016.  No seizure was in evidence.   Reading Physician: Ronnie Doss, MD, PhD

## 2016-03-22 NOTE — Progress Notes (Signed)
ANTICOAGULATION CONSULT NOTE - Follow Up Consult ANTIBIOTIC CONSULT NOTE - Follow Up Consult  Pharmacy Consult for Bivalirudin and Unasyn Indication: aflutter and aspiration pneumonia  Allergies  Allergen Reactions  . Heparin Other (See Comments)    Will not use pork-derived products due to religious beliefs   . Lovenox [Enoxaparin Sodium] Other (See Comments)    Will not use pork-derived products due to religious beliefs   . Pork-Derived Products Other (See Comments)    Does not eat pork or pork derived products d/t religious reasons    Patient Measurements: Height: 5\' 7"  (170.2 cm) Weight: 232 lb 13.2 oz (105.6 kg) IBW/kg (Calculated) : 66.1  Vital Signs: Temp: 98.4 F (36.9 C) (08/21 2000) Temp Source: Core (Comment) (08/21 2000) BP: 111/80 (08/21 2000) Pulse Rate: 59 (08/21 2000)  Labs:  Recent Labs  03/20/16 0200 03/20/16 0435  03/20/16 2006 03/21/16 0005 03/21/16 0437  03/22/16 0417 03/22/16 0925 03/22/16 1315 03/22/16 1800  HGB  --   --   < > 15.6  --  14.2  --  12.9*  --   --   --   HCT  --   --   < > 46.0  --  45.3  --  40.3  --   --   --   PLT  --   --   --   --   --  196  --  184  --   --   --   APTT  --   --   --   --   --   --   < > 36 80* 91* 98*  LABPROT  --  26.8*  --   --   --  32.5*  --  18.9*  --   --   --   INR  --  2.43  --   --   --  3.08  --  1.57  --   --   --   CREATININE  --  1.36*  < > 1.00 1.18 1.69*  --  1.48*  --   --   --   TROPONINI 0.21*  --   --   --   --   --   --   --   --   --   --   < > = values in this interval not displayed.  Estimated Creatinine Clearance: 66.1 mL/min (by C-G formula based on SCr of 1.48 mg/dL).   Medications:  Bivalirudin @ 0.15mg /kg/hr  Assessment: 55yom on coumadin pta for aflutter, admitted s/p cardiac arrest. He has now completed the hypothermia protocol. Coumadin held and pharmacy asked to start bivalirudin bridge (no heparin/lovenox d/t religious beliefs).   aPTT remains elevated at 98 after  rate decreased to 0.13 mg/kg/hr this morning.  Goal of Therapy:  aPTT 50-90 seconds Monitor platelets by anticoagulation protocol: Yes   Plan:  1) Decrease bivalirudin to 0.1mg /kg/hr 2) aPTT in 4 hours at midnight  Bayard Hugger, PharmD, BCPS  Clinical Pharmacist  Pager: [phone]  03/22/2016,8:19 PM

## 2016-03-22 NOTE — Progress Notes (Signed)
MD Molli Knock stopped patient's propofol to assess patient's status and family discussion occurred.  Spoke with MD Hilda Blades to inform of same.  MD Hilda Blades advised to utilize propofol and titrate as needed for seizure control.  New orders received.

## 2016-03-22 NOTE — Progress Notes (Signed)
ANTICOAGULATION CONSULT NOTE - Follow Up Consult ANTIBIOTIC CONSULT NOTE - Follow Up Consult  Pharmacy Consult for Bivalirudin and Unasyn Indication: aflutter and aspiration pneumonia  Allergies  Allergen Reactions  . Heparin Other (See Comments)    Will not use pork-derived products due to religious beliefs   . Lovenox [Enoxaparin Sodium] Other (See Comments)    Will not use pork-derived products due to religious beliefs   . Pork-Derived Products Other (See Comments)    Does not eat pork or pork derived products d/t religious reasons    Patient Measurements: Height: 5\' 7"  (170.2 cm) Weight: 232 lb 13.2 oz (105.6 kg) IBW/kg (Calculated) : 66.1  Vital Signs: Temp: 98.4 F (36.9 C) (08/21 1300) Temp Source: Core (Comment) (08/21 1300) BP: 115/76 (08/21 1330) Pulse Rate: 60 (08/21 1330)  Labs:  Recent Labs  03/05/2016 2003  03/20/16 0200 03/20/16 0435  03/20/16 2006 03/21/16 0005 03/21/16 0437 03/22/16 0417 03/22/16 0925 03/22/16 1315  HGB  --   --   --   --   < > 15.6  --  14.2 12.9*  --   --   HCT  --   --   --   --   < > 46.0  --  45.3 40.3  --   --   PLT  --   --   --   --   --   --   --  196 184  --   --   APTT 37*  --   --   --   --   --   --   --  36 80* 91*  LABPROT  --   --   --  26.8*  --   --   --  32.5* 18.9*  --   --   INR  --   --   --  2.43  --   --   --  3.08 1.57  --   --   CREATININE 1.41*  < >  --  1.36*  < > 1.00 1.18 1.69* 1.48*  --   --   TROPONINI 0.26*  --  0.21*  --   --   --   --   --   --   --   --   < > = values in this interval not displayed.  Estimated Creatinine Clearance: 66.1 mL/min (by C-G formula based on SCr of 1.48 mg/dL).   Medications:  Bivalirudin @ 0.15mg /kg/hr  Assessment: 55yom on coumadin pta for aflutter, admitted s/p cardiac arrest. He has now completed the hypothermia protocol. Coumadin held and pharmacy asked to start bivalirudin bridge (no heparin/lovenox d/t religious beliefs). INR 3.08 yesterday and he received IV  vitamin k 5mg . INR down to 1.57 this morning and bivalirudin started. Initial aPTT therapeutic at 80 but repeat 4 hours later trended up slightly above goal at 91. Hgb 14.2 > 12.9, plts stable. No bleeding.  He also continue on day #4 unasyn for aspiration pneumonia. SCr elevated but trending down 1.69 > 1.48, CrCl 37ml/min. Dose remains appropriate.   8/18 Unasyn >>  8/18 blood x2 >> ngtd 8/18 MRSA PCR >> NEG  Goal of Therapy:  aPTT 50-90 seconds Monitor platelets by anticoagulation protocol: Yes   Plan:  1) Decrease bivalirudin to 0.13mg /kg/hr 2) aPTT in 4 hours 3) Continue unasyn 3g IV q8  Fredrik Rigger 03/22/2016,1:53 PM

## 2016-03-23 ENCOUNTER — Encounter (HOSPITAL_COMMUNITY): Payer: Self-pay

## 2016-03-23 ENCOUNTER — Inpatient Hospital Stay (HOSPITAL_COMMUNITY): Payer: Medicaid Other

## 2016-03-23 DIAGNOSIS — R570 Cardiogenic shock: Secondary | ICD-10-CM

## 2016-03-23 LAB — GLUCOSE, CAPILLARY
GLUCOSE-CAPILLARY: 110 mg/dL — AB (ref 65–99)
GLUCOSE-CAPILLARY: 123 mg/dL — AB (ref 65–99)
GLUCOSE-CAPILLARY: 128 mg/dL — AB (ref 65–99)
GLUCOSE-CAPILLARY: 113 mg/dL — AB (ref 65–99)
GLUCOSE-CAPILLARY: 109 mg/dL — AB (ref 65–99)
Glucose-Capillary: 136 mg/dL — ABNORMAL HIGH (ref 65–99)

## 2016-03-23 LAB — HEPATIC FUNCTION PANEL
ALBUMIN: 2.5 g/dL — AB (ref 3.5–5.0)
ALT: 31 U/L (ref 17–63)
AST: 35 U/L (ref 15–41)
Alkaline Phosphatase: 56 U/L (ref 38–126)
Bilirubin, Direct: 0.3 mg/dL (ref 0.1–0.5)
Indirect Bilirubin: 0.4 mg/dL (ref 0.3–0.9)
Total Bilirubin: 0.7 mg/dL (ref 0.3–1.2)
Total Protein: 5.6 g/dL — ABNORMAL LOW (ref 6.5–8.1)

## 2016-03-23 LAB — BLOOD GAS, ARTERIAL
Acid-base deficit: 2.8 mmol/L — ABNORMAL HIGH (ref 0.0–2.0)
BICARBONATE: 20.2 meq/L (ref 20.0–24.0)
DRAWN BY: 100061
FIO2: 0.4
O2 SAT: 98.5 %
PEEP: 5 cmH2O
PH ART: 7.474 — AB (ref 7.350–7.450)
Patient temperature: 98.6
RATE: 24 resp/min
TCO2: 21.1 mmol/L (ref 0–100)
VT: 530 mL
pCO2 arterial: 27.9 mmHg — ABNORMAL LOW (ref 35.0–45.0)
pO2, Arterial: 136 mmHg — ABNORMAL HIGH (ref 80.0–100.0)

## 2016-03-23 LAB — APTT
APTT: 77 s — AB (ref 24–36)
APTT: 107 s — AB (ref 24–36)

## 2016-03-23 LAB — BASIC METABOLIC PANEL
ANION GAP: 9 (ref 5–15)
BUN: 20 mg/dL (ref 6–20)
CO2: 22 mmol/L (ref 22–32)
Calcium: 8.2 mg/dL — ABNORMAL LOW (ref 8.9–10.3)
Chloride: 109 mmol/L (ref 101–111)
Creatinine, Ser: 1.21 mg/dL (ref 0.61–1.24)
GLUCOSE: 125 mg/dL — AB (ref 65–99)
POTASSIUM: 3.6 mmol/L (ref 3.5–5.1)
Sodium: 140 mmol/L (ref 135–145)

## 2016-03-23 LAB — MAGNESIUM: Magnesium: 1.7 mg/dL (ref 1.7–2.4)

## 2016-03-23 LAB — CBC
HEMATOCRIT: 39.1 % (ref 39.0–52.0)
Hemoglobin: 12.4 g/dL — ABNORMAL LOW (ref 13.0–17.0)
MCH: 22.3 pg — ABNORMAL LOW (ref 26.0–34.0)
MCHC: 31.7 g/dL (ref 30.0–36.0)
MCV: 70.2 fL — AB (ref 78.0–100.0)
PLATELETS: 158 10*3/uL (ref 150–400)
RBC: 5.57 MIL/uL (ref 4.22–5.81)
RDW: 21.4 % — ABNORMAL HIGH (ref 11.5–15.5)
WBC: 8.7 10*3/uL (ref 4.0–10.5)

## 2016-03-23 LAB — PHOSPHORUS: PHOSPHORUS: 3.6 mg/dL (ref 2.5–4.6)

## 2016-03-23 LAB — PROTIME-INR
INR: 2.27
PROTHROMBIN TIME: 25.4 s — AB (ref 11.4–15.2)

## 2016-03-23 MED ORDER — MAGNESIUM SULFATE 50 % IJ SOLN
4.0000 g | Freq: Once | INTRAMUSCULAR | Status: DC
  Filled 2016-03-23: qty 8

## 2016-03-23 MED ORDER — POTASSIUM CHLORIDE 20 MEQ/15ML (10%) PO SOLN
40.0000 meq | Freq: Three times a day (TID) | ORAL | Status: AC
  Administered 2016-03-23 (×2): 40 meq
  Filled 2016-03-23 (×2): qty 30

## 2016-03-23 MED ORDER — MAGNESIUM SULFATE 4 GM/100ML IV SOLN
4.0000 g | Freq: Once | INTRAVENOUS | Status: AC
Start: 2016-03-23 — End: 2016-03-23
  Administered 2016-03-23: 4 g via INTRAVENOUS
  Filled 2016-03-23: qty 100

## 2016-03-23 MED FILL — Verapamil HCl IV Soln 2.5 MG/ML: INTRAVENOUS | Qty: 2 | Status: AC

## 2016-03-23 NOTE — Procedures (Addendum)
Continuous Video-EEG Monitoring Report   Patient: Benjamin [name], Benjamin [name]        EEG No.ID: [id number]        DOB: 1961/06/04 Age: 55 Room#2H08  MED REC NO: [id number] Gender: Male    TECH: Marianne Sofia        Physician: Ronnie Doss        Referring Physician: Ritta Slot        Report Date: 03/23/2016           Study Duration:                03/22/2016 07:30 to 03/23/2016 07:30 CPT Code:                         86578 Diagnosis:                         Altered mental status     History: This is a 55 year old male presenting with altered mental status after an episode of cardiac arrest.  Video-EEG monitoring was performed to evaluate for seizures.   Technical Details:  Long-term video-EEG monitoring was performed using standard setting per the guidelines.  Briefly, a minimum of 21 electrodes were placed on scalp according to the International 10-20 or/and 10-10 Systems.  Supplemental electrodes were placed as needed.  Single EKG electrode was also used to detect cardiac arrhythmia.  Patient's behavior was continuously recorded on video simultaneously with EEG.  A minimum of 16 channels were used for data display.  Each epoch of study was reviewed manually daily and as needed using standard referential and bipolar montages.     EEG Description:  The background was discontinuous, consisting of periods of suppression and periods of generalized delta and theta slowing.  Diffuse beta activity was seen, most likely due to uptitration of propofol.  After around 09:45 on 03/22/2016, GPEDs (generalized periodic epileptiform discharges) re-emerged.  At times, the GPEDs became more rhythmic upon external stimulation (>3 Hz and > 1hour), consistent with non-convulsive generalized status epilepticus.     Impression:  This is an abnormal EEG due to the presence of the following: 1) Background discontinuity, suggesting severe encephalopathy, but medication effect (propofol) could not be excluded; 2) GPEDs which evolved  intermittently into non-convulsive generalized status epilepticus.     Reading Physician: Ronnie Doss, MD, PhD

## 2016-03-23 NOTE — Progress Notes (Signed)
ANTICOAGULATION CONSULT NOTE - Follow Up Consult  Pharmacy Consult for Bivalirudin Indication: aflutter   Allergies  Allergen Reactions  . Heparin Other (See Comments)    Will not use pork-derived products due to religious beliefs   . Lovenox [Enoxaparin Sodium] Other (See Comments)    Will not use pork-derived products due to religious beliefs   . Pork-Derived Products Other (See Comments)    Does not eat pork or pork derived products d/t religious reasons    Patient Measurements: Height: 5\' 7"  (170.2 cm) Weight: 232 lb 13.2 oz (105.6 kg) IBW/kg (Calculated) : 66.1  Vital Signs: Temp: 98.6 F (37 C) (08/22 0800) Temp Source: Core (Comment) (08/22 0800) BP: 112/74 (08/22 0900) Pulse Rate: 60 (08/22 0900)  Labs:  Recent Labs  03/21/16 0437 03/22/16 0417  03/22/16 1800 03/22/16 2340 03/23/16 0405  HGB 14.2 12.9*  --   --   --  12.4*  HCT 45.3 40.3  --   --   --  39.1  PLT 196 184  --   --   --  158  APTT  --  36  < > 98* 107* 77*  LABPROT 32.5* 18.9*  --   --   --  25.4*  INR 3.08 1.57  --   --   --  2.27  CREATININE 1.69* 1.48*  --   --   --  1.21  < > = values in this interval not displayed.  Estimated Creatinine Clearance: 80.8 mL/min (by C-G formula based on SCr of 1.21 mg/dL).   Medications:  Bivalirudin @ 0.05mg /kg/hr  Assessment: 55yom on coumadin pta for aflutter, admitted s/p cardiac arrest. He has now completed the hypothermia protocol. Coumadin held and bivalirudin bridge started yesterday. APTT is therapeutic at 77. Hgb stable, platelets slowly trending down. No bleeding.  Goal of Therapy:  aPTT 50-90 seconds Monitor platelets by anticoagulation protocol: Yes   Plan:  1) Continue bivalirudin @ 0.05mg /kg/hr 2) Daily APTT and CBC  Fredrik Rigger 03/23/2016,9:33 AM

## 2016-03-23 NOTE — Progress Notes (Signed)
PULMONARY / CRITICAL CARE MEDICINE   Name: Benjamin [name] MRN: [id number] DOB: [birth year]    ADMISSION DATE:  2016-04-04 CONSULTATION DATE:  2016/04/04  REFERRING MD:  Loren Racer, M.D. / EDP  CHIEF COMPLAINT:  Post Arrest  HISTORY OF PRESENT ILLNESS:  Per records patient was a witnessed arrest with CPR initiated by his son. On arrival patient was in PEA. Patient received approximately 12 minutes of CPR total as well as bolus epinephrine. Patient lost his pulse in the midst of transport to the emergency department but regained it prior to arrival. Rhythm was paced upon arrival in the emergency department. Endotracheal tube placed by EMS. Family confirm the patient did not have any purposeful movement or open his eyes at all during his resuscitation at home. Additionally his son noted that his AICD did fire as he was doing CPR. The wife found him laying on the bed, unresponsive, with abnormal respirations. She promptly called 911 & her son started CPR. PCCM was consulted for vent & medical management. Therapeutic hypothermia was initiated in the ED prior to going to the cath lab. The patient received 1L of iced saline. Patient did receive bolus bicarb during heart cath.  SUBJECTIVE:  Remains unresponsive off propofol.  VITAL SIGNS: BP 112/74 (BP Location: Left Wrist)   Pulse 60   Temp 98.6 F (37 C) (Core (Comment))   Resp 17   Ht 5\' 7"  (1.702 m)   Wt 105.6 kg (232 lb 13.2 oz)   SpO2 100%   BMI 36.47 kg/m   HEMODYNAMICS: CVP:  [1 mmHg-4 mmHg] 3 mmHg  VENTILATOR SETTINGS: Vent Mode: PRVC FiO2 (%):  [40 %] 40 % Set Rate:  [16 bmp-24 bmp] 16 bmp Vt Set:  [530 mL] 530 mL PEEP:  [5 cmH20] 5 cmH20 Plateau Pressure:  [16 cmH20-23 cmH20] 16 cmH20  INTAKE / OUTPUT: I/O last 3 completed shifts: In: 4160.3 [I.V.:3025.3; NG/GT:190; IV Piggyback:945] Out: 2980 [Urine:2980]  PHYSICAL EXAMINATION: General:  Unresponsive on MV Integument:  Warm & dry. No rash on exposed skin. No  bruising.  Lymphatics:  No appreciated cervical or supraclavicular lymphadenoapthy. HEENT:  No scleral injection or icterus. Endotracheal tube in place.  Cardiovascular:  Regular rate. No edema. No appreciable JVD.  Pulmonary:  Good aeration & clear to auscultation bilaterally. Symmetric chest wall rise on ventilator. Abdomen: Soft. Normal bowel sounds. Nondistended.  Musculoskeletal:  Normal bulk. No joint deformity or effusion appreciated. Neurological:  No withdrawal to pain in extremities. Pupils 98mm and equal. Intermittent myoclonic jerking  LABS:  BMET  Recent Labs Lab 03/21/16 0437 03/22/16 0417 03/23/16 0405  NA 139 139 140  K 5.0 3.7 3.6  CL 108 108 109  CO2 21* 21* 22  BUN 30* 30* 20  CREATININE 1.69* 1.48* 1.21  GLUCOSE 149* 133* 125*   Electrolytes  Recent Labs Lab 04/04/2016 1345  03/20/16 0435  03/21/16 0437 03/22/16 0417 03/23/16 0405  CALCIUM 7.7*  < > 8.1*  < > 8.4* 8.1* 8.2*  MG 1.8  --  1.9  --   --   --  1.7  PHOS 3.9  --  3.3  --   --   --  3.6  < > = values in this interval not displayed.  CBC  Recent Labs Lab 03/21/16 0437 03/22/16 0417 03/23/16 0405  WBC 13.9* 11.2* 8.7  HGB 14.2 12.9* 12.4*  HCT 45.3 40.3 39.1  PLT 196 184 158   Coag's  Recent Labs Lab 03/21/16 0437 03/22/16 0417  03/22/16 1800 03/22/16 2340 03/23/16 0405  APTT  --  36  < > 98* 107* 77*  INR 3.08 1.57  --   --   --  2.27  < > = values in this interval not displayed.  Sepsis Markers  Recent Labs Lab 03-Dec-2015 1400 03-Dec-2015 2003 03/20/16 0200  LATICACIDVEN 2.2* 2.0* 2.3*   ABG  Recent Labs Lab 03-Dec-2015 1129 03-Dec-2015 1608 03/23/16 0436  PHART 7.408 7.397 7.474*  PCO2ART 36.5 30.4* 27.9*  PO2ART 237.0* 195.0* 136*   Liver Enzymes  Recent Labs Lab 03/21/16 0437 03/22/16 0417 03/23/16 0405  AST 44* 35 35  ALT 55 36 31  ALKPHOS 70 57 56  BILITOT 1.0 0.7 0.7  ALBUMIN 3.0* 2.6* 2.5*   Cardiac Enzymes  Recent Labs Lab 03-Dec-2015 1345  03-Dec-2015 2003 03/20/16 0200  TROPONINI 0.35* 0.26* 0.21*   Glucose  Recent Labs Lab 03/22/16 1541 03/22/16 1956 03/22/16 2339 03/22/16 2343 03/23/16 0310 03/23/16 0742  GLUCAP 129* 106* 267* 99 110* 123*   Imaging Dg Chest Port 1 View  Result Date: 03/23/2016 CLINICAL DATA:  Shortness of breath. EXAM: PORTABLE CHEST 1 VIEW COMPARISON:  03/22/2016. FINDINGS: Endotracheal tube, NG tube, right IJ line stable position. AICD in stable position. Cardiomegaly with pulmonary vascular prominence and bilateral interstitial prominence bilateral pleural effusions. Findings consistent with congestive heart failure no pneumothorax. IMPRESSION: 1.  Lines and tubes in stable position. 2. AICD in stable position. Cardiomegaly with pulmonary vascular prominence and bilateral interstitial prominence bilateral pleural effusions consistent congestive heart failure with pulmonary interstitial edema. Similar findings noted on prior exam Electronically Signed   By: Maisie Fushomas  Register   On: 03/23/2016 07:00   STUDIES:  PFT 12/12/15: FVC 3.06 L (68%) FEV1 2.11 L (61%) FEV1/FVC 0.69 positive bronchodilator response TLC 4.61 L (72%) RV 77% ERV 78% DLCO uncorrected 72% TTE 02/11/16: LV severely dilated. EF 15-20% w/ diffuse hypokinesis. Grade 3 diastolic dysfunction. RV mildly dilated with normal systolic function. Moderate mitral regurgitation. No aortic stenosis or regurgitation. Port CXR 8/18: Endotracheal tube in acceptable position. Suggestion of cardiomegaly. Left lower lobe opacity as well as right midlung opacity with air bronchograms suggesting consolidation. EEG 8/18 >> no over seizure activity, generalized low voltage and irregular delta activity Head CT scan 8/18 >> poor gray-white differentiation throughout hemispheres, R occipital hypo-attenuation could represent chronic or sub-acute infarct L heart cath 8/18 >> no significant change from prior study 11/2015  MICROBIOLOGY: MRSA PCR 8/18 >> Tracheal Asp  Ctx 8/18 >> Blood Ctx x2 8/18 >>  ANTIBIOTICS: Unasyn 8/18 >>  SIGNIFICANT EVENTS: 8/18 - Admit post arrest w/ cooling initiated & LHC  LINES/TUBES: OETT 7.0 8/18 >> Foley 8/18>>> PIV x2>>>  DISCUSSION:  [age] year old male with known DM, HTN, DLD, & Systolic CHF (EF 78-29%15-20%). VF arrest w/ 12 min until ROSC. Initiated therapeutic hypothermia. Neurological prognosis poor based on myoclonus and abnormal CT head  ASSESSMENT / PLAN:  CARDIOVASCULAR A:  S/P VF Arrest - 12 min downtime. Witnessed & CPR initiated by son. Shock - Likely due to medication/sedation +/- cardiogenic H/O NSTEMI/CAD H/O Systolic CHF H/O AICD Placement H/O HTN & DLD  P:  Vitals per unit protocol. Continuous telemetry monitoring. Trending Troponin I q6hr. Amiodarone, digoxin. Systemic anticoagulation > plavix, start angiomax once INR < 2.0, (8/20 > 3.0). Levophed for BP support specially that we will be increasing propofol.  NEUROLOGIC A:   Sedation on Ventilator Acute Encephalopathy - Likely from anoxia. No reported purposeful movements post arrest  or currently off sedation Myoclonus, due to anoxic injury. Unlikely to be seizures P:   RASS goal: 0. 33C Hypothermia Protocol completed. Appreciate Neurology's assistance. keppra + depakote ordered. EEG noted. Increase propofol for seizure control.  PULMONARY A: Acute Hypoxic Respiratory Failure - Post arrest. Probable Aspiration - R Lung on CXR. Moderate COPD - Based on pre-bronchod spiro May 2017. Mild Restrictive Lung Disease - Based on lung vol May 2017. Likely due to cardiomyopathy.  P:   Full Vent Support. Decrease RR to 16 for respiratory acidosis. Hold weaning until neuro status is addressed. Intermittent Port CXR & ABG.  RENAL A:   Acute Renal Failure Lactic Acidosis, improved  P:   Trending UOP with Foley Monitoring electrolytes & renal function, serial BMP Replacing electrolytes as indicated  GASTROINTESTINAL A:    Transaminitis - Mild. Likely due to shock.  P:   If continue support then will need TF. Pepcid IV q12hr. Trending LFTs.  HEMATOLOGIC A:   Coagulopathy on Systemic Anticoagulation - Coumadin as outpatient. INR 1.9 on arrival. Leukocytosis - Likely reactive from arrest.  P:  Trending cell counts w/ CBC. SCDs. Anticoagulation per cardiology.  INFECTIOUS A:   Probable Aspiration Pneumonia vs Pneumonitis  P:   Empiric Unasyn Day #5 Tracheal Asp Ctx  ENDOCRINE A:   H/O DM Type 2    P:   Checking Hgb A1c SSI per Moderate algorithm Accu-Checks q4hr Holding home Lantus & Metformin  FAMILY  - Updates: Spoke with wife, awaiting arrival of the rest of the family to decide on plan of care.  - Inter-disciplinary family meet or Palliative Care meeting due by:  8/25  The patient is critically ill with multiple organ systems failure and requires high complexity decision making for assessment and support, frequent evaluation and titration of therapies, application of advanced monitoring technologies and extensive interpretation of multiple databases.   Critical Care Time devoted to patient care services described in this note is  45  Minutes. This time reflects time of care of this signee Dr Koren Bound. This critical care time does not reflect procedure time, or teaching time or supervisory time of PA/NP/Med student/Med Resident etc but could involve care discussion time.  Alyson Reedy, M.D. [facility] Pulmonary/Critical Care Medicine. Pager: [phone]. After hours pager: [phone].

## 2016-03-23 NOTE — Progress Notes (Signed)
ANTICOAGULATION CONSULT NOTE  Pharmacy Consult for  Angiomax Indication: atrial flutter  Allergies  Allergen Reactions  . Heparin Other (See Comments)    Will not use pork-derived products due to religious beliefs   . Lovenox [Enoxaparin Sodium] Other (See Comments)    Will not use pork-derived products due to religious beliefs   . Pork-Derived Products Other (See Comments)    Does not eat pork or pork derived products d/t religious reasons    Patient Measurements: Height: 5\' 7"  (170.2 cm) Weight: 232 lb 13.2 oz (105.6 kg) IBW/kg (Calculated) : 66.1  Vital Signs: Temp: 99 F (37.2 C) (08/22 0000) Temp Source: Core (Comment) (08/22 0000) BP: 110/74 (08/22 0000) Pulse Rate: 60 (08/22 0000)  Labs:  Recent Labs  03/20/16 0200 03/20/16 0435  03/20/16 2006 03/21/16 0005 03/21/16 0437 03/22/16 0417  03/22/16 1315 03/22/16 1800 03/22/16 2340  HGB  --   --   < > 15.6  --  14.2 12.9*  --   --   --   --   HCT  --   --   < > 46.0  --  45.3 40.3  --   --   --   --   PLT  --   --   --   --   --  196 184  --   --   --   --   APTT  --   --   --   --   --   --  36  < > 91* 98* 107*  LABPROT  --  26.8*  --   --   --  32.5* 18.9*  --   --   --   --   INR  --  2.43  --   --   --  3.08 1.57  --   --   --   --   CREATININE  --  1.36*  < > 1.00 1.18 1.69* 1.48*  --   --   --   --   TROPONINI 0.21*  --   --   --   --   --   --   --   --   --   --   < > = values in this interval not displayed.  Estimated Creatinine Clearance: 66.1 mL/min (by C-G formula based on SCr of 1.48 mg/dL).  Assessment: 55 yo male s/p cardiac arrest/hypothermia protocol, h/o aflutter and Coumadin in hold, for Angiomax. PTT increasing despite rate decreases.  Goal of Therapy:  APTT 50-90 Monitor platelets by anticoagulation protocol: Yes   Plan:  Decrease Angiomax 0.05 mg/kg/hr Follow-up am labs.   Geannie Risen, PharmD, BCPS  03/23/2016 12:59 AM

## 2016-03-23 NOTE — Progress Notes (Signed)
Patient ID: Benjamin [name], male   DOB: 12-18-1960, 55 y.o.   MRN: [id number]   ADVANCED HF PROGRESS NOTE  Had been doing well just returned from trip to [place] with his wife. Was in bed 8/18 am and wife went to bathroom. She hear him making a strange sound so came out of bathroom and found him unresponsive on the bed. She called her son who was downstairs. He called 911 and immediately stadted CPR. While doing CPR he felt ICD shock. Intubated in field and brought to ER. In ER, unresponsive but had stable BP and HR. Patient did not complain of CP prior to arrest.  ICD interrogated showed VF that was just below the 200 beat threshold for a while (poor R wave detection) and finally received shock back to NSR. In ER decision made to take emergently to cath lab for coronary angiogram and beginning cooling process. Coronary angiography was stable compared to post-PCI in 4/17, suspect primary arrhythmic event.   Over the weekend had Myoclonus is on Keppra now.  No meaningful response.  CT possibly suggestive of anoxic injury.   Yesterday norepi weaned to 5 mcg. EEG abnormal.     Scheduled Meds: . ampicillin-sulbactam (UNASYN) IV  3 g Intravenous Q8H  . antiseptic oral rinse  7 mL Mouth Rinse 10 times per day  . artificial tears  1 application Both Eyes I2L  . chlorhexidine gluconate (SAGE KIT)  15 mL Mouth Rinse BID  . clopidogrel  75 mg Per Tube Daily  . digoxin  0.0625 mg Oral Daily  . famotidine (PEPCID) IV  20 mg Intravenous Q12H  . insulin aspart  0-15 Units Subcutaneous Q4H  . insulin glargine  10 Units Subcutaneous Daily  . levETIRAcetam  1,000 mg Intravenous BID  . rosuvastatin  20 mg Per Tube q1800  . sodium chloride flush  10-40 mL Intracatheter Q12H  . sodium chloride flush  3 mL Intravenous Q12H  . valproate sodium  500 mg Intravenous Q8H   Continuous Infusions: . amiodarone 30 mg/hr (03/22/16 1900)  . bivalirudin (ANGIOMAX) infusion 0.5 mg/mL (Non-ACS indications) 0.05 mg/kg/hr  (03/23/16 0124)  . dextrose    . norepinephrine (LEVOPHED) Adult infusion 5 mcg/min (03/23/16 0550)  . propofol (DIPRIVAN) infusion 25 mcg/kg/min (03/23/16 0648)   PRN Meds:.sodium chloride, acetaminophen, dextrose, fentaNYL (SUBLIMAZE) injection, hydrALAZINE, labetalol, midazolam, ondansetron (ZOFRAN) IV, sodium chloride flush, sodium chloride flush     Vitals:   03/23/16 0600 03/23/16 0630 03/23/16 0645 03/23/16 0700  BP: 116/74 99/67 106/69 115/73  Pulse: 60 60 60 60  Resp: (!) '24 16 16 16  '$ Temp:      TempSrc:      SpO2: 100% 100% 100% 100%  Weight:      Height:        Intake/Output Summary (Last 24 hours) at 03/23/16 0728 Last data filed at 03/23/16 0700  Gross per 24 hour  Intake          2961.33 ml  Output             1720 ml  Net          1241.33 ml    LABS: Basic Metabolic Panel:  Recent Labs  03/22/16 0417 03/23/16 0405  NA 139 140  K 3.7 3.6  CL 108 109  CO2 21* 22  GLUCOSE 133* 125*  BUN 30* 20  CREATININE 1.48* 1.21  CALCIUM 8.1* 8.2*  MG  --  1.7  PHOS  --  3.6  Liver Function Tests:  Recent Labs  03/22/16 0417 03/23/16 0405  AST 35 35  ALT 36 31  ALKPHOS 57 56  BILITOT 0.7 0.7  PROT 5.7* 5.6*  ALBUMIN 2.6* 2.5*   No results for input(s): LIPASE, AMYLASE in the last 72 hours. CBC:  Recent Labs  03/22/16 0417 03/23/16 0405  WBC 11.2* 8.7  HGB 12.9* 12.4*  HCT 40.3 39.1  MCV 70.2* 70.2*  PLT 184 158   Cardiac Enzymes: No results for input(s): CKTOTAL, CKMB, CKMBINDEX, TROPONINI in the last 72 hours. BNP: Invalid input(s): POCBNP D-Dimer: No results for input(s): DDIMER in the last 72 hours. Hemoglobin A1C: No results for input(s): HGBA1C in the last 72 hours. Fasting Lipid Panel:  Recent Labs  03/21/16 1245  TRIG 150*   Thyroid Function Tests: No results for input(s): TSH, T4TOTAL, T3FREE, THYROIDAB in the last 72 hours.  Invalid input(s): FREET3 Anemia Panel: No results for input(s): VITAMINB12, FOLATE,  FERRITIN, TIBC, IRON, RETICCTPCT in the last 72 hours.  RADIOLOGY: Ct Head Wo Contrast  Result Date: 03/21/2016 CLINICAL DATA:  Ventricular fibrillation arrest at home. Post anoxic myoclonus. No meaningful response to external stimuli. EXAM: CT HEAD WITHOUT CONTRAST TECHNIQUE: Contiguous axial images were obtained from the base of the skull through the vertex without intravenous contrast. COMPARISON:  Portable CT scan 03/22/2016. FINDINGS: Brain: Muted gray-white differentiation throughout the cerebral hemispheres, without frank reversal of the normal attenuation relationship between gray and white matter, nevertheless concerning for early hypoxic ischemic injury. Chronic RIGHT occipital infarct, well-demarcated, and stable. No secondary signs of increased intracranial pressure. Premature for age atrophy. No hemorrhage, mass lesion, or extra-axial fluid. Vascular: Vascular calcification.  No hyperdense vessel proximally. Skull: Unremarkable. Asymmetric thickness and hyperattenuation of the scalp soft tissues over the LEFT convexity, suggesting scalp hematoma, new/increased from portable CT. Sinuses/Orbits: Layering fluid in the sphenoid and mastoids reflect recumbency and intubation. Other: None. IMPRESSION: Muted gray-white differentiation similar to portable CT scan performed on 08/18. Early hypoxic ischemic injury is not excluded. No signs of large vessel occlusion, increased intracranial pressure, or intracranial hemorrhage. Continued surveillance is warranted. Electronically Signed   By: Staci Righter M.D.   On: 03/21/2016 09:44   Dg Chest Port 1 View  Result Date: 03/23/2016 CLINICAL DATA:  Shortness of breath. EXAM: PORTABLE CHEST 1 VIEW COMPARISON:  03/22/2016. FINDINGS: Endotracheal tube, NG tube, right IJ line stable position. AICD in stable position. Cardiomegaly with pulmonary vascular prominence and bilateral interstitial prominence bilateral pleural effusions. Findings consistent with  congestive heart failure no pneumothorax. IMPRESSION: 1.  Lines and tubes in stable position. 2. AICD in stable position. Cardiomegaly with pulmonary vascular prominence and bilateral interstitial prominence bilateral pleural effusions consistent congestive heart failure with pulmonary interstitial edema. Similar findings noted on prior exam Electronically Signed   By: [place]   On: 03/23/2016 07:00   Dg Chest Port 1 View  Result Date: 03/22/2016 CLINICAL DATA:  Acute respiratory failure EXAM: PORTABLE CHEST 1 VIEW COMPARISON:  Chest radiograph 03/21/2016 FINDINGS: Endotracheal tube tip is at the level of the clavicular heads. A right IJ central venous catheter overlies the lower SVC. Unchanged appearance of left chest wall pacemaker/AICD. Aeration of the lungs has improved. There is persistent pulmonary edema, decreased from the prior study. Small left pleural effusion. No pneumothorax. Persistent cardiomegaly. IMPRESSION: Mildly improved lung inflation with decreased pulmonary edema. Electronically Signed   By: Ulyses Jarred M.D.   On: 03/22/2016 05:58   [facility]  Result Date: 03/21/2016 CLINICAL DATA:  Acute respiratory failure. EXAM: PORTABLE CHEST 1 VIEW COMPARISON:  03/16/2016. FINDINGS: The heart remains enlarged. BILATERAL pulmonary opacities favored to represent pulmonary edema, worse from priors. Endotracheal tube tip satisfactory position, 3.9 cm above carina. Dual lead pacer unchanged. Central venous catheter tip proximal RIGHT atrium from RIGHT IJ approach. IMPRESSION: Worsening aeration.  Cardiomegaly with pulmonary edema. Electronically Signed   By: Staci Righter M.D.   On: 03/21/2016 07:43   Dg Chest Port 1 View  Result Date: 03/24/2016 CLINICAL DATA:  Central catheter placement EXAM: PORTABLE CHEST 1 VIEW COMPARISON:  Study obtained earlier in the day FINDINGS: Central catheter tip is in the superior vena cava. Nasogastric tube tip and side port are below the  diaphragm. Endotracheal tube tip is 5.4 cm above the carina. Pacemaker leads are attached to the right atrium, right ventricle, and left ventricle, stable. No pneumothorax. There is atelectatic change in the left base. Lungs elsewhere clear. There is cardiomegaly. The pulmonary vascularity is within normal limits. No adenopathy. IMPRESSION: Tube and catheter positions as described without pneumothorax. Stable cardiomegaly. Left base atelectasis, unchanged. No new opacity. Electronically Signed   By: Lowella Grip III M.D.   On: 03/09/2016 14:10   Dg Chest Port 1 View  Result Date: 03/31/2016 CLINICAL DATA:  Evaluate endotracheal tube placement. EXAM: PORTABLE CHEST 1 VIEW COMPARISON:  01/12/2016 FINDINGS: Endotracheal tube is 4.5 cm above the carina. The cardiac silhouette is enlarged. There is a left-sided biventricular ICD. Haziness and increased densities in the perihilar regions are suggestive for vascular congestion and edema. Negative for a pneumothorax. IMPRESSION: Endotracheal tube is appropriately positioned. Cardiomegaly with central vascular congestion and mild edema. Electronically Signed   By: Markus Daft M.D.   On: 03/28/2016 10:30   Ct Portable Head W/o Cm  Result Date: 03/09/2016 CLINICAL DATA:  Status post cardiac arrest today for 8 minutes. EXAM: CT HEAD WITHOUT CONTRAST TECHNIQUE: Contiguous axial images were obtained from the base of the skull through the vertex without intravenous contrast. COMPARISON:  05/21/2008. FINDINGS: Study was performed with a portable apparatus. Image quality is reduced. Brain: There is a area of 27 x 16 mm hypoattenuation in the RIGHT occipital lobe, uncertain significance and duration. This was not present in 2009, but image quality is insufficient to distinguish between an acute versus chronic infarct. Hypoattenuation due to a mass is less likely. Poor gray-white differentiation throughout the cerebral hemispheres. Anoxic- ischemic injury is not excluded.  No hemorrhage, hydrocephalus, or extra-axial fluid. Mild atrophy, premature for age. Vascular: No hyperdense vessel.  Carotid siphon calcification. Skull: Negative Sinuses/Orbits: Negative Other: None. IMPRESSION: Poor gray-white differentiation throughout the cerebral hemispheres. Anoxic ischemic injury is not excluded. Superficial RIGHT occipital lobe area of hypoattenuation, approximately the 3 x 1.5 cm, could represent an acute or chronic infarct. Continued surveillance warranted. No intracranial hemorrhage or large vessel occlusion. Electronically Signed   By: Staci Righter M.D.   On: 03/06/2016 20:16    PHYSICAL EXAM CVP 2-6 General: Intubated/sedated.  Neck: No JVD, no thyromegaly or thyroid nodule. Eyelid fluttering Lungs: Coarse throughout. . CV: Lateral PMI.  Heart regular S1/S2, no S3/S4, no murmur.  No peripheral edema.   Abdomen: Soft, nontender, no hepatosplenomegaly, no distention.  Neurologic: Intubated, not responsive. Eye lid twitching.  Extremities: No clubbing or cyanosis. R and LLE SCDs.   TELEMETRY: Reviewed telemetry pt in a-paced, BiV paced  ASSESSMENT AND PLAN: 55 yo with history of systolic HF due to ischemic cardiomyopathy (EF  20-25%) S/P [employer] Jude bi-V ICD implanted October 2016, PAFL, hypertension, diabetes mellitus, hyperlipidemia who presents with VT/VF arrest at home.  1. Cardiac arrest: VF arrest at home.  Coronary angiography this admission showed stable coronary anatomy compared to post-PCI in 4/17.  Suspect primary arrhythmic arrest. Rewarmed.  - Continue amiodarone gtt for now.  2. Chronic systolic CHF: EF 87-86%, ischemic cardiomyopathy.  Now on 18 mcg norepi. CVP 3.  Continue digoxin. Dose lowered with elevated level.  - Off diuretics. CVP ok. Creatinine coming dwon 1.6>1.4  >1.21  3. CAD: Stable anatomy on angiography. Continue Plavix, statin.  4. Atrial flutter: s/p ablation.  Had been on warfarin.  Holding warfarin for now, will use bivalirudin when INR <  2 (no heparin/Lovenox with religious beliefs).  INR 1.57 oday.  5. Possible aspiration PNA: On Unasyn per CCM.  6. Neuro: He has been rewarmed and sedation is off.  No meaningful response. Has started to have post-anoxic myoclonus.  EEG abnormal . Possible MRI today. reconery seems doubtful.  7. DNR.     Amy Clegg NP-C  03/23/2016 7:28 AM  Patient seen and examined with Darrick Grinder, NP. We discussed all aspects of the encounter. I agree with the assessment and plan as stated above.   Remains on norepi but otherwise stable from a CV standpoint. Unfortunately, he continues to remain unresponsive. Propofol dose way down. EEG report reviewed and is suggestive of severe anoxic injury. Long discussion with family yesterday and CCM about poor prognosis. Now DNR. Will likely need to re-discuss possibility of support withdrawal in the next day or two.   The patient is critically ill with multiple organ systems failure and requires high complexity decision making for assessment and support, frequent evaluation and titration of therapies, application of advanced monitoring technologies and extensive interpretation of multiple databases.   Critical Care Time devoted to patient care services described in this note is 35 Minutes.  Alanis Clift,MD 8:39 AM

## 2016-03-23 NOTE — Progress Notes (Signed)
Subjective:  Benjamin [name] continues to have periodic discharges on the EEG. These are associated with clinical myoclonus. He is having twitching movements of the eyelids. He remains on a low dose of propofol. His case was discussed with numerous family members yesterday. Their questions were answered. It was explained that this likely represents a severe anoxic brain injury with post anoxic myoclonus which is a grim prognostic sign.  Exam: Vitals:   03/23/16 0700 03/23/16 0729  BP: 115/73   Pulse: 60 (!) 59  Resp: (!) 24 (!) 24  Temp:      HEENT-  Normocephalic, no lesions, without obvious abnormality.  Normal external eye and conjunctiva.  Normal TM's bilaterally.  Normal auditory canals and external ears. Normal external nose, mucus membranes and septum.  Normal pharynx. Cardiovascular- regular rate and rhythm, S1, S2 normal, no murmur, click, rub or gallop, pulses palpable throughout   Lungs- chest clear, no wheezing, rales, normal symmetric air entry, Heart exam - S1, S2 normal, no murmur, no gallop, rate regular Abdomen- NT Extremities- less then 2 second capillary refill   Gen: In bed, NAD MS: On propofol, no response to stimuli. CN: Sluggish oculocephalics. Pupils are sluggishly reactive. Minimal corneal response. Intubated, no clear facial asymmetry. Motor: No response to stimulation. Sensory: No response.  Pertinent Labs/Diagnostics: Reviewed.    Impression:   Haley continues to have constant periodic epileptiform discharges. He continues on Keppra and Depakote. This is associated with clinical myoclonus. He has cough, sluggish corneal, but no gag reflex. He remains on propofol mainly to reduce clinical myoclonic activity. The CT reveals evidence suggestive of anoxic brain injury. MRI could further confirm the extent of the injury. Extensive meetings with the family have been held to discuss the clinical condition and prognosis.   Recommendations:  1. Await family decision  regarding further plans for care.   James A. Hilda Blades, M.D. Neurohospitalist Phone: [phone]   03/23/2016, 8:50 AM

## 2016-03-24 ENCOUNTER — Inpatient Hospital Stay (HOSPITAL_COMMUNITY): Payer: Medicaid Other

## 2016-03-24 LAB — BLOOD GAS, ARTERIAL
Acid-base deficit: 2.6 mmol/L — ABNORMAL HIGH (ref 0.0–2.0)
BICARBONATE: 21.6 meq/L (ref 20.0–24.0)
Drawn by: 10006
FIO2: 40
LHR: 16 {breaths}/min
O2 SAT: 98.5 %
PATIENT TEMPERATURE: 98.2
PCO2 ART: 36.3 mmHg (ref 35.0–45.0)
PEEP/CPAP: 5 cmH2O
PH ART: 7.391 (ref 7.350–7.450)
TCO2: 22.7 mmol/L (ref 0–100)
VT: 530 mL
pO2, Arterial: 141 mmHg — ABNORMAL HIGH (ref 80.0–100.0)

## 2016-03-24 LAB — CULTURE, BLOOD (ROUTINE X 2)
CULTURE: NO GROWTH
CULTURE: NO GROWTH

## 2016-03-24 LAB — GLUCOSE, CAPILLARY
GLUCOSE-CAPILLARY: 114 mg/dL — AB (ref 65–99)
GLUCOSE-CAPILLARY: 160 mg/dL — AB (ref 65–99)
GLUCOSE-CAPILLARY: 149 mg/dL — AB (ref 65–99)
Glucose-Capillary: 135 mg/dL — ABNORMAL HIGH (ref 65–99)
Glucose-Capillary: 121 mg/dL — ABNORMAL HIGH (ref 65–99)
Glucose-Capillary: 146 mg/dL — ABNORMAL HIGH (ref 65–99)

## 2016-03-24 LAB — PROTIME-INR
INR: 2.31
Prothrombin Time: 25.8 seconds — ABNORMAL HIGH (ref 11.4–15.2)

## 2016-03-24 LAB — CBC
HEMATOCRIT: 37.8 % — AB (ref 39.0–52.0)
Hemoglobin: 11.7 g/dL — ABNORMAL LOW (ref 13.0–17.0)
MCH: 22.2 pg — AB (ref 26.0–34.0)
MCHC: 31 g/dL (ref 30.0–36.0)
MCV: 71.9 fL — AB (ref 78.0–100.0)
PLATELETS: 154 10*3/uL (ref 150–400)
RBC: 5.26 MIL/uL (ref 4.22–5.81)
RDW: 21.8 % — AB (ref 11.5–15.5)
WBC: 6.8 10*3/uL (ref 4.0–10.5)

## 2016-03-24 LAB — APTT: APTT: 73 s — AB (ref 24–36)

## 2016-03-24 LAB — BASIC METABOLIC PANEL
Anion gap: 5 (ref 5–15)
BUN: 15 mg/dL (ref 6–20)
CO2: 24 mmol/L (ref 22–32)
Calcium: 8.1 mg/dL — ABNORMAL LOW (ref 8.9–10.3)
Chloride: 111 mmol/L (ref 101–111)
Creatinine, Ser: 1.03 mg/dL (ref 0.61–1.24)
GFR calc Af Amer: >60 mL/min (ref >60)
GLUCOSE: 124 mg/dL — AB (ref 65–99)
POTASSIUM: 4.5 mmol/L (ref 3.5–5.1)
Sodium: 140 mmol/L (ref 135–145)

## 2016-03-24 LAB — HEPATIC FUNCTION PANEL
ALBUMIN: 2.4 g/dL — AB (ref 3.5–5.0)
ALK PHOS: 57 U/L (ref 38–126)
ALT: 23 U/L (ref 17–63)
AST: 28 U/L (ref 15–41)
BILIRUBIN TOTAL: 0.7 mg/dL (ref 0.3–1.2)
Bilirubin, Direct: 0.2 mg/dL (ref 0.1–0.5)
Indirect Bilirubin: 0.5 mg/dL (ref 0.3–0.9)
Total Protein: 5.3 g/dL — ABNORMAL LOW (ref 6.5–8.1)

## 2016-03-24 LAB — MAGNESIUM: Magnesium: 2.2 mg/dL (ref 1.7–2.4)

## 2016-03-24 LAB — TRIGLYCERIDES: Triglycerides: 209 mg/dL — ABNORMAL HIGH (ref <150)

## 2016-03-24 LAB — PHOSPHORUS: Phosphorus: 3.6 mg/dL (ref 2.5–4.6)

## 2016-03-24 MED ORDER — PRO-STAT SUGAR FREE PO LIQD
30.0000 mL | Freq: Every day | ORAL | Status: DC
  Administered 2016-03-24 – 2016-03-28 (×5): 30 mL
  Filled 2016-03-24 (×5): qty 30

## 2016-03-24 MED ORDER — VITAL HIGH PROTEIN PO LIQD
1000.0000 mL | ORAL | Status: DC
  Administered 2016-03-24: 1000 mL
  Administered 2016-03-24: 19:00:00
  Administered 2016-03-25 – 2016-03-26 (×2): 1000 mL
  Administered 2016-03-26 – 2016-03-27 (×2)
  Administered 2016-03-27 – 2016-03-28 (×2): 1000 mL

## 2016-03-24 NOTE — Progress Notes (Signed)
Subjective:  Benjamin [name] continues to have periodic discharges on the EEG. These are associated with clinical myoclonus. In discussion with the nursing team and ICU team today, it seems the family is preparing to make a decision on Friday with regards to terminating support.  Exam: Vitals:   03/24/16 0800 03/24/16 0900  BP: 94/69 123/82  Pulse: 60 60  Resp: 18 18  Temp: 97.5 F (36.4 C) 98.2 F (36.8 C)    HEENT-  Normocephalic, no lesions, without obvious abnormality.  Normal external eye and conjunctiva.  Normal TM's bilaterally.  Normal auditory canals and external ears. Normal external nose, mucus membranes and septum.  Normal pharynx. Cardiovascular- regular rate and rhythm, S1, S2 normal, no murmur, click, rub or gallop, pulses palpable throughout   Lungs- chest clear, no wheezing, rales, normal symmetric air entry, Heart exam - S1, S2 normal, no murmur, no gallop, rate regular Abdomen- soft, non-tender; bowel sounds normal; no masses,  no organomegaly Extremities- less then 2 second capillary refill   Gen: In bed, NAD MS: No response to command. Still on low dose of sedation. CN: Pupils are sluggishly reactive. Corneal reflexes present. Cough reflex is present. Motor: No response to stimulation. Sensory: No response to stimulation.   Pertinent Labs/Diagnostics: Reviewed.    Impression:   Irie continues to have generalized periodic discharges consistent with his suspected history of anoxic brain injury. After discussion with the ICU team and nursing team, it seems best to discontinue the sedation to evaluate his baseline function at this time. Given that the discharges are a manifestation of anoxic brain injury, there is no benefit to further evaluation or treatment of these discharges. We will have EEG disconnected at this time. It appears the family is close to making a decision regarding withdrawal of support.   Recommendations:  1. We will discontinue EEG at this  time.  2. We'll continue to follow his progress.   Tyteanna Ost A. Hilda Blades, M.D. Neurohospitalist Phone: [phone]   03/24/2016, 10:07 AM

## 2016-03-24 NOTE — Progress Notes (Addendum)
Nutrition Follow-up  DOCUMENTATION CODES:   Obesity unspecified  INTERVENTION:   Initiate TF via OGT with Vital High Protein at goal rate of 45 ml/h (1080 ml per day) and Prostat 30 ml TID to provide 1380 kcals, 140 gm protein, 907 ml free water daily.  NUTRITION DIAGNOSIS:   Inadequate oral intake related to inability to eat as evidenced by NPO status.  Ongoing  GOAL:   Patient will meet greater than or equal to 90% of their needs  Unmet  MONITOR:   Vent status, Labs, Weight trends, Skin, I & O's  REASON FOR ASSESSMENT:   Consult Enteral/tube feeding initiation and management  ASSESSMENT:   55 year old male with known DM, HTN, DLD, & Systolic CHF (EF 64-33%). VF arrest w/ 12 min until ROSC. Initiated therapeutic hypothermia.  RD received consult for TF initiation and management.   Patient remains intubated on ventilator support MV: 11.3 L/min Temp (24hrs), Avg:98.2 F (36.8 C), Min:97.5 F (36.4 C), Max:99.5 F (37.5 C)  Propofol: n/a  Case discussed with RN. Propofol has been stopped secondary to monitoring seizure activity. Per neurology notes, EEG has been consistent with anoxic brain injury; pt with very poor prognosis with minimal hope of meaningful survival. Goals of care discussions have been ongoing, but pt family members have been considering withdrawal of support, potentially on Friday.   Labs reviewed: CBGS: 114-121.   Diet Order:   NPO  Skin:  Reviewed, no issues  Last BM:  Unknown  Height:   Ht Readings from Last 1 Encounters:  03/21/2016 5\' 7"  (1.702 m)    Weight:   Wt Readings from Last 1 Encounters:  03/22/16 232 lb 13.2 oz (105.6 kg)    Ideal Body Weight:  67.27 kg  BMI:  Body mass index is 36.47 kg/m.  Estimated Nutritional Needs:   Kcal:  2951-8841  Protein:  >/= 135 grams  Fluid:  Per MD  EDUCATION NEEDS:   No education needs identified at this time  Ahlana Slaydon A. Mayford Knife, RD, LDN, CDE Pager: [phone] After hours  Pager: [phone]

## 2016-03-24 NOTE — Procedures (Signed)
Continuous Video-EEG Monitoring Report   Patient: Benjamin [name], Benjamin [name]        EEG No.ID: [id number]        DOB: 03/24/1961 Age: 55 Room#2H08  MED REC NO: [id number] Gender: Male    TECH: Marianne Sofia        Physician: Ronnie Doss        Referring Physician: Ritta Slot        Report Date: 03/24/2016           Study Duration:  03/23/2016 07:30 to 03/24/2016 07:30 CPT Code:  63785 Diagnosis:   Altered mental status     History: This is a 55 year old male presenting with altered mental status after an episode of cardiac arrest.  Video-EEG monitoring was performed to evaluate for seizures.   Technical Details:  Long-term video-EEG monitoring was performed using standard setting per the guidelines.  Briefly, a minimum of 21 electrodes were placed on scalp according to the International 10-20 or/and 10-10 Systems.  Supplemental electrodes were placed as needed.  Single EKG electrode was also used to detect cardiac arrhythmia.  Patient's behavior was continuously recorded on video simultaneously with EEG.  A minimum of 16 channels were used for data display.  Each epoch of study was reviewed manually daily and as needed using standard referential and bipolar montages.     EEG Description:  The background was discontinuous, consisting of periods of suppression and periods of generalized delta and theta slowing.  Diffuse beta activity was seen, most likely due to medication effect of propofol.  Intermittent GPEDs (generalized periodic epileptiform discharges) were present, but the frequency of the GPEDs were <1 Hz.  No seizure was present.     Impression:  This is an abnormal EEG due to the presence of the following: 1) Background discontinuity, suggesting severe encephalopathy, but medication effect (propofol) could not be excluded; 2) GPEDs.  No seizure was present.     Reading Physician: Ronnie Doss, MD, PhD

## 2016-03-24 NOTE — Progress Notes (Signed)
Patient ID: Benjamin [name], male   DOB: [birth year], 55 y.o.   MRN: [id number]   ADVANCED HF PROGRESS NOTE  Had been doing well just returned from trip to [place] with his wife. Was in bed 8/18 am and wife went to bathroom. She hear him making a strange sound so came out of bathroom and found him unresponsive on the bed. She called her son who was downstairs. He called 911 and immediately stadted CPR. While doing CPR he felt ICD shock. Intubated in field and brought to ER. In ER, unresponsive but had stable BP and HR. Patient did not complain of CP prior to arrest.  ICD interrogated showed VF that was just below the 200 beat threshold for a while (poor R wave detection) and finally received shock back to NSR. In ER decision made to take emergently to cath lab for coronary angiogram and beginning cooling process. Coronary angiography was stable compared to post-PCI in 4/17, suspect primary arrhythmic event.   Over the weekend had Myoclonus is on Keppra now.  No meaningful response.  CT possibly suggestive of anoxic injury.   Yesterday norepi weaned to 4 mcg.     Scheduled Meds: . ampicillin-sulbactam (UNASYN) IV  3 g Intravenous Q8H  . antiseptic oral rinse  7 mL Mouth Rinse 10 times per day  . chlorhexidine gluconate (SAGE KIT)  15 mL Mouth Rinse BID  . clopidogrel  75 mg Per Tube Daily  . digoxin  0.0625 mg Oral Daily  . famotidine (PEPCID) IV  20 mg Intravenous Q12H  . insulin aspart  0-15 Units Subcutaneous Q4H  . insulin glargine  10 Units Subcutaneous Daily  . levETIRAcetam  1,000 mg Intravenous BID  . rosuvastatin  20 mg Per Tube q1800  . sodium chloride flush  10-40 mL Intracatheter Q12H  . sodium chloride flush  3 mL Intravenous Q12H  . valproate sodium  500 mg Intravenous Q8H   Continuous Infusions: . amiodarone 30 mg/hr (03/24/16 0212)  . bivalirudin (ANGIOMAX) infusion 0.5 mg/mL (Non-ACS indications) 0.05 mg/kg/hr (03/23/16 2141)  . norepinephrine (LEVOPHED) Adult infusion 4  mcg/min (03/24/16 0531)  . propofol (DIPRIVAN) infusion 50 mcg/kg/min (03/24/16 0514)   PRN Meds:.sodium chloride, acetaminophen, fentaNYL (SUBLIMAZE) injection, hydrALAZINE, labetalol, midazolam, ondansetron (ZOFRAN) IV, sodium chloride flush, sodium chloride flush     Vitals:   03/24/16 0545 03/24/16 0600 03/24/16 0615 03/24/16 0717  BP: 108/74 100/71 100/70 106/73  Pulse: 60 60 60 60  Resp: (!) 21 (!) _0 Temp:      TempSrc:      SpO2: 100% 100% 100% 100%  Weight:      Height:        Intake/Output Summary (Last 24 hours) at 03/24/16 0749 Last data filed at 03/24/16 0600  Gross per 24 hour  Intake           2439.6 ml  Output             1070 ml  Net           1369.6 ml    LABS: Basic Metabolic Panel:  Recent Labs  03/23/16 0405 03/24/16 0340  NA 140 140  K 3.6 4.5  CL 109 111  CO2 22 24  GLUCOSE 125* 124*  BUN 20 15  CREATININE 1.21 1.03  CALCIUM 8.2* 8.1*  MG 1.7 2.2  PHOS 3.6 3.6   Liver Function Tests:  Recent Labs  03/23/16 0405 03/24/16 0340  AST 35 28  ALT 31  23  ALKPHOS 56 57  BILITOT 0.7 0.7  PROT 5.6* 5.3*  ALBUMIN 2.5* 2.4*   No results for input(s): LIPASE, AMYLASE in the last 72 hours. CBC:  Recent Labs  03/23/16 0405 03/24/16 0340  WBC 8.7 6.8  HGB 12.4* 11.7*  HCT 39.1 37.8*  MCV 70.2* 71.9*  PLT 158 PENDING   Cardiac Enzymes: No results for input(s): CKTOTAL, CKMB, CKMBINDEX, TROPONINI in the last 72 hours. BNP: Invalid input(s): POCBNP D-Dimer: No results for input(s): DDIMER in the last 72 hours. Hemoglobin A1C: No results for input(s): HGBA1C in the last 72 hours. Fasting Lipid Panel:  Recent Labs  03/21/16 1245  TRIG 150*   Thyroid Function Tests: No results for input(s): TSH, T4TOTAL, T3FREE, THYROIDAB in the last 72 hours.  Invalid input(s): FREET3 Anemia Panel: No results for input(s): VITAMINB12, FOLATE, FERRITIN, TIBC, IRON, RETICCTPCT in the last 72 hours.  RADIOLOGY: Ct Head Wo  Contrast  Result Date: 03/21/2016 CLINICAL DATA:  Ventricular fibrillation arrest at home. Post anoxic myoclonus. No meaningful response to external stimuli. EXAM: CT HEAD WITHOUT CONTRAST TECHNIQUE: Contiguous axial images were obtained from the base of the skull through the vertex without intravenous contrast. COMPARISON:  Portable CT scan 03/26/2016. FINDINGS: Brain: Muted gray-white differentiation throughout the cerebral hemispheres, without frank reversal of the normal attenuation relationship between gray and white matter, nevertheless concerning for early hypoxic ischemic injury. Chronic RIGHT occipital infarct, well-demarcated, and stable. No secondary signs of increased intracranial pressure. Premature for age atrophy. No hemorrhage, mass lesion, or extra-axial fluid. Vascular: Vascular calcification.  No hyperdense vessel proximally. Skull: Unremarkable. Asymmetric thickness and hyperattenuation of the scalp soft tissues over the LEFT convexity, suggesting scalp hematoma, new/increased from portable CT. Sinuses/Orbits: Layering fluid in the sphenoid and mastoids reflect recumbency and intubation. Other: None. IMPRESSION: Muted gray-white differentiation similar to portable CT scan performed on 08/18. Early hypoxic ischemic injury is not excluded. No signs of large vessel occlusion, increased intracranial pressure, or intracranial hemorrhage. Continued surveillance is warranted. Electronically Signed   By: Staci Righter M.D.   On: 03/21/2016 09:44   Dg Chest Port 1 View  Result Date: 03/24/2016 CLINICAL DATA:  Shortness of breath. EXAM: PORTABLE CHEST 1 VIEW COMPARISON:  03/23/2016. FINDINGS: Endotracheal tube, NG tube, right IJ line in stable position. AICD in stable position. Cardiomegaly with bilateral pulmonary vascular prominence interstitial prominence with bilateral pleural effusions again noted. Findings consistent congestive heart failure. Slight interim worsening. No pneumothorax .  IMPRESSION: 1. Lines and tubes in stable position. 2. AICD in stable position. Congestive heart failure with bilateral pulmonary edema and bilateral pleural effusions. Slight worsening from prior exam. Electronically Signed   By: Marcello Moores  Register   On: 03/24/2016 06:53   Dg Chest Port 1 View  Result Date: 03/23/2016 CLINICAL DATA:  Shortness of breath. EXAM: PORTABLE CHEST 1 VIEW COMPARISON:  03/22/2016. FINDINGS: Endotracheal tube, NG tube, right IJ line stable position. AICD in stable position. Cardiomegaly with pulmonary vascular prominence and bilateral interstitial prominence bilateral pleural effusions. Findings consistent with congestive heart failure no pneumothorax. IMPRESSION: 1.  Lines and tubes in stable position. 2. AICD in stable position. Cardiomegaly with pulmonary vascular prominence and bilateral interstitial prominence bilateral pleural effusions consistent congestive heart failure with pulmonary interstitial edema. Similar findings noted on prior exam Electronically Signed   By: [place]   On: 03/23/2016 07:00   Dg Chest Port 1 View  Result Date: 03/22/2016 CLINICAL DATA:  Acute respiratory failure EXAM: PORTABLE CHEST 1 VIEW  COMPARISON:  Chest radiograph 03/21/2016 FINDINGS: Endotracheal tube tip is at the level of the clavicular heads. A right IJ central venous catheter overlies the lower SVC. Unchanged appearance of left chest wall pacemaker/AICD. Aeration of the lungs has improved. There is persistent pulmonary edema, decreased from the prior study. Small left pleural effusion. No pneumothorax. Persistent cardiomegaly. IMPRESSION: Mildly improved lung inflation with decreased pulmonary edema. Electronically Signed   By: Ulyses Jarred M.D.   On: 03/22/2016 05:58   Dg Chest Port 1 View  Result Date: 03/21/2016 CLINICAL DATA:  Acute respiratory failure. EXAM: PORTABLE CHEST 1 VIEW COMPARISON:  04/01/2016. FINDINGS: The heart remains enlarged. BILATERAL pulmonary opacities  favored to represent pulmonary edema, worse from priors. Endotracheal tube tip satisfactory position, 3.9 cm above carina. Dual lead pacer unchanged. Central venous catheter tip proximal RIGHT atrium from RIGHT IJ approach. IMPRESSION: Worsening aeration.  Cardiomegaly with pulmonary edema. Electronically Signed   By: Staci Righter M.D.   On: 03/21/2016 07:43   Dg Chest Port 1 View  Result Date: 03/22/2016 CLINICAL DATA:  Central catheter placement EXAM: PORTABLE CHEST 1 VIEW COMPARISON:  Study obtained earlier in the day FINDINGS: Central catheter tip is in the superior vena cava. Nasogastric tube tip and side port are below the diaphragm. Endotracheal tube tip is 5.4 cm above the carina. Pacemaker leads are attached to the right atrium, right ventricle, and left ventricle, stable. No pneumothorax. There is atelectatic change in the left base. Lungs elsewhere clear. There is cardiomegaly. The pulmonary vascularity is within normal limits. No adenopathy. IMPRESSION: Tube and catheter positions as described without pneumothorax. Stable cardiomegaly. Left base atelectasis, unchanged. No new opacity. Electronically Signed   By: Lowella Grip III M.D.   On: 03/11/2016 14:10   Dg Chest Port 1 View  Result Date: 03/18/2016 CLINICAL DATA:  Evaluate endotracheal tube placement. EXAM: PORTABLE CHEST 1 VIEW COMPARISON:  01/12/2016 FINDINGS: Endotracheal tube is 4.5 cm above the carina. The cardiac silhouette is enlarged. There is a left-sided biventricular ICD. Haziness and increased densities in the perihilar regions are suggestive for vascular congestion and edema. Negative for a pneumothorax. IMPRESSION: Endotracheal tube is appropriately positioned. Cardiomegaly with central vascular congestion and mild edema. Electronically Signed   By: Markus Daft M.D.   On: 03/12/2016 10:30   Ct Portable Head W/o Cm  Result Date: 03/06/2016 CLINICAL DATA:  Status post cardiac arrest today for 8 minutes. EXAM: CT HEAD  WITHOUT CONTRAST TECHNIQUE: Contiguous axial images were obtained from the base of the skull through the vertex without intravenous contrast. COMPARISON:  05/21/2008. FINDINGS: Study was performed with a portable apparatus. Image quality is reduced. Brain: There is a area of 27 x 16 mm hypoattenuation in the RIGHT occipital lobe, uncertain significance and duration. This was not present in 2009, but image quality is insufficient to distinguish between an acute versus chronic infarct. Hypoattenuation due to a mass is less likely. Poor gray-white differentiation throughout the cerebral hemispheres. Anoxic- ischemic injury is not excluded. No hemorrhage, hydrocephalus, or extra-axial fluid. Mild atrophy, premature for age. Vascular: No hyperdense vessel.  Carotid siphon calcification. Skull: Negative Sinuses/Orbits: Negative Other: None. IMPRESSION: Poor gray-white differentiation throughout the cerebral hemispheres. Anoxic ischemic injury is not excluded. Superficial RIGHT occipital lobe area of hypoattenuation, approximately the 3 x 1.5 cm, could represent an acute or chronic infarct. Continued surveillance warranted. No intracranial hemorrhage or large vessel occlusion. Electronically Signed   By: Staci Righter M.D.   On: 03/10/2016 20:16  PHYSICAL EXAM CVP 4-5 General: Intubated/sedated.  Neck: No JVD, no thyromegaly or thyroid nodule.Lungs: Coarse throughout.  CV: Lateral PMI.  Heart regular S1/S2, no S3/S4, no murmur.  No peripheral edema.   Abdomen: Soft, nontender, no hepatosplenomegaly, no distention.  Neurologic: Intubated, not responsive. Extremities: No clubbing or cyanosis. R and LLE SCDs.   TELEMETRY: Reviewed telemetry pt in a-paced, BiV paced  ASSESSMENT AND PLAN: 55 yo with history of systolic HF due to ischemic cardiomyopathy (EF 20-25%) S/P [employer] bi-V ICD implanted October 2016, PAFL, hypertension, diabetes mellitus, hyperlipidemia who presents with VT/VF arrest at home.  1.  Cardiac arrest: VF arrest at home.  Coronary angiography this admission showed stable coronary anatomy compared to post-PCI in 4/17.  Suspect primary arrhythmic arrest. Rewarmed.  - Continue amiodarone gtt for now.  2. Chronic systolic CHF: EF 01-60%, ischemic cardiomyopathy.  Now on  4 mcg norepi. CVP 5.  Continue digoxin. Dose lowered with elevated level.  - Off diuretics. CVP ok. Creatinine coming down 1.6>1.4  >1.21 >1.0  3. CAD: Stable anatomy on angiography. Continue Plavix, statin.  4. Atrial flutter: s/p ablation.  Had been on warfarin.  Holding warfarin for now, will use bivalirudin when INR < 2 (no heparin/Lovenox with religious beliefs).  INR 2.3  5. Possible aspiration PNA: On Unasyn per CCM.  6. Neuro: He has been rewarmed and sedation is off.  No meaningful response. Has started to have post-anoxic myoclonus.  EEG abnormal . Possible MRI today. reconery seems doubtful.  7. DNR.   CCM meeting with family. Possible withdrawl Friday.       Amy Clegg NP-C  03/24/2016 7:49 AM   Patient seen and examined with Darrick Grinder, NP. We discussed all aspects of the encounter. I agree with the assessment and plan as stated above.   Discussed with Dr. Nelda Marseille and patient's son at bedside. Patient with ongoing seizure activity related to severe anoxic brain injury. Family initially planning on withdrawal Friday but now patient's brother wanting to keep pressing on with therapy. I reiterated to them that there is no hope for meaningful survival and Mr. Vise would not want to be kept alive like this. They do not want trach/PEG. We will continuet o d/w family later today. Otherwise stable on low-dose norepi.   The patient is critically ill with multiple organ systems failure and requires high complexity decision making for assessment and support, frequent evaluation and titration of therapies, application of advanced monitoring technologies and extensive interpretation of multiple databases.   Critical  Care Time devoted to patient care services described in this note is 35 Minutes.  , ,MD 9:01 AM

## 2016-03-24 NOTE — Progress Notes (Signed)
MRI cancelled due to patient's pacemaker.  No family present at bedside, called patient's son Benjamin [name] to inform him that MRI was cancelled and reason why MRI cancelled.

## 2016-03-24 NOTE — Progress Notes (Signed)
PULMONARY / CRITICAL CARE MEDICINE   Name: Benjamin [name] MRN: [id number] DOB: 12-15-1960    ADMISSION DATE:  10/03/15 CONSULTATION DATE:  10/03/15  REFERRING MD:  Loren Raceravid Yelverton, M.D. / EDP  CHIEF COMPLAINT:  Post Arrest  HISTORY OF PRESENT ILLNESS:  Per records patient was a witnessed arrest with CPR initiated by his son. On arrival patient was in PEA. Patient received approximately 12 minutes of CPR total as well as bolus epinephrine. Patient lost his pulse in the midst of transport to the emergency department but regained it prior to arrival. Rhythm was paced upon arrival in the emergency department. Endotracheal tube placed by EMS. Family confirm the patient did not have any purposeful movement or open his eyes at all during his resuscitation at home. Additionally his son noted that his AICD did fire as he was doing CPR. The wife found him laying on the bed, unresponsive, with abnormal respirations. She promptly called 911 & her son started CPR. PCCM was consulted for vent & medical management. Therapeutic hypothermia was initiated in the ED prior to going to the cath lab. The patient received 1L of iced saline. Patient did receive bolus bicarb during heart cath.  SUBJECTIVE:  Remains unresponsive off propofol.  VITAL SIGNS: BP 106/73   Pulse 60   Temp 98.2 F (36.8 C) (Core (Comment))   Resp 16   Ht 5\' 7"  (1.702 m)   Wt 105.6 kg (232 lb 13.2 oz)   SpO2 100%   BMI 36.47 kg/m   HEMODYNAMICS: CVP:  [2 mmHg-5 mmHg] 5 mmHg  VENTILATOR SETTINGS: Vent Mode: PRVC FiO2 (%):  [40 %] 40 % Set Rate:  [16 bmp] 16 bmp Vt Set:  [530 mL] 530 mL PEEP:  [5 cmH20] 5 cmH20 Plateau Pressure:  [15 cmH20-18 cmH20] 15 cmH20  INTAKE / OUTPUT: I/O last 3 completed shifts: In: 3865.4 [I.V.:2235.4; NG/GT:110; IV Piggyback:1520] Out: 1755 [Urine:1755]  PHYSICAL EXAMINATION: General:  Unresponsive on MV Integument:  Warm & dry. No rash on exposed skin. No bruising.  Lymphatics:  No  appreciated cervical or supraclavicular lymphadenoapthy. HEENT:  No scleral injection or icterus. Endotracheal tube in place.  Cardiovascular:  Regular rate. No edema. No appreciable JVD.  Pulmonary:  Good aeration & clear to auscultation bilaterally. Symmetric chest wall rise on ventilator. Abdomen: Soft. Normal bowel sounds. Nondistended.  Musculoskeletal:  Normal bulk. No joint deformity or effusion appreciated. Neurological:  No withdrawal to pain in extremities. Pupils 3mm and equal. Intermittent myoclonic jerking  LABS:  BMET  Recent Labs Lab 03/22/16 0417 03/23/16 0405 03/24/16 0340  NA 139 140 140  K 3.7 3.6 4.5  CL 108 109 111  CO2 21* 22 24  BUN 30* 20 15  CREATININE 1.48* 1.21 1.03  GLUCOSE 133* 125* 124*   Electrolytes  Recent Labs Lab 03/20/16 0435  03/22/16 0417 03/23/16 0405 03/24/16 0340  CALCIUM 8.1*  < > 8.1* 8.2* 8.1*  MG 1.9  --   --  1.7 2.2  PHOS 3.3  --   --  3.6 3.6  < > = values in this interval not displayed.  CBC  Recent Labs Lab 03/22/16 0417 03/23/16 0405 03/24/16 0340  WBC 11.2* 8.7 6.8  HGB 12.9* 12.4* 11.7*  HCT 40.3 39.1 37.8*  PLT 184 158 PENDING   Coag's  Recent Labs Lab 03/22/16 0417  03/22/16 2340 03/23/16 0405 03/24/16 0340  APTT 36  < > 107* 77* 73*  INR 1.57  --   --  2.27  2.31  < > = values in this interval not displayed.  Sepsis Markers  Recent Labs Lab 03/31/2016 1400 03/28/2016 2003 03/20/16 0200  LATICACIDVEN 2.2* 2.0* 2.3*   ABG  Recent Labs Lab 03/31/2016 1608 03/23/16 0436 03/24/16 0501  PHART 7.397 7.474* 7.391  PCO2ART 30.4* 27.9* 36.3  PO2ART 195.0* 136* 141*   Liver Enzymes  Recent Labs Lab 03/22/16 0417 03/23/16 0405 03/24/16 0340  AST 35 35 28  ALT 36 31 23  ALKPHOS 57 56 57  BILITOT 0.7 0.7 0.7  ALBUMIN 2.6* 2.5* 2.4*   Cardiac Enzymes  Recent Labs Lab 03/22/2016 1345 03/18/2016 2003 03/20/16 0200  TROPONINI 0.35* 0.26* 0.21*   Glucose  Recent Labs Lab 03/23/16 0742  03/23/16 1225 03/23/16 1626 03/23/16 1943 03/23/16 2314 03/24/16 0334  GLUCAP 123* 136* 128* 113* 109* 135*   Imaging Dg Chest Port 1 View  Result Date: 03/24/2016 CLINICAL DATA:  Shortness of breath. EXAM: PORTABLE CHEST 1 VIEW COMPARISON:  03/23/2016. FINDINGS: Endotracheal tube, NG tube, right IJ line in stable position. AICD in stable position. Cardiomegaly with bilateral pulmonary vascular prominence interstitial prominence with bilateral pleural effusions again noted. Findings consistent congestive heart failure. Slight interim worsening. No pneumothorax . IMPRESSION: 1. Lines and tubes in stable position. 2. AICD in stable position. Congestive heart failure with bilateral pulmonary edema and bilateral pleural effusions. Slight worsening from prior exam. Electronically Signed   By: Maisie Fus  Register   On: 03/24/2016 06:53   STUDIES:  PFT 12/12/15: FVC 3.06 L (68%) FEV1 2.11 L (61%) FEV1/FVC 0.69 positive bronchodilator response TLC 4.61 L (72%) RV 77% ERV 78% DLCO uncorrected 72% TTE 02/11/16: LV severely dilated. EF 15-20% w/ diffuse hypokinesis. Grade 3 diastolic dysfunction. RV mildly dilated with normal systolic function. Moderate mitral regurgitation. No aortic stenosis or regurgitation. Port CXR 8/18: Endotracheal tube in acceptable position. Suggestion of cardiomegaly. Left lower lobe opacity as well as right midlung opacity with air bronchograms suggesting consolidation. EEG 8/18 >> no over seizure activity, generalized low voltage and irregular delta activity Head CT scan 8/18 >> poor gray-white differentiation throughout hemispheres, R occipital hypo-attenuation could represent chronic or sub-acute infarct L heart cath 8/18 >> no significant change from prior study 11/2015  MICROBIOLOGY: MRSA PCR 8/18 >> Tracheal Asp Ctx 8/18 >> Blood Ctx x2 8/18 >>  ANTIBIOTICS: Unasyn 8/18 >>  SIGNIFICANT EVENTS: 8/18 - Admit post arrest w/ cooling initiated & LHC  LINES/TUBES: OETT 7.0  8/18>>> Foley 8/18>>> R IJ TLC 8/18>>> PIV x2>>>  DISCUSSION:  55 year old male with known DM, HTN, DLD, & Systolic CHF (EF 37-16%). VF arrest w/ 12 min until ROSC. Initiated therapeutic hypothermia. Neurological prognosis poor based on myoclonus and abnormal CT head  ASSESSMENT / PLAN:  CARDIOVASCULAR A:  S/P VF Arrest - 12 min downtime. Witnessed & CPR initiated by son. Shock - Likely due to medication/sedation +/- cardiogenic H/O NSTEMI/CAD H/O Systolic CHF H/O AICD Placement H/O HTN & DLD  P:  Vitals per unit protocol. Continuous telemetry monitoring. Amiodarone, digoxin. Systemic anticoagulation>>>plavix, angiomax drip. Levophed for BP support specially that we will be increasing propofol.  NEUROLOGIC A:   Sedation on Ventilator Acute Encephalopathy - Likely from anoxia. No reported purposeful movements post arrest or currently off sedation Myoclonus, due to anoxic injury. Unlikely to be seizures P:   RASS goal: 0. 33C Hypothermia Protocol completed. Appreciate Neurology's assistance. keppra + depakote ordered. EEG noted. D/C propofol and allow for current discharges, sedation only if twitching affects family.  PULMONARY A: Acute Hypoxic Respiratory Failure - Post arrest. Probable Aspiration - R Lung on CXR. Moderate COPD - Based on pre-bronchod spiro May 2017. Mild Restrictive Lung Disease - Based on lung vol May 2017. Likely due to cardiomyopathy.  P:   Continue full vent support. Hold weaning until neuro status is addressed. Intermittent Port CXR & ABG.  RENAL A:   Acute Renal Failure Lactic Acidosis, improved  P:   Trending UOP with Foley Monitoring electrolytes & renal function, serial BMP Replacing electrolytes as indicated  GASTROINTESTINAL A:   Transaminitis - Mild. Likely due to shock.  P:   Plan withdrawal on Friday so did not start TF but now that family is not sure will start TF per nutrition. Pepcid IV q12hr.  HEMATOLOGIC A:    Coagulopathy on Systemic Anticoagulation - Coumadin as outpatient. INR 1.9 on arrival. Leukocytosis - Likely reactive from arrest.  P:  Trending cell counts w/ CBC. SCDs. Anticoagulation per cardiology.  INFECTIOUS A:   Probable Aspiration Pneumonia vs Pneumonitis  P:   Empiric Unasyn Day #6 Tracheal Asp Ctx  ENDOCRINE A:   H/O DM Type 2    P:   Checking Hgb A1c. SSI per Moderate algorithm. Accu-Checks q4hr. Holding home Lantus & Metformin.  FAMILY  - Updates: No family bedside.  - Inter-disciplinary family meet or Palliative Care meeting due by:  8/25  The patient is critically ill with multiple organ systems failure and requires high complexity decision making for assessment and support, frequent evaluation and titration of therapies, application of advanced monitoring technologies and extensive interpretation of multiple databases.   Critical Care Time devoted to patient care services described in this note is  45  Minutes. This time reflects time of care of this signee Dr Koren Bound. This critical care time does not reflect procedure time, or teaching time or supervisory time of PA/NP/Med student/Med Resident etc but could involve care discussion time.  Alyson Reedy, M.D. [facility] Pulmonary/Critical Care Medicine. Pager: [phone]. After hours pager: [phone].

## 2016-03-24 NOTE — Progress Notes (Signed)
ANTICOAGULATION CONSULT NOTE - Follow Up Consult  Pharmacy Consult for Bivalirudin Indication: aflutter   Allergies  Allergen Reactions  . Heparin Other (See Comments)    Will not use pork-derived products due to religious beliefs   . Lovenox [Enoxaparin Sodium] Other (See Comments)    Will not use pork-derived products due to religious beliefs   . Pork-Derived Products Other (See Comments)    Does not eat pork or pork derived products d/t religious reasons    Patient Measurements: Height: 5\' 7"  (170.2 cm) Weight: 232 lb 13.2 oz (105.6 kg) IBW/kg (Calculated) : 66.1  Vital Signs: Temp: 98.2 F (36.8 C) (08/23 0900) Temp Source: Core (Comment) (08/23 0900) BP: 123/82 (08/23 0900) Pulse Rate: 60 (08/23 0900)  Labs:  Recent Labs  03/22/16 0417  03/22/16 2340 03/23/16 0405 03/24/16 0340  HGB 12.9*  --   --  12.4* 11.7*  HCT 40.3  --   --  39.1 37.8*  PLT 184  --   --  158 PENDING  APTT 36  < > 107* 77* 73*  LABPROT 18.9*  --   --  25.4* 25.8*  INR 1.57  --   --  2.27 2.31  CREATININE 1.48*  --   --  1.21 1.03  < > = values in this interval not displayed.  Estimated Creatinine Clearance: 95 mL/min (by C-G formula based on SCr of 1.03 mg/dL).   Medications:  Bivalirudin @ 0.05mg /kg/hr  Assessment: 55yom on coumadin pta for aflutter, admitted s/p cardiac arrest. He has now completed the hypothermia protocol. Coumadin held and bivalirudin bridge started 8/21. APTT is therapeutic at 73. Hgb stable, platelets pending. No bleeding.  Goal of Therapy:  aPTT 50-90 seconds Monitor platelets by anticoagulation protocol: Yes   Plan:  1) Continue bivalirudin @ 0.05mg /kg/hr 2) Daily APTT and CBC  Fredrik Rigger 03/24/2016,11:11 AM

## 2016-03-24 NOTE — Progress Notes (Signed)
LTM discontinued; notified Dr Loel Dubonnet. Small amount of skin irritation in the frontal leads, but no breakdown noted.

## 2016-03-25 ENCOUNTER — Inpatient Hospital Stay (HOSPITAL_COMMUNITY): Payer: Medicaid Other

## 2016-03-25 DIAGNOSIS — J9600 Acute respiratory failure, unspecified whether with hypoxia or hypercapnia: Secondary | ICD-10-CM

## 2016-03-25 LAB — BLOOD GAS, ARTERIAL
Acid-base deficit: 1.9 mmol/L (ref 0.0–2.0)
Bicarbonate: 22 mEq/L (ref 20.0–24.0)
Drawn by: 270271
FIO2: 40
LHR: 16 {breaths}/min
MECHVT: 530 mL
O2 Saturation: 97 %
PATIENT TEMPERATURE: 98.6
PCO2 ART: 34.8 mmHg — AB (ref 35.0–45.0)
PEEP: 5 cmH2O
PO2 ART: 102 mmHg — AB (ref 80.0–100.0)
TCO2: 23 mmol/L (ref 0–100)
pH, Arterial: 7.417 (ref 7.350–7.450)

## 2016-03-25 LAB — CBC
HCT: 39 % (ref 39.0–52.0)
Hemoglobin: 12.1 g/dL — ABNORMAL LOW (ref 13.0–17.0)
MCH: 22.2 pg — AB (ref 26.0–34.0)
MCHC: 31 g/dL (ref 30.0–36.0)
MCV: 71.6 fL — ABNORMAL LOW (ref 78.0–100.0)
PLATELETS: 153 10*3/uL (ref 150–400)
RBC: 5.45 MIL/uL (ref 4.22–5.81)
RDW: 21.8 % — ABNORMAL HIGH (ref 11.5–15.5)
WBC: 6.9 10*3/uL (ref 4.0–10.5)

## 2016-03-25 LAB — BASIC METABOLIC PANEL
Anion gap: 5 (ref 5–15)
BUN: 20 mg/dL (ref 6–20)
CO2: 24 mmol/L (ref 22–32)
CREATININE: 0.98 mg/dL (ref 0.61–1.24)
Calcium: 8.4 mg/dL — ABNORMAL LOW (ref 8.9–10.3)
Chloride: 111 mmol/L (ref 101–111)
GFR calc Af Amer: >60 mL/min (ref >60)
Glucose, Bld: 138 mg/dL — ABNORMAL HIGH (ref 65–99)
Potassium: 4.3 mmol/L (ref 3.5–5.1)
SODIUM: 140 mmol/L (ref 135–145)

## 2016-03-25 LAB — MAGNESIUM: MAGNESIUM: 1.9 mg/dL (ref 1.7–2.4)

## 2016-03-25 LAB — GLUCOSE, CAPILLARY
GLUCOSE-CAPILLARY: 144 mg/dL — AB (ref 65–99)
GLUCOSE-CAPILLARY: 160 mg/dL — AB (ref 65–99)
GLUCOSE-CAPILLARY: 156 mg/dL — AB (ref 65–99)
Glucose-Capillary: 145 mg/dL — ABNORMAL HIGH (ref 65–99)
Glucose-Capillary: 161 mg/dL — ABNORMAL HIGH (ref 65–99)
Glucose-Capillary: 190 mg/dL — ABNORMAL HIGH (ref 65–99)

## 2016-03-25 LAB — HEPATIC FUNCTION PANEL
ALK PHOS: 60 U/L (ref 38–126)
ALT: 20 U/L (ref 17–63)
AST: 24 U/L (ref 15–41)
Albumin: 2.4 g/dL — ABNORMAL LOW (ref 3.5–5.0)
BILIRUBIN DIRECT: 0.2 mg/dL (ref 0.1–0.5)
BILIRUBIN INDIRECT: 0.5 mg/dL (ref 0.3–0.9)
Total Bilirubin: 0.7 mg/dL (ref 0.3–1.2)
Total Protein: 5.5 g/dL — ABNORMAL LOW (ref 6.5–8.1)

## 2016-03-25 LAB — PROTIME-INR
INR: 1.99
Prothrombin Time: 22.9 seconds — ABNORMAL HIGH (ref 11.4–15.2)

## 2016-03-25 LAB — APTT: aPTT: 65 seconds — ABNORMAL HIGH (ref 24–36)

## 2016-03-25 LAB — PHOSPHORUS: Phosphorus: 3.5 mg/dL (ref 2.5–4.6)

## 2016-03-25 NOTE — Progress Notes (Signed)
Patient ID: Benjamin [name], male   DOB: 1961-05-24, 55 y.o.   MRN: [id number]   ADVANCED HF PROGRESS NOTE  Yesterday norepi weaned off and sedation stopped.  Ongoing seizure activity. Remains unresponsive. Family not in agreement about whether to withdraw or not.     Scheduled Meds: . ampicillin-sulbactam (UNASYN) IV  3 g Intravenous Q8H  . antiseptic oral rinse  7 mL Mouth Rinse 10 times per day  . chlorhexidine gluconate (SAGE KIT)  15 mL Mouth Rinse BID  . clopidogrel  75 mg Per Tube Daily  . digoxin  0.0625 mg Oral Daily  . famotidine (PEPCID) IV  20 mg Intravenous Q12H  . feeding supplement (PRO-STAT SUGAR FREE 64)  30 mL Per Tube Daily  . feeding supplement (VITAL HIGH PROTEIN)  1,000 mL Per Tube Q24H  . insulin aspart  0-15 Units Subcutaneous Q4H  . insulin glargine  10 Units Subcutaneous Daily  . levETIRAcetam  1,000 mg Intravenous BID  . rosuvastatin  20 mg Per Tube q1800  . sodium chloride flush  10-40 mL Intracatheter Q12H  . sodium chloride flush  3 mL Intravenous Q12H  . valproate sodium  500 mg Intravenous Q8H   Continuous Infusions: . amiodarone 30 mg/hr (03/25/16 0258)  . bivalirudin (ANGIOMAX) infusion 0.5 mg/mL (Non-ACS indications) 0.05 mg/kg/hr (03/24/16 2008)  . norepinephrine (LEVOPHED) Adult infusion Stopped (03/24/16 1150)  . propofol (DIPRIVAN) infusion Stopped (03/24/16 0754)   PRN Meds:.sodium chloride, acetaminophen, fentaNYL (SUBLIMAZE) injection, hydrALAZINE, labetalol, midazolam, ondansetron (ZOFRAN) IV, sodium chloride flush, sodium chloride flush     Vitals:   03/25/16 0351 03/25/16 0400 03/25/16 0500 03/25/16 0600  BP: 131/88 130/80 122/74 120/77  Pulse: 63 63 61 60  Resp: (!) 22 (!) 22 (!) 25 (!) 24  Temp: (!) 100.4 F (38 C)   100 F (37.8 C)  TempSrc: Core (Comment)   Core (Comment)  SpO2: 100% 100% 100% 100%  Weight:      Height:        Intake/Output Summary (Last 24 hours) at 03/25/16 0729 Last data filed at 03/25/16 0600  Gross  per 24 hour  Intake          2884.59 ml  Output             1017 ml  Net          1867.59 ml    LABS: Basic Metabolic Panel:  Recent Labs  03/24/16 0340 03/25/16 0330  NA 140 140  K 4.5 4.3  CL 111 111  CO2 24 24  GLUCOSE 124* 138*  BUN 15 20  CREATININE 1.03 0.98  CALCIUM 8.1* 8.4*  MG 2.2 1.9  PHOS 3.6 3.5   Liver Function Tests:  Recent Labs  03/24/16 0340 03/25/16 0330  AST 28 24  ALT 23 20  ALKPHOS 57 60  BILITOT 0.7 0.7  PROT 5.3* 5.5*  ALBUMIN 2.4* 2.4*   No results for input(s): LIPASE, AMYLASE in the last 72 hours. CBC:  Recent Labs  03/24/16 0340 03/25/16 0330  WBC 6.8 6.9  HGB 11.7* 12.1*  HCT 37.8* 39.0  MCV 71.9* 71.6*  PLT 154 153   Cardiac Enzymes: No results for input(s): CKTOTAL, CKMB, CKMBINDEX, TROPONINI in the last 72 hours. BNP: Invalid input(s): POCBNP D-Dimer: No results for input(s): DDIMER in the last 72 hours. Hemoglobin A1C: No results for input(s): HGBA1C in the last 72 hours. Fasting Lipid Panel:  Recent Labs  03/24/16 0340  TRIG 209*   Thyroid  Function Tests: No results for input(s): TSH, T4TOTAL, T3FREE, THYROIDAB in the last 72 hours.  Invalid input(s): FREET3 Anemia Panel: No results for input(s): VITAMINB12, FOLATE, FERRITIN, TIBC, IRON, RETICCTPCT in the last 72 hours.  RADIOLOGY: Ct Head Wo Contrast  Result Date: 03/21/2016 CLINICAL DATA:  Ventricular fibrillation arrest at home. Post anoxic myoclonus. No meaningful response to external stimuli. EXAM: CT HEAD WITHOUT CONTRAST TECHNIQUE: Contiguous axial images were obtained from the base of the skull through the vertex without intravenous contrast. COMPARISON:  Portable CT scan 03/11/2016. FINDINGS: Brain: Muted gray-white differentiation throughout the cerebral hemispheres, without frank reversal of the normal attenuation relationship between gray and white matter, nevertheless concerning for early hypoxic ischemic injury. Chronic RIGHT occipital infarct,  well-demarcated, and stable. No secondary signs of increased intracranial pressure. Premature for age atrophy. No hemorrhage, mass lesion, or extra-axial fluid. Vascular: Vascular calcification.  No hyperdense vessel proximally. Skull: Unremarkable. Asymmetric thickness and hyperattenuation of the scalp soft tissues over the LEFT convexity, suggesting scalp hematoma, new/increased from portable CT. Sinuses/Orbits: Layering fluid in the sphenoid and mastoids reflect recumbency and intubation. Other: None. IMPRESSION: Muted gray-white differentiation similar to portable CT scan performed on 08/18. Early hypoxic ischemic injury is not excluded. No signs of large vessel occlusion, increased intracranial pressure, or intracranial hemorrhage. Continued surveillance is warranted. Electronically Signed   By: Staci Righter M.D.   On: 03/21/2016 09:44   Dg Chest Port 1 View  Result Date: 03/25/2016 CLINICAL DATA:  55 y/o  M; endotracheal tube. EXAM: PORTABLE CHEST 1 VIEW COMPARISON:  Chest radiograph 03/24/2016. FINDINGS: Enteric tube tip below the field of view and abdomen. Right central venous catheter tip projects over lower SVC. Three lead AICD noted. Endotracheal tube in stable position. The left costophrenic angles excluded from field of view. Diffuse airspace opacities with basilar predominance are mildly increased probably representing worsening edema, possibly pneumonia. Stable small right effusion. IMPRESSION: Mild increase in airspace opacities probably represents worsening edema, possibly pneumonia. Small stable right effusion. Electronically Signed   By: Kristine Garbe M.D.   On: 03/25/2016 06:24   Dg Chest Port 1 View  Result Date: 03/24/2016 CLINICAL DATA:  Shortness of breath. EXAM: PORTABLE CHEST 1 VIEW COMPARISON:  03/23/2016. FINDINGS: Endotracheal tube, NG tube, right IJ line in stable position. AICD in stable position. Cardiomegaly with bilateral pulmonary vascular prominence  interstitial prominence with bilateral pleural effusions again noted. Findings consistent congestive heart failure. Slight interim worsening. No pneumothorax . IMPRESSION: 1. Lines and tubes in stable position. 2. AICD in stable position. Congestive heart failure with bilateral pulmonary edema and bilateral pleural effusions. Slight worsening from prior exam. Electronically Signed   By: Marcello Moores  Register   On: 03/24/2016 06:53   Dg Chest Port 1 View  Result Date: 03/23/2016 CLINICAL DATA:  Shortness of breath. EXAM: PORTABLE CHEST 1 VIEW COMPARISON:  03/22/2016. FINDINGS: Endotracheal tube, NG tube, right IJ line stable position. AICD in stable position. Cardiomegaly with pulmonary vascular prominence and bilateral interstitial prominence bilateral pleural effusions. Findings consistent with congestive heart failure no pneumothorax. IMPRESSION: 1.  Lines and tubes in stable position. 2. AICD in stable position. Cardiomegaly with pulmonary vascular prominence and bilateral interstitial prominence bilateral pleural effusions consistent congestive heart failure with pulmonary interstitial edema. Similar findings noted on prior exam Electronically Signed   By: Talpa Bend   On: 03/23/2016 07:00   Dg Chest Port 1 View  Result Date: 03/22/2016 CLINICAL DATA:  Acute respiratory failure EXAM: PORTABLE CHEST 1 VIEW COMPARISON:  Chest radiograph 03/21/2016 FINDINGS: Endotracheal tube tip is at the level of the clavicular heads. A right IJ central venous catheter overlies the lower SVC. Unchanged appearance of left chest wall pacemaker/AICD. Aeration of the lungs has improved. There is persistent pulmonary edema, decreased from the prior study. Small left pleural effusion. No pneumothorax. Persistent cardiomegaly. IMPRESSION: Mildly improved lung inflation with decreased pulmonary edema. Electronically Signed   By: Ulyses Jarred M.D.   On: 03/22/2016 05:58   Dg Chest Port 1 View  Result Date:  03/21/2016 CLINICAL DATA:  Acute respiratory failure. EXAM: PORTABLE CHEST 1 VIEW COMPARISON:  03/30/2016. FINDINGS: The heart remains enlarged. BILATERAL pulmonary opacities favored to represent pulmonary edema, worse from priors. Endotracheal tube tip satisfactory position, 3.9 cm above carina. Dual lead pacer unchanged. Central venous catheter tip proximal RIGHT atrium from RIGHT IJ approach. IMPRESSION: Worsening aeration.  Cardiomegaly with pulmonary edema. Electronically Signed   By: Staci Righter M.D.   On: 03/21/2016 07:43   Dg Chest Port 1 View  Result Date: 03/16/2016 CLINICAL DATA:  Central catheter placement EXAM: PORTABLE CHEST 1 VIEW COMPARISON:  Study obtained earlier in the day FINDINGS: Central catheter tip is in the superior vena cava. Nasogastric tube tip and side port are below the diaphragm. Endotracheal tube tip is 5.4 cm above the carina. Pacemaker leads are attached to the right atrium, right ventricle, and left ventricle, stable. No pneumothorax. There is atelectatic change in the left base. Lungs elsewhere clear. There is cardiomegaly. The pulmonary vascularity is within normal limits. No adenopathy. IMPRESSION: Tube and catheter positions as described without pneumothorax. Stable cardiomegaly. Left base atelectasis, unchanged. No new opacity. Electronically Signed   By: Lowella Grip III M.D.   On: 03/28/2016 14:10   Dg Chest Port 1 View  Result Date: 03/02/2016 CLINICAL DATA:  Evaluate endotracheal tube placement. EXAM: PORTABLE CHEST 1 VIEW COMPARISON:  01/12/2016 FINDINGS: Endotracheal tube is 4.5 cm above the carina. The cardiac silhouette is enlarged. There is a left-sided biventricular ICD. Haziness and increased densities in the perihilar regions are suggestive for vascular congestion and edema. Negative for a pneumothorax. IMPRESSION: Endotracheal tube is appropriately positioned. Cardiomegaly with central vascular congestion and mild edema. Electronically Signed   By:  Markus Daft M.D.   On: 03/17/2016 10:30   Ct Portable Head W/o Cm  Result Date: 03/30/2016 CLINICAL DATA:  Status post cardiac arrest today for 8 minutes. EXAM: CT HEAD WITHOUT CONTRAST TECHNIQUE: Contiguous axial images were obtained from the base of the skull through the vertex without intravenous contrast. COMPARISON:  05/21/2008. FINDINGS: Study was performed with a portable apparatus. Image quality is reduced. Brain: There is a area of 27 x 16 mm hypoattenuation in the RIGHT occipital lobe, uncertain significance and duration. This was not present in 2009, but image quality is insufficient to distinguish between an acute versus chronic infarct. Hypoattenuation due to a mass is less likely. Poor gray-white differentiation throughout the cerebral hemispheres. Anoxic- ischemic injury is not excluded. No hemorrhage, hydrocephalus, or extra-axial fluid. Mild atrophy, premature for age. Vascular: No hyperdense vessel.  Carotid siphon calcification. Skull: Negative Sinuses/Orbits: Negative Other: None. IMPRESSION: Poor gray-white differentiation throughout the cerebral hemispheres. Anoxic ischemic injury is not excluded. Superficial RIGHT occipital lobe area of hypoattenuation, approximately the 3 x 1.5 cm, could represent an acute or chronic infarct. Continued surveillance warranted. No intracranial hemorrhage or large vessel occlusion. Electronically Signed   By: Staci Righter M.D.   On: 03/21/2016 20:16    PHYSICAL EXAM  CVP 6 General: Intubated Neck: No JVD, no thyromegaly or thyroid nodule.Lungs: Coarse throughout.  CV: Lateral PMI.  Heart regular S1/S2, no S3/S4, no murmur.  No peripheral edema.   Abdomen: Soft, nontender, no hepatosplenomegaly, no distention.  Neurologic: Intubated, not responsive. Extremities: No clubbing or cyanosis. R and LLE SCDs.   TELEMETRY: Reviewed telemetry pt in a-paced, BiV paced  ASSESSMENT AND PLAN: 55 yo with history of systolic HF due to ischemic cardiomyopathy  (EF 20-25%) S/P [employer] bi-V ICD implanted October 2016, PAFL, hypertension, diabetes mellitus, hyperlipidemia who presents with VT/VF arrest at home.  1. Cardiac arrest: VF arrest at home.  Coronary angiography this admission showed stable coronary anatomy compared to post-PCI in 4/17.  Suspect primary arrhythmic arrest. Rewarmed. Rhythm now stable. Will stop amio  2. Chronic systolic CHF: EF 24-19%, ischemic cardiomyopathy.   - Off diuretics & pressors 3. CAD: Stable anatomy on angiography. Continue Plavix, statin.  4. Possible aspiration PNA: On Unasyn per CCM.  5. Neuro: He has been rewarmed and sedation is off.  No meaningful response. Has started to have post-anoxic myoclonus.  EEG abnormal . Possible MRI today. reconery seems doubtful.  6. DNR.      Amy Clegg NP-C  03/25/2016 7:29 AM  Patient seen and examined with Darrick Grinder, NP. We discussed all aspects of the encounter. I agree with the assessment and plan as stated above.   He has severe anoxic brain injury remains unresponsive off all sedation. CV status stable off pressors. Rhythm stable. Family not wanting to withdraw support at this time but also refused trach/PEG earlier in the week. We will need Palliative Care team involvement and perhaps Ethics team. Will stop amio.   Critical Care Time devoted to patient care services described in this note is 35 Minutes.  Bensimhon, Daniel,MD 9:12 PM

## 2016-03-25 NOTE — Progress Notes (Signed)
Subjective:  There've been no changes overnight. The patient continues to have some myoclonic movements. The sedation has been discontinued. The neurologic exam remains the same.  Exam: Vitals:   03/25/16 0900 03/25/16 1000  BP: 106/67 97/63  Pulse: 60 60  Resp: (!) 24 17  Temp:      HEENT-  Normocephalic, no lesions, without obvious abnormality.  Normal external eye and conjunctiva.  Normal TM's bilaterally.  Normal auditory canals and external ears. Normal external nose, mucus membranes and septum.  Normal pharynx. Cardiovascular- regular rate and rhythm, S1, S2 normal, no murmur, click, rub or gallop, pulses palpable throughout   Lungs- chest clear, no wheezing, rales, normal symmetric air entry, Heart exam - S1, S2 normal, no murmur, no gallop, rate regular Abdomen- soft, non-tender; bowel sounds normal; no masses,  no organomegaly Extremities- less then 2 second capillary refill   Gen: In bed, NAD MS: No response to stimuli. CN: Pupils are sluggish and reactive. Corneal reflexes are present. Cough reflex is present. Slight respiratory effort on vent. Motor: No response Sensory: No response   Pertinent Labs/Diagnostics: Reviewed.    Impression:   There has been no change in the neurological exam over the course of several days. The presentation is highly consistent with an anoxic brain injury. There is clinical evidence of myoclonus. The prognosis is extremely grim. The family is considering the option of withdrawing support. However, they have not reached a decision at this point.   Recommendations:  1. No new recommendations at this time. An MRI might help demonstrate the presence of anoxic brain injury. Perhaps this might help the patient's family in making a decision regarding determination of support.  2. There are no new neurological issues at this point. Neurology will sign off. Please reconsult with any new issues.   Benjamin [name] A. Hilda Blades,  M.D. Neurohospitalist Phone: [phone]   03/25/2016, 11:18 AM

## 2016-03-25 NOTE — Progress Notes (Signed)
ANTICOAGULATION CONSULT NOTE - Follow Up Consult  Pharmacy Consult for Bivalirudin Indication: aflutter   Allergies  Allergen Reactions  . Heparin Other (See Comments)    Will not use pork-derived products due to religious beliefs   . Lovenox [Enoxaparin Sodium] Other (See Comments)    Will not use pork-derived products due to religious beliefs   . Pork-Derived Products Other (See Comments)    Does not eat pork or pork derived products d/t religious reasons    Patient Measurements: Height: 5\' 7"  (170.2 cm) Weight: 233 lb 11 oz (106 kg) IBW/kg (Calculated) : 66.1  Vital Signs: Temp: 100 F (37.8 C) (08/24 0600) Temp Source: Core (Comment) (08/24 0600) BP: 120/74 (08/24 0700) Pulse Rate: 59 (08/24 0700)  Labs:  Recent Labs  03/23/16 0405 03/24/16 0340 03/25/16 0330  HGB 12.4* 11.7* 12.1*  HCT 39.1 37.8* 39.0  PLT 158 154 153  APTT 77* 73* 65*  LABPROT 25.4* 25.8* 22.9*  INR 2.27 2.31 1.99  CREATININE 1.21 1.03 0.98    Estimated Creatinine Clearance: 100.1 mL/min (by C-G formula based on SCr of 0.98 mg/dL).   Medications:  Bivalirudin @ 0.05mg /kg/hr  Assessment: 55yom on coumadin pta for aflutter, admitted s/p cardiac arrest. He has now completed the hypothermia protocol. Coumadin on hold and bivalirudin bridge started 8/21. APTT is therapeutic at 65. Hgb stable, platelets stable. No bleeding.  Goal of Therapy:  aPTT 50-90 seconds Monitor platelets by anticoagulation protocol: Yes   Plan:  1) Continue bivalirudin @ 0.05mg /kg/hr 2) Daily APTT and CBC  Sheppard Coil PharmD., BCPS Clinical Pharmacist Pager [phone] 03/25/2016 7:35 AM

## 2016-03-25 NOTE — Progress Notes (Signed)
Per night shift charge RN, pt's family found in pt's room smoking a substance. [facility], secuirty, and fire department were involved. PT's family informed that a strict 2 visitor limit will be followed. RNs will continue to monitor and advise about visitation policy/hospital rules of no smoking.

## 2016-03-25 NOTE — Progress Notes (Signed)
PULMONARY / CRITICAL CARE MEDICINE   Name: Benjamin [name] MRN: [id number] DOB: 12/23/1960    ADMISSION DATE:  03/24/2016 CONSULTATION DATE:  03/28/2016  REFERRING MD:  Loren Raceravid Yelverton, M.D. / EDP  CHIEF COMPLAINT:  Post Arrest  HISTORY OF PRESENT ILLNESS:  Per records patient was a witnessed arrest with CPR initiated by his son. On arrival patient was in PEA. Patient received approximately 12 minutes of CPR total as well as bolus epinephrine. Patient lost his pulse in the midst of transport to the emergency department but regained it prior to arrival. Rhythm was paced upon arrival in the emergency department. Endotracheal tube placed by EMS. Family confirm the patient did not have any purposeful movement or open his eyes at all during his resuscitation at home. Additionally his son noted that his AICD did fire as he was doing CPR. The wife found him laying on the bed, unresponsive, with abnormal respirations. She promptly called 911 & her son started CPR. PCCM was consulted for vent & medical management. Therapeutic hypothermia was initiated in the ED prior to going to the cath lab. The patient received 1L of iced saline. Patient did receive bolus bicarb during heart cath.  SUBJECTIVE:  Remains unresponsive off sedation Low gr temp  VITAL SIGNS: BP 116/75   Pulse 60   Temp 100 F (37.8 C) (Core (Comment))   Resp (!) 23   Ht 5\' 7"  (1.702 m)   Wt 106 kg (233 lb 11 oz)   SpO2 100%   BMI 36.60 kg/m   HEMODYNAMICS: CVP:  [6 mmHg-8 mmHg] 6 mmHg  VENTILATOR SETTINGS: Vent Mode: PRVC FiO2 (%):  [40 %] 40 % Set Rate:  [16 bmp] 16 bmp Vt Set:  [530 mL] 530 mL PEEP:  [5 cmH20] 5 cmH20 Plateau Pressure:  [17 cmH20-20 cmH20] 17 cmH20  INTAKE / OUTPUT: I/O last 3 completed shifts: In: 4234.9 [I.V.:1683.9; NG/GT:1196; IV Piggyback:1355] Out: 1477 [Urine:1221; Emesis/NG output:256]  PHYSICAL EXAMINATION: General:  Unresponsive on MV Integument:  Warm & dry. No rash on exposed skin. No  bruising.  Lymphatics:  No cervical or supraclavicular lymphadenoapthy. HEENT:  No scleral injection or icterus. Endotracheal tube in place.  Cardiovascular:  Regular rate. No edema. No appreciable JVD.  Pulmonary:  Good aeration & clear to auscultation bilaterally. Symmetric chest wall rise on ventilator. Abdomen: Soft. Normal bowel sounds. Nondistended.  Musculoskeletal:  Normal bulk. No joint deformity or effusion appreciated. Neurological:  No withdrawal to pain in extremities. Pupils 3mm and equal. Intermittent myoclonic twitching of eyelids , worse on exposure to light  LABS:  BMET  Recent Labs Lab 03/23/16 0405 03/24/16 0340 03/25/16 0330  NA 140 140 140  K 3.6 4.5 4.3  CL 109 111 111  CO2 22 24 24   BUN 20 15 20   CREATININE 1.21 1.03 0.98  GLUCOSE 125* 124* 138*   Electrolytes  Recent Labs Lab 03/23/16 0405 03/24/16 0340 03/25/16 0330  CALCIUM 8.2* 8.1* 8.4*  MG 1.7 2.2 1.9  PHOS 3.6 3.6 3.5    CBC  Recent Labs Lab 03/23/16 0405 03/24/16 0340 03/25/16 0330  WBC 8.7 6.8 6.9  HGB 12.4* 11.7* 12.1*  HCT 39.1 37.8* 39.0  PLT 158 154 153   Coag's  Recent Labs Lab 03/23/16 0405 03/24/16 0340 03/25/16 0330  APTT 77* 73* 65*  INR 2.27 2.31 1.99    Sepsis Markers  Recent Labs Lab 03/08/2016 1400 03/10/2016 2003 03/20/16 0200  LATICACIDVEN 2.2* 2.0* 2.3*   ABG  Recent Labs  Lab 03/23/16 0436 03/24/16 0501 03/25/16 0400  PHART 7.474* 7.391 7.417  PCO2ART 27.9* 36.3 34.8*  PO2ART 136* 141* 102*   Liver Enzymes  Recent Labs Lab 03/23/16 0405 03/24/16 0340 03/25/16 0330  AST 35 28 24  ALT 31 23 20   ALKPHOS 56 57 60  BILITOT 0.7 0.7 0.7  ALBUMIN 2.5* 2.4* 2.4*   Cardiac Enzymes  Recent Labs Lab 2016-03-25 1345 03-25-16 2003 03/20/16 0200  TROPONINI 0.35* 0.26* 0.21*   Glucose  Recent Labs Lab 03/24/16 1110 03/24/16 1609 03/24/16 1958 03/24/16 2318 03/25/16 0355 03/25/16 0726  GLUCAP 114* 160* 146* 149* 145* 144*    Imaging Dg Chest Port 1 View  Result Date: 03/25/2016 CLINICAL DATA:  55 y/o  M; endotracheal tube. EXAM: PORTABLE CHEST 1 VIEW COMPARISON:  Chest radiograph 03/24/2016. FINDINGS: Enteric tube tip below the field of view and abdomen. Right central venous catheter tip projects over lower SVC. Three lead AICD noted. Endotracheal tube in stable position. The left costophrenic angles excluded from field of view. Diffuse airspace opacities with basilar predominance are mildly increased probably representing worsening edema, possibly pneumonia. Stable small right effusion. IMPRESSION: Mild increase in airspace opacities probably represents worsening edema, possibly pneumonia. Small stable right effusion. Electronically Signed   By: Mitzi Hansen M.D.   On: 03/25/2016 06:24   STUDIES:  PFT 12/12/15: FVC 3.06 L (68%) FEV1 2.11 L (61%) FEV1/FVC 0.69 positive bronchodilator response TLC 4.61 L (72%) RV 77% ERV 78% DLCO uncorrected 72% TTE 02/11/16: LV severely dilated. EF 15-20% w/ diffuse hypokinesis. Grade 3 diastolic dysfunction. RV mildly dilated with normal systolic function. Moderate mitral regurgitation. No aortic stenosis or regurgitation. Port CXR 8/18: Endotracheal tube in acceptable position. Suggestion of cardiomegaly. Left lower lobe opacity as well as right midlung opacity with air bronchograms suggesting consolidation. EEG 8/18 >> no over seizure activity, generalized low voltage and irregular delta activity Head CT scan 8/18 >> poor gray-white differentiation throughout hemispheres, R occipital hypo-attenuation could represent chronic or sub-acute infarct L heart cath 8/18 >> no significant change from prior study 11/2015  MICROBIOLOGY: MRSA PCR 8/18 >> Tracheal Asp Ctx 8/18 >> Blood Ctx x2 8/18 >>ng  ANTIBIOTICS: Unasyn 8/18 >>  SIGNIFICANT EVENTS: 8/18 - Admit post arrest w/ cooling initiated & LHC  LINES/TUBES: OETT 7.0 8/18>>> Foley 8/18>>> R IJ TLC 8/18>>> PIV  x2>>>  DISCUSSION:  55 year old male with known DM, HTN, DLD, & Systolic CHF (EF 79-72%). VF arrest w/ 12 min until ROSC. Initiated therapeutic hypothermia. Neurological prognosis poor based on myoclonus and abnormal CT head  ASSESSMENT / PLAN:  CARDIOVASCULAR A:  S/P VF Arrest - 12 min downtime. Witnessed & CPR initiated by son. Shock - Likely due to medication/sedation +/- cardiogenic H/O NSTEMI/CAD H/O Systolic CHF H/O AICD Placement H/O HTN & DLD  P:  Vitals per unit protocol. Continuous telemetry monitoring. Amiodarone, digoxin. Systemic anticoagulation>>>plavix, angiomax drip. Levophed off  NEUROLOGIC A:   Acute Encephalopathy - Likely from anoxia. No reported purposeful movements post arrest or currently off sedation -S/p 33C Hypothermia Protocol  Myoclonus, due to anoxic injury. Unlikely to be seizures P:   RASS goal: 0. Appreciate Neurology's assistance. keppra + depakote ordered. D/C propofol and allow for current discharges, sedation only if twitching affects family.  PULMONARY A: Acute Hypoxic Respiratory Failure - Post arrest. Probable Aspiration - R Lung on CXR. Moderate COPD - Based on pre-bronchod spiro May 2017. Mild Restrictive Lung Disease - Based on lung vol May 2017. Likely due  to cardiomyopathy.  P:   Continue full vent support.   RENAL A:   Acute Renal Failure Lactic Acidosis, improved  P:   Trending UOP with Foley Monitoring electrolytes & renal function, serial BMP Replacing electrolytes as indicated  GASTROINTESTINAL A:   Transaminitis - Mild. Likely due to shock.  P:   Ct TF per nutrition. Pepcid IV q12hr.  HEMATOLOGIC A:   Coagulopathy on Systemic Anticoagulation - Coumadin as outpatient. INR 1.9 on arrival. Leukocytosis - Likely reactive from arrest.  P:  Trending cell counts w/ CBC. SCDs. Anticoagulation per cardiology.  INFECTIOUS A:   Probable Aspiration Pneumonia vs Pneumonitis  P:   Empiric Unasyn Day  #7 Await Tracheal Asp Ctx  ENDOCRINE A:   H/O DM Type 2    P:   Checking Hgb A1c. SSI per Moderate algorithm. Accu-Checks q4hr. Holding home Lantus & Metformin.  FAMILY  - Updates: wife  8/24 - she would like to wait & is hopeful for recovery, does not agree with withdrawal of life support at this time  - Inter-disciplinary family meet or Palliative Care meeting due by:  Arranged for 8/25, wife & sons asked to choose a time in am  My cc time x 32 m  Cyril Mourning MD. [facility].  Pulmonary & Critical care Pager [phone] If no response call [phone]   03/25/2016

## 2016-03-26 DIAGNOSIS — Z978 Presence of other specified devices: Secondary | ICD-10-CM

## 2016-03-26 DIAGNOSIS — Z789 Other specified health status: Secondary | ICD-10-CM

## 2016-03-26 LAB — CBC
HCT: 39.4 % (ref 39.0–52.0)
HEMOGLOBIN: 12.1 g/dL — AB (ref 13.0–17.0)
MCH: 22 pg — AB (ref 26.0–34.0)
MCHC: 30.7 g/dL (ref 30.0–36.0)
MCV: 71.8 fL — AB (ref 78.0–100.0)
PLATELETS: 154 10*3/uL (ref 150–400)
RBC: 5.49 MIL/uL (ref 4.22–5.81)
RDW: 21.6 % — ABNORMAL HIGH (ref 11.5–15.5)
WBC: 6.4 10*3/uL (ref 4.0–10.5)

## 2016-03-26 LAB — GLUCOSE, CAPILLARY
GLUCOSE-CAPILLARY: 187 mg/dL — AB (ref 65–99)
GLUCOSE-CAPILLARY: 171 mg/dL — AB (ref 65–99)
Glucose-Capillary: 172 mg/dL — ABNORMAL HIGH (ref 65–99)
Glucose-Capillary: 189 mg/dL — ABNORMAL HIGH (ref 65–99)
Glucose-Capillary: 176 mg/dL — ABNORMAL HIGH (ref 65–99)
Glucose-Capillary: 193 mg/dL — ABNORMAL HIGH (ref 65–99)

## 2016-03-26 LAB — PROTIME-INR
INR: 1.62
Prothrombin Time: 19.4 seconds — ABNORMAL HIGH (ref 11.4–15.2)

## 2016-03-26 LAB — APTT: aPTT: 54 seconds — ABNORMAL HIGH (ref 24–36)

## 2016-03-26 LAB — HEPATIC FUNCTION PANEL
ALK PHOS: 59 U/L (ref 38–126)
ALT: 18 U/L (ref 17–63)
AST: 23 U/L (ref 15–41)
Albumin: 2.3 g/dL — ABNORMAL LOW (ref 3.5–5.0)
BILIRUBIN DIRECT: 0.2 mg/dL (ref 0.1–0.5)
BILIRUBIN INDIRECT: 0.5 mg/dL (ref 0.3–0.9)
Total Bilirubin: 0.7 mg/dL (ref 0.3–1.2)
Total Protein: 5.8 g/dL — ABNORMAL LOW (ref 6.5–8.1)

## 2016-03-26 MED ORDER — CHLORHEXIDINE GLUCONATE 0.12 % MT SOLN
15.0000 mL | Freq: Two times a day (BID) | OROMUCOSAL | Status: DC
  Administered 2016-03-26 – 2016-03-28 (×5): 15 mL via OROMUCOSAL
  Filled 2016-03-26 (×4): qty 15

## 2016-03-26 MED ORDER — ORAL CARE MOUTH RINSE
15.0000 mL | Freq: Two times a day (BID) | OROMUCOSAL | Status: DC
  Administered 2016-03-27 – 2016-03-28 (×4): 15 mL via OROMUCOSAL

## 2016-03-26 MED ORDER — CHLORHEXIDINE GLUCONATE 0.12 % MT SOLN
OROMUCOSAL | Status: AC
  Filled 2016-03-26: qty 15

## 2016-03-26 NOTE — Progress Notes (Signed)
ANTICOAGULATION CONSULT NOTE - Follow Up Consult  Pharmacy Consult for Bivalirudin Indication: aflutter   Allergies  Allergen Reactions  . Heparin Other (See Comments)    Will not use pork-derived products due to religious beliefs   . Lovenox [Enoxaparin Sodium] Other (See Comments)    Will not use pork-derived products due to religious beliefs   . Pork-Derived Products Other (See Comments)    Does not eat pork or pork derived products d/t religious reasons    Patient Measurements: Height: 5\' 7"  (170.2 cm) Weight: 236 lb 1.8 oz (107.1 kg) IBW/kg (Calculated) : 66.1  Vital Signs: Temp: 101.1 F (38.4 C) (08/25 0200) Temp Source: Core (Comment) (08/24 2000) BP: 128/81 (08/25 0630) Pulse Rate: 63 (08/25 0630)  Labs:  Recent Labs  03/24/16 0340 03/25/16 0330 03/26/16 0447  HGB 11.7* 12.1* 12.1*  HCT 37.8* 39.0 39.4  PLT 154 153 154  APTT 73* 65* 54*  LABPROT 25.8* 22.9* 19.4*  INR 2.31 1.99 1.62  CREATININE 1.03 0.98  --     Estimated Creatinine Clearance: 100.6 mL/min (by C-G formula based on SCr of 0.98 mg/dL).   Medications:  Bivalirudin @ 0.05mg /kg/hr  Assessment: 55yom on coumadin pta for aflutter, admitted s/p cardiac arrest. He has now completed the hypothermia protocol. Coumadin on hold and bivalirudin bridge started 8/21.  APTT is therapeutic at 59. Hgb stable, platelets stable. No bleeding.  Goal of Therapy:  aPTT 50-90 seconds Monitor platelets by anticoagulation protocol: Yes   Plan:  1) Continue bivalirudin @ 0.05mg /kg/hr 2) Daily APTT and CBC  Sheppard Coil PharmD., BCPS Clinical Pharmacist Pager [phone] 03/26/2016 7:14 AM

## 2016-03-26 NOTE — Progress Notes (Signed)
PULMONARY / CRITICAL CARE MEDICINE   Name: Benjamin [name] MRN: [id number] DOB: Mar 29, 1961    ADMISSION DATE:  03/31/2016 CONSULTATION DATE:  03/06/2016  REFERRING MD:  Julianne Rice, M.D. / EDP  CHIEF COMPLAINT:  Post Arrest  HISTORY OF PRESENT ILLNESS:   55 year old male s/p VT/VF arrest. Time to ROSC estimated at 12 minutes.  He is s/p hypothermia protocol  Coronary angiography this admission showed stable coronary anatomy compared to post-PCI in 4/17.  Felt primary arhythmia in setting of severe ICM  SUBJECTIVE:  Still w/ on-going myoclonus  No improvement  VITAL SIGNS: BP 122/76 (BP Location: Left Arm)   Pulse 62   Temp (!) 100.4 F (38 C) (Core (Comment))   Resp 20   Ht '5\' 7"'$  (1.702 m)   Wt 236 lb 1.8 oz (107.1 kg)   SpO2 100%   BMI 36.98 kg/m   HEMODYNAMICS: CVP:  [7 mmHg-8 mmHg] 8 mmHg  VENTILATOR SETTINGS: Vent Mode: CPAP;PSV FiO2 (%):  [40 %] 40 % Set Rate:  [16 bmp] 16 bmp Vt Set:  [530 mL] 530 mL PEEP:  [5 cmH20] 5 cmH20 Pressure Support:  [10 cmH20] 10 cmH20 Plateau Pressure:  [17 cmH20-20 cmH20] 19 cmH20  INTAKE / OUTPUT: I/O last 3 completed shifts: In: 3868.6 [I.V.:963.6; NG/GT:1710; IV Piggyback:1195] Out: 1630 [Urine:1630]  PHYSICAL EXAMINATION: General:  Unresponsive on MV; able to spont ventilate  Integument:  Warm & dry. No rash on exposed skin. No bruising.  HEENT:  No scleral injection or icterus. Endotracheal tube in place.  Cardiovascular:  Regular rate. No edema. No appreciable JVD.  Pulmonary:  Good aeration & clear to auscultation bilaterally. Symmetric chest wall rise on ventilator. Abdomen: Soft. Normal bowel sounds. Nondistended.  Musculoskeletal:  Normal bulk. No joint deformity or effusion appreciated. Neurological:  No withdrawal to pain in extremities. Pupils 55m and equal. Intermittent myoclonic twitching of eyelids , + cough   LABS:  BMET  Recent Labs Lab 03/23/16 0405 03/24/16 0340 03/25/16 0330  NA 140 140 140   K 3.6 4.5 4.3  CL 109 111 111  CO2 '22 24 24  '$ BUN '20 15 20  '$ CREATININE 1.21 1.03 0.98  GLUCOSE 125* 124* 138*   Electrolytes  Recent Labs Lab 03/23/16 0405 03/24/16 0340 03/25/16 0330  CALCIUM 8.2* 8.1* 8.4*  MG 1.7 2.2 1.9  PHOS 3.6 3.6 3.5    CBC  Recent Labs Lab 03/24/16 0340 03/25/16 0330 03/26/16 0447  WBC 6.8 6.9 6.4  HGB 11.7* 12.1* 12.1*  HCT 37.8* 39.0 39.4  PLT 154 153 154   Coag's  Recent Labs Lab 03/24/16 0340 03/25/16 0330 03/26/16 0447  APTT 73* 65* 54*  INR 2.31 1.99 1.62    Sepsis Markers  Recent Labs Lab 03/28/2016 1400 03/15/2016 2003 03/20/16 0200  LATICACIDVEN 2.2* 2.0* 2.3*   ABG  Recent Labs Lab 03/23/16 0436 03/24/16 0501 03/25/16 0400  PHART 7.474* 7.391 7.417  PCO2ART 27.9* 36.3 34.8*  PO2ART 136* 141* 102*   Liver Enzymes  Recent Labs Lab 03/24/16 0340 03/25/16 0330 03/26/16 0447  AST '28 24 23  '$ ALT '23 20 18  '$ ALKPHOS 57 60 59  BILITOT 0.7 0.7 0.7  ALBUMIN 2.4* 2.4* 2.3*   Cardiac Enzymes  Recent Labs Lab 03/28/2016 1345 03/28/2016 2003 03/20/16 0200  TROPONINI 0.35* 0.26* 0.21*   Glucose  Recent Labs Lab 03/25/16 1212 03/25/16 1617 03/25/16 2103 03/25/16 2333 03/26/16 0440 03/26/16 0748  GLUCAP 160* 161* 156* 190* 187* 172*  Imaging No results found. STUDIES:  PFT 12/12/15: FVC 3.06 L (68%) FEV1 2.11 L (61%) FEV1/FVC 0.69 positive bronchodilator response TLC 4.61 L (72%) RV 77% ERV 78% DLCO uncorrected 72% TTE 02/11/16: LV severely dilated. EF 15-20% w/ diffuse hypokinesis. Grade 3 diastolic dysfunction. RV mildly dilated with normal systolic function. Moderate mitral regurgitation. No aortic stenosis or regurgitation. Port CXR 8/18: Endotracheal tube in acceptable position. Suggestion of cardiomegaly. Left lower lobe opacity as well as right midlung opacity with air bronchograms suggesting consolidation. EEG 8/18 >> no over seizure activity, generalized low voltage and irregular delta  activity Head CT scan 8/18 >> poor gray-white differentiation throughout hemispheres, R occipital hypo-attenuation could represent chronic or sub-acute infarct L heart cath 8/18 >> no significant change from prior study 11/2015  MICROBIOLOGY: MRSA PCR 8/18 >> Tracheal Asp Ctx 8/18 >> neg  Blood Ctx x2 8/18 >>ng  ANTIBIOTICS: Unasyn 8/18 >>8/25  SIGNIFICANT EVENTS: 8/18 - Admit post arrest w/ cooling initiated & LHC  LINES/TUBES: OETT 7.0 8/18>>> Foley 8/18>>> R IJ TLC 8/18>>> PIV x2>>>  DISCUSSION:  55 year old male with known DM, HTN, DLD, & Systolic CHF (EF 25-42%). VF arrest w/ 12 min until ROSC. Initiated therapeutic hypothermia. Neurological prognosis poor based on myoclonus and abnormal CT head. We had long d/w wife. We will try to meet w/ her and the sons/brother. It is our opinion that he at best would be looking at PVS and trach/PEG. He can breath spontaneously and family is hopeful for a miracle. The wife does acknowledge that "god will take him when he is ready". Perhaps the best option will be to determine a finite amount of time to give the family a chance to see if things can get better and if they do not we extubate and just continue supportive care. We will present this to the sons when they arrive as well.   ASSESSMENT / PLAN:  CARDIOVASCULAR A:  S/P VF Arrest - 12 min downtime. Witnessed & CPR initiated by son. Shock - Likely due to medication/sedation +/- cardiogenic H/O NSTEMI/CAD H/O Systolic CHF H/O AICD Placement H/O HTN & DLD  P:  Vitals per unit protocol. Continuous telemetry monitoring. Amiodarone, digoxin. Systemic anticoagulation We will not add back vasoactive gtts   NEUROLOGIC A:   Acute Encephalopathy - Likely from anoxia. No reported purposeful movements post arrest or currently off sedation -S/p 33C Hypothermia Protocol  Myoclonus, due to anoxic injury. Unlikely to be seizures P:   RASS goal: 0. Appreciate Neurology's assistance. keppra  + depakote ordered.   PULMONARY A: Acute Hypoxic Respiratory Failure - Post arrest. Probable Aspiration - R Lung on CXR. Moderate COPD - Based on pre-bronchod spiro May 2017. Mild Restrictive Lung Disease - Based on lung vol May 2017. Likely due to cardiomyopathy.  P:   Continue full vent support & PSV   RENAL A:   Acute Renal Failure-->resolved  P:   Trending UOP with Foley Monitoring electrolytes & renal function, serial BMP Replacing electrolytes as indicated  GASTROINTESTINAL A:   Transaminitis - Mild. Likely due to shock.  P:   Ct TF per nutrition. Pepcid IV q12hr.  HEMATOLOGIC A:   Coagulopathy on Systemic Anticoagulation - Coumadin as outpatient. INR 1.9 on arrival. Leukocytosis - Likely reactive from arrest.  P:  Trending cell counts w/ CBC. SCDs. Anticoagulation per cardiology.  INFECTIOUS A:   Probable Aspiration Pneumonia vs Pneumonitis  P:   Empiric Unasyn Day #7-->will dc  ENDOCRINE A:   H/O DM  Type 2    P:   SSI per Moderate algorithm. Accu-Checks q4hr. Holding home Lantus & Metformin.  FAMILY  - Updates: wife  8/25 - she would like to wait & is hopeful for recovery, does not agree with withdrawal of life support at this time, we will try to meet later w/ family. She acknowledges that "god will take him", may be best option to get him to an agreed upon time frame and extubate as he does have spontaneous efforts. Then family can feel as though they have allowed for time for a "miracle".   - Inter-disciplinary family meet or Palliative Care meeting due by:  Arranged for 8/25, wife & sons asked to choose a time in am   Erick Colace ACNP-BC [place] Pager # [phone] OR # [phone] if no answer  03/26/2016    STAFF NOTE: I, Merrie Roof, MD FACP have personally reviewed patient's available data, including medical history, events of note, physical examination and test results as part of my evaluation. I have  discussed with resident/NP and other care providers such as pharmacist, RN and RRT. In addition, I personally evaluated patient and elicited key findings of: not awake, myoclonic face and upper ext and chest, ronchi, not following commands, ronchi, favor neg balance, his prognosis is horrible for any meaningful recovery and would be futile and medcially ineffective to trach, peg or support long term, plan is weaning ps 10 cpap 5 goals met, he does spont breathing ans initiates breaths, if extubated in future he would NOT survive and would not protect airway long, I sat down with pete and wife extensive in room discussing goals of care,also at around 445 pm with entire family, children and siblings of the pt with wife, again described his poor prognosis, CT scan , eeg and continued myoclonis, Iofferred plan of support till moday or max wed then extubation and hope fo rbest but stressed he would not protect airway, they seemed supportive of this plan, I stressed that we should not change  Our monds wed and ask for long term support then as would be futile, not impressed pNA, received course abx, dc abx, neuro following, need more aggresive treatment myoclonus, more benzo? Max depakote? The patient is critically ill with multiple organ systems failure and requires high complexity decision making for assessment and support, frequent evaluation and titration of therapies, application of advanced monitoring technologies and extensive interpretation of multiple databases.   Critical Care Time devoted to patient care services described in this note is 30 Minutes. This time reflects time of care of this signee: Merrie Roof, MD FACP. This critical care time does not reflect procedure time, or teaching time or supervisory time of PA/NP/Med student/Med Resident etc but could involve care discussion time. Rest per NP/medical resident whose note is outlined above and that I agree with   Lavon Paganini. Titus Mould, MD,  [place] Pgr: [place] [facility] 03/26/2016 10:05 PM

## 2016-03-27 DIAGNOSIS — G931 Anoxic brain damage, not elsewhere classified: Secondary | ICD-10-CM

## 2016-03-27 DIAGNOSIS — Z452 Encounter for adjustment and management of vascular access device: Secondary | ICD-10-CM

## 2016-03-27 LAB — GLUCOSE, CAPILLARY
GLUCOSE-CAPILLARY: 161 mg/dL — AB (ref 65–99)
GLUCOSE-CAPILLARY: 162 mg/dL — AB (ref 65–99)
GLUCOSE-CAPILLARY: 189 mg/dL — AB (ref 65–99)
GLUCOSE-CAPILLARY: 221 mg/dL — AB (ref 65–99)
Glucose-Capillary: 201 mg/dL — ABNORMAL HIGH (ref 65–99)
Glucose-Capillary: 157 mg/dL — ABNORMAL HIGH (ref 65–99)

## 2016-03-27 LAB — HEPATIC FUNCTION PANEL
ALBUMIN: 2.3 g/dL — AB (ref 3.5–5.0)
ALT: 18 U/L (ref 17–63)
AST: 27 U/L (ref 15–41)
Alkaline Phosphatase: 54 U/L (ref 38–126)
Bilirubin, Direct: 0.2 mg/dL (ref 0.1–0.5)
Indirect Bilirubin: 0.7 mg/dL (ref 0.3–0.9)
TOTAL PROTEIN: 5.6 g/dL — AB (ref 6.5–8.1)
Total Bilirubin: 0.9 mg/dL (ref 0.3–1.2)

## 2016-03-27 LAB — CBC
HEMATOCRIT: 40.3 % (ref 39.0–52.0)
HEMOGLOBIN: 12.5 g/dL — AB (ref 13.0–17.0)
MCH: 22.4 pg — AB (ref 26.0–34.0)
MCHC: 31 g/dL (ref 30.0–36.0)
MCV: 72.1 fL — AB (ref 78.0–100.0)
Platelets: 151 10*3/uL (ref 150–400)
RBC: 5.59 MIL/uL (ref 4.22–5.81)
RDW: 22 % — ABNORMAL HIGH (ref 11.5–15.5)
WBC: 8.3 10*3/uL (ref 4.0–10.5)

## 2016-03-27 LAB — VALPROIC ACID LEVEL: VALPROIC ACID LVL: 49 ug/mL — AB (ref 50.0–100.0)

## 2016-03-27 LAB — APTT
aPTT: 46 seconds — ABNORMAL HIGH (ref 24–36)
aPTT: 50 seconds — ABNORMAL HIGH (ref 24–36)

## 2016-03-27 LAB — DIGOXIN LEVEL: DIGOXIN LVL: 0.2 ng/mL — AB (ref 0.8–2.0)

## 2016-03-27 MED ORDER — SODIUM CHLORIDE 0.9 % IV SOLN
1.0000 mg/h | INTRAVENOUS | Status: DC
  Administered 2016-03-27: 1 mg/h via INTRAVENOUS
  Administered 2016-03-28: 3 mg/h via INTRAVENOUS
  Filled 2016-03-27 (×3): qty 10

## 2016-03-27 NOTE — Progress Notes (Addendum)
ANTICOAGULATION CONSULT NOTE - Follow Up Consult  Pharmacy Consult for Bivalirudin Indication: aflutter   Allergies  Allergen Reactions  . Heparin Other (See Comments)    Will not use pork-derived products due to religious beliefs   . Lovenox [Enoxaparin Sodium] Other (See Comments)    Will not use pork-derived products due to religious beliefs   . Pork-Derived Products Other (See Comments)    Does not eat pork or pork derived products d/t religious reasons    Patient Measurements: Height: 5\' 7"  (170.2 cm) Weight: 237 lb 14 oz (107.9 kg) IBW/kg (Calculated) : 66.1  Vital Signs: Temp: 100.2 F (37.9 C) (08/26 0700) Temp Source: Core (Comment) (08/26 0000) BP: 131/77 (08/26 0741) Pulse Rate: 61 (08/26 0741)  Labs:  Recent Labs  03/25/16 0330 03/26/16 0447 03/27/16 0445  HGB 12.1* 12.1* 12.5*  HCT 39.0 39.4 40.3  PLT 153 154 151  APTT 65* 54* 46*  LABPROT 22.9* 19.4*  --   INR 1.99 1.62  --   CREATININE 0.98  --   --     Estimated Creatinine Clearance: 100.9 mL/min (by C-G formula based on SCr of 0.98 mg/dL).    Assessment: 55yom on coumadin pta for aflutter, admitted s/p cardiac arrest. He has now completed the hypothermia protocol. Coumadin on hold and bivalirudin bridge started 8/21.  APTT is now subtherapeutic at 46 on Bivalirudin @ 0.05mg /kg/hr. Hgb stable, platelets stable. No bleeding.  Goal of Therapy:  aPTT 50-90 seconds Monitor platelets by anticoagulation protocol: Yes   Plan:  1) Increase bivalirudin @ 0.06mg /kg/hr 2) Daily APTT and CBC   Leota Sauers Pharm.D. CPP, BCPS Clinical Pharmacist [phone] 03/27/2016 8:36 AM    Discussed with cardiology Will stop  VA level 49 just below goal 50-100 on VA 500 q8 - Sz now controled on midazolam drip at 1mg /hr - no change in [place] dose at this time  Taft Conrow Pharm.D. CPP, BCPS Clinical Pharmacist [phone] 03/27/2016 2:28 PM

## 2016-03-27 NOTE — Progress Notes (Signed)
PULMONARY / CRITICAL CARE MEDICINE   Name: Benjamin [name] MRN: [id number] DOB: 15-Mar-1961    ADMISSION DATE:  03/14/2016 CONSULTATION DATE:  03/07/2016  REFERRING MD:  Julianne Rice, M.D. / EDP  CHIEF COMPLAINT:  Post Arrest  HISTORY OF PRESENT ILLNESS:   55 year old male s/p VT/VF arrest. Time to ROSC estimated at 12 minutes.  He is s/p hypothermia protocol  Coronary angiography this admission showed stable coronary anatomy compared to post-PCI in 4/17.  Felt primary arhythmia in setting of severe ICM  SUBJECTIVE:  Severe myoclonus remains Vent  VITAL SIGNS: BP 131/77   Pulse 61   Temp (!) 100.6 F (38.1 C) (Rectal)   Resp (!) 21   Ht '5\' 7"'$  (1.702 m)   Wt 107.9 kg (237 lb 14 oz)   SpO2 100%   BMI 37.26 kg/m   HEMODYNAMICS:    VENTILATOR SETTINGS: Vent Mode: PRVC FiO2 (%):  [40 %] 40 % Set Rate:  [16 bmp] 16 bmp Vt Set:  [530 mL] 530 mL PEEP:  [5 cmH20] 5 cmH20 Pressure Support:  [8 cmH20] 8 cmH20 Plateau Pressure:  [17 cmH20-20 cmH20] 18 cmH20  INTAKE / OUTPUT: I/O last 3 completed shifts: In: 3044.7 [I.V.:684.7; NG/GT:1635; IV Piggyback:725] Out: 2170 [Urine:2170]  PHYSICAL EXAMINATION: General:  Unresponsive on MV HEENT:  Myoclonus facial muslces Cardiovascular: s1 s2  Regular rate. No edema. No appreciable JVD.  Pulmonary:  CTA ant Abdomen: Soft. Normal bowel sounds. Nondistended.  Musculoskeletal:edema Neurological: mycoclonus remains, per 4 sluggish   LABS:  BMET  Recent Labs Lab 03/23/16 0405 03/24/16 0340 03/25/16 0330  NA 140 140 140  K 3.6 4.5 4.3  CL 109 111 111  CO2 '22 24 24  '$ BUN '20 15 20  '$ CREATININE 1.21 1.03 0.98  GLUCOSE 125* 124* 138*   Electrolytes  Recent Labs Lab 03/23/16 0405 03/24/16 0340 03/25/16 0330  CALCIUM 8.2* 8.1* 8.4*  MG 1.7 2.2 1.9  PHOS 3.6 3.6 3.5    CBC  Recent Labs Lab 03/25/16 0330 03/26/16 0447 03/27/16 0445  WBC 6.9 6.4 8.3  HGB 12.1* 12.1* 12.5*  HCT 39.0 39.4 40.3  PLT 153 154 151    Coag's  Recent Labs Lab 03/24/16 0340 03/25/16 0330 03/26/16 0447 03/27/16 0445  APTT 73* 65* 54* 46*  INR 2.31 1.99 1.62  --     Sepsis Markers No results for input(s): LATICACIDVEN, PROCALCITON, O2SATVEN in the last 168 hours. ABG  Recent Labs Lab 03/23/16 0436 03/24/16 0501 03/25/16 0400  PHART 7.474* 7.391 7.417  PCO2ART 27.9* 36.3 34.8*  PO2ART 136* 141* 102*   Liver Enzymes  Recent Labs Lab 03/25/16 0330 03/26/16 0447 03/27/16 0445  AST '24 23 27  '$ ALT '20 18 18  '$ ALKPHOS 60 59 54  BILITOT 0.7 0.7 0.9  ALBUMIN 2.4* 2.3* 2.3*   Cardiac Enzymes No results for input(s): TROPONINI, PROBNP in the last 168 hours. Glucose  Recent Labs Lab 03/26/16 1223 03/26/16 1625 03/26/16 2003 03/26/16 2305 03/27/16 0338 03/27/16 0840  GLUCAP 189* 176* 171* 193* 189* 221*   Imaging No results found. STUDIES:  PFT 12/12/15: FVC 3.06 L (68%) FEV1 2.11 L (61%) FEV1/FVC 0.69 positive bronchodilator response TLC 4.61 L (72%) RV 77% ERV 78% DLCO uncorrected 72% TTE 02/11/16: LV severely dilated. EF 15-20% w/ diffuse hypokinesis. Grade 3 diastolic dysfunction. RV mildly dilated with normal systolic function. Moderate mitral regurgitation. No aortic stenosis or regurgitation. Port CXR 8/18: Endotracheal tube in acceptable position. Suggestion of cardiomegaly. Left lower  lobe opacity as well as right midlung opacity with air bronchograms suggesting consolidation. EEG 8/18 >> no over seizure activity, generalized low voltage and irregular delta activity Head CT scan 8/18 >> poor gray-white differentiation throughout hemispheres, R occipital hypo-attenuation could represent chronic or sub-acute infarct L heart cath 8/18 >> no significant change from prior study 11/2015  MICROBIOLOGY: MRSA PCR 8/18 >> Tracheal Asp Ctx 8/18 >> neg  Blood Ctx x2 8/18 >>ng  ANTIBIOTICS: Unasyn 8/18 >>8/25  SIGNIFICANT EVENTS: 8/18 - Admit post arrest w/ cooling initiated & LHC 8/25- df family  meeting, plan is support max wed then dc vent and comfort if not improved  LINES/TUBES: OETT 7.0 8/18>>> Foley 8/18>>> R IJ TLC 8/18>>> PIV x2>>>  DISCUSSION:  55 year old male with known DM, HTN, DLD, & Systolic CHF (EF 45-80%). VF arrest w/ 12 min until ROSC. Initiated therapeutic hypothermia. Neurological prognosis poor based on myoclonus and abnormal CT head. We had long d/w wife. We will try to meet w/ her and the sons/brother. It is our opinion that he at best would be looking at PVS and trach/PEG. He can breath spontaneously and family is hopeful for a miracle. The wife does acknowledge that "god will take him when he is ready". Perhaps the best option will be to determine a finite amount of time to give the family a chance to see if things can get better and if they do not we extubate and just continue supportive care. We will present this to the sons when they arrive as well.   ASSESSMENT / PLAN:  CARDIOVASCULAR A:  S/P VF Arrest - 12 min downtime. Witnessed & CPR initiated by son. Shock - Likely due to medication/sedation +/- cardiogenic H/O NSTEMI/CAD H/O Systolic CHF H/O AICD Placement H/O HTN & DLD  P:  Vitals per unit protocol. Continuous telemetry monitoring. Digoxin, crt in am  Systemic anticoagulation- unsure what the benefit of this is , outcome will be poor  NEUROLOGIC A:   Acute Encephalopathy - Likely from anoxia. No reported purposeful movements post arrest or currently off sedation -S/p 33C Hypothermia Protocol  Myoclonus, due to anoxic injury. Unlikely to be seizures Severe mycolonus P:   RASS goal: 0. Appreciate Neurology's assistance. keppra depakote increase( get level) Add around clock versed , likely needs drip    PULMONARY A: Acute Hypoxic Respiratory Failure - Post arrest. Probable Aspiration - R Lung on CXR. Moderate COPD - Based on pre-bronchod spiro May 2017. Mild Restrictive Lung Disease - Based on lung vol May 2017. Likely due to  cardiomyopathy.  P:   PS cpap 5/5, goal 2 hours then back to rest Would meet again with fmaily Monday continued vent support futile  RENAL A:   Acute Renal Failure-->resolved  P:   Even balance goals If weaning poor, add lasix  GASTROINTESTINAL A:   Transaminitis - Mild. Likely due to shock.  P:   Ct TF per nutrition. Pepcid IV q12hr.  HEMATOLOGIC A:   Coagulopathy on Systemic Anticoagulation - Coumadin as outpatient. INR 1.9 on arrival. Leukocytosis - Likely reactive from arrest.  P:  scd Can we dc bival? Will this change his outcome?  INFECTIOUS A:   Probable Aspiration Pneumonia vs Pneumonitis  P:   S/p ABX course Follow temp curve, from myoclonus, neuro ENDOCRINE A:   H/O DM Type 2    P:   SSI per Moderate algorithm. Accu-Checks q4hr. NICE goals met  FAMILY  - Updates: 8/26- family meeting goals by DF,  goal is meet Monday and dc vent no later wed, we pccm will NOT PEG or trach him, will be futile and cause suffering  - Inter-disciplinary family meet or Palliative Care meeting due by:  Arranged for 8/25, wife & sons asked to choose a time in am   Ccm time 35 min   Lavon Paganini. Titus Mould, MD, [place] Pgr: [facility] 03/27/2016 10:32 AM

## 2016-03-27 NOTE — Progress Notes (Signed)
   03/27/16 0944  Vent Select  Invasive or Noninvasive Invasive  Adult Vent Y  Airway 7 mm  Placement Date/Time: 03/17/2016 1008   Inserted prior to hospital arrival?: Yes  Placed By: EMS  Airway Device: Endotracheal Tube  ETT Types: Oral  Size (mm): 7 mm  Cuffed: Cuffed  Placement Confirmation: ETCO2 (Capnography);Chest Rise;Bilateral Breath ...  Secured at (cm) 22 cm  Measured From Lips  Secured Location Center  Secured By Publishing rights manager  Adult Ventilator Settings  Vent Type Servo i  Humidity HME  Vent Mode PRVC  Vt Set 530 mL  Set Rate 16 bmp  FiO2 (%) 40 %  PEEP 5 cmH20  Adult Ventilator Measurements  Peak Airway Pressure 22 L/min  Mean Airway Pressure 11 cmH20  Resp Rate Spontaneous 15 br/min  Resp Rate Total 16 br/min  Exhaled Vt 587 mL  Measured Ve 14.9 mL  SpO2 100 %  Placed patient back on previous settings due to increased wob.  RN aware.

## 2016-03-28 DIAGNOSIS — J9600 Acute respiratory failure, unspecified whether with hypoxia or hypercapnia: Secondary | ICD-10-CM

## 2016-03-28 LAB — GLUCOSE, CAPILLARY
GLUCOSE-CAPILLARY: 156 mg/dL — AB (ref 65–99)
GLUCOSE-CAPILLARY: 185 mg/dL — AB (ref 65–99)
GLUCOSE-CAPILLARY: 170 mg/dL — AB (ref 65–99)
Glucose-Capillary: 157 mg/dL — ABNORMAL HIGH (ref 65–99)
Glucose-Capillary: 180 mg/dL — ABNORMAL HIGH (ref 65–99)
Glucose-Capillary: 200 mg/dL — ABNORMAL HIGH (ref 65–99)

## 2016-03-28 LAB — HEPATIC FUNCTION PANEL
ALBUMIN: 2.1 g/dL — AB (ref 3.5–5.0)
ALT: 25 U/L (ref 17–63)
AST: 41 U/L (ref 15–41)
Alkaline Phosphatase: 55 U/L (ref 38–126)
Bilirubin, Direct: 0.2 mg/dL (ref 0.1–0.5)
Indirect Bilirubin: 0.5 mg/dL (ref 0.3–0.9)
Total Bilirubin: 0.7 mg/dL (ref 0.3–1.2)
Total Protein: 5.3 g/dL — ABNORMAL LOW (ref 6.5–8.1)

## 2016-03-28 LAB — CBC
HCT: 38.9 % — ABNORMAL LOW (ref 39.0–52.0)
HEMOGLOBIN: 11.9 g/dL — AB (ref 13.0–17.0)
MCH: 22.5 pg — AB (ref 26.0–34.0)
MCHC: 30.6 g/dL (ref 30.0–36.0)
MCV: 73.4 fL — AB (ref 78.0–100.0)
Platelets: 159 10*3/uL (ref 150–400)
RBC: 5.3 MIL/uL (ref 4.22–5.81)
RDW: 22.7 % — ABNORMAL HIGH (ref 11.5–15.5)
WBC: 10.2 10*3/uL (ref 4.0–10.5)

## 2016-03-28 MED ORDER — VALPROATE SODIUM 500 MG/5ML IV SOLN
750.0000 mg | Freq: Three times a day (TID) | INTRAVENOUS | Status: DC
  Administered 2016-03-28 (×2): 750 mg via INTRAVENOUS
  Filled 2016-03-28 (×5): qty 7.5

## 2016-03-28 NOTE — Progress Notes (Addendum)
Patient ID: Benjamin [name] [place], male   DOB: 1961-04-10, 55 y.o.   MRN: [id number]    Patient Name: Benjamin [name] [place] Date of Encounter: 03/28/2016     Active Problems:   Cardiac arrest with ventricular fibrillation [facility](HCC)   CAD S/P percutaneous coronary angioplasty   Cardiac arrest [facility]HCC)   Encounter for central line care   Endotracheally intubated   Anoxic brain injury (HCC)    SUBJECTIVE  Remains intubated. He is sedated with Versed to help control myoclonus.  CURRENT MEDS . chlorhexidine  15 mL Mouth Rinse BID  . clopidogrel  75 mg Per Tube Daily  . digoxin  0.0625 mg Oral Daily  . famotidine (PEPCID) IV  20 mg Intravenous Q12H  . feeding supplement (PRO-STAT SUGAR FREE 64)  30 mL Per Tube Daily  . feeding supplement (VITAL HIGH PROTEIN)  1,000 mL Per Tube Q24H  . insulin aspart  0-15 Units Subcutaneous Q4H  . levETIRAcetam  1,000 mg Intravenous BID  . mouth rinse  15 mL Mouth Rinse q12n4p  . rosuvastatin  20 mg Per Tube q1800  . sodium chloride flush  10-40 mL Intracatheter Q12H  . sodium chloride flush  3 mL Intravenous Q12H  . valproate sodium  500 mg Intravenous Q8H    OBJECTIVE  Vitals:   03/28/16 0600 03/28/16 0700 03/28/16 0752 03/28/16 0756  BP: 119/65 126/64  126/64  Pulse: 62 64  64  Resp: 20 20  19   Temp:      TempSrc:      SpO2: 100% 100% 100% 100%  Weight:      Height:        Intake/Output Summary (Last 24 hours) at 03/28/16 0901 Last data filed at 03/28/16 0600  Gross per 24 hour  Intake          1842.28 ml  Output             1510 ml  Net           332.28 ml   Filed Weights   03/26/16 0430 03/27/16 0500 03/28/16 0400  Weight: 236 lb 1.8 oz (107.1 kg) 237 lb 14 oz (107.9 kg) 241 lb 6.5 oz (109.5 kg)    PHYSICAL EXAM  General:Intubated and sedated Neuro: Does not respond to verbal stimuli. HEENT:  Endotracheal tube is in place  Neck: Supple without bruit, 8 cm JVD. Lungs:  Resp regular and unlabored on the ventilator, scattered rales Heart: RRR no  s3, s4, or murmurs. Heart sounds are distant Abdomen: Soft, non-tender, non-distended, BS + x 4.  Extremities: No clubbing, cyanosis or edema. DP/PT/Radials 2+ and equal bilaterally.  Accessory Clinical Findings  CBC  Recent Labs  03/27/16 0445 03/28/16 0500  WBC 8.3 10.2  HGB 12.5* 11.9*  HCT 40.3 38.9*  MCV 72.1* 73.4*  PLT 151 159   Basic Metabolic Panel No results for input(s): NA, K, CL, CO2, GLUCOSE, BUN, CREATININE, CALCIUM, MG, PHOS in the last 72 hours. Liver Function Tests  Recent Labs  03/27/16 0445 03/28/16 0500  AST 27 41  ALT 18 25  ALKPHOS 54 55  BILITOT 0.9 0.7  PROT 5.6* 5.3*  ALBUMIN 2.3* 2.1*   No results for input(s): LIPASE, AMYLASE in the last 72 hours. Cardiac Enzymes No results for input(s): CKTOTAL, CKMB, CKMBINDEX, TROPONINI in the last 72 hours. BNP Invalid input(s): POCBNP [name]-Dimer No results for input(s): DDIMER in the last 72 hours. Hemoglobin A1C No results for input(s): HGBA1C in the last 72 hours.  Fasting Lipid Panel No results for input(s): CHOL, HDL, LDLCALC, TRIG, CHOLHDL, LDLDIRECT in the last 72 hours. Thyroid Function Tests No results for input(s): TSH, T4TOTAL, T3FREE, THYROIDAB in the last 72 hours.  Invalid input(s): FREET3  TELE  Normal sinus rhythm   Radiology/Studies  Ct Head Wo Contrast  Result Date: 03/21/2016 CLINICAL DATA:  Ventricular fibrillation arrest at home. Post anoxic myoclonus. No meaningful response to external stimuli. EXAM: CT HEAD WITHOUT CONTRAST TECHNIQUE: Contiguous axial images were obtained from the base of the skull through the vertex without intravenous contrast. COMPARISON:  Portable CT scan 03/24/2016. FINDINGS: Brain: Muted gray-white differentiation throughout the cerebral hemispheres, without frank reversal of the normal attenuation relationship between gray and white matter, nevertheless concerning for early hypoxic ischemic injury. Chronic RIGHT occipital infarct, well-demarcated, and  stable. No secondary signs of increased intracranial pressure. Premature for age atrophy. No hemorrhage, mass lesion, or extra-axial fluid. Vascular: Vascular calcification.  No hyperdense vessel proximally. Skull: Unremarkable. Asymmetric thickness and hyperattenuation of the scalp soft tissues over the LEFT convexity, suggesting scalp hematoma, new/increased from portable CT. Sinuses/Orbits: Layering fluid in the sphenoid and mastoids reflect recumbency and intubation. Other: None. IMPRESSION: Muted gray-white differentiation similar to portable CT scan performed on 08/18. Early hypoxic ischemic injury is not excluded. No signs of large vessel occlusion, increased intracranial pressure, or intracranial hemorrhage. Continued surveillance is warranted. Electronically Signed   By: Elsie Stain M.[name].   On: 03/21/2016 09:44   Dg Chest Port 1 View  Result Date: 03/25/2016 CLINICAL DATA:  55 y/o  M; endotracheal tube. EXAM: PORTABLE CHEST 1 VIEW COMPARISON:  Chest radiograph 03/24/2016. FINDINGS: Enteric tube tip below the field of view and abdomen. Right central venous catheter tip projects over lower SVC. Three lead AICD noted. Endotracheal tube in stable position. The left costophrenic angles excluded from field of view. Diffuse airspace opacities with basilar predominance are mildly increased probably representing worsening edema, possibly pneumonia. Stable small right effusion. IMPRESSION: Mild increase in airspace opacities probably represents worsening edema, possibly pneumonia. Small stable right effusion. Electronically Signed   By: Mitzi Hansen M.[name].   On: 03/25/2016 06:24   Dg Chest Port 1 View  Result Date: 03/24/2016 CLINICAL DATA:  Shortness of breath. EXAM: PORTABLE CHEST 1 VIEW COMPARISON:  03/23/2016. FINDINGS: Endotracheal tube, NG tube, right IJ line in stable position. AICD in stable position. Cardiomegaly with bilateral pulmonary vascular prominence interstitial prominence with  bilateral pleural effusions again noted. Findings consistent congestive heart failure. Slight interim worsening. No pneumothorax . IMPRESSION: 1. Lines and tubes in stable position. 2. AICD in stable position. Congestive heart failure with bilateral pulmonary edema and bilateral pleural effusions. Slight worsening from prior exam. Electronically Signed   By: Maisie Fus  Register   On: 03/24/2016 06:53   Dg Chest Port 1 View  Result Date: 03/23/2016 CLINICAL DATA:  Shortness of breath. EXAM: PORTABLE CHEST 1 VIEW COMPARISON:  03/22/2016. FINDINGS: Endotracheal tube, NG tube, right IJ line stable position. AICD in stable position. Cardiomegaly with pulmonary vascular prominence and bilateral interstitial prominence bilateral pleural effusions. Findings consistent with congestive heart failure no pneumothorax. IMPRESSION: 1.  Lines and tubes in stable position. 2. AICD in stable position. Cardiomegaly with pulmonary vascular prominence and bilateral interstitial prominence bilateral pleural effusions consistent congestive heart failure with pulmonary interstitial edema. Similar findings noted on prior exam Electronically Signed   By: Maisie Fus  Register   On: 03/23/2016 07:00   Dg Chest Port 1 View  Result Date: 03/22/2016 CLINICAL DATA:  Acute respiratory failure EXAM: PORTABLE CHEST 1 VIEW COMPARISON:  Chest radiograph 03/21/2016 FINDINGS: Endotracheal tube tip is at the level of the clavicular heads. A right IJ central venous catheter overlies the lower SVC. Unchanged appearance of left chest wall pacemaker/AICD. Aeration of the lungs has improved. There is persistent pulmonary edema, decreased from the prior study. Small left pleural effusion. No pneumothorax. Persistent cardiomegaly. IMPRESSION: Mildly improved lung inflation with decreased pulmonary edema. Electronically Signed   By: Deatra Robinson M.[name].   On: 03/22/2016 05:58   Dg Chest Port 1 View  Result Date: 03/21/2016 CLINICAL DATA:  Acute respiratory  failure. EXAM: PORTABLE CHEST 1 VIEW COMPARISON:  04-08-16. FINDINGS: The heart remains enlarged. BILATERAL pulmonary opacities favored to represent pulmonary edema, worse from priors. Endotracheal tube tip satisfactory position, 3.9 cm above carina. Dual lead pacer unchanged. Central venous catheter tip proximal RIGHT atrium from RIGHT IJ approach. IMPRESSION: Worsening aeration.  Cardiomegaly with pulmonary edema. Electronically Signed   By: Elsie Stain M.[name].   On: 03/21/2016 07:43   Dg Chest Port 1 View  Result Date: April 08, 2016 CLINICAL DATA:  Central catheter placement EXAM: PORTABLE CHEST 1 VIEW COMPARISON:  Study obtained earlier in the day FINDINGS: Central catheter tip is in the superior vena cava. Nasogastric tube tip and side port are below the diaphragm. Endotracheal tube tip is 5.4 cm above the carina. Pacemaker leads are attached to the right atrium, right ventricle, and left ventricle, stable. No pneumothorax. There is atelectatic change in the left base. Lungs elsewhere clear. There is cardiomegaly. The pulmonary vascularity is within normal limits. No adenopathy. IMPRESSION: Tube and catheter positions as described without pneumothorax. Stable cardiomegaly. Left base atelectasis, unchanged. No new opacity. Electronically Signed   By: Bretta Bang III M.[name].   On: 04-08-2016 14:10   Dg Chest Port 1 View  Result Date: 04/08/2016 CLINICAL DATA:  Evaluate endotracheal tube placement. EXAM: PORTABLE CHEST 1 VIEW COMPARISON:  01/12/2016 FINDINGS: Endotracheal tube is 4.5 cm above the carina. The cardiac silhouette is enlarged. There is a left-sided biventricular ICD. Haziness and increased densities in the perihilar regions are suggestive for vascular congestion and edema. Negative for a pneumothorax. IMPRESSION: Endotracheal tube is appropriately positioned. Cardiomegaly with central vascular congestion and mild edema. Electronically Signed   By: Richarda Overlie M.[name].   On: 08-Apr-2016 10:30   Ct  Portable Head W/o Cm  Result Date: 04/08/2016 CLINICAL DATA:  Status post cardiac arrest today for 8 minutes. EXAM: CT HEAD WITHOUT CONTRAST TECHNIQUE: Contiguous axial images were obtained from the base of the skull through the vertex without intravenous contrast. COMPARISON:  05/21/2008. FINDINGS: Study was performed with a portable apparatus. Image quality is reduced. Brain: There is a area of 27 x 16 mm hypoattenuation in the RIGHT occipital lobe, uncertain significance and duration. This was not present in 2009, but image quality is insufficient to distinguish between an acute versus chronic infarct. Hypoattenuation due to a mass is less likely. Poor gray-white differentiation throughout the cerebral hemispheres. Anoxic- ischemic injury is not excluded. No hemorrhage, hydrocephalus, or extra-axial fluid. Mild atrophy, premature for age. Vascular: No hyperdense vessel.  Carotid siphon calcification. Skull: Negative Sinuses/Orbits: Negative Other: None. IMPRESSION: Poor gray-white differentiation throughout the cerebral hemispheres. Anoxic ischemic injury is not excluded. Superficial RIGHT occipital lobe area of hypoattenuation, approximately the 3 x 1.5 cm, could represent an acute or chronic infarct. Continued surveillance warranted. No intracranial hemorrhage or large vessel occlusion. Electronically Signed   By: Dale Meade.[name].  On: 03/28/2016 20:16    ASSESSMENT AND PLAN  1. Ventricular fibrillation cardiac arrest - he has had no recurrent ventricular arrhythmias. 2. ICD under sensing of #1. I suspect this represents an overall severity of his cardiac function. 3. End-stage cardiomyopathy, ischemic - no evidence of ongoing ischemia 4. Anoxic encephalopathy - results reviewed of EEG and other scans. Note very poor prognosis. At this point, he has a very poor prognosis and no additional recommendations to offer. Please call for questions. Please let us know if you would like to turn off  tachycardia therapies for his ICD.  Gregg Taylor,M.[name].  03/28/2016 9:01 AM

## 2016-03-28 NOTE — Progress Notes (Signed)
   03/28/16 1152  Vent Select  Invasive or Noninvasive Invasive  Adult Vent Y  Adult Ventilator Settings  Vent Type Servo i  Humidity HME  Vent Mode PSV  FiO2 (%) 40 %  IPAP 10 cmH20  EPAP 5 cmH20  Adult Ventilator Measurements  Peak Airway Pressure 15 L/min  Mean Airway Pressure 9 cmH20  Resp Rate Spontaneous 26 br/min  Resp Rate Total 26 br/min  Exhaled Vt 517 mL  Measured Ve 11.9 mL  SpO2 100 %  Placed patient on PSV per md request.  Patient tolerating well at this time.

## 2016-03-28 NOTE — Progress Notes (Signed)
PULMONARY / CRITICAL CARE MEDICINE   Name: Benjamin [name] MRN: [id number] DOB: 11/19/1960    ADMISSION DATE:  03/24/2016 CONSULTATION DATE:  03/13/2016  REFERRING MD:  David Yelverton, M.D. / EDP  CHIEF COMPLAINT:  Post Arrest  HISTORY OF PRESENT ILLNESS:   55 year old male s/p VT/VF arrest. Time to ROSC estimated at 12 minutes.  He is s/p hypothermia protocol  Coronary angiography this admission showed stable coronary anatomy compared to post-PCI in 4/17.  Felt primary arhythmia in setting of severe ICM  SUBJECTIVE:  Severe myoclonus improved with versed drip  VITAL SIGNS: BP 119/72   Pulse 63   Temp 99.7 F (37.6 C) (Core (Comment))   Resp 18   Ht 5' 7" (1.702 m)   Wt 109.5 kg (241 lb 6.5 oz)   SpO2 100%   BMI 37.81 kg/m   HEMODYNAMICS:    VENTILATOR SETTINGS: Vent Mode: PRVC FiO2 (%):  [40 %] 40 % Set Rate:  [14 bmp-16 bmp] 16 bmp Vt Set:  [530 mL] 530 mL PEEP:  [5 cmH20] 5 cmH20 Plateau Pressure:  [16 cmH20-19 cmH20] 19 cmH20  INTAKE / OUTPUT: I/O last 3 completed shifts: In: 3139 [I.V.:749; NG/GT:1870; IV Piggyback:520] Out: 2560 [Urine:2560]  PHYSICAL EXAMINATION: General:  Unresponsive HEENT:  Myoclonus facial muscles improved Cardiovascular: s1 s2  Regular rate. No edema Pulmonary:  CTA Abdomen: Soft. Normal bowel sounds. Nondistended.  Musculoskeletal:edema about same Neurological: mycoclonus resolved  LABS:  BMET  Recent Labs Lab 03/23/16 0405 03/24/16 0340 03/25/16 0330  NA 140 140 140  K 3.6 4.5 4.3  CL 109 111 111  CO2 22 24 24  BUN 20 15 20  CREATININE 1.21 1.03 0.98  GLUCOSE 125* 124* 138*   Electrolytes  Recent Labs Lab 03/23/16 0405 03/24/16 0340 03/25/16 0330  CALCIUM 8.2* 8.1* 8.4*  MG 1.7 2.2 1.9  PHOS 3.6 3.6 3.5    CBC  Recent Labs Lab 03/26/16 0447 03/27/16 0445 03/28/16 0500  WBC 6.4 8.3 10.2  HGB 12.1* 12.5* 11.9*  HCT 39.4 40.3 38.9*  PLT 154 151 159   Coag's  Recent Labs Lab 03/24/16 0340  03/25/16 0330 03/26/16 0447 03/27/16 0445 03/27/16 1300  APTT 73* 65* 54* 46* 50*  INR 2.31 1.99 1.62  --   --     Sepsis Markers No results for input(s): LATICACIDVEN, PROCALCITON, O2SATVEN in the last 168 hours. ABG  Recent Labs Lab 03/23/16 0436 03/24/16 0501 03/25/16 0400  PHART 7.474* 7.391 7.417  PCO2ART 27.9* 36.3 34.8*  PO2ART 136* 141* 102*   Liver Enzymes  Recent Labs Lab 03/26/16 0447 03/27/16 0445 03/28/16 0500  AST 23 27 41  ALT 18 18 25  ALKPHOS 59 54 55  BILITOT 0.7 0.9 0.7  ALBUMIN 2.3* 2.3* 2.1*   Cardiac Enzymes No results for input(s): TROPONINI, PROBNP in the last 168 hours. Glucose  Recent Labs Lab 03/27/16 1138 03/27/16 1603 03/27/16 2135 03/27/16 2359 03/28/16 0348 03/28/16 0811  GLUCAP 201* 161* 157* 162* 157* 156*   Imaging No results found. STUDIES:  PFT 12/12/15: FVC 3.06 L (68%) FEV1 2.11 L (61%) FEV1/FVC 0.69 positive bronchodilator response TLC 4.61 L (72%) RV 77% ERV 78% DLCO uncorrected 72% TTE 02/11/16: LV severely dilated. EF 15-20% w/ diffuse hypokinesis. Grade 3 diastolic dysfunction. RV mildly dilated with normal systolic function. Moderate mitral regurgitation. No aortic stenosis or regurgitation. Port CXR 8/18: Endotracheal tube in acceptable position. Suggestion of cardiomegaly. Left lower lobe opacity as well as right midlung   opacity with air bronchograms suggesting consolidation. EEG 8/18 >> no over seizure activity, generalized low voltage and irregular delta activity Head CT scan 8/18 >> poor gray-white differentiation throughout hemispheres, R occipital hypo-attenuation could represent chronic or sub-acute infarct L heart cath 8/18 >> no significant change from prior study 11/2015  MICROBIOLOGY: MRSA PCR 8/18 >> Tracheal Asp Ctx 8/18 >> neg  Blood Ctx x2 8/18 >>ng  ANTIBIOTICS: Unasyn 8/18 >>8/25  SIGNIFICANT EVENTS: 8/18 - Admit post arrest w/ cooling initiated & LHC 8/25- df family meeting, plan is  support max wed then dc vent and comfort if not improved  LINES/TUBES: OETT 7.0 8/18>>> Foley 8/18>>> R IJ TLC 8/18>>> PIV x2>>>  DISCUSSION:  55 year old male with known DM, HTN, DLD, & Systolic CHF (EF 09-32%). VF arrest w/ 12 min until ROSC. Initiated therapeutic hypothermia. Neurological prognosis poor based on myoclonus and abnormal CT head. We had long d/w wife. We will try to meet w/ her and the sons/brother. It is our opinion that he at best would be looking at PVS and trach/PEG. He can breath spontaneously and family is hopeful for a miracle. The wife does acknowledge that "god will take him when he is ready". Perhaps the best option will be to determine a finite amount of time to give the family a chance to see if things can get better and if they do not we extubate and just continue supportive care. We will present this to the sons when they arrive as well.   ASSESSMENT / PLAN:  CARDIOVASCULAR A:  S/P VF Arrest - 12 min downtime. Witnessed & CPR initiated by son. Shock - Likely due to medication/sedation +/- cardiogenic H/O NSTEMI/CAD H/O Systolic CHF H/O AICD Placement H/O HTN & DLD  P:  Vitals per unit protocol. Continuous telemetry monitoring. Digoxin- dc, HR 60  NEUROLOGIC A:   Acute Encephalopathy - Likely from anoxia. No reported purposeful movements post arrest or currently off sedation -S/p 33C Hypothermia Protocol  Myoclonus, due to anoxic injury. Unlikely to be seizures Severe mycolonus P:   RASS goal: 0. keppra depakote increase as clinically sub there,(level reassuring) Requires versed drip, may be able to reduce in am with above increase   PULMONARY A: Acute Hypoxic Respiratory Failure - Post arrest. Probable Aspiration - R Lung on CXR. Moderate COPD - Based on pre-bronchod spiro May 2017. Mild Restrictive Lung Disease - Based on lung vol May 2017. Likely due to cardiomyopathy.  P:   PS cpap 5/10, consider diuresis if fails continued vent support  futile, trach futile- will NOT be offerred  RENAL A:   Acute Renal Failure-->resolved  P:   If fails wenaing, add lasix, assess chem  GASTROINTESTINAL A:   Transaminitis - Mild. Likely due to shock.  P:   Ct TF per nutrition. Pepcid IV q12hr Having BM  HEMATOLOGIC A:   Coagulopathy on Systemic Anticoagulation - Coumadin as outpatient. INR 1.9 on arrival. Leukocytosis - Likely reactive from arrest. NO heparin (refused per pt religious reasons) P:  scd  INFECTIOUS A:   Probable Aspiration Pneumonia vs Pneumonitis  P:   S/p ABX course Follow temp curve  ENDOCRINE A:   H/O DM Type 2    P:   SSI Accu-Checks q4hr. NICE goals met  FAMILY  - Updates: 8/26- family meeting goals by DF, goal is meet Monday and dc vent no later wed, we pccm will NOT PEG or trach him, will be futile and cause suffering Updated wife 8/27  -  Inter-disciplinary family meet or Palliative Care meeting due by:  Arranged for 8/25, wife & sons asked to choose a time in am   Ccm time 30 min   Daniel J. Feinstein, MD, FACP Pgr: [phone] [facility] 03/28/2016 10:43 AM   

## 2016-03-29 ENCOUNTER — Other Ambulatory Visit: Payer: Self-pay | Admitting: Family Medicine

## 2016-04-02 ENCOUNTER — Telehealth: Payer: Self-pay

## 2016-04-02 NOTE — Progress Notes (Signed)
Family arrived around 5am and per their request, medical equipment removed and disposed of. RN will continue to monitor.

## 2016-04-02 NOTE — Telephone Encounter (Signed)
On 04/02/2016 I received a death certificate from [facility] (original). The death certificate is for burial. The patient is a patient of Doctor Tyson Alias. The death certificate will be taken to [facility] ([id number]) this am for signature. On 2016/04/09 I received the death certificate back from Doctor Tyson Alias. I got the death certificate ready and mailed the death certificate to the [facility] Department per the funeral home request.

## 2016-04-02 NOTE — Progress Notes (Signed)
CDS referral completed. Bedside RN spoke with son of patient and son stated that he would let nursing staff know which funeral home to release patient to. Will continue to provide comfort and support to patients family.

## 2016-04-02 NOTE — Progress Notes (Signed)
RN and colleague in patients room giving am bath. RN noted after patients back was washed and bottom sheets changed that ventilator was alarming. RN noted that ventilator volumes were not being held. ET assessed at 24cm lip, 2cm further out than previous assessment. RN attempted to advance ET without success. [facility] alerted via in room red button and RT called from patient's room next door. RT attempted to advance ET without success, ET removed at that time and patient bagged via mask. Oral airway inserted. [facility] physician called Joneen Roach, NP and patient was re intubated at bedside. At that time patient was noted to be pulsless with AICD firing at 60bpm. In accordance with established DNR, other resuscitation efforts were held. At that time his AICD began to defibrillate him. Magnet was then placed over his device. TOD 0400 per NP. Son [place] notified via telephone by NP.

## 2016-04-02 NOTE — Significant Event (Addendum)
PCCM INTERVAL PROGRESS NOTE  Called to bedside by [facility] MD to evaluate patient after he was accidentally extubated during routine care. Upon my arrival to bedside patient was being bagged by RT and he was unresponsive, which has been typical of his exam over the course of his admission. He was in paced rhythm at 60. I promptly re-intubated the patient on first attempt with positive color change on etco2. I was able to visualize ETT passing through vocal cords via glidescope. I exited the room to place order for chest x ray and nursing staff called out to tell me that patient was pulseless. Remained with same paced rhythm on monitor. In accordance with established DNR order cardiopulmonary resuscitation efforts were held. His AICD began to defibrillate him. Magnet was placed over device. TOD 0400. Son notified via telephone.    Joneen Roach, AGACNP-BC [facility] Pulmonology/Critical Care Pager [phone] or [phone]  03/02/2016 4:14 AM

## 2016-04-02 DEATH — deceased

## 2016-05-02 NOTE — Discharge Summary (Signed)
NAMEJARID, GARRETSON NO.:  [id number]  MEDICAL RECORD NO.:  [id number]  LOCATION:  2H08C                        FACILITY:  [facility]  PHYSICIAN:  Nelda Bucks, MD DATE OF BIRTH:  [birth year]  DATE OF ADMISSION:  26-Mar-2016 DATE OF DISCHARGE:  03/24/2016                              DISCHARGE SUMMARY   DEATH SUMMARY  HOSPITAL COURSE:  This is a 55 year old male status post VFib/VT arrest with known cardiac disease, seen by Cardiology outpatient.  Patient suffered a cardiac arrest with return of spontaneous circulation; had been in VT/VFib for an estimated 12 minutes.  He was cooled with hypothermia protocol, had an emergent cardiac catheterization which showed stable coronary anatomy compared to post PCI on 04/17, was felt primarily that it was cardiac arrest with arrhythmia in nature secondary to severe cardiomyopathy.  Unfortunately, the patient had severe anoxic brain injury with horrible debilitating refractory myoclonus.  The patient was aggressively treated initially with central line placement. History of non STEMI with coronary artery disease, history of systolic heart failure, history of AICD placement, hypertension.  Cardiology was following.  The patient initially was treated with digoxin and was discontinued.  There was mild restrictive lung disease based on some lung volumes in May 2017, likely due to his cardiomyopathy and moderate COPD, probably aspirated with pneumonia versus pneumonitis and has full course of antibiotics.  The patient's neurologic status did not recover whatsoever.  Family discussion was held with his wife and his entire family with regard to consideration for comfort care.  They essentially were not ready and he was a limited code unfortunately. The patient had no purposeful movement.  He had severe anoxia and despite support, the patient's heart stopped on the ventilator machine and expired.  FINAL DIAGNOSIS UPON DEATH: 1.  Severe anoxic brain injury, status post cardiac arrest. 2. Severe myoclonus secondary to severe anoxic brain injury. 3. Status post cardiac arrest secondary to primary arrhythmia     secondary to heart failure.     Nelda Bucks, MD     DJF/MEDQ  D:  04/06/2016  T:  04/07/2016  Job:  [id number]

## 2017-10-15 IMAGING — US US ABDOMEN LIMITED
1 series · 14 of 25 positions shown · non-contrast
Comparison: 08/10/2014

CLINICAL DATA: Right upper quadrant pain for 1 day

EXAM:
US ABDOMEN LIMITED - RIGHT UPPER QUADRANT

[Series 1: us abdomen limited · 0.24mm/px · 14 of 53 slices shown]
[im 1/53]
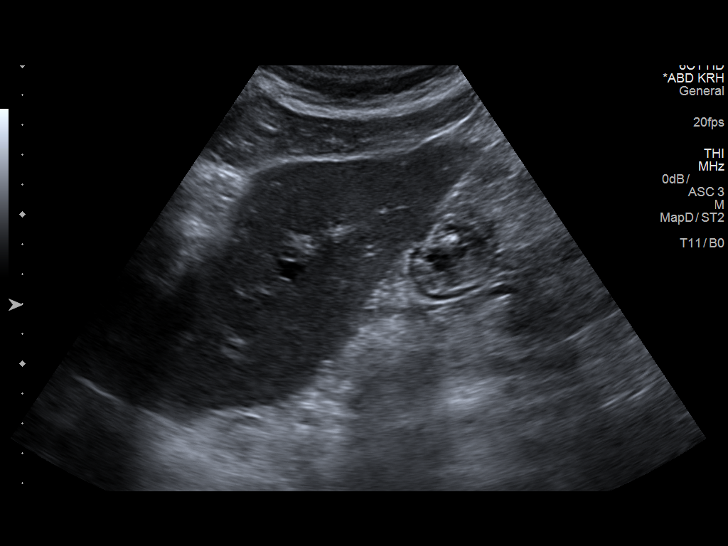
[im 5/53]
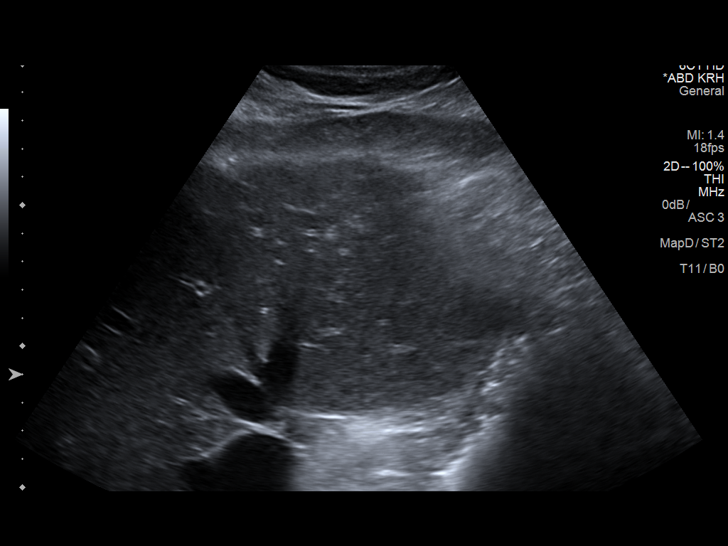
[im 9/53]
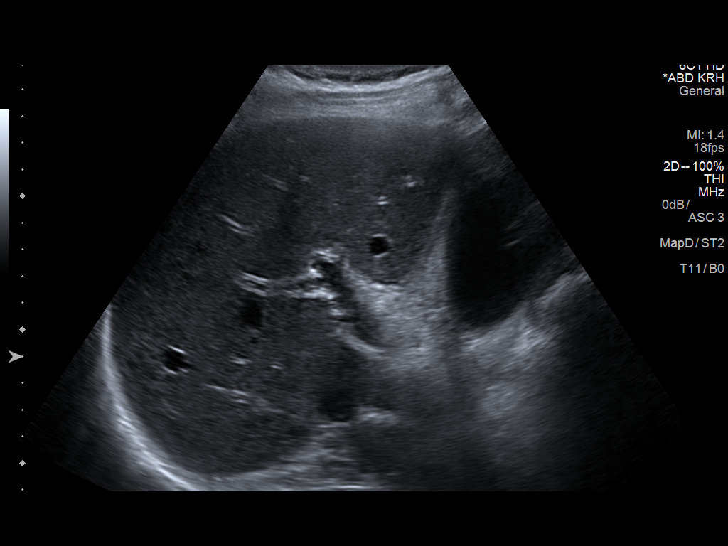
[im 14/53]
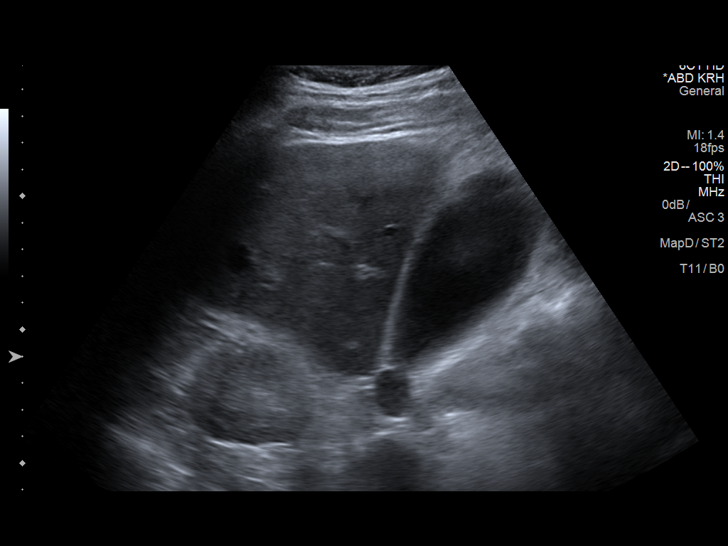
[im 18/53]
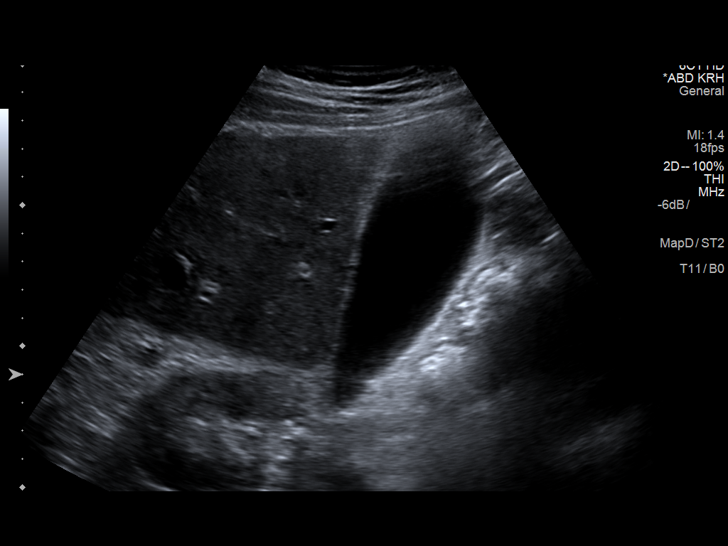
[im 20/53]
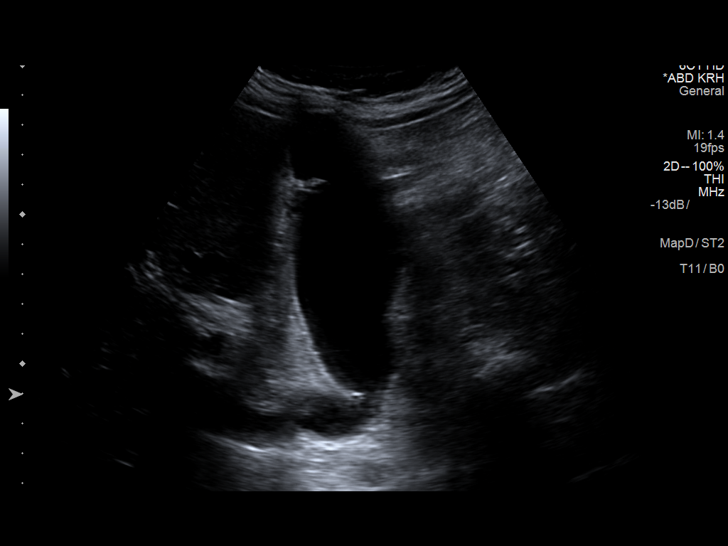
[im 24/53]
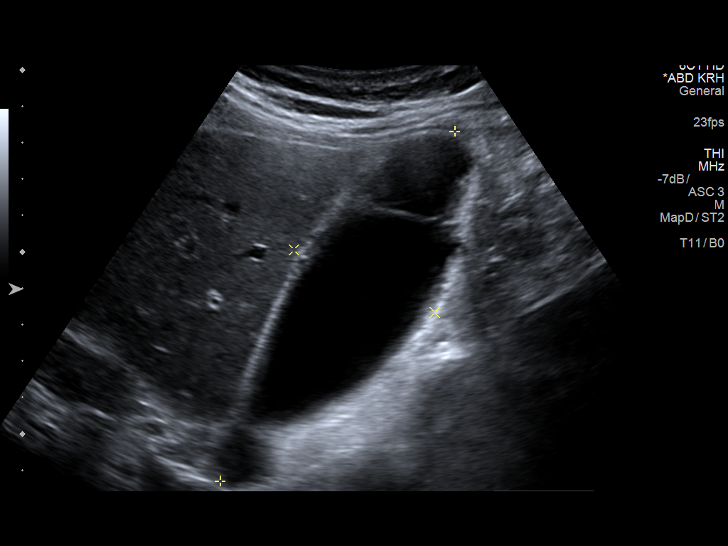
[im 29/53]
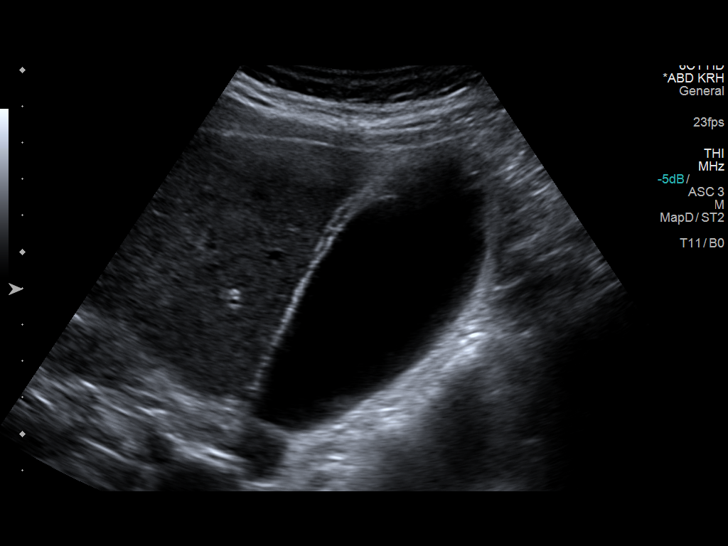
[im 33/53]
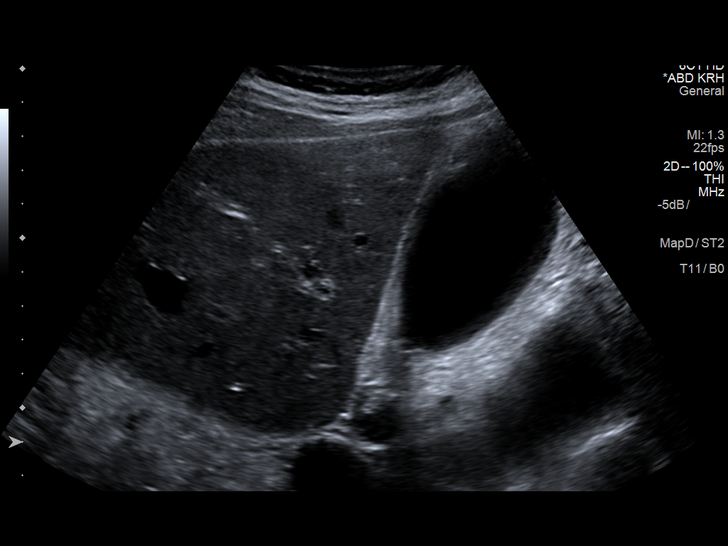
[im 35/53]
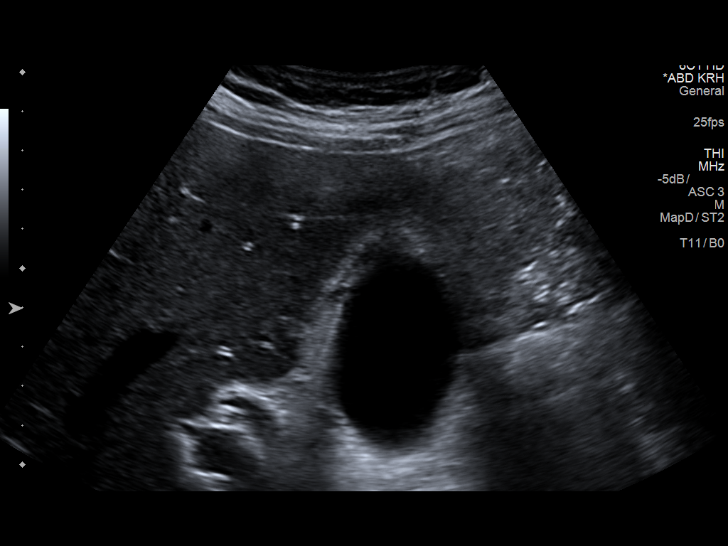
[im 40/53]
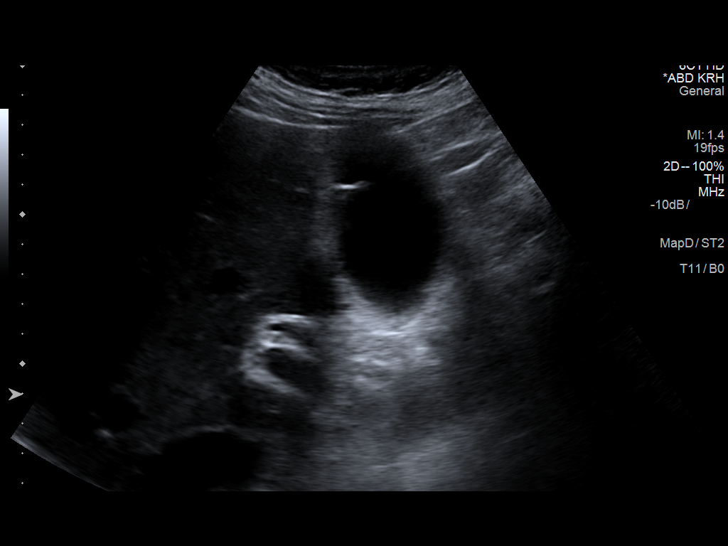
[im 44/53]
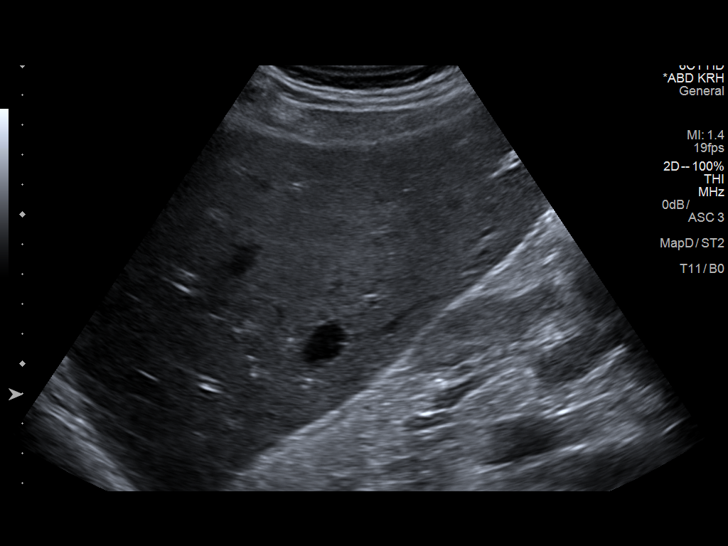
[im 48/53]
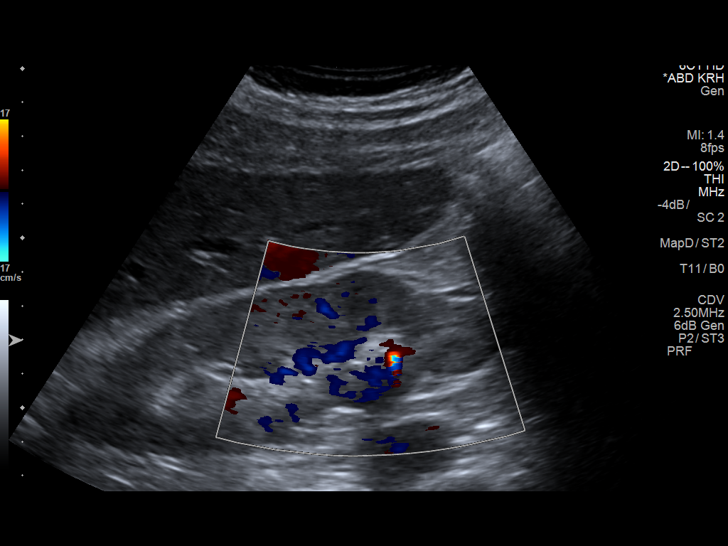
[im 53/53]
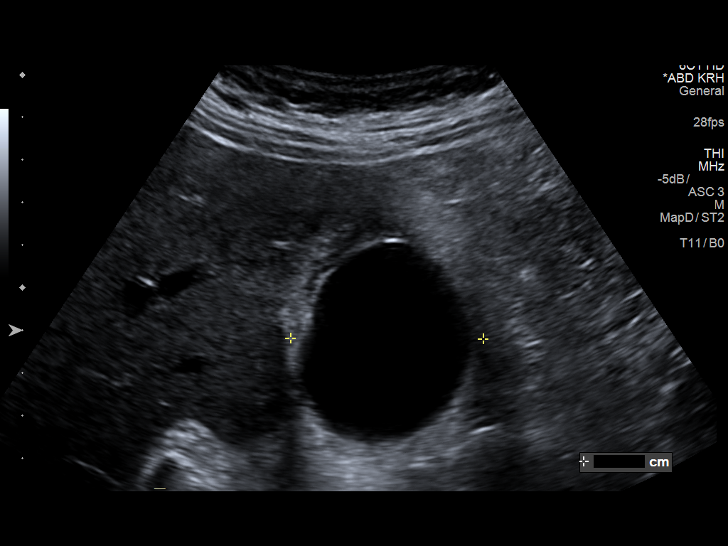

[14 of 25 positions shown; findings below may reference images not displayed]

FINDINGS: Gallbladder:

Stable gallstone is again identified. Mild wall thickening is noted
to 4 mm. A positive sonographic Murphy sign is seen.

Common bile duct:

Diameter: 4 mm

Liver:

No focal lesion identified. Within normal limits in parenchymal
echogenicity.

Incidental note is again made of a right renal calculus measuring 9
mm. It is nonobstructive in nature.
IMPRESSION: Cholelithiasis with positive sonographic Murphy's sign and mild wall
thickening.

Right renal stone.

## 2017-10-23 IMAGING — CR DG CHEST 2V
2 series · 2 of 2 positions shown · non-contrast
Comparison: 07/14/2015

CLINICAL DATA: Preop for cholelithiasis

EXAM:
CHEST  2 VIEW

[w chest pa]
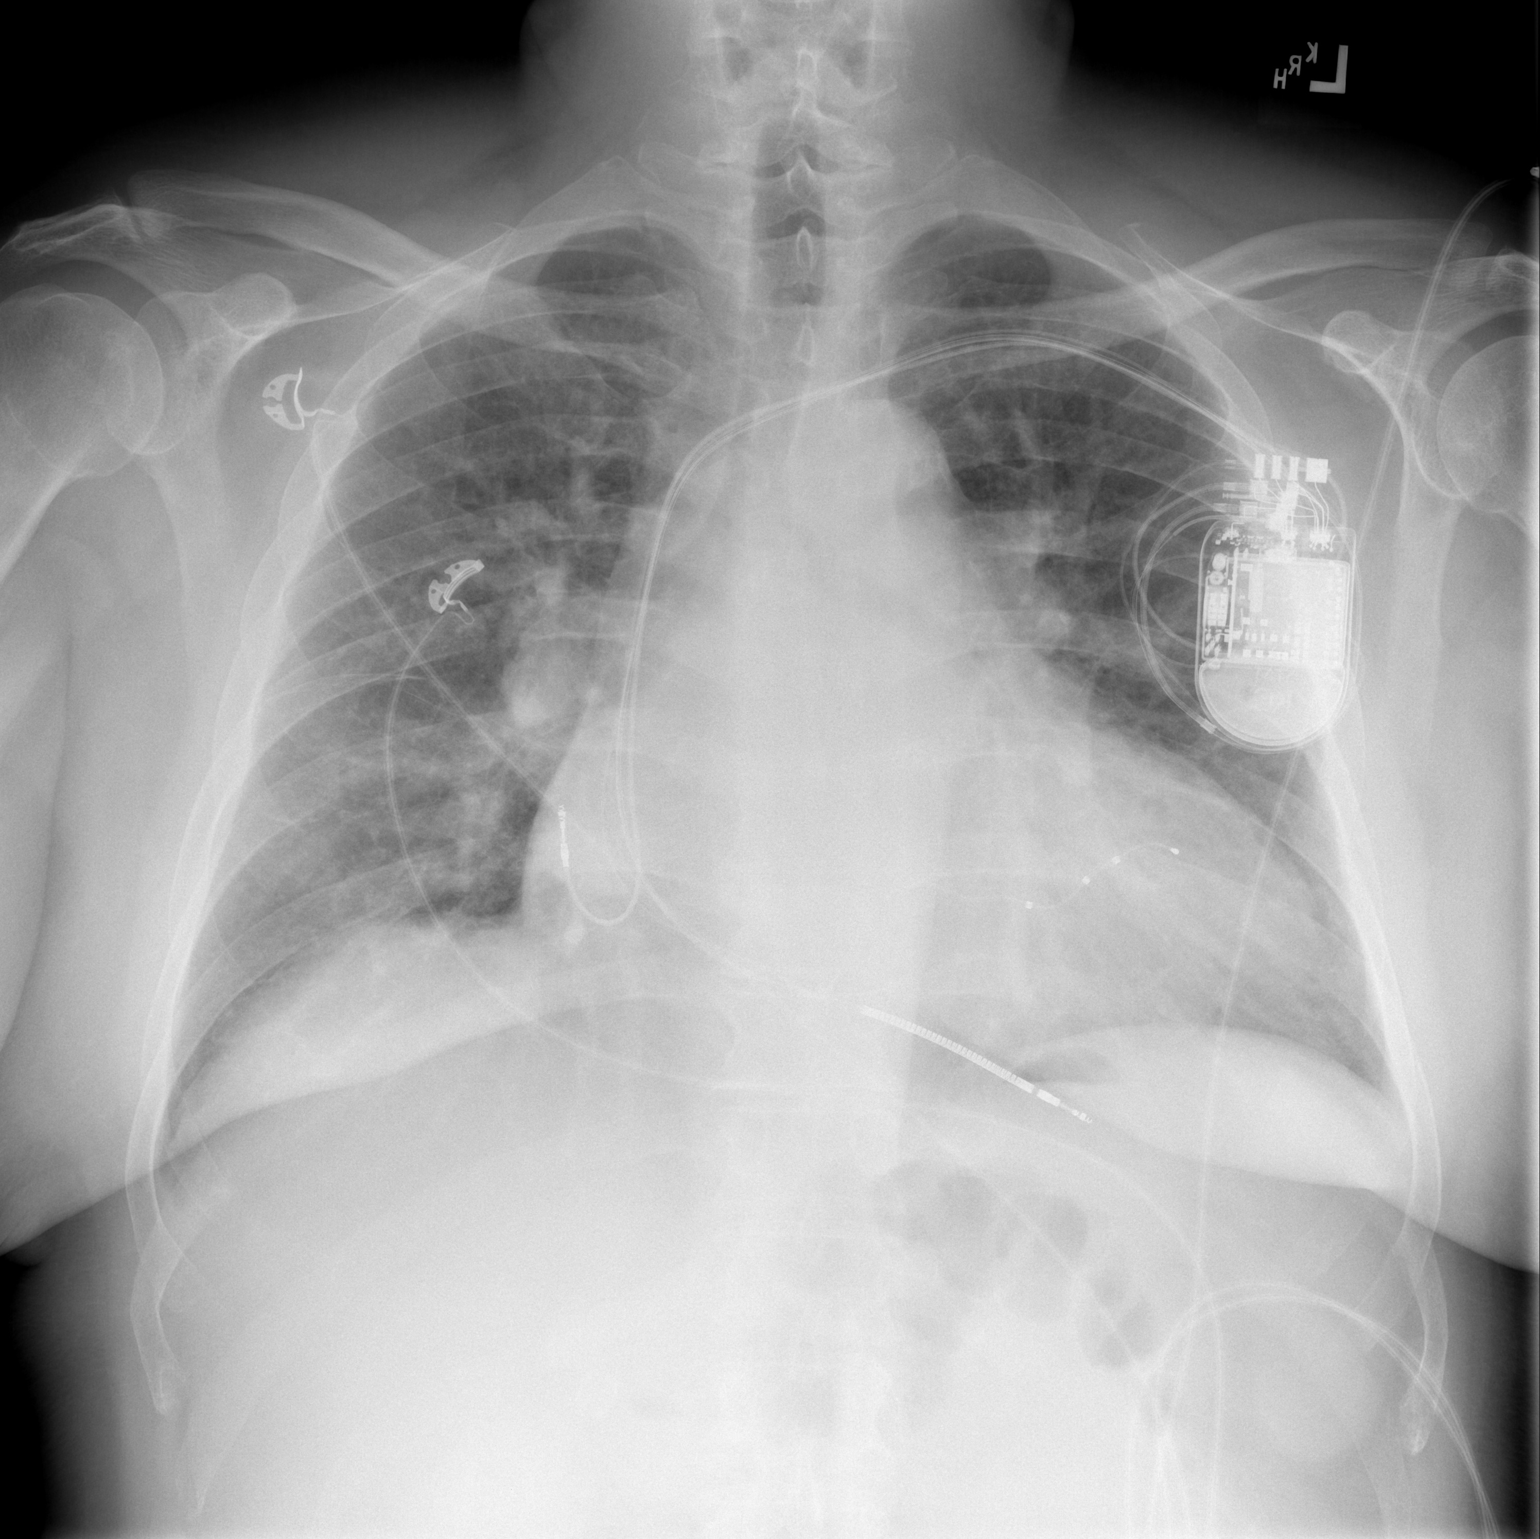

[w chest lat]
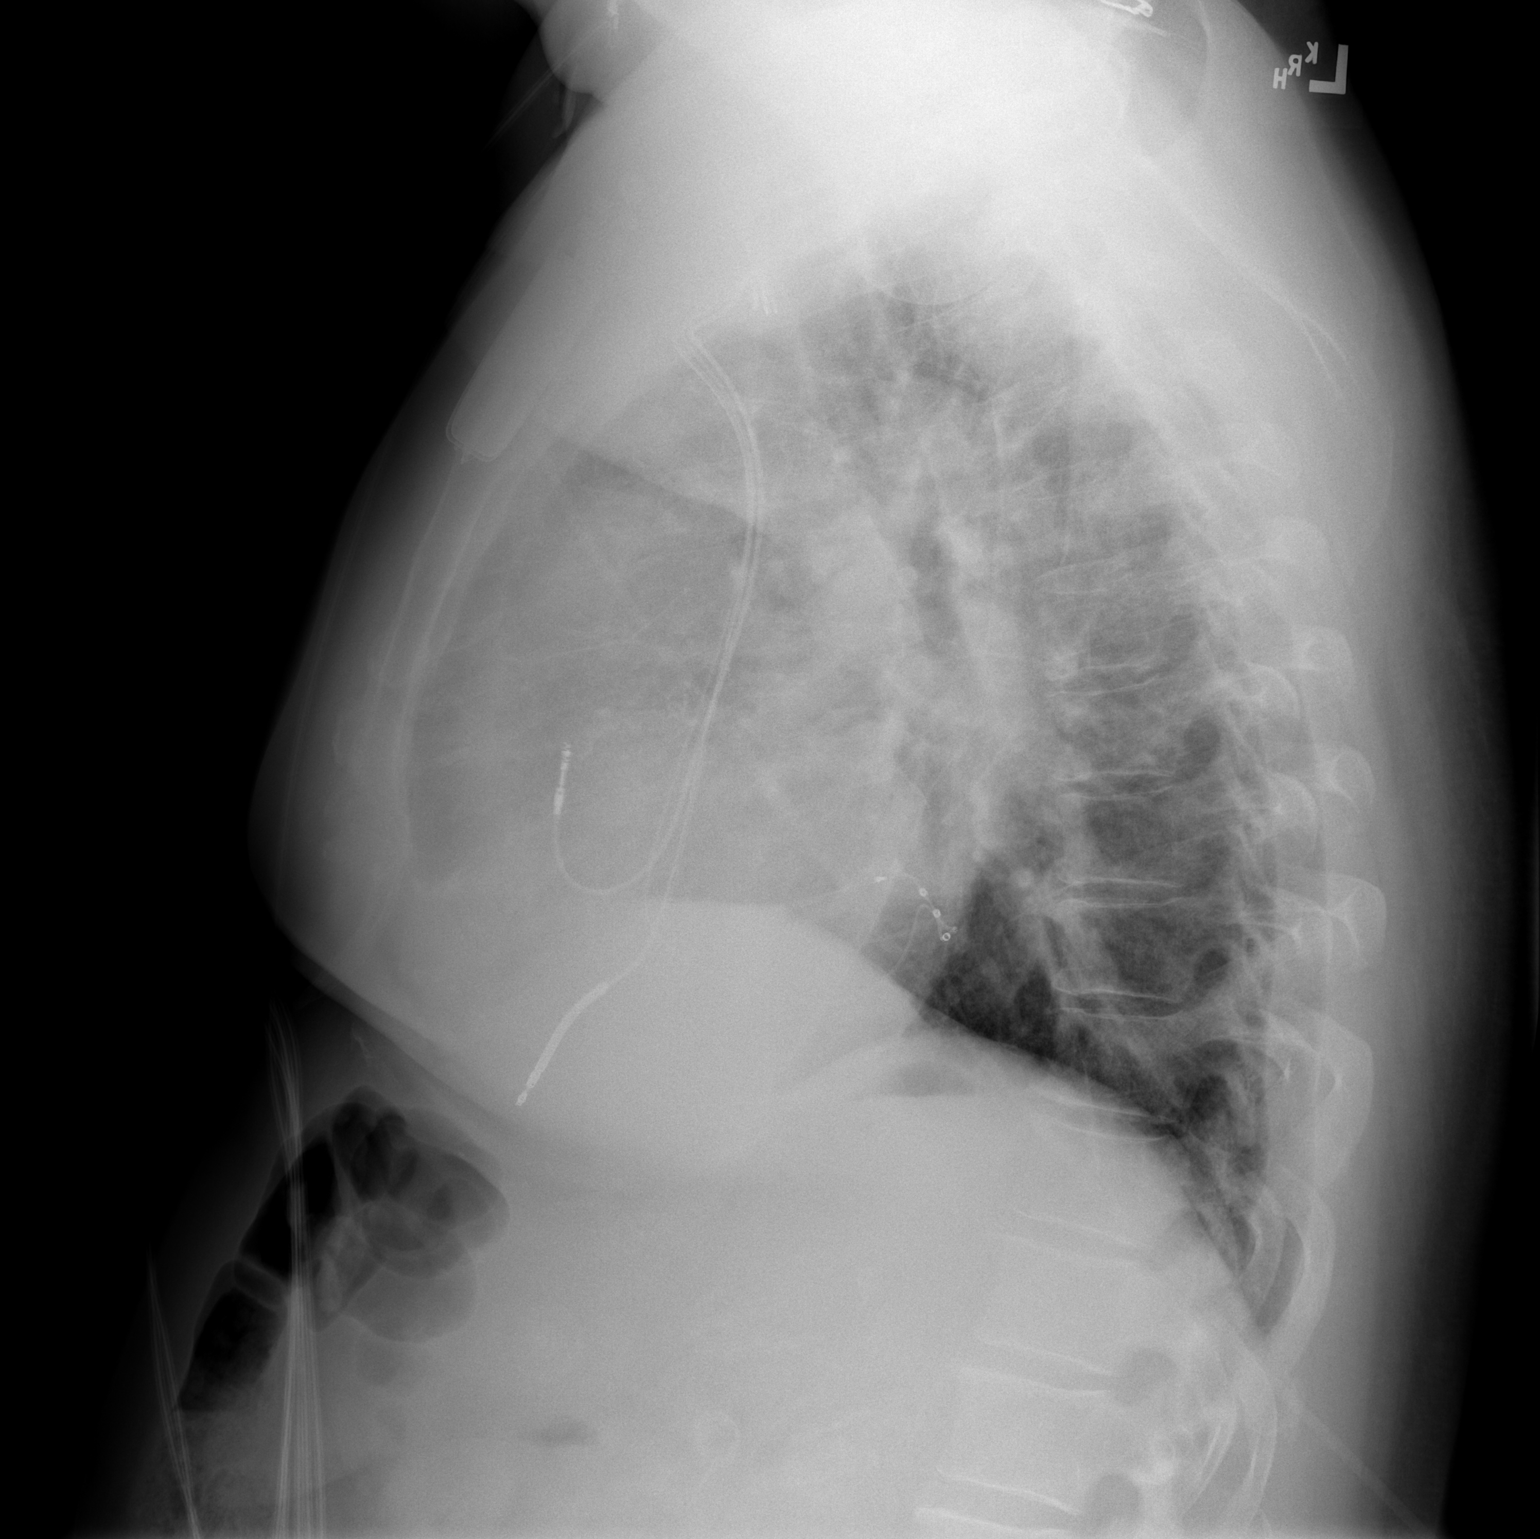

[2 of 2 positions shown; findings below may reference images not displayed]

FINDINGS: Cardiomegaly again noted. Central mild vascular congestion without
convincing pulmonary edema. 3 leads cardiac pacemaker is unchanged
in position. Mild basilar atelectasis. No segmental infiltrate.
IMPRESSION: Cardiomegaly. Three leads cardiac pacemaker in place. Central mild
vascular congestion without pulmonary edema. Mild basilar
atelectasis.

## 2018-01-29 IMAGING — CR DG CHEST 2V
2 series · 2 of 2 positions shown · non-contrast
Comparison: Chest radiograph performed 11/15/2015

CLINICAL DATA: Acute onset of shortness of breath and weight gain.
Lower extremity swelling. Initial encounter.

EXAM:
CHEST  2 VIEW

[w chest pa]
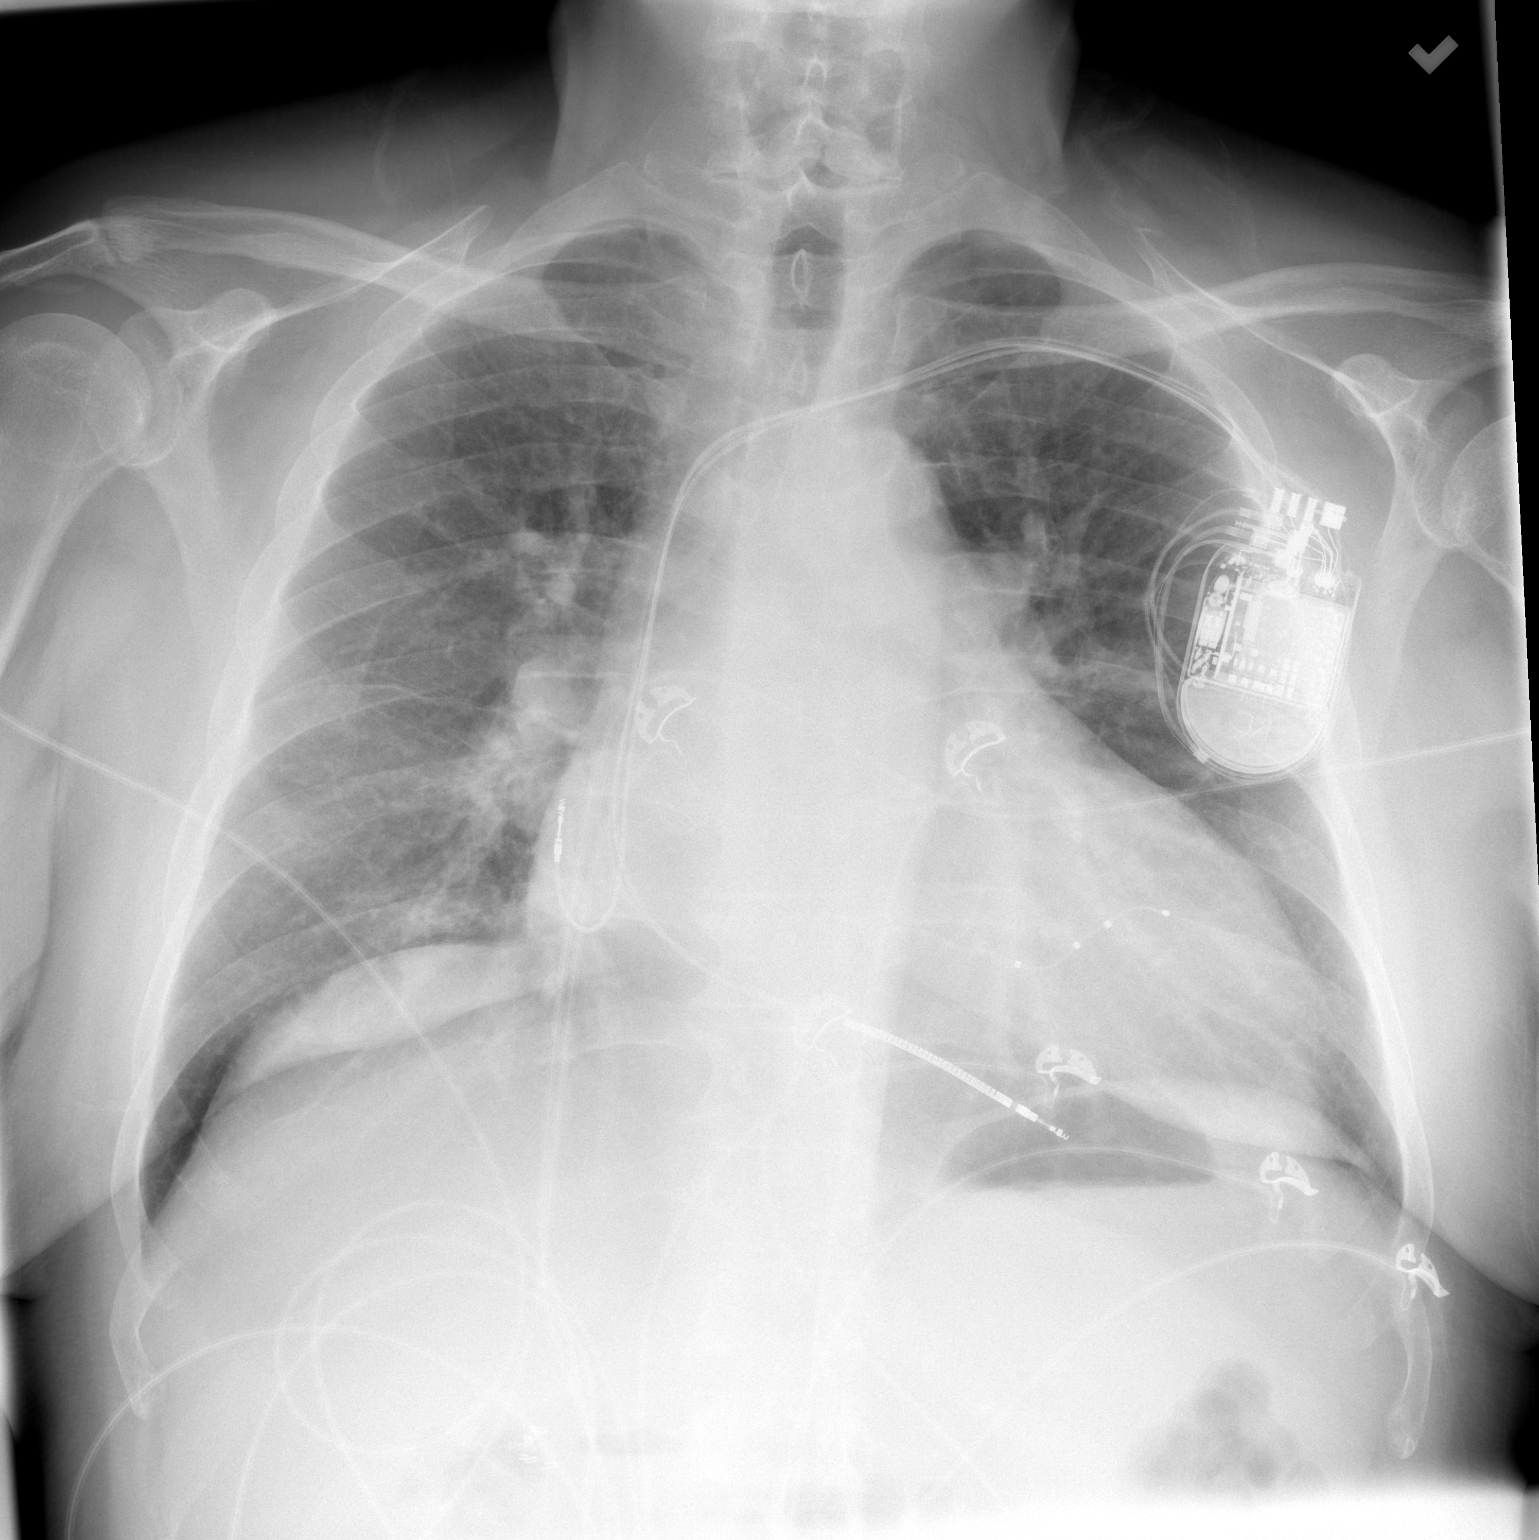

[w chest lat]
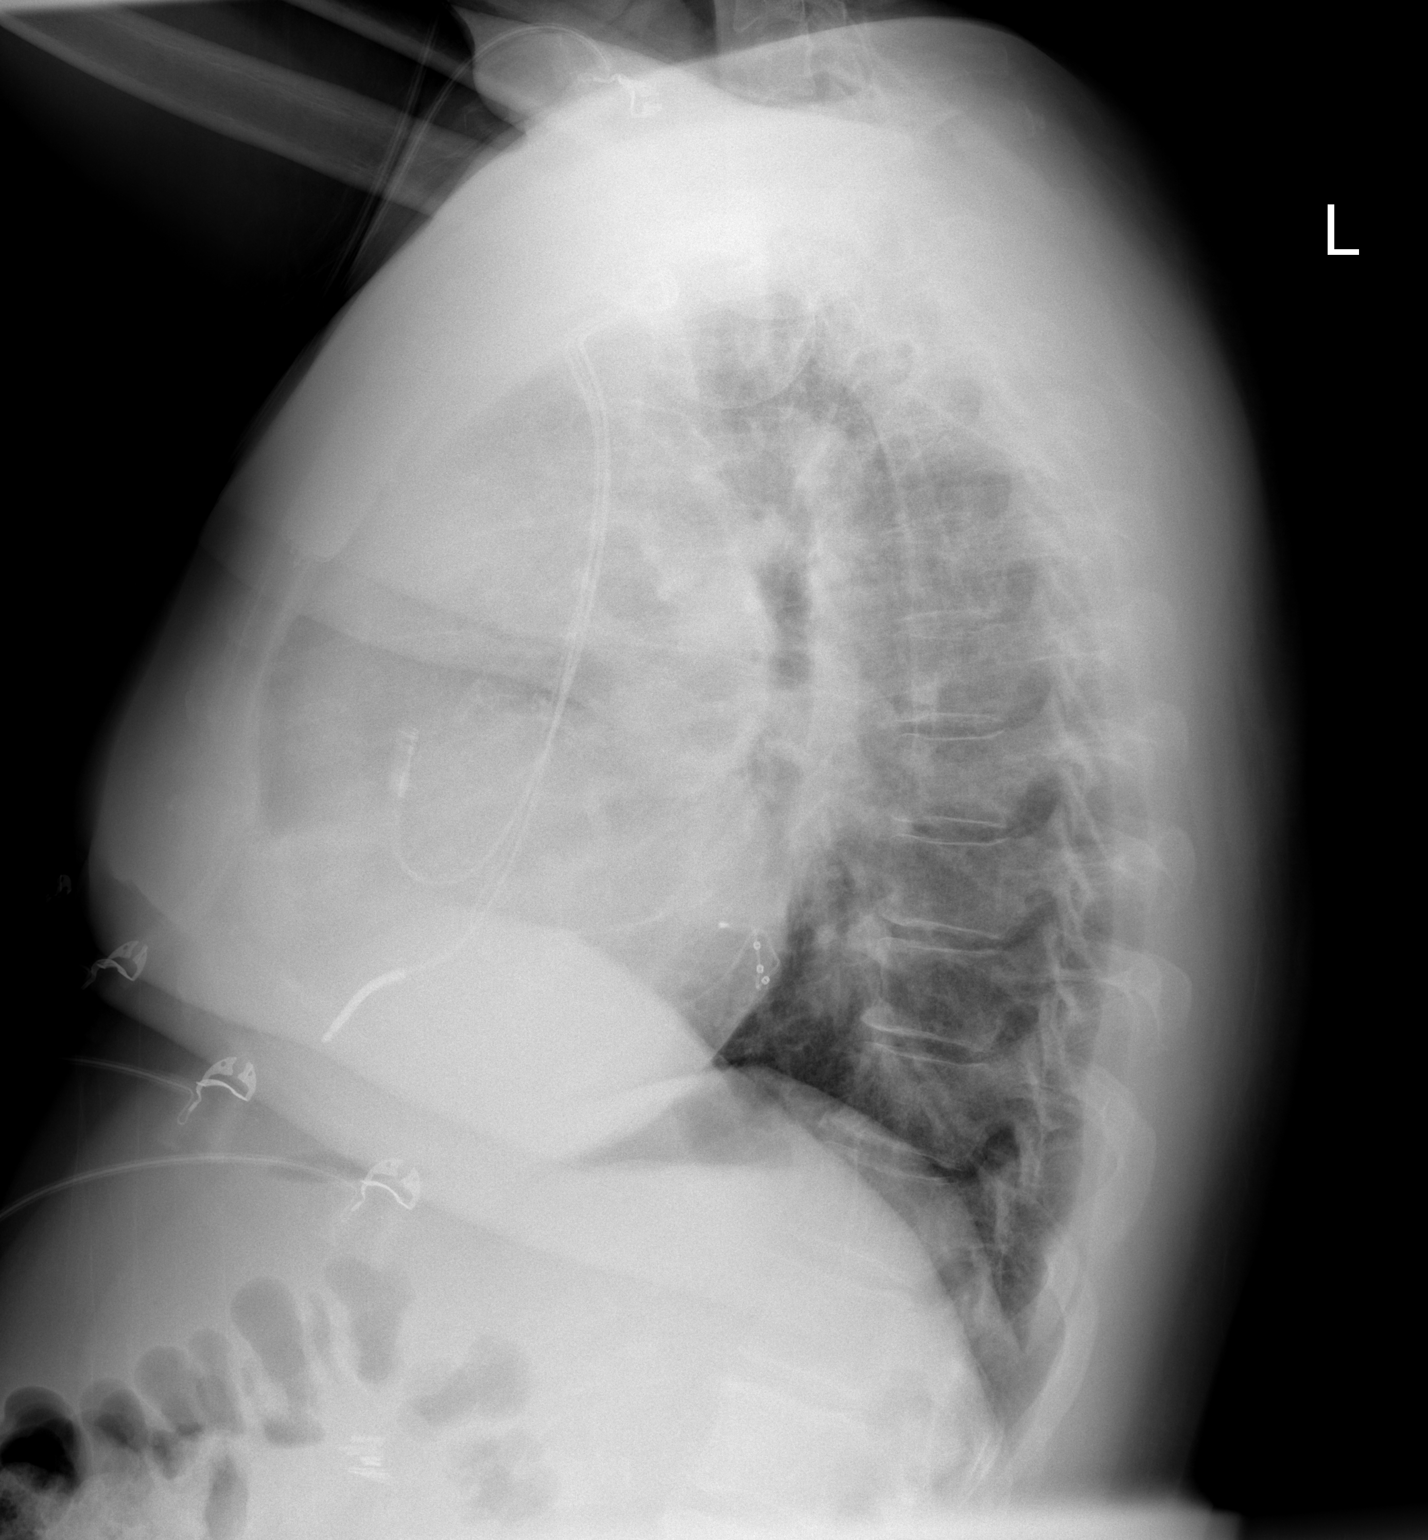

[2 of 2 positions shown; findings below may reference images not displayed]

FINDINGS: The lungs are well-aerated. Vascular congestion is noted. Mildly
increased interstitial markings could reflect minimal interstitial
edema. There is no evidence of pleural effusion or pneumothorax.

The heart is enlarged. A pacemaker/AICD is noted at the left chest
wall, with leads ending at the right atrium, right ventricle and
coronary sinus. No acute osseous abnormalities are seen. Clips are
noted within the right upper quadrant, reflecting prior
cholecystectomy.
IMPRESSION: Vascular congestion and cardiomegaly. Mildly increased interstitial
markings could reflect minimal interstitial edema.
